# Patient Record
Sex: Female | Born: 1940
Health system: Southern US, Community
[De-identification: ages and names within clinical notes are randomized; demographics above are authoritative.]

## PROBLEM LIST (undated history)

## (undated) DIAGNOSIS — C801 Malignant (primary) neoplasm, unspecified: Secondary | ICD-10-CM

## (undated) DIAGNOSIS — E039 Hypothyroidism, unspecified: Secondary | ICD-10-CM

## (undated) DIAGNOSIS — G2581 Restless legs syndrome: Secondary | ICD-10-CM

## (undated) DIAGNOSIS — K219 Gastro-esophageal reflux disease without esophagitis: Secondary | ICD-10-CM

## (undated) DIAGNOSIS — E785 Hyperlipidemia, unspecified: Secondary | ICD-10-CM

## (undated) DIAGNOSIS — T7840XA Allergy, unspecified, initial encounter: Secondary | ICD-10-CM

## (undated) DIAGNOSIS — M722 Plantar fascial fibromatosis: Secondary | ICD-10-CM

## (undated) DIAGNOSIS — I10 Essential (primary) hypertension: Secondary | ICD-10-CM

## (undated) DIAGNOSIS — F411 Generalized anxiety disorder: Secondary | ICD-10-CM

## (undated) HISTORY — DX: Malignant (primary) neoplasm, unspecified: C80.1

## (undated) HISTORY — DX: Hyperlipidemia, unspecified: E78.5

## (undated) HISTORY — PX: OTHER SURGICAL HISTORY: SHX169

## (undated) HISTORY — DX: Generalized anxiety disorder: F41.1

## (undated) HISTORY — DX: Hypothyroidism, unspecified: E03.9

## (undated) HISTORY — DX: Allergy, unspecified, initial encounter: T78.40XA

## (undated) HISTORY — PX: WISDOM TOOTH EXTRACTION: SHX21

## (undated) HISTORY — DX: Gastro-esophageal reflux disease without esophagitis: K21.9

## (undated) HISTORY — DX: Plantar fascial fibromatosis: M72.2

## (undated) HISTORY — PX: ABDOMINAL HYSTERECTOMY: SHX81

## (undated) HISTORY — DX: Essential (primary) hypertension: I10

## (undated) HISTORY — DX: Restless legs syndrome: G25.81

## (undated) HISTORY — PX: REDUCTION MAMMAPLASTY: SUR839

## (undated) HISTORY — PX: BREAST SURGERY: SHX581

---

## 2003-12-12 ENCOUNTER — Ambulatory Visit (HOSPITAL_COMMUNITY): Admission: RE | Admit: 2003-12-12 | Discharge: 2003-12-12 | Payer: Self-pay | Admitting: Internal Medicine

## 2004-04-20 ENCOUNTER — Encounter: Admission: RE | Admit: 2004-04-20 | Discharge: 2004-04-20 | Payer: Self-pay | Admitting: Family Medicine

## 2004-05-07 ENCOUNTER — Ambulatory Visit: Payer: Self-pay | Admitting: Family Medicine

## 2004-11-03 ENCOUNTER — Ambulatory Visit: Payer: Self-pay | Admitting: Family Medicine

## 2005-03-09 ENCOUNTER — Ambulatory Visit: Payer: Self-pay | Admitting: Family Medicine

## 2005-03-09 ENCOUNTER — Ambulatory Visit: Payer: Self-pay | Admitting: Gastroenterology

## 2005-03-22 ENCOUNTER — Ambulatory Visit: Payer: Self-pay | Admitting: Gastroenterology

## 2005-03-30 ENCOUNTER — Ambulatory Visit: Payer: Self-pay | Admitting: Family Medicine

## 2005-08-26 ENCOUNTER — Ambulatory Visit: Payer: Self-pay | Admitting: Cardiology

## 2005-08-26 ENCOUNTER — Ambulatory Visit: Payer: Self-pay | Admitting: Family Medicine

## 2005-08-30 ENCOUNTER — Encounter: Admission: RE | Admit: 2005-08-30 | Discharge: 2005-08-30 | Payer: Self-pay | Admitting: Family Medicine

## 2005-09-01 ENCOUNTER — Ambulatory Visit: Payer: Self-pay | Admitting: Family Medicine

## 2005-11-12 ENCOUNTER — Encounter: Admission: RE | Admit: 2005-11-12 | Discharge: 2005-11-12 | Payer: Self-pay | Admitting: Family Medicine

## 2005-11-18 ENCOUNTER — Ambulatory Visit: Payer: Self-pay | Admitting: Family Medicine

## 2006-03-10 ENCOUNTER — Ambulatory Visit: Payer: Self-pay | Admitting: Family Medicine

## 2006-03-24 ENCOUNTER — Ambulatory Visit: Payer: Self-pay | Admitting: Family Medicine

## 2006-06-17 ENCOUNTER — Ambulatory Visit: Payer: Self-pay | Admitting: Family Medicine

## 2006-06-17 LAB — CONVERTED CEMR LAB
ALT: 35 units/L (ref 0–40)
AST: 31 units/L (ref 0–37)
Albumin: 4.2 g/dL (ref 3.5–5.2)
Alkaline Phosphatase: 49 units/L (ref 39–117)
BUN: 22 mg/dL (ref 6–23)
Basophils Absolute: 0.1 10*3/uL (ref 0.0–0.1)
Basophils Relative: 1.1 % — ABNORMAL HIGH (ref 0.0–1.0)
CO2: 26 meq/L (ref 19–32)
Calcium: 9.7 mg/dL (ref 8.4–10.5)
Chloride: 106 meq/L (ref 96–112)
Chol/HDL Ratio, serum: 3.3
Cholesterol: 161 mg/dL (ref 0–200)
Creatinine, Ser: 1.2 mg/dL (ref 0.4–1.2)
Eosinophil percent: 3.1 % (ref 0.0–5.0)
GFR calc non Af Amer: 48 mL/min
Glomerular Filtration Rate, Af Am: 58 mL/min/{1.73_m2}
Glucose, Bld: 109 mg/dL — ABNORMAL HIGH (ref 70–99)
HCT: 38.8 % (ref 36.0–46.0)
HDL: 48.5 mg/dL (ref >39.0)
Hemoglobin: 12.7 g/dL (ref 12.0–15.0)
LDL Cholesterol: 79 mg/dL (ref 0–99)
Lymphocytes Relative: 37.8 % (ref 12.0–46.0)
MCHC: 32.6 g/dL (ref 30.0–36.0)
MCV: 79.5 fL (ref 78.0–100.0)
Monocytes Absolute: 0.6 10*3/uL (ref 0.2–0.7)
Monocytes Relative: 11.1 % — ABNORMAL HIGH (ref 3.0–11.0)
Neutro Abs: 2.4 10*3/uL (ref 1.4–7.7)
Neutrophils Relative %: 46.9 % (ref 43.0–77.0)
Platelets: 261 10*3/uL (ref 150–400)
Potassium: 4.1 meq/L (ref 3.5–5.1)
RBC: 4.89 M/uL (ref 3.87–5.11)
RDW: 12.2 % (ref 11.5–14.6)
Sodium: 141 meq/L (ref 135–145)
TSH: 0.05 microintl units/mL — ABNORMAL LOW (ref 0.35–5.50)
Total Bilirubin: 0.8 mg/dL (ref 0.3–1.2)
Total Protein: 7.2 g/dL (ref 6.0–8.3)
Triglyceride fasting, serum: 169 mg/dL — ABNORMAL HIGH (ref 0–149)
VLDL: 34 mg/dL (ref 0–40)
WBC: 5.3 10*3/uL (ref 4.5–10.5)

## 2006-09-05 ENCOUNTER — Ambulatory Visit: Payer: Self-pay | Admitting: Family Medicine

## 2006-09-05 LAB — CONVERTED CEMR LAB
Free T4: 1 ng/dL (ref 0.6–1.6)
Hgb A1c MFr Bld: 5.6 % (ref 4.6–6.0)
T3, Free: 3.1 pg/mL (ref 2.3–4.2)
TSH: 0.12 microintl units/mL — ABNORMAL LOW (ref 0.35–5.50)

## 2006-11-14 ENCOUNTER — Encounter: Admission: RE | Admit: 2006-11-14 | Discharge: 2006-11-14 | Payer: Self-pay | Admitting: Family Medicine

## 2006-11-25 ENCOUNTER — Ambulatory Visit: Payer: Self-pay | Admitting: Family Medicine

## 2006-11-25 LAB — CONVERTED CEMR LAB: TSH: 0.4 microintl units/mL (ref 0.35–5.50)

## 2007-08-10 ENCOUNTER — Ambulatory Visit: Payer: Self-pay | Admitting: Family Medicine

## 2007-08-10 DIAGNOSIS — J45909 Unspecified asthma, uncomplicated: Secondary | ICD-10-CM | POA: Insufficient documentation

## 2007-08-10 DIAGNOSIS — F411 Generalized anxiety disorder: Secondary | ICD-10-CM | POA: Insufficient documentation

## 2007-08-10 DIAGNOSIS — E785 Hyperlipidemia, unspecified: Secondary | ICD-10-CM

## 2007-08-10 DIAGNOSIS — I10 Essential (primary) hypertension: Secondary | ICD-10-CM

## 2007-08-10 DIAGNOSIS — E039 Hypothyroidism, unspecified: Secondary | ICD-10-CM

## 2007-08-11 ENCOUNTER — Telehealth (INDEPENDENT_AMBULATORY_CARE_PROVIDER_SITE_OTHER): Payer: Self-pay | Admitting: *Deleted

## 2007-08-23 ENCOUNTER — Ambulatory Visit: Payer: Self-pay | Admitting: Family Medicine

## 2007-08-24 LAB — CONVERTED CEMR LAB
ALT: 26 units/L (ref 0–35)
AST: 24 units/L (ref 0–37)
Albumin: 4.1 g/dL (ref 3.5–5.2)
Alkaline Phosphatase: 48 units/L (ref 39–117)
BUN: 19 mg/dL (ref 6–23)
Basophils Absolute: 0 10*3/uL (ref 0.0–0.1)
Basophils Relative: 0.3 % (ref 0.0–1.0)
Bilirubin, Direct: 0.1 mg/dL (ref 0.0–0.3)
CO2: 30 meq/L (ref 19–32)
Calcium: 10.1 mg/dL (ref 8.4–10.5)
Chloride: 105 meq/L (ref 96–112)
Cholesterol: 152 mg/dL (ref 0–200)
Creatinine, Ser: 1.2 mg/dL (ref 0.4–1.2)
Eosinophils Absolute: 0.2 10*3/uL (ref 0.0–0.6)
Eosinophils Relative: 4.1 % (ref 0.0–5.0)
GFR calc Af Amer: 58 mL/min
GFR calc non Af Amer: 48 mL/min
Glucose, Bld: 91 mg/dL (ref 70–99)
HCT: 38.8 % (ref 36.0–46.0)
HDL: 44.1 mg/dL (ref >39.0)
Hemoglobin: 12.3 g/dL (ref 12.0–15.0)
LDL Cholesterol: 79 mg/dL (ref 0–99)
Lymphocytes Relative: 40.3 % (ref 12.0–46.0)
MCHC: 31.8 g/dL (ref 30.0–36.0)
MCV: 79.2 fL (ref 78.0–100.0)
Monocytes Absolute: 0.5 10*3/uL (ref 0.2–0.7)
Monocytes Relative: 10.3 % (ref 3.0–11.0)
Neutro Abs: 2.3 10*3/uL (ref 1.4–7.7)
Neutrophils Relative %: 45 % (ref 43.0–77.0)
Platelets: 232 10*3/uL (ref 150–400)
Potassium: 4.2 meq/L (ref 3.5–5.1)
RBC: 4.89 M/uL (ref 3.87–5.11)
RDW: 12 % (ref 11.5–14.6)
Sodium: 141 meq/L (ref 135–145)
TSH: 0.09 microintl units/mL — ABNORMAL LOW (ref 0.35–5.50)
Total Bilirubin: 0.8 mg/dL (ref 0.3–1.2)
Total CHOL/HDL Ratio: 3.4
Total Protein: 7.2 g/dL (ref 6.0–8.3)
Triglycerides: 147 mg/dL (ref 0–149)
VLDL: 29 mg/dL (ref 0–40)
WBC: 5.1 10*3/uL (ref 4.5–10.5)

## 2007-09-22 ENCOUNTER — Telehealth (INDEPENDENT_AMBULATORY_CARE_PROVIDER_SITE_OTHER): Payer: Self-pay | Admitting: *Deleted

## 2007-09-22 ENCOUNTER — Encounter (INDEPENDENT_AMBULATORY_CARE_PROVIDER_SITE_OTHER): Payer: Self-pay | Admitting: *Deleted

## 2007-09-28 ENCOUNTER — Ambulatory Visit: Payer: Self-pay | Admitting: Family Medicine

## 2007-09-28 DIAGNOSIS — G2581 Restless legs syndrome: Secondary | ICD-10-CM

## 2007-09-28 DIAGNOSIS — R002 Palpitations: Secondary | ICD-10-CM | POA: Insufficient documentation

## 2007-09-28 DIAGNOSIS — G4700 Insomnia, unspecified: Secondary | ICD-10-CM | POA: Insufficient documentation

## 2007-09-29 LAB — CONVERTED CEMR LAB: Vit D, 1,25-Dihydroxy: 40 (ref 30–89)

## 2007-10-01 LAB — CONVERTED CEMR LAB
BUN: 13 mg/dL (ref 6–23)
Basophils Absolute: 0.1 10*3/uL (ref 0.0–0.1)
Basophils Relative: 0.9 % (ref 0.0–1.0)
CO2: 27 meq/L (ref 19–32)
Calcium: 9.5 mg/dL (ref 8.4–10.5)
Chloride: 105 meq/L (ref 96–112)
Creatinine, Ser: 1.2 mg/dL (ref 0.4–1.2)
Eosinophils Absolute: 0.2 10*3/uL (ref 0.0–0.6)
Eosinophils Relative: 4 % (ref 0.0–5.0)
GFR calc Af Amer: 58 mL/min
GFR calc non Af Amer: 48 mL/min
Glucose, Bld: 92 mg/dL (ref 70–99)
HCT: 40.7 % (ref 36.0–46.0)
Hemoglobin: 13.3 g/dL (ref 12.0–15.0)
Lymphocytes Relative: 36.9 % (ref 12.0–46.0)
MCHC: 32.5 g/dL (ref 30.0–36.0)
MCV: 77.5 fL — ABNORMAL LOW (ref 78.0–100.0)
Monocytes Absolute: 0.6 10*3/uL (ref 0.2–0.7)
Monocytes Relative: 10.5 % (ref 3.0–11.0)
Neutro Abs: 2.6 10*3/uL (ref 1.4–7.7)
Neutrophils Relative %: 47.7 % (ref 43.0–77.0)
Platelets: 261 10*3/uL (ref 150–400)
Potassium: 3.8 meq/L (ref 3.5–5.1)
RBC: 5.25 M/uL — ABNORMAL HIGH (ref 3.87–5.11)
RDW: 12.1 % (ref 11.5–14.6)
Sodium: 139 meq/L (ref 135–145)
TSH: 0.65 microintl units/mL (ref 0.35–5.50)
WBC: 5.6 10*3/uL (ref 4.5–10.5)

## 2007-10-02 ENCOUNTER — Encounter (INDEPENDENT_AMBULATORY_CARE_PROVIDER_SITE_OTHER): Payer: Self-pay | Admitting: *Deleted

## 2007-11-08 ENCOUNTER — Telehealth (INDEPENDENT_AMBULATORY_CARE_PROVIDER_SITE_OTHER): Payer: Self-pay | Admitting: *Deleted

## 2007-11-21 ENCOUNTER — Encounter: Admission: RE | Admit: 2007-11-21 | Discharge: 2007-11-21 | Payer: Self-pay | Admitting: Family Medicine

## 2007-12-01 ENCOUNTER — Encounter (INDEPENDENT_AMBULATORY_CARE_PROVIDER_SITE_OTHER): Payer: Self-pay | Admitting: *Deleted

## 2008-03-18 ENCOUNTER — Telehealth: Payer: Self-pay | Admitting: Family Medicine

## 2008-04-23 ENCOUNTER — Telehealth (INDEPENDENT_AMBULATORY_CARE_PROVIDER_SITE_OTHER): Payer: Self-pay | Admitting: *Deleted

## 2008-05-10 ENCOUNTER — Ambulatory Visit: Payer: Self-pay | Admitting: Family Medicine

## 2008-05-24 ENCOUNTER — Telehealth: Payer: Self-pay | Admitting: Family Medicine

## 2008-08-15 ENCOUNTER — Ambulatory Visit: Payer: Self-pay | Admitting: Family Medicine

## 2008-08-29 ENCOUNTER — Ambulatory Visit: Payer: Self-pay | Admitting: Family Medicine

## 2008-08-29 DIAGNOSIS — R5381 Other malaise: Secondary | ICD-10-CM

## 2008-08-29 DIAGNOSIS — R5383 Other fatigue: Secondary | ICD-10-CM

## 2008-09-02 ENCOUNTER — Telehealth: Payer: Self-pay | Admitting: Family Medicine

## 2008-09-02 LAB — CONVERTED CEMR LAB
ALT: 34 units/L (ref 0–35)
AST: 30 units/L (ref 0–37)
Albumin: 4.1 g/dL (ref 3.5–5.2)
Alkaline Phosphatase: 54 units/L (ref 39–117)
BUN: 21 mg/dL (ref 6–23)
Basophils Absolute: 0.1 10*3/uL (ref 0.0–0.1)
Basophils Relative: 1.4 % (ref 0.0–3.0)
Bilirubin, Direct: 0.1 mg/dL (ref 0.0–0.3)
CO2: 28 meq/L (ref 19–32)
Calcium: 9.8 mg/dL (ref 8.4–10.5)
Chloride: 106 meq/L (ref 96–112)
Cholesterol: 227 mg/dL (ref 0–200)
Creatinine, Ser: 1.1 mg/dL (ref 0.4–1.2)
Direct LDL: 139.3 mg/dL
Eosinophils Absolute: 0.2 10*3/uL (ref 0.0–0.7)
Eosinophils Relative: 3.7 % (ref 0.0–5.0)
Folate: >20 ng/mL
GFR calc Af Amer: 64 mL/min
GFR calc non Af Amer: 53 mL/min
Glucose, Bld: 106 mg/dL — ABNORMAL HIGH (ref 70–99)
HCT: 38.3 % (ref 36.0–46.0)
HDL: 51.6 mg/dL (ref >39.0)
Hemoglobin: 12.9 g/dL (ref 12.0–15.0)
Lymphocytes Relative: 39.8 % (ref 12.0–46.0)
MCHC: 33.7 g/dL (ref 30.0–36.0)
MCV: 78.9 fL (ref 78.0–100.0)
Monocytes Absolute: 0.5 10*3/uL (ref 0.1–1.0)
Monocytes Relative: 8.9 % (ref 3.0–12.0)
Neutro Abs: 2.6 10*3/uL (ref 1.4–7.7)
Neutrophils Relative %: 46.2 % (ref 43.0–77.0)
Platelets: 229 10*3/uL (ref 150–400)
Potassium: 4 meq/L (ref 3.5–5.1)
RBC: 4.85 M/uL (ref 3.87–5.11)
RDW: 12.3 % (ref 11.5–14.6)
Sodium: 142 meq/L (ref 135–145)
TSH: 23.95 microintl units/mL — ABNORMAL HIGH (ref 0.35–5.50)
Total Bilirubin: 1 mg/dL (ref 0.3–1.2)
Total CHOL/HDL Ratio: 4.4
Total Protein: 7.4 g/dL (ref 6.0–8.3)
Triglycerides: 231 mg/dL (ref 0–149)
VLDL: 46 mg/dL — ABNORMAL HIGH (ref 0–40)
Vit D, 25-Hydroxy: 29 ng/mL — ABNORMAL LOW (ref 30–89)
Vitamin B-12: 542 pg/mL (ref 211–911)
WBC: 5.6 10*3/uL (ref 4.5–10.5)

## 2008-09-03 ENCOUNTER — Encounter (INDEPENDENT_AMBULATORY_CARE_PROVIDER_SITE_OTHER): Payer: Self-pay | Admitting: *Deleted

## 2008-09-04 ENCOUNTER — Telehealth (INDEPENDENT_AMBULATORY_CARE_PROVIDER_SITE_OTHER): Payer: Self-pay | Admitting: *Deleted

## 2008-09-19 ENCOUNTER — Ambulatory Visit: Payer: Self-pay | Admitting: Family Medicine

## 2008-11-21 ENCOUNTER — Encounter: Admission: RE | Admit: 2008-11-21 | Discharge: 2008-11-21 | Payer: Self-pay | Admitting: Family Medicine

## 2008-12-10 ENCOUNTER — Encounter: Payer: Self-pay | Admitting: Family Medicine

## 2008-12-10 ENCOUNTER — Other Ambulatory Visit: Admission: RE | Admit: 2008-12-10 | Discharge: 2008-12-10 | Payer: Self-pay | Admitting: Family Medicine

## 2008-12-10 ENCOUNTER — Ambulatory Visit: Payer: Self-pay | Admitting: Family Medicine

## 2008-12-10 ENCOUNTER — Telehealth: Payer: Self-pay | Admitting: Family Medicine

## 2008-12-10 DIAGNOSIS — R079 Chest pain, unspecified: Secondary | ICD-10-CM

## 2008-12-12 ENCOUNTER — Telehealth (INDEPENDENT_AMBULATORY_CARE_PROVIDER_SITE_OTHER): Payer: Self-pay | Admitting: Radiology

## 2008-12-16 ENCOUNTER — Ambulatory Visit: Payer: Self-pay

## 2008-12-16 ENCOUNTER — Encounter: Payer: Self-pay | Admitting: Family Medicine

## 2008-12-16 ENCOUNTER — Encounter (INDEPENDENT_AMBULATORY_CARE_PROVIDER_SITE_OTHER): Payer: Self-pay | Admitting: *Deleted

## 2008-12-17 ENCOUNTER — Encounter (INDEPENDENT_AMBULATORY_CARE_PROVIDER_SITE_OTHER): Payer: Self-pay | Admitting: *Deleted

## 2008-12-17 ENCOUNTER — Ambulatory Visit: Payer: Self-pay | Admitting: Family Medicine

## 2008-12-17 LAB — CONVERTED CEMR LAB
OCCULT 1: NEGATIVE
OCCULT 2: NEGATIVE
OCCULT 3: NEGATIVE

## 2008-12-19 ENCOUNTER — Encounter (INDEPENDENT_AMBULATORY_CARE_PROVIDER_SITE_OTHER): Payer: Self-pay | Admitting: *Deleted

## 2009-04-04 ENCOUNTER — Ambulatory Visit: Payer: Self-pay | Admitting: Family Medicine

## 2009-04-08 ENCOUNTER — Ambulatory Visit: Payer: Self-pay | Admitting: Family Medicine

## 2009-04-14 ENCOUNTER — Telehealth: Payer: Self-pay | Admitting: Family Medicine

## 2009-04-14 LAB — CONVERTED CEMR LAB
ALT: 42 units/L — ABNORMAL HIGH (ref 0–35)
AST: 34 units/L (ref 0–37)
Albumin: 4.1 g/dL (ref 3.5–5.2)
Alkaline Phosphatase: 54 units/L (ref 39–117)
BUN: 23 mg/dL (ref 6–23)
Bilirubin, Direct: 0.1 mg/dL (ref 0.0–0.3)
CO2: 30 meq/L (ref 19–32)
Calcium: 9.4 mg/dL (ref 8.4–10.5)
Chloride: 106 meq/L (ref 96–112)
Cholesterol: 200 mg/dL (ref 0–200)
Creatinine, Ser: 1.3 mg/dL — ABNORMAL HIGH (ref 0.4–1.2)
Direct LDL: 136.8 mg/dL
GFR calc non Af Amer: 43.42 mL/min (ref >60)
Glucose, Bld: 108 mg/dL — ABNORMAL HIGH (ref 70–99)
HDL: 43.9 mg/dL (ref >39.00)
Hgb A1c MFr Bld: 5.7 % (ref 4.6–6.5)
Potassium: 4 meq/L (ref 3.5–5.1)
Sodium: 141 meq/L (ref 135–145)
Total Bilirubin: 0.8 mg/dL (ref 0.3–1.2)
Total CHOL/HDL Ratio: 5
Total Protein: 7.2 g/dL (ref 6.0–8.3)
Triglycerides: 252 mg/dL — ABNORMAL HIGH (ref 0.0–149.0)
VLDL: 50.4 mg/dL — ABNORMAL HIGH (ref 0.0–40.0)

## 2009-05-05 ENCOUNTER — Encounter: Payer: Self-pay | Admitting: Family Medicine

## 2009-05-05 ENCOUNTER — Encounter: Admission: RE | Admit: 2009-05-05 | Discharge: 2009-07-02 | Payer: Self-pay | Admitting: Family Medicine

## 2009-07-09 ENCOUNTER — Telehealth (INDEPENDENT_AMBULATORY_CARE_PROVIDER_SITE_OTHER): Payer: Self-pay | Admitting: *Deleted

## 2009-07-25 ENCOUNTER — Ambulatory Visit: Payer: Self-pay | Admitting: Family Medicine

## 2009-07-29 ENCOUNTER — Telehealth (INDEPENDENT_AMBULATORY_CARE_PROVIDER_SITE_OTHER): Payer: Self-pay | Admitting: *Deleted

## 2009-08-08 ENCOUNTER — Telehealth: Payer: Self-pay | Admitting: Family Medicine

## 2009-09-04 ENCOUNTER — Ambulatory Visit: Payer: Self-pay | Admitting: Family Medicine

## 2009-09-04 DIAGNOSIS — K219 Gastro-esophageal reflux disease without esophagitis: Secondary | ICD-10-CM | POA: Insufficient documentation

## 2009-09-04 DIAGNOSIS — E559 Vitamin D deficiency, unspecified: Secondary | ICD-10-CM | POA: Insufficient documentation

## 2009-09-10 ENCOUNTER — Ambulatory Visit: Payer: Self-pay | Admitting: Family Medicine

## 2009-09-12 LAB — CONVERTED CEMR LAB
ALT: 36 units/L — ABNORMAL HIGH (ref 0–35)
AST: 35 units/L (ref 0–37)
Albumin: 4.1 g/dL (ref 3.5–5.2)
Alkaline Phosphatase: 52 units/L (ref 39–117)
BUN: 25 mg/dL — ABNORMAL HIGH (ref 6–23)
Bilirubin, Direct: 0.1 mg/dL (ref 0.0–0.3)
CO2: 27 meq/L (ref 19–32)
Calcium: 9.8 mg/dL (ref 8.4–10.5)
Chloride: 111 meq/L (ref 96–112)
Cholesterol: 174 mg/dL (ref 0–200)
Creatinine, Ser: 1.1 mg/dL (ref 0.4–1.2)
GFR calc non Af Amer: 52.58 mL/min (ref >60)
Glucose, Bld: 92 mg/dL (ref 70–99)
HDL: 55 mg/dL (ref >39.00)
LDL Cholesterol: 81 mg/dL (ref 0–99)
Potassium: 4 meq/L (ref 3.5–5.1)
Sodium: 143 meq/L (ref 135–145)
TSH: 2.84 microintl units/mL (ref 0.35–5.50)
Total Bilirubin: 0.9 mg/dL (ref 0.3–1.2)
Total CHOL/HDL Ratio: 3
Total Protein: 7.4 g/dL (ref 6.0–8.3)
Triglycerides: 189 mg/dL — ABNORMAL HIGH (ref 0.0–149.0)
VLDL: 37.8 mg/dL (ref 0.0–40.0)
Vit D, 25-Hydroxy: 40 ng/mL (ref 30–89)

## 2009-09-17 ENCOUNTER — Telehealth (INDEPENDENT_AMBULATORY_CARE_PROVIDER_SITE_OTHER): Payer: Self-pay | Admitting: *Deleted

## 2009-09-22 ENCOUNTER — Telehealth: Payer: Self-pay | Admitting: Family Medicine

## 2009-10-23 ENCOUNTER — Ambulatory Visit: Payer: Self-pay | Admitting: Family Medicine

## 2009-10-29 ENCOUNTER — Ambulatory Visit: Payer: Self-pay | Admitting: Family Medicine

## 2009-11-25 ENCOUNTER — Encounter: Admission: RE | Admit: 2009-11-25 | Discharge: 2009-11-25 | Payer: Self-pay | Admitting: Family Medicine

## 2009-12-29 ENCOUNTER — Ambulatory Visit: Payer: Self-pay | Admitting: Family Medicine

## 2009-12-29 ENCOUNTER — Telehealth: Payer: Self-pay | Admitting: Family Medicine

## 2010-01-02 ENCOUNTER — Ambulatory Visit: Payer: Self-pay | Admitting: Family Medicine

## 2010-01-02 ENCOUNTER — Telehealth (INDEPENDENT_AMBULATORY_CARE_PROVIDER_SITE_OTHER): Payer: Self-pay | Admitting: *Deleted

## 2010-01-02 DIAGNOSIS — R42 Dizziness and giddiness: Secondary | ICD-10-CM

## 2010-01-02 LAB — CONVERTED CEMR LAB
Basophils Absolute: 0.1 10*3/uL (ref 0.0–0.1)
Basophils Relative: 1 % (ref 0.0–3.0)
Eosinophils Absolute: 0.1 10*3/uL (ref 0.0–0.7)
Eosinophils Relative: 1.5 % (ref 0.0–5.0)
HCT: 42.7 % (ref 36.0–46.0)
Hemoglobin: 14.2 g/dL (ref 12.0–15.0)
Lymphocytes Relative: 35.5 % (ref 12.0–46.0)
Lymphs Abs: 2.6 10*3/uL (ref 0.7–4.0)
MCHC: 33.3 g/dL (ref 30.0–36.0)
MCV: 78.9 fL (ref 78.0–100.0)
Monocytes Absolute: 1 10*3/uL (ref 0.1–1.0)
Monocytes Relative: 14.5 % — ABNORMAL HIGH (ref 3.0–12.0)
Neutro Abs: 3.4 10*3/uL (ref 1.4–7.7)
Neutrophils Relative %: 47.5 % (ref 43.0–77.0)
Platelets: 227 10*3/uL (ref 150.0–400.0)
RBC: 5.41 M/uL — ABNORMAL HIGH (ref 3.87–5.11)
RDW: 13.6 % (ref 11.5–14.6)
TSH: 0.2 microintl units/mL — ABNORMAL LOW (ref 0.35–5.50)
WBC: 7.2 10*3/uL (ref 4.5–10.5)

## 2010-01-06 ENCOUNTER — Encounter: Payer: Self-pay | Admitting: Family Medicine

## 2010-01-09 ENCOUNTER — Ambulatory Visit: Payer: Self-pay | Admitting: Family Medicine

## 2010-01-09 DIAGNOSIS — IMO0001 Reserved for inherently not codable concepts without codable children: Secondary | ICD-10-CM | POA: Insufficient documentation

## 2010-01-13 ENCOUNTER — Telehealth (INDEPENDENT_AMBULATORY_CARE_PROVIDER_SITE_OTHER): Payer: Self-pay | Admitting: *Deleted

## 2010-01-13 LAB — CONVERTED CEMR LAB
ALT: 45 units/L — ABNORMAL HIGH (ref 0–35)
AST: 37 units/L (ref 0–37)
Albumin: 4.5 g/dL (ref 3.5–5.2)
Alkaline Phosphatase: 56 units/L (ref 39–117)
Anti Nuclear Antibody(ANA): NEGATIVE
BUN: 29 mg/dL — ABNORMAL HIGH (ref 6–23)
Bilirubin, Direct: 0.1 mg/dL (ref 0.0–0.3)
CO2: 21 meq/L (ref 19–32)
Calcium: 9.6 mg/dL (ref 8.4–10.5)
Chloride: 105 meq/L (ref 96–112)
Creatinine, Ser: 1.25 mg/dL — ABNORMAL HIGH (ref 0.40–1.20)
Glucose, Bld: 99 mg/dL (ref 70–99)
Indirect Bilirubin: 0.6 mg/dL (ref 0.0–0.9)
Potassium: 4.8 meq/L (ref 3.5–5.3)
Rheumatoid fact SerPl-aCnc: 25 intl units/mL — ABNORMAL HIGH (ref 0–20)
Sed Rate: 6 mm/hr (ref 0–22)
Sodium: 138 meq/L (ref 135–145)
Total Bilirubin: 0.7 mg/dL (ref 0.3–1.2)
Total CK: 115 units/L (ref 7–177)
Total Protein: 7.3 g/dL (ref 6.0–8.3)
Vit D, 25-Hydroxy: 46 ng/mL (ref 30–89)

## 2010-03-05 ENCOUNTER — Ambulatory Visit: Payer: Self-pay | Admitting: Family Medicine

## 2010-03-06 ENCOUNTER — Encounter (INDEPENDENT_AMBULATORY_CARE_PROVIDER_SITE_OTHER): Payer: Self-pay | Admitting: *Deleted

## 2010-03-11 ENCOUNTER — Encounter: Payer: Self-pay | Admitting: Family Medicine

## 2010-03-25 ENCOUNTER — Encounter: Payer: Self-pay | Admitting: Gastroenterology

## 2010-04-09 ENCOUNTER — Encounter (INDEPENDENT_AMBULATORY_CARE_PROVIDER_SITE_OTHER): Payer: Self-pay | Admitting: *Deleted

## 2010-04-10 ENCOUNTER — Ambulatory Visit: Payer: Self-pay | Admitting: Gastroenterology

## 2010-04-22 ENCOUNTER — Ambulatory Visit: Payer: Self-pay | Admitting: Gastroenterology

## 2010-04-22 HISTORY — PX: COLOSTOMY: SHX63

## 2010-04-22 LAB — HM COLONOSCOPY

## 2010-05-05 ENCOUNTER — Ambulatory Visit: Payer: Self-pay | Admitting: Family Medicine

## 2010-05-08 LAB — CONVERTED CEMR LAB
ALT: 28 units/L (ref 0–35)
AST: 30 units/L (ref 0–37)
Albumin: 4 g/dL (ref 3.5–5.2)
Alkaline Phosphatase: 68 units/L (ref 39–117)
Bilirubin, Direct: 0.2 mg/dL (ref 0.0–0.3)
Cholesterol: 200 mg/dL (ref 0–200)
HDL: 54.3 mg/dL (ref >39.00)
LDL Cholesterol: 115 mg/dL — ABNORMAL HIGH (ref 0–99)
TSH: 0.45 microintl units/mL (ref 0.35–5.50)
Total Bilirubin: 0.8 mg/dL (ref 0.3–1.2)
Total CHOL/HDL Ratio: 4
Total Protein: 7 g/dL (ref 6.0–8.3)
Triglycerides: 153 mg/dL — ABNORMAL HIGH (ref 0.0–149.0)
VLDL: 30.6 mg/dL (ref 0.0–40.0)

## 2010-07-22 ENCOUNTER — Telehealth (INDEPENDENT_AMBULATORY_CARE_PROVIDER_SITE_OTHER): Payer: Self-pay | Admitting: *Deleted

## 2010-07-26 ENCOUNTER — Encounter: Payer: Self-pay | Admitting: Internal Medicine

## 2010-08-02 LAB — CONVERTED CEMR LAB
ALT: 43 units/L — ABNORMAL HIGH (ref 0–35)
ALT: 78 units/L — ABNORMAL HIGH (ref 0–35)
AST: 37 units/L (ref 0–37)
AST: 61 units/L — ABNORMAL HIGH (ref 0–37)
Albumin: 4.4 g/dL (ref 3.5–5.2)
Albumin: 4.5 g/dL (ref 3.5–5.2)
Alkaline Phosphatase: 54 units/L (ref 39–117)
Alkaline Phosphatase: 59 units/L (ref 39–117)
BUN: 26 mg/dL — ABNORMAL HIGH (ref 6–23)
BUN: 35 mg/dL — ABNORMAL HIGH (ref 6–23)
Basophils Absolute: 0 10*3/uL (ref 0.0–0.1)
Basophils Relative: 0.5 % (ref 0.0–3.0)
Bilirubin, Direct: 0.1 mg/dL (ref 0.0–0.3)
Bilirubin, Direct: 0.2 mg/dL (ref 0.0–0.3)
CO2: 25 meq/L (ref 19–32)
CO2: 29 meq/L (ref 19–32)
CRP, High Sensitivity: <1 — ABNORMAL LOW (ref 0.00–5.00)
Calcium: 9.4 mg/dL (ref 8.4–10.5)
Calcium: 9.9 mg/dL (ref 8.4–10.5)
Chloride: 109 meq/L (ref 96–112)
Chloride: 109 meq/L (ref 96–112)
Cholesterol: 218 mg/dL — ABNORMAL HIGH (ref 0–200)
Creatinine, Ser: 1.2 mg/dL (ref 0.4–1.2)
Creatinine, Ser: 1.4 mg/dL — ABNORMAL HIGH (ref 0.4–1.2)
Direct LDL: 137.9 mg/dL
Eosinophils Absolute: 0.2 10*3/uL (ref 0.0–0.7)
Eosinophils Relative: 3.1 % (ref 0.0–5.0)
Free T4: 1 ng/dL (ref 0.6–1.6)
GFR calc non Af Amer: 40.78 mL/min (ref >60)
GFR calc non Af Amer: 47.67 mL/min (ref >60)
Glucose, Bld: 105 mg/dL — ABNORMAL HIGH (ref 70–99)
Glucose, Bld: 113 mg/dL — ABNORMAL HIGH (ref 70–99)
HCT: 38.8 % (ref 36.0–46.0)
HDL: 58.5 mg/dL (ref >39.00)
Hemoglobin: 13 g/dL (ref 12.0–15.0)
Lymphocytes Relative: 38.2 % (ref 12.0–46.0)
Lymphs Abs: 2.4 10*3/uL (ref 0.7–4.0)
MCHC: 33.5 g/dL (ref 30.0–36.0)
MCV: 80.1 fL (ref 78.0–100.0)
Monocytes Absolute: 0.7 10*3/uL (ref 0.1–1.0)
Monocytes Relative: 10.4 % (ref 3.0–12.0)
Neutro Abs: 3.1 10*3/uL (ref 1.4–7.7)
Neutrophils Relative %: 47.8 % (ref 43.0–77.0)
Platelets: 266 10*3/uL (ref 150.0–400.0)
Potassium: 4.1 meq/L (ref 3.5–5.1)
Potassium: 4.8 meq/L (ref 3.5–5.1)
RBC: 4.85 M/uL (ref 3.87–5.11)
RDW: 12.1 % (ref 11.5–14.6)
Sodium: 141 meq/L (ref 135–145)
Sodium: 142 meq/L (ref 135–145)
T3, Free: 2.7 pg/mL (ref 2.3–4.2)
TSH: 2.86 microintl units/mL (ref 0.35–5.50)
Total Bilirubin: 0.9 mg/dL (ref 0.3–1.2)
Total Bilirubin: 1.1 mg/dL (ref 0.3–1.2)
Total CHOL/HDL Ratio: 4
Total CK: 129 units/L (ref 7–177)
Total Protein: 7.7 g/dL (ref 6.0–8.3)
Total Protein: 8 g/dL (ref 6.0–8.3)
Triglycerides: 248 mg/dL — ABNORMAL HIGH (ref 0.0–149.0)
VLDL: 49.6 mg/dL — ABNORMAL HIGH (ref 0.0–40.0)
WBC: 6.4 10*3/uL (ref 4.5–10.5)

## 2010-08-04 NOTE — Miscellaneous (Signed)
Summary: [facility]/prep  Clinical Lists Changes  Medications: Added new medication of MOVIPREP 100 GM  SOLR (PEG-KCL-NACL-NASULF-NA ASC-C) As per prep instructions. - Signed Rx of MOVIPREP 100 GM  SOLR (PEG-KCL-NACL-NASULF-NA ASC-C) As per prep instructions.;  #1 x 0;  Signed;  Entered by: Wyona Almas RN;  Authorized by: Louis Meckel MD;  Method used: Electronically to [facility] [address] [place], [place]  [address], Ph: [phone] or [phone], Fax: [phone] Observations: Added new observation of ALLERGY REV: Done (04/10/2010 8:53)    Prescriptions: MOVIPREP 100 GM  SOLR (PEG-KCL-NACL-NASULF-NA ASC-C) As per prep instructions.  #1 x 0   Entered by:   Wyona Almas RN   Authorized by:   Louis Meckel MD   Signed by:   Wyona Almas RN on 04/10/2010   Method used:   Electronically to        [facility] (retail)       [address]       [place], [place]  [address]       Ph: [phone] or [phone]       Fax: [phone]   RxID:   [id number]

## 2010-08-04 NOTE — Progress Notes (Signed)
Summary: diovan refill   Phone Note Refill Request Message from:  Patient on July 09, 2009 4:13 PM  Refills Requested: Medication #1:  DIOVAN 320 MG TABS 1 by mouth once daily [facility]   Initial call taken by: Doristine Devoid,  July 09, 2009 4:13 PM    Prescriptions: DIOVAN 320 MG TABS (VALSARTAN) 1 by mouth once daily  #90 x 1   Entered by:   Doristine Devoid   Authorized by:   Loreen Freud DO   Signed by:   Doristine Devoid on 07/09/2009   Method used:   Faxed to ...       [facility] Environmental education officer)       [address] [address]       [place], [place]  [address]       Ph: [phone]       Fax: [phone]   RxID:   [id number]

## 2010-08-04 NOTE — Medication Information (Signed)
Summary: Fax Origin Clarification/[facility]  Fax Origin Clarification/[facility]   Imported By: Lanelle Bal 04/02/2010 11:17:30  _____________________________________________________________________  External Attachment:    Type:   Image     Comment:   External Document

## 2010-08-04 NOTE — Letter (Signed)
Summary: Pre Visit Letter Revised  [facility]  [address] [place], [place] [address]   Phone: [phone]  Fax: [phone]        03/06/2010 MRN: [id number] [facility] [address] [place], [place]  [address]             Procedure Date:  04-22-10   Welcome to the Gastroenterology Division at [facility].    You are scheduled to see a nurse for your pre-procedure visit on 04-08-10 at 1:30p.m. on the 3rd floor at [facility], [address]  We ask that you try to arrive at our office 15 minutes prior to your appointment time to allow for check-in.  Please take a minute to review the attached form.  If you answer "Yes" to one or more of the questions on the first page, we ask that you call the person listed at your earliest opportunity.  If you answer "No" to all of the questions, please complete the rest of the form and bring it to your appointment.    Your nurse visit will consist of discussing your medical and surgical history, your immediate family medical history, and your medications.   If you are unable to list all of your medications on the form, please bring the medication bottles to your appointment and we will list them.  We will need to be aware of both prescribed and over the counter drugs.  We will need to know exact dosage information as well.    Please be prepared to read and sign documents such as consent forms, a financial agreement, and acknowledgement forms.  If necessary, and with your consent, a friend or relative is welcome to sit-in on the nurse visit with you.  Please bring your insurance card so that we may make a copy of it.  If your insurance requires a referral to see a specialist, please bring your referral form from your primary care physician.  No co-pay is required for this nurse visit.     If you cannot keep your appointment, please call [phone] to cancel or reschedule prior to your appointment date.  This  allows [place] the opportunity to schedule an appointment for another patient in need of care.    Thank you for choosing [facility] for your medical needs.  We appreciate the opportunity to care for you.  Please visit [place] at our website  to learn more about our practice.  Sincerely, The Gastroenterology Division

## 2010-08-04 NOTE — Progress Notes (Signed)
Summary: Refill Request  Phone Note Refill Request Message from:  Pharmacy on [facility] Fax #: [phone]  Refills Requested: Medication #1:  SYNTHROID 125 MCG TABS 1 by mouth once daily [BMN]   Dosage confirmed as above?Dosage Confirmed   Brand Name Necessary? No   Supply Requested: 3 months   Notes: 3 refills Wants generic!  Next Appointment Scheduled: 4.21.11 Initial call taken by: Harold Barban,  September 22, 2009 1:24 PM  Follow-up for Phone Call        didn't we do this this am? Follow-up by: Loreen Freud DO,  September 22, 2009 1:29 PM  Additional Follow-up for Phone Call Additional follow up Details #1::        yes, see prior phone note. Army Fossa CMA  September 22, 2009 1:33 PM

## 2010-08-04 NOTE — Progress Notes (Signed)
Summary: labs   Phone Note Outgoing Call   Call placed by: Doristine Devoid,  January 02, 2010 4:56 PM Call placed to: Patient Summary of Call: pt's thyroid dose is too high.  needs to drop down to 112 micrograms daily and recheck in 4-6 weeks    New/Updated Medications: SYNTHROID 112 MCG TABS (LEVOTHYROXINE SODIUM) take one tablet daily Prescriptions: SYNTHROID 112 MCG TABS (LEVOTHYROXINE SODIUM) take one tablet daily  #30 x 1   Entered by:   Doristine Devoid   Authorized by:   Neena Rhymes MD   Signed by:   Doristine Devoid on 01/02/2010   Method used:   Electronically to        [facility] (retail)       [address]       [place], [place]  [address]       Ph: [phone] or [phone]       Fax: [phone]   RxID:   [id number]

## 2010-08-04 NOTE — Assessment & Plan Note (Signed)
Summary: for bp and med refill//ph   Vital Signs:  Patient profile:   70 year old female Weight:      206.13 pounds Pulse rate:   85 / minute Pulse rhythm:   regular BP sitting:   148 / 90  (left arm) Cuff size:   large  Vitals Entered By: Army Fossa CMA (September 04, 2009 10:37 AM) CC: Pt here for BP check and med refills, Heartburn   History of Present Illness:  Hypertension follow-up      This is a 70 year old woman who presents for Hypertension follow-up.  The patient denies lightheadedness, urinary frequency, headaches, edema, impotence, rash, and fatigue.  The patient denies the following associated symptoms: chest pain, chest pressure, exercise intolerance, dyspnea, palpitations, syncope, leg edema, and pedal edema.  Compliance with medications (by patient report) has been near 100%.  The patient reports that dietary compliance has been good.  Adjunctive measures currently used by the patient include salt restriction.    Heartburn      The patient also presents with Heartburn.  The symptoms began 4 weeks ago.  The patient complains of acid reflux and sour taste in mouth, but denies epigastric pain, chest pain, trouble swallowing, weight loss, and weight gain.  The patient denies the following alarm features: melena, dysphagia, hematemesis, vomiting, involuntary weight loss >5%, and history of anemia.  Symptoms are worse with spicy foods.  The patient has found the following treatments to be effective: an antacid.    Allergies: 1)  ! Penicillin 2)  ! Hydrochlorothiazide 3)  ! Lisinopril 4)  ! Vicodin  Physical Exam  General:  Well-developed,well-nourished,in no acute distress; alert,appropriate and cooperative throughout examination Lungs:  Normal respiratory effort, chest expands symmetrically. Lungs are clear to auscultation, no crackles or wheezes. Heart:  normal rate and no murmur.   Abdomen:  Bowel sounds positive,abdomen soft and non-tender without masses,  organomegaly or hernias noted.bowel sounds hypoactive.   Extremities:  No clubbing, cyanosis, edema, or deformity noted with normal full range of motion of all joints.   Psych:  Oriented X3 and normally interactive.     Impression & Recommendations:  Problem # 1:  HYPERTENSION (ICD-401.9)  Her updated medication list for this problem includes:    Diovan 320 Mg Tabs (Valsartan) .Marland Kitchen... 1 by mouth once daily    Bystolic 10 Mg Tabs (Nebivolol hcl) .Marland Kitchen... 1 by mouth once daily  Orders: Venipuncture ([address])  BP today: 148/90 Prior BP: 140/90 (07/25/2009)  Labs Reviewed: K+: 4.0 (04/08/2009) Creat: : 1.3 (04/08/2009)   Chol: 200 (04/08/2009)   HDL: 43.90 (04/08/2009)   LDL: DEL (08/29/2008)   TG: 252.0 (04/08/2009)  Problem # 2:  GERD (ICD-530.81) REFER TO GI IF NO BETTER Her updated medication list for this problem includes:    Omeprazole 20 Mg Cpdr (Omeprazole) .Marland Kitchen... 1 by mouth qam  Diagnostics Reviewed:  Discussed lifestyle modifications, diet, antacids/medications, and preventive measures. Handout provided.   Problem # 3:  HYPOTHYROIDISM (ICD-244.9)  Her updated medication list for this problem includes:    Synthroid 125 Mcg Tabs (Levothyroxine sodium) .Marland Kitchen... 1 by mouth once daily  Orders: Venipuncture ([address])  Labs Reviewed: TSH: 2.86 (12/10/2008)    HgBA1c: 5.7 (04/08/2009) Chol: 200 (04/08/2009)   HDL: 43.90 (04/08/2009)   LDL: DEL (08/29/2008)   TG: 252.0 (04/08/2009)  Problem # 4:  HYPERLIPIDEMIA (ICD-272.4)  Her updated medication list for this problem includes:    Zocor 40 Mg Tabs (Simvastatin) .Marland Kitchen... Take one tablet  at bedtime  Orders: Venipuncture ([address])  Labs Reviewed: SGOT: 34 (04/08/2009)   SGPT: 42 (04/08/2009)   HDL:43.90 (04/08/2009), 58.50 (12/10/2008)  LDL:DEL (08/29/2008), 79 (56/21/3086)  Chol:200 (04/08/2009), 218 (12/10/2008)  Trig:252.0 (04/08/2009), 248.0 (12/10/2008)  Complete Medication List: 1)  Synthroid 125 Mcg Tabs (Levothyroxine sodium)  .Marland Kitchen.. 1 by mouth once daily 2)  Zocor 40 Mg Tabs (Simvastatin) .... Take one tablet at bedtime 3)  Diovan 320 Mg Tabs (Valsartan) .Marland Kitchen.. 1 by mouth once daily 4)  Alprazolam 0.25 Mg Tabs (Alprazolam) .Marland Kitchen.. 1 by mouth once daily prn 5)  Vitamin D 57846 Unit Caps (Ergocalciferol) .... Take one tablet each week. 6)  Bystolic 10 Mg Tabs (Nebivolol hcl) .Marland Kitchen.. 1 by mouth once daily 7)  Omeprazole 20 Mg Cpdr (Omeprazole) .Marland Kitchen.. 1 by mouth qam  Prescriptions: OMEPRAZOLE 20 MG CPDR (OMEPRAZOLE) 1 by mouth QAM  #90 x 3   Entered and Authorized by:   Loreen Freud DO   Signed by:   Loreen Freud DO on 09/04/2009   Method used:   Faxed to ...       [facility] Environmental education officer)       [address] [address]       [place], [place]  [address]       Ph: [phone]       Fax: [phone]   RxID:   [id number] SYNTHROID 125 MCG TABS (LEVOTHYROXINE SODIUM) 1 by mouth once daily Brand medically necessary #90 x 3   Entered and Authorized by:   Loreen Freud DO   Signed by:   Loreen Freud DO on 09/04/2009   Method used:   Faxed to ...       [facility] Environmental education officer)       [address] [address]       [place], [place]  [address]       Ph: [phone]       Fax: [phone]   RxID:   [id number] BYSTOLIC 10 MG TABS (NEBIVOLOL HCL) 1 by mouth once daily  #90 x 3   Entered and Authorized by:   Loreen Freud DO   Signed by:   Loreen Freud DO on 09/04/2009   Method used:   Faxed to ...       [facility] Environmental education officer)       [address] [address]       [place], [place]  [address]       Ph: [phone]       Fax: [phone]   RxID:   [id number]

## 2010-08-04 NOTE — Letter (Signed)
Summary: [facility] Instructions  [facility]  [address] [place], [place] [address]   Phone: [phone]  Fax: [phone]       Hannah Morrow    11-17-1940    MRN: [id number]        Procedure Day Dorna Bloom:  [facility]  04/22/10     Arrival Time:  9:30AM     Procedure Time:  10:30AM     Location of Procedure:                    _X _  [facility] (4th Floor)                       PREPARATION FOR COLONOSCOPY WITH MOVIPREP   Starting 5 days prior to your procedure 04/17/10 do not eat nuts, seeds, popcorn, corn, beans, peas,  salads, or any raw vegetables.  Do not take any fiber supplements (e.g. Metamucil, Citrucel, and Benefiber).  THE DAY BEFORE YOUR PROCEDURE         DATE: 05/22/10  DAY: TUESDAY  1.  Drink clear liquids the entire day-NO SOLID FOOD  2.  Do not drink anything colored red or purple.  Avoid juices with pulp.  No orange juice.  3.  Drink at least 64 oz. (8 glasses) of fluid/clear liquids during the day to prevent dehydration and help the prep work efficiently.  CLEAR LIQUIDS INCLUDE: Water Jello Ice Popsicles Tea (sugar ok, no milk/cream) Powdered fruit flavored drinks Coffee (sugar ok, no milk/cream) Gatorade Juice: apple, white grape, white cranberry  Lemonade Clear bullion, consomm, broth Carbonated beverages (any kind) Strained chicken noodle soup Hard Candy                             4.  In the morning, mix first dose of MoviPrep solution:    Empty 1 Pouch A and 1 Pouch B into the disposable container    Add lukewarm drinking water to the top line of the container. Mix to dissolve    Refrigerate (mixed solution should be used within 24 hrs)  5.  Begin drinking the prep at 5:00 p.m. The MoviPrep container is divided by 4 marks.   Every 15 minutes drink the solution down to the next mark (approximately 8 oz) until the full liter is complete.   6.  Follow completed prep with 16 oz of clear liquid of your choice  (Nothing red or purple).  Continue to drink clear liquids until bedtime.  7.  Before going to bed, mix second dose of MoviPrep solution:    Empty 1 Pouch A and 1 Pouch B into the disposable container    Add lukewarm drinking water to the top line of the container. Mix to dissolve    Refrigerate  THE DAY OF YOUR PROCEDURE      DATE: 04/22/10 DAY: WEDNESDAY  Beginning at 5:30AM (5 hours before procedure):         1. Every 15 minutes, drink the solution down to the next mark (approx 8 oz) until the full liter is complete.  2. Follow completed prep with 16 oz. of clear liquid of your choice.    3. You may drink clear liquids until 8:30AM (2 HOURS BEFORE PROCEDURE).   MEDICATION INSTRUCTIONS  Unless otherwise instructed, you should take regular prescription medications with a small sip of water   as early as possible the morning of your  procedure.       OTHER INSTRUCTIONS  You will need a responsible adult at least 69 years of age to accompany you and drive you home.   This person must remain in the waiting room during your procedure.  Wear loose fitting clothing that is easily removed.  Leave jewelry and other valuables at home.  However, you may wish to bring a book to read or  an iPod/MP3 player to listen to music as you wait for your procedure to start.  Remove all body piercing jewelry and leave at home.  Total time from sign-in until discharge is approximately 2-3 hours.  You should go home directly after your procedure and rest.  You can resume normal activities the  day after your procedure.  The day of your procedure you should not:   Drive   Make legal decisions   Operate machinery   Drink alcohol   Return to work  You will receive specific instructions about eating, activities and medications before you leave.    The above instructions have been reviewed and explained to me by   Wyona Almas RN  April 10, 2010 9:26 AM     I fully understand  and can verbalize these instructions _____________________________ Date _________

## 2010-08-04 NOTE — Assessment & Plan Note (Signed)
Summary: FOR BP CHECK//PH   Vital Signs:  Patient profile:   70 year old female Weight:      206 pounds Temp:     98.2 degrees F oral Pulse rate:   82 / minute BP sitting:   140 / 90  (left arm)  Vitals Entered By: Jeremy Johann CMA (July 25, 2009 2:36 PM) CC: ELEVATED BP Comments REVIEWED MED LIST, PATIENT AGREED DOSE AND INSTRUCTION CORRECT    History of Present Illness:  Hypertension follow-up      This is a 70 year old woman who presents for Hypertension follow-up.  The patient denies lightheadedness, urinary frequency, headaches, edema, impotence, rash, and fatigue.  The patient denies the following associated symptoms: chest pain, chest pressure, exercise intolerance, dyspnea, palpitations, syncope, leg edema, and pedal edema.  Compliance with medications (by patient report) has been near 100%.  The patient reports that dietary compliance has been good.  The patient reports no exercise.  Adjunctive measures currently used by the patient include salt restriction.    Current Medications (verified): 1)  Synthroid 125 Mcg Tabs (Levothyroxine Sodium) .Marland Kitchen.. 1 By Mouth Once Daily 2)  Zocor 40 Mg  Tabs (Simvastatin) .... Take One Tablet At Bedtime 3)  Diovan 320 Mg Tabs (Valsartan) .Marland Kitchen.. 1 By Mouth Once Daily 4)  Alprazolam 0.25 Mg  Tabs (Alprazolam) .Marland Kitchen.. 1 By Mouth Once Daily Prn 5)  Vitamin D [address] Unit Caps (Ergocalciferol) .... Take One Tablet Each Week. 6)  Toprol Xl 50 Mg Xr24h-Tab (Metoprolol Succinate) .Marland Kitchen.. 1 By Mouth Once Daily  Allergies: 1)  ! Penicillin 2)  ! Hydrochlorothiazide 3)  ! Lisinopril 4)  ! Vicodin  Past History:  Past medical, surgical, family and social histories (including risk factors) reviewed for relevance to current acute and chronic problems.  Past Medical History: Reviewed history from 08/10/2007 and no changes required. Hyperlipidemia Hypertension Hypothyroidism  Past Surgical History: Reviewed history from 08/10/2007 and no changes  required. Partial Hysterectomy-1968 Bilateral Breast reduction-1993  Family History: Reviewed history and no changes required.  Social History: Reviewed history from 12/10/2008 and no changes required. Retired Married Never Smoked Alcohol use-yes Drug use-no Regular exercise-yes  Review of Systems      See HPI  Physical Exam  General:  Well-developed,well-nourished,in no acute distress; alert,appropriate and cooperative throughout examination Lungs:  Normal respiratory effort, chest expands symmetrically. Lungs are clear to auscultation, no crackles or wheezes. Heart:  Normal rate and regular rhythm. S1 and S2 normal without gallop, murmur, click, rub or other extra sounds. Extremities:  No clubbing, cyanosis, edema, or deformity noted with normal full range of motion of all joints.   Psych:  Oriented X3 and normally interactive.     Impression & Recommendations:  Problem # 1:  HYPERTENSION (ICD-401.9)  Her updated medication list for this problem includes:    Diovan 320 Mg Tabs (Valsartan) .Marland Kitchen... 1 by mouth once daily    Toprol Xl 50 Mg Xr24h-tab (Metoprolol succinate) .Marland Kitchen... 1 by mouth once daily  BP today: 140/90 Prior BP: 142/82 (04/04/2009)  Labs Reviewed: K+: 4.0 (04/08/2009) Creat: : 1.3 (04/08/2009)   Chol: 200 (04/08/2009)   HDL: 43.90 (04/08/2009)   LDL: DEL (08/29/2008)   TG: 252.0 (04/08/2009)  Complete Medication List: 1)  Synthroid 125 Mcg Tabs (Levothyroxine sodium) .Marland Kitchen.. 1 by mouth once daily 2)  Zocor 40 Mg Tabs (Simvastatin) .... Take one tablet at bedtime 3)  Diovan 320 Mg Tabs (Valsartan) .Marland Kitchen.. 1 by mouth once daily 4)  Alprazolam 0.25  Mg Tabs (Alprazolam) .Marland Kitchen.. 1 by mouth once daily prn 5)  Vitamin D [address] Unit Caps (Ergocalciferol) .... Take one tablet each week. 6)  Toprol Xl 50 Mg Xr24h-tab (Metoprolol succinate) .Marland Kitchen.. 1 by mouth once daily Prescriptions: ALPRAZOLAM 0.25 MG  TABS (ALPRAZOLAM) 1 by mouth once daily prn  #30 x 0   Entered and  Authorized by:   Loreen Freud DO   Signed by:   Loreen Freud DO on 07/25/2009   Method used:   Print then Give to Patient   RxID:   [id number] TOPROL XL 50 MG XR24H-TAB (METOPROLOL SUCCINATE) 1 by mouth once daily  #30 x 2   Entered and Authorized by:   Loreen Freud DO   Signed by:   Loreen Freud DO on 07/25/2009   Method used:   Electronically to        [facility]       [address]       [place], [place]  [address]       Ph: [phone] or [phone]       Fax: [phone]   RxID:   [id number]  aa

## 2010-08-04 NOTE — Assessment & Plan Note (Signed)
Summary: roa/cbs   Vital Signs:  Patient profile:   70 year old female Height:      63 inches Weight:      208.13 pounds BMI:     37.00 Pulse rate:   80 / minute Pulse rhythm:   regular BP sitting:   136 / 88  (left arm) Cuff size:   large  Vitals Entered By: Army Fossa CMA (October 29, 2009 10:25 AM) CC: Pt here for follow up on BP   History of Present Illness:  Hypertension follow-up      This is a 70 year old woman who presents for Hypertension follow-up.  Pt [place] bp [place] at beach---130/80 .  The patient denies lightheadedness, urinary frequency, headaches, edema, impotence, rash, and fatigue.  The patient denies the following associated symptoms: chest pain, chest pressure, exercise intolerance, dyspnea, palpitations, syncope, leg edema, and pedal edema.  Compliance with medications (by patient report) has been near 100%.  The patient reports that dietary compliance has been good.  The patient reports exercising daily.  Adjunctive measures currently used by the patient include salt restriction.    Current Medications (verified): 1)  Synthroid 125 Mcg Tabs (Levothyroxine Sodium) .Marland Kitchen.. 1 By Mouth Once Daily 2)  Zocor 40 Mg  Tabs (Simvastatin) .... Take One Tablet At Bedtime 3)  Diovan 320 Mg Tabs (Valsartan) .Marland Kitchen.. 1 By Mouth Once Daily 4)  Bystolic 10 Mg Tabs (Nebivolol Hcl) .Marland Kitchen.. 1 By Mouth Once Daily 5)  Omeprazole 20 Mg Cpdr (Omeprazole) .Marland Kitchen.. 1 By Mouth Qam  Allergies: 1)  ! Penicillin 2)  ! Hydrochlorothiazide 3)  ! Lisinopril 4)  ! Vicodin  Past History:  Past medical, surgical, family and social histories (including risk factors) reviewed for relevance to current acute and chronic problems.  Past Medical History: Reviewed history from 08/10/2007 and no changes required. Hyperlipidemia Hypertension Hypothyroidism  Past Surgical History: Reviewed history from 08/10/2007 and no changes required. Partial Hysterectomy-1968 Bilateral Breast  reduction-1993  Family History: Reviewed history and no changes required.  Social History: Reviewed history from 12/10/2008 and no changes required. Retired Married Never Smoked Alcohol use-yes Drug use-no Regular exercise-yes  Review of Systems      See HPI  Physical Exam  General:  Well-developed,well-nourished,in no acute distress; alert,appropriate and cooperative throughout examination Lungs:  Normal respiratory effort, chest expands symmetrically. Lungs are clear to auscultation, no crackles or wheezes. Heart:  normal rate and no murmur.   Extremities:  trace left pedal edema and trace right pedal edema.   Psych:  Cognition and judgment appear intact. Alert and cooperative with normal attention span and concentration. No apparent delusions, illusions, hallucinations   Impression & Recommendations:  Problem # 1:  HYPERTENSION (ICD-401.9)  Her updated medication list for this problem includes:    Diovan 320 Mg Tabs (Valsartan) .Marland Kitchen... 1 by mouth once daily    Bystolic 10 Mg Tabs (Nebivolol hcl) .Marland Kitchen... 1 by mouth once daily  BP today: 136/88 Prior BP: 148/90 (09/04/2009)  Labs Reviewed: K+: 4.0 (09/10/2009) Creat: : 1.1 (09/10/2009)   Chol: 174 (09/10/2009)   HDL: 55.00 (09/10/2009)   LDL: 81 (09/10/2009)   TG: 189.0 (09/10/2009)  Complete Medication List: 1)  Synthroid 125 Mcg Tabs (Levothyroxine sodium) .Marland Kitchen.. 1 by mouth once daily 2)  Zocor 40 Mg Tabs (Simvastatin) .... Take one tablet at bedtime 3)  Diovan 320 Mg Tabs (Valsartan) .Marland Kitchen.. 1 by mouth once daily 4)  Bystolic 10 Mg Tabs (Nebivolol hcl) .Marland Kitchen.. 1 by mouth  once daily 5)  Omeprazole 20 Mg Cpdr (Omeprazole) .Marland Kitchen.. 1 by mouth qam  Patient Instructions: 1)  Please schedule a follow-up appointment in 3 months .  2)  labs 2 weeks before ov--  NMR, hep, bmp 401.9 272.4

## 2010-08-04 NOTE — Letter (Signed)
Summary: Colonoscopy Letter   Gastroenterology  [address] [place], [place] [address]   Phone: [phone]  Fax: [phone]      March 25, 2010 MRN: [id number]   [facility] [address] [place], [place]  [address]   Dear Ms. Brousseau,   According to your medical record, it is time for you to schedule a Colonoscopy. The American Cancer Society recommends this procedure as a method to detect early colon cancer. Patients with a family history of colon cancer, or a personal history of colon polyps or inflammatory bowel disease are at increased risk.  This letter has been generated based on the recommendations made at the time of your procedure. If you feel that in your particular situation this may no longer apply, please contact our office.  Please call our office at [phone] to schedule this appointment or to update your records at your earliest convenience.  Thank you for cooperating with [place] to provide you with the very best care possible.   Sincerely,  Barbette Hair. Arlyce Dice, M.D.  [facility] Gastroenterology [facility] [phone]

## 2010-08-04 NOTE — Progress Notes (Signed)
Summary: lab result  Phone Note Outgoing Call   Call placed by: [facility] CMA,  January 13, 2010 8:27 AM Details for Reason: t.   bun/ cr elevated-----increase water intake to at least 40 oz a day----  recheck 2 weeks Summary of Call: tried to call pt no VM will try again later..................Marland KitchenFelecia Deloach CMA  January 13, 2010 8:27 AM  pt aware, pt currently out of town will call later when back in town to schedule appt..............Marland KitchenFelecia Deloach CMA  January 13, 2010 4:42 PM

## 2010-08-04 NOTE — Assessment & Plan Note (Signed)
Summary: NEED MED REFILL, NEED BP CHECK///SPH   Vital Signs:  Patient profile:   70 year old female Height:      63 inches Weight:      204.8 pounds BMI:     36.41 Temp:     98.8 degrees F oral Pulse rate:   68 / minute Pulse rhythm:   regular BP sitting:   124 / 76  (left arm)  Vitals Entered By: Almeta Monas CMA Duncan Dull) (March 05, 2010 11:02 AM) CC: f/u on meds, per pt not taking current doses of synthroid and she switched to simvastatin.    History of Present Illness: Pt here f/u synthroid , bp and cholesterol.  Pt never took lipitor--she stayed on zocor and has been taking 0.127 synthroid because she ran out of the lower dose.  No complaints.  Current Medications (verified): 1)  Synthroid 112 Mcg Tabs (Levothyroxine Sodium) .... Take One Tablet Daily 2)  Zocor 40 Mg Tabs (Simvastatin) .Marland Kitchen.. 1 By Mouth At Bedtime 3)  Diovan 320 Mg Tabs (Valsartan) .Marland Kitchen.. 1 By Mouth Once Daily 4)  Omeprazole 20 Mg Cpdr (Omeprazole) .Marland Kitchen.. 1 By Mouth Qam 5)  Aspir-Low 81 Mg Tbec (Aspirin) .Marland Kitchen.. 1 By Mouth Once Daily  Allergies (verified): 1)  ! Penicillin 2)  ! Hydrochlorothiazide 3)  ! Lisinopril 4)  ! Vicodin  Past History:  Past medical, surgical, family and social histories (including risk factors) reviewed for relevance to current acute and chronic problems.  Past Medical History: Reviewed history from 08/10/2007 and no changes required. Hyperlipidemia Hypertension Hypothyroidism  Past Surgical History: Reviewed history from 08/10/2007 and no changes required. Partial Hysterectomy-1968 Bilateral Breast reduction-1993  Family History: Reviewed history and no changes required.  Social History: Reviewed history from 12/10/2008 and no changes required. Retired Married Never Smoked Alcohol use-yes Drug use-no Regular exercise-yes  Review of Systems      See HPI  Physical Exam  General:  Well-developed,well-nourished,in no acute distress; alert,appropriate and  cooperative throughout examination Lungs:  Normal respiratory effort, chest expands symmetrically. Lungs are clear to auscultation, no crackles or wheezes. Heart:  normal rate and no murmur.   Psych:  Cognition and judgment appear intact. Alert and cooperative with normal attention span and concentration. No apparent delusions, illusions, hallucinations   Impression & Recommendations:  Problem # 1:  HYPOTHYROIDISM (ICD-244.9)  Her updated medication list for this problem includes:    Synthroid 112 Mcg Tabs (Levothyroxine sodium) .Marland Kitchen... Take one tablet daily  Labs Reviewed: TSH: 0.20 (01/02/2010)    HgBA1c: 5.7 (04/08/2009) Chol: 174 (09/10/2009)   HDL: 55.00 (09/10/2009)   LDL: 81 (09/10/2009)   TG: 189.0 (09/10/2009)  Problem # 2:  HYPERTENSION (ICD-401.9)  Her updated medication list for this problem includes:    Diovan 320 Mg Tabs (Valsartan) .Marland Kitchen... 1 by mouth once daily  BP today: 124/76 Prior BP: 118/82 (01/09/2010)  Labs Reviewed: K+: 4.8 (01/09/2010) Creat: : 1.25 (01/09/2010)   Chol: 174 (09/10/2009)   HDL: 55.00 (09/10/2009)   LDL: 81 (09/10/2009)   TG: 189.0 (09/10/2009)  Problem # 3:  HYPERLIPIDEMIA (ICD-272.4)  Her updated medication list for this problem includes:    Zocor 40 Mg Tabs (Simvastatin) .Marland Kitchen... 1 by mouth at bedtime  Labs Reviewed: SGOT: 37 (01/09/2010)   SGPT: 45 (01/09/2010)   HDL:55.00 (09/10/2009), 43.90 (04/08/2009)  LDL:81 (09/10/2009), DEL (62/95/2841)  Chol:174 (09/10/2009), 200 (04/08/2009)  Trig:189.0 (09/10/2009), 252.0 (04/08/2009)  Complete Medication List: 1)  Synthroid 112 Mcg Tabs (Levothyroxine sodium) .... Take one  tablet daily 2)  Zocor 40 Mg Tabs (Simvastatin) .Marland Kitchen.. 1 by mouth at bedtime 3)  Diovan 320 Mg Tabs (Valsartan) .Marland Kitchen.. 1 by mouth once daily 4)  Omeprazole 20 Mg Cpdr (Omeprazole) .Marland Kitchen.. 1 by mouth qam 5)  Aspir-low 81 Mg Tbec (Aspirin) .Marland Kitchen.. 1 by mouth once daily  Other Orders: Gastroenterology Referral (GI)  Patient  Instructions: 1)  fasting labs 2 months----244.9  272.4   lipid, hep, tsh Prescriptions: ZOCOR 40 MG TABS (SIMVASTATIN) 1 by mouth at bedtime  #90 x 3   Entered and Authorized by:   Loreen Freud DO   Signed by:   Loreen Freud DO on 03/05/2010   Method used:   Faxed to ...       [facility] Environmental education officer)       [address] [address]       [place], [place]  [address]       Ph: [phone]       Fax: [phone]   RxID:   [id number] SYNTHROID 112 MCG TABS (LEVOTHYROXINE SODIUM) take one tablet daily  #90 x 3   Entered and Authorized by:   Loreen Freud DO   Signed by:   Loreen Freud DO on 03/05/2010   Method used:   Faxed to ...       [facility] Environmental education officer)       [address] [address]       [place], [place]  [address]       Ph: [phone]       Fax: [phone]   RxID:   [id number]

## 2010-08-04 NOTE — Assessment & Plan Note (Signed)
Summary: rto 3 months/cbs   Vital Signs:  Patient profile:   70 year old female Height:      63 inches Weight:      204 pounds Temp:     98.4 degrees F oral Pulse rate:   62 / minute BP sitting:   118 / 82  (left arm)  Vitals Entered By: Jeremy Johann CMA (January 09, 2010 2:09 PM) CC: f/u med Comments REVIEWED MED LIST, PATIENT AGREED DOSE AND INSTRUCTION CORRECT    History of Present Illness: Pt here to f/ u b/p from last week.  Dizziness has almost completely resolved off bystolic. Pt does have some muscle aches occassionally.  No other complaints.  Current Medications (verified): 1)  Synthroid 112 Mcg Tabs (Levothyroxine Sodium) .... Take One Tablet Daily 2)  Zocor 40 Mg  Tabs (Simvastatin) .... Take One Tablet At Bedtime 3)  Diovan 320 Mg Tabs (Valsartan) .Marland Kitchen.. 1 By Mouth Once Daily 4)  Omeprazole 20 Mg Cpdr (Omeprazole) .Marland Kitchen.. 1 By Mouth Qam  Allergies: 1)  ! Penicillin 2)  ! Hydrochlorothiazide 3)  ! Lisinopril 4)  ! Vicodin  Past History:  Past medical, surgical, family and social histories (including risk factors) reviewed for relevance to current acute and chronic problems.  Past Medical History: Reviewed history from 08/10/2007 and no changes required. Hyperlipidemia Hypertension Hypothyroidism  Past Surgical History: Reviewed history from 08/10/2007 and no changes required. Partial Hysterectomy-1968 Bilateral Breast reduction-1993  Family History: Reviewed history and no changes required.  Social History: Reviewed history from 12/10/2008 and no changes required. Retired Married Never Smoked Alcohol use-yes Drug use-no Regular exercise-yes  Review of Systems      See HPI  Physical Exam  General:  Well-developed,well-nourished,in no acute distress; alert,appropriate and cooperative throughout examination Lungs:  Normal respiratory effort, chest expands symmetrically. Lungs are clear to auscultation, no crackles or wheezes. Heart:  normal rate  and no murmur.   Extremities:  No clubbing, cyanosis, edema, or deformity noted with normal full range of motion of all joints.   Skin:  Intact without suspicious lesions or rashes Psych:  Oriented X3 and normally interactive.     Impression & Recommendations:  Problem # 1:  MYALGIA (ICD-729.1) hold zocor---call in 2-3 weeks to let us know if symptoms resolve Orders: Venipuncture ([address]) TLB-BMP (Basic Metabolic Panel-BMET) (80048-METABOL) TLB-Hepatic/Liver Function Pnl (80076-HEPATIC) TLB-CK Total Only(Creatine Kinase/CPK) (82550-CK) T-Vitamin D (25-Hydroxy) ([address]-91478) T-Antinuclear Antib (ANA) (29562-13086) TLB-Rheumatoid Factor (RA) (57846-NG) TLB-Sedimentation Rate (ESR) (85652-ESR)  Problem # 2:  DIZZINESS (ICD-780.4) Assessment: Improved  resolved  off bystolic  Problem # 3:  HYPERTENSION (ICD-401.9)  The following medications were removed from the medication list:    Bystolic 10 Mg Tabs (Nebivolol hcl) .Marland Kitchen... 1 by mouth once daily Her updated medication list for this problem includes:    Diovan 320 Mg Tabs (Valsartan) .Marland Kitchen... 1 by mouth once daily  BP today: 118/82 Prior BP: 134/80 (01/02/2010)  Labs Reviewed: K+: 4.8 (12/29/2009) Creat: : 1.4 (12/29/2009)   Chol: 174 (09/10/2009)   HDL: 55.00 (09/10/2009)   LDL: 81 (09/10/2009)   TG: 189.0 (09/10/2009)  Complete Medication List: 1)  Synthroid 112 Mcg Tabs (Levothyroxine sodium) .... Take one tablet daily 2)  Zocor 40 Mg Tabs (Simvastatin) .... Take one tablet at bedtime 3)  Diovan 320 Mg Tabs (Valsartan) .Marland Kitchen.. 1 by mouth once daily 4)  Omeprazole 20 Mg Cpdr (Omeprazole) .Marland Kitchen.. 1 by mouth qam  Patient Instructions: 1)  If pains go away off zocor call us  so we can change the medication Prescriptions: DIOVAN 320 MG TABS (VALSARTAN) 1 by mouth once daily  #90 x 3   Entered and Authorized by:   Loreen Freud DO   Signed by:   Loreen Freud DO on 01/09/2010   Method used:   Faxed to ...       [facility]  Environmental education officer)       [address] [address]       [place], [place]  [address]       Ph: [phone]       Fax: [phone]   RxID:   [id number]

## 2010-08-04 NOTE — Progress Notes (Signed)
Summary: Labs- FYI  Phone Note Outgoing Call Call back at [facility] [phone]   Summary of Call: Pts ALT and AST are elevated. Per Dr.Tabori pt needs to hold all tylenol and alcohol this week and repeat labs in 1 week. Army Fossa CMA  December 29, 2009 4:24 PM   Follow-up for Phone Call        I spoke with pt and she is aware. Army Fossa CMA  December 29, 2009 4:26 PM

## 2010-08-04 NOTE — Progress Notes (Signed)
Summary: REFILL  Phone Note Refill Request Message from:  Fax from Pharmacy on September 17, 2009 8:50 AM  Refills Requested: Medication #1:  SYNTHROID 125 MCG TABS 1 by mouth once daily [BMN] [facility] FAX [phone]    Method Requested: Mail to Pharmacy Next Appointment Scheduled: 10/23/2009 Initial call taken by: Barb Merino,  September 17, 2009 8:53 AM    Prescriptions: SYNTHROID 125 MCG TABS (LEVOTHYROXINE SODIUM) 1 by mouth once daily Brand medically necessary #90 x 3   Entered by:   Army Fossa CMA   Authorized by:   Loreen Freud DO   Signed by:   Army Fossa CMA on 09/17/2009   Method used:   Printed then faxed to ...       [facility] Environmental education officer)       [address] [address]       [place], [place]  [address]       Ph: [phone]       Fax: [phone]   RxID:   [id number]

## 2010-08-04 NOTE — Progress Notes (Signed)
Summary: pulse drop, bp still elevated  Phone Note Call from Patient Call back at Home Phone [phone]   Caller: Patient Summary of Call: pt states that she was told to call if pulse went under 60 .  pt states that pulse is 47  and bp 144/86. pt denies any symptoms other than  fatigue. pt states that she was told to cut metoprolol  in half if  fatigue continue......................Marland KitchenFelecia Deloach CMA  July 29, 2009 11:33 AM     Follow-up for Phone Call        stop toprol---leave samples of bystolic 5 mg --- 1 a day--- she should have f/u 2-3 weeks from last visit Follow-up by: Loreen Freud DO,  July 29, 2009 1:57 PM  Additional Follow-up for Phone Call Additional follow up Details #1::        left message to call office................Marland KitchenFelecia Deloach CMA  July 29, 2009 2:57 PM   pt aware will pick-up sample tomorrow.................Marland KitchenFelecia Deloach CMA  July 29, 2009 3:37 PM     New/Updated Medications: BYSTOLIC 5 MG TABS (NEBIVOLOL HCL) 1 by mouth once daily

## 2010-08-04 NOTE — Assessment & Plan Note (Signed)
Summary: DIZZINESS/KN   Vital Signs:  Patient profile:   70 year old female Weight:      200 pounds Pulse rate:   60 / minute Pulse (ortho):   60 / minute BP sitting:   134 / 80  (left arm) BP standing:   134 / 80  Vitals Entered By: Doristine Devoid (January 02, 2010 10:57 AM)  Serial Vital Signs/Assessments:  Time      Position  BP       Pulse  Resp  Temp     By 11:03 AM  Lying RA  120/80   87                    Chemira Jones 11:03 AM  Sitting   134/80   58                    Chemira Jones 11:03 AM  Standing  134/80   60                    Chemira Jones  CC: BP yest 145/115 dizziness    History of Present Illness: 70 yo woman here today for dizziness.  'i feel terrible'.  'i feel jittery, tired'.  some chest tightness.  sxs started weeks ago.  'i just have no energy'.  + hoarseness per pt report but husband doesn't feel it has changed.  BP elevated yesterday.  some dizziness w/ position change.  sxs since starting the Bystolic.  Allergies: 1)  ! Penicillin 2)  ! Hydrochlorothiazide 3)  ! Lisinopril 4)  ! Vicodin  Past History:  Past Medical History: Last updated: 08/10/2007 Hyperlipidemia Hypertension Hypothyroidism  Review of Systems      See HPI  Physical Exam  General:  Well-developed,well-nourished,in no acute distress; alert,appropriate and cooperative throughout examination Head:  Normocephalic and atraumatic without obvious abnormalities. No apparent alopecia or balding. Ears:  External ear exam shows no significant lesions or deformities.  Otoscopic examination reveals clear canals, tympanic membranes are intact bilaterally without bulging, retraction, inflammation or discharge. Hearing is grossly normal bilaterally. Nose:  External nasal examination shows no deformity or inflammation. Nasal mucosa are pink and moist without lesions or exudates. Lungs:  Normal respiratory effort, chest expands symmetrically. Lungs are clear to auscultation, no crackles or  wheezes. Heart:  normal rate and no murmur.   Pulses:  +2 carotid, radial, DP Extremities:  trace left pedal edema and trace right pedal edema.     Impression & Recommendations:  Problem # 1:  FATIGUE (ICD-780.79) Assessment Unchanged pt's sxs started when placed on beta blocker.  given results of orthostatics she is likely not able to increase her heart rate appropriately w/ position change.  stop beta blocker, check labs to r/o thyroid abnormality or problem w/ synthroid dose and r/o anemia.  reviewed supportive care and red flags that should prompt return.  Pt expresses understanding and is in agreement w/ this plan. Orders: Venipuncture (47829) TLB-CBC Platelet - w/Differential (85025-CBCD) TLB-TSH (Thyroid Stimulating Hormone) (84443-TSH)  Problem # 2:  CHEST PAIN UNSPECIFIED (ICD-786.50) Assessment: Unchanged pt described chest tightness.  EKG reviewed by cards- bradycardic w/ extra beats.  nothing acute.  Problem # 3:  DIZZINESS (ICD-780.4) Assessment: New likely due to position change in the setting of beta blockade (see #1) Orders: EKG w/ Interpretation (93000)  Complete Medication List: 1)  Synthroid 112 Mcg Tabs (Levothyroxine sodium) .... Take one tablet daily 2)  Zocor 40 Mg  Tabs (Simvastatin) .... Take one tablet at bedtime 3)  Diovan 320 Mg Tabs (Valsartan) .Marland Kitchen.. 1 by mouth once daily 4)  Bystolic 10 Mg Tabs (Nebivolol hcl) .Marland Kitchen.. 1 by mouth once daily 5)  Omeprazole 20 Mg Cpdr (Omeprazole) .Marland Kitchen.. 1 by mouth qam  Patient Instructions: 1)  Follow up as scheduled with Dr Laury Axon next week 2)  STOP the Bystolic 3)  We'll notify you of your lab results 4)  Change positions slowly- allow your body time to adjust 5)  Drink plenty of fluids 6)  If your hoarseness persists, please discuss this w/ Dr Laury Axon 7)  Call with any questions or concerns 8)  Hang in there!

## 2010-08-04 NOTE — Progress Notes (Signed)
Summary: Bystolic Refill  Phone Note Call from Patient Call back at Home Phone [phone]   Caller: Patient Reason for Call: Refill Medication Summary of Call: Patient called and said she was given samples of Bystolic a few weeks ago and she is down to 4 pills now. She wanted to know if she was to make an appt to come see you or if you would send a presciption for [facility] to [facility] [address] It is  Bistolic 5mg . Thanks Initial call taken by: Harold Barban,  August 08, 2009 8:40 AM  Follow-up for Phone Call        she was supposed to come in for bp check but we can call in 1 month worth until she comes in Follow-up by: Loreen Freud DO,  August 08, 2009 8:51 AM  Additional Follow-up for Phone Call Additional follow up Details #1::        Pts husband is aware. Army Fossa CMA  August 08, 2009 9:28 AM     Prescriptions: BYSTOLIC 5 MG TABS (NEBIVOLOL HCL) 1 by mouth once daily  #30 x 0   Entered by:   Army Fossa CMA   Authorized by:   Loreen Freud DO   Signed by:   Army Fossa CMA on 08/08/2009   Method used:   Electronically to        [facility] [address] ([facility]       [address]       [place], [place]  [address]       Ph: [phone] or [phone]       Fax: [phone]   RxID:   [id number]

## 2010-08-04 NOTE — Progress Notes (Signed)
Summary: Generic  Phone Note Call from Patient   Summary of Call: Pt called and she would like to change to the generic synthroid for expenses. She would like it sent to [facility]. Please advise if this is okay. Army Fossa CMA  September 22, 2009 8:05 AM   Follow-up for Phone Call        just make sure she knows that the generic is not as good as the brand and can mean more lab checks----  switch to generic---labs in 2 months or sooner if she develops symptoms low thyroid.   244.9  TSH, free T3,  free T4 Follow-up by: Loreen Freud DO,  September 22, 2009 9:37 AM  Additional Follow-up for Phone Call Additional follow up Details #1::        Left message for pt. [facility] did not recieve rx on 09/17/09 resent in.Army Fossa CMA  September 22, 2009 9:56 AM     Additional Follow-up for Phone Call Additional follow up Details #2::    Pt does not want to switch to Generic. She will stay with Brand name. Army Fossa CMA  September 24, 2009 1:13 PM   Prescriptions: SYNTHROID 125 MCG TABS (LEVOTHYROXINE SODIUM) 1 by mouth once daily Brand medically necessary #90 x 3   Entered by:   Army Fossa CMA   Authorized by:   Loreen Freud DO   Signed by:   Army Fossa CMA on 09/24/2009   Method used:   Faxed to ...       [facility] Environmental education officer)       [address] [address]       [place], [place]  [address]       Ph: [phone]       Fax: [phone]   RxID:   [id number]

## 2010-08-04 NOTE — Procedures (Signed)
Summary: Colonoscopy  Patient: Hannah Morrow Note: All result statuses are Final unless otherwise noted.  Tests: (1) Colonoscopy (COL)   COL Colonoscopy           DONE     [facility]     [address]     [place], [place]  [address]           COLONOSCOPY PROCEDURE REPORT           PATIENT:  Hannah Morrow, Hannah Morrow  MR#:  [id number]     BIRTHDATE:  [birth year], 69 yrs. old  GENDER:  female           ENDOSCOPIST:  Barbette Hair. Arlyce Dice, MD     Referred by:           PROCEDURE DATE:  04/22/2010     PROCEDURE:  Diagnostic Colonoscopy     ASA CLASS:  Class II     INDICATIONS:  1) Elevated Risk Screening  2) family history of     colon cancer Father           MEDICATIONS:   Fentanyl 75 mcg IV, Versed 10 mg IV           DESCRIPTION OF PROCEDURE:   After the risks benefits and     alternatives of the procedure were thoroughly explained, informed     consent was obtained.  Digital rectal exam was performed and     revealed no abnormalities.   The LB [id number] endoscope     was introduced through the anus and advanced to the cecum, which     was identified by both the appendix and ileocecal valve, without     limitations.  The quality of the prep was excellent, using     MiraLax.  The instrument was then slowly withdrawn as the colon     was fully examined.     <<PROCEDUREIMAGES>>           FINDINGS:  A normal appearing cecum, ileocecal valve, and     appendiceal orifice were identified. The ascending, hepatic     flexure, transverse, splenic flexure, descending, sigmoid colon,     and rectum appeared unremarkable (see image2, image3, image5,     image8, image9, image10, image14, and image17).   Retroflexed     views in the rectum revealed no abnormalities.    The time to     cecum =  3.75  minutes. The scope was then withdrawn (time =  8.50     min) from the patient and the procedure completed.           COMPLICATIONS:  None           ENDOSCOPIC IMPRESSION:     1)  Normal colon     RECOMMENDATIONS:     1) Given your significant family history of colon cancer, you     should have a repeat colonoscopy in 5 years           REPEAT EXAM:  In 5 year(s) for Colonoscopy.           ______________________________     Barbette Hair. Arlyce Dice, MD           CC: Lelon Perla, DO           n.     eSIGNED:   Barbette Hair. Kaplan at 04/22/2010 11:07 AM           Fabiola Backer, [phone]  Note: An  exclamation mark (!) indicates a result that was not dispersed into the flowsheet. Document Creation Date: 04/22/2010 11:07 AM _______________________________________________________________________  (1) Order result status: Final Collection or observation date-time: 04/22/2010 11:03 Requested date-time:  Receipt date-time:  Reported date-time:  Referring Physician:   Ordering Physician: Melvia Heaps [phone]) Specimen Source:  Source: Launa Grill Order Number: [id number] Lab site:   Appended Document: Colonoscopy    Clinical Lists Changes  Observations: Added new observation of COLONNXTDUE: 04/2015 (04/22/2010 12:49)

## 2010-08-06 NOTE — Progress Notes (Signed)
Summary: Omeprazole refill  Phone Note Refill Request Message from:  Fax from Pharmacy on July 22, 2010 9:49 AM  Refills Requested: Medication #1:  OMEPRAZOLE 20 MG CPDR 1 by mouth QAM [facility]     phone   [phone]    fax - [phone]  Next Appointment Scheduled: none Initial call taken by: Jerolyn Shin,  July 22, 2010 9:51 AM    Prescriptions: OMEPRAZOLE 20 MG CPDR (OMEPRAZOLE) 1 by mouth QAM  #90 x 3   Entered by:   Almeta Monas CMA (AAMA)   Authorized by:   Loreen Freud DO   Signed by:   Almeta Monas CMA (AAMA) on 07/22/2010   Method used:   Faxed to ...       [facility] Environmental education officer)       [address] [address]       [place], [place]  [address]       Ph: [phone]       Fax: [phone]   RxID:   [id number]

## 2010-08-26 ENCOUNTER — Ambulatory Visit (INDEPENDENT_AMBULATORY_CARE_PROVIDER_SITE_OTHER): Payer: Medicare Other | Admitting: Family Medicine

## 2010-08-26 ENCOUNTER — Encounter: Payer: Self-pay | Admitting: Family Medicine

## 2010-08-26 DIAGNOSIS — M549 Dorsalgia, unspecified: Secondary | ICD-10-CM | POA: Insufficient documentation

## 2010-09-01 NOTE — Assessment & Plan Note (Signed)
Summary: back pain/kn   Vital Signs:  Patient profile:   70 year old female Weight:      205 pounds BMI:     36.45 Pulse rate:   76 / minute BP sitting:   110 / 80  (left arm)  Vitals Entered By: Doristine Devoid CMA (August 26, 2010 2:34 PM) CC: Back Pain x2 wks improve some then started getting worse recently started exercising   History of Present Illness: 70 yo woman here today for back pain.  started exercising 3 weeks ago at [employer].  went to gym on Friday and when she got home 'everything seized up'.  only comfortable standing or lying flat.  used heating pad all weekend, took advil.  went to [employer] Monday and was unable to work out.  pain is bandlike across lumbar spine.  no pain at rest, pain w/ changing position.  pain w/ walking.  no weakness or numbness of legs.  has had sciatica- this is different.  Current Medications (verified): 1)  Synthroid 112 Mcg Tabs (Levothyroxine Sodium) .... Take One Tablet Daily 2)  Zocor 40 Mg Tabs (Simvastatin) .Marland Kitchen.. 1 By Mouth At Bedtime 3)  Diovan 320 Mg Tabs (Valsartan) .Marland Kitchen.. 1 By Mouth Once Daily 4)  Omeprazole 20 Mg Cpdr (Omeprazole) .Marland Kitchen.. 1 By Mouth Qam 5)  Aspir-Low 81 Mg Tbec (Aspirin) .Marland Kitchen.. 1 By Mouth Once Daily  Allergies (verified): 1)  ! Penicillin 2)  ! Hydrochlorothiazide 3)  ! Lisinopril 4)  ! Vicodin  Review of Systems      See HPI  Physical Exam  General:  Well-developed,well-nourished,in no acute distress; alert,appropriate and cooperative throughout examination Msk:  + TTP over lumbar paraspinal muscles.  no pain w/ extension, + pain w/ flexion and lateral bending.  (-) SLR bilaterally Pulses:  +2 DP/PT Extremities:  no C/C/E Neurologic:  alert & oriented X3, cranial nerves II-XII intact, strength normal in all extremities, sensation intact to light touch, gait normal, and DTRs symmetrical and normal.     Impression & Recommendations:  Problem # 1:  BACK PAIN (ICD-724.5) Assessment New  pt's pain consistent w/  muscular pain.  start NSAIDs, muscle relaxers.  reviewed supportive care and red flags that should prompt return.  Pt expresses understanding and is in agreement w/ this plan. Her updated medication list for this problem includes:    Aspir-low 81 Mg Tbec (Aspirin) .Marland Kitchen... 1 by mouth once daily    Naprosyn 500 Mg Tabs (Naproxen) .Marland Kitchen... 1 two times a day x5-7 days and then as needed.  take w/ food.    Cyclobenzaprine Hcl 10 Mg Tabs (Cyclobenzaprine hcl) .Marland Kitchen... 1 by mouth 2 times daily as needed for back pain.  will cause drowsiness  Orders: Prescription Created Electronically [id number])  Complete Medication List: 1)  Synthroid 112 Mcg Tabs (Levothyroxine sodium) .... Take one tablet daily 2)  Zocor 40 Mg Tabs (Simvastatin) .Marland Kitchen.. 1 by mouth at bedtime 3)  Diovan 320 Mg Tabs (Valsartan) .Marland Kitchen.. 1 by mouth once daily 4)  Omeprazole 20 Mg Cpdr (Omeprazole) .Marland Kitchen.. 1 by mouth qam 5)  Aspir-low 81 Mg Tbec (Aspirin) .Marland Kitchen.. 1 by mouth once daily 6)  Naprosyn 500 Mg Tabs (Naproxen) .Marland Kitchen.. 1 two times a day x5-7 days and then as needed.  take w/ food. 7)  Cyclobenzaprine Hcl 10 Mg Tabs (Cyclobenzaprine hcl) .Marland Kitchen.. 1 by mouth 2 times daily as needed for back pain.  will cause drowsiness  Patient Instructions: 1)  This appears to be muscular and should improve  w/ time 2)  Take the Naprosyn as directed 3)  Use the muscle relaxer at night for pain relief 4)  Continue the heating pad for pain relief 5)  Call with any questions or concerns 6)  Hang in there!! Prescriptions: CYCLOBENZAPRINE HCL 10 MG  TABS (CYCLOBENZAPRINE HCL) 1 by mouth 2 times daily as needed for back pain.  will cause drowsiness  #30 x 0   Entered and Authorized by:   Neena Rhymes MD   Signed by:   Neena Rhymes MD on 08/26/2010   Method used:   Electronically to        [id number] (retail)       [address]       [place], [place]  [address]       Ph: [phone] or [phone]       Fax: [phone]   RxID:   [id number] NAPROSYN  500 MG TABS (NAPROXEN) 1 two times a day x5-7 days and then as needed.  take w/ food.  #60 x 0   Entered and Authorized by:   Neena Rhymes MD   Signed by:   Neena Rhymes MD on 08/26/2010   Method used:   Electronically to        [id number] (retail)       [address]       [place], [place]  [address]       Ph: [phone] or [phone]       Fax: [phone]   RxID:   [id number]    Orders Added: 1)  Est. Patient Level III [57322] 2)  Prescription Created Electronically [phone]

## 2010-10-20 ENCOUNTER — Encounter: Payer: Self-pay | Admitting: Family Medicine

## 2010-10-20 ENCOUNTER — Ambulatory Visit (INDEPENDENT_AMBULATORY_CARE_PROVIDER_SITE_OTHER): Payer: Medicare Other | Admitting: Family Medicine

## 2010-10-20 DIAGNOSIS — M722 Plantar fascial fibromatosis: Secondary | ICD-10-CM | POA: Insufficient documentation

## 2010-10-20 NOTE — Progress Notes (Signed)
  Subjective:    Patient ID: Hannah Morrow, female    DOB: 15-Oct-1940, 70 y.o.   MRN: [id number]  HPI Pt here c/o L foot pain.  She had plantar fascitis  and was taking naprosyn with relief but when she stopped pain came back and so she started med again but second time it did not work.  She went to podiatrist --Dr Stacie Acres.  He gave her an injection which did not help.  xrays were done but nothing seen.     Review of Systems As above    Objective:   Physical Exam Gen--AAOx3 , NAD   L foot--no swelling                 No dec rom                 No pain with palpation   Assessment & Plan:

## 2010-10-20 NOTE — Patient Instructions (Signed)

## 2010-10-20 NOTE — Assessment & Plan Note (Addendum)
con't gel inserts Consider stress fracture-- pt does not want a boot or ortho referral Ice, rest Ortho if no better

## 2010-11-03 ENCOUNTER — Other Ambulatory Visit: Payer: Self-pay | Admitting: Family Medicine

## 2010-11-03 DIAGNOSIS — Z1231 Encounter for screening mammogram for malignant neoplasm of breast: Secondary | ICD-10-CM

## 2010-11-28 ENCOUNTER — Other Ambulatory Visit: Payer: Self-pay | Admitting: Family Medicine

## 2010-12-01 MED ORDER — LEVOTHYROXINE SODIUM 112 MCG PO TABS: 112.0000 ug | ORAL_TABLET | Freq: Every day | ORAL | Status: DC

## 2010-12-01 MED ORDER — VALSARTAN 320 MG PO TABS: 320.0000 mg | ORAL_TABLET | Freq: Every day | ORAL | Status: DC

## 2010-12-01 NOTE — Telephone Encounter (Signed)
Addended by: Arnette Norris on: 12/01/2010 10:36 AM   Modules accepted: Orders

## 2010-12-02 ENCOUNTER — Ambulatory Visit
Admission: RE | Admit: 2010-12-02 | Discharge: 2010-12-02 | Disposition: A | Discharge status: Home / Self Care | Payer: Medicare Other | Source: Ambulatory Visit | Attending: Family Medicine | Admitting: Family Medicine

## 2010-12-02 DIAGNOSIS — Z1231 Encounter for screening mammogram for malignant neoplasm of breast: Secondary | ICD-10-CM

## 2010-12-09 ENCOUNTER — Other Ambulatory Visit: Payer: Self-pay | Admitting: Family Medicine

## 2010-12-09 DIAGNOSIS — R928 Other abnormal and inconclusive findings on diagnostic imaging of breast: Secondary | ICD-10-CM

## 2010-12-10 ENCOUNTER — Ambulatory Visit
Admission: RE | Admit: 2010-12-10 | Discharge: 2010-12-10 | Disposition: A | Discharge status: Home / Self Care | Payer: Medicare Other | Source: Ambulatory Visit | Attending: Family Medicine | Admitting: Family Medicine

## 2010-12-10 DIAGNOSIS — R928 Other abnormal and inconclusive findings on diagnostic imaging of breast: Secondary | ICD-10-CM

## 2010-12-11 ENCOUNTER — Encounter: Payer: Self-pay | Admitting: *Deleted

## 2010-12-15 ENCOUNTER — Other Ambulatory Visit: Payer: Medicare Other

## 2011-01-18 ENCOUNTER — Other Ambulatory Visit: Payer: Self-pay | Admitting: Family Medicine

## 2011-01-18 ENCOUNTER — Encounter: Payer: Self-pay | Admitting: *Deleted

## 2011-01-18 NOTE — Telephone Encounter (Signed)
272.4   [facility], Letter Mail

## 2011-03-11 ENCOUNTER — Ambulatory Visit (INDEPENDENT_AMBULATORY_CARE_PROVIDER_SITE_OTHER): Payer: Medicare Other | Admitting: Family Medicine

## 2011-03-11 ENCOUNTER — Encounter: Payer: Self-pay | Admitting: Family Medicine

## 2011-03-11 DIAGNOSIS — L821 Other seborrheic keratosis: Secondary | ICD-10-CM

## 2011-03-11 DIAGNOSIS — E039 Hypothyroidism, unspecified: Secondary | ICD-10-CM

## 2011-03-11 DIAGNOSIS — E785 Hyperlipidemia, unspecified: Secondary | ICD-10-CM

## 2011-03-11 NOTE — Progress Notes (Signed)
  Subjective:    Patient ID: Hannah Morrow, female    DOB: 04-16-1941, 70 y.o.   MRN: [id number]  HPI     Pt is here c/o lesions on her chest that have been there a long time but they are itchy.    Review of Systems As above    Objective:   Physical Exam  Constitutional: She is oriented to person, place, and time. She appears well-developed and well-nourished.  Neck: Normal range of motion. Neck supple. No thyromegaly present.  Neurological: She is alert and oriented to person, place, and time.  Skin:       + SK on chest==multiple  Psychiatric: She has a normal mood and affect. Her behavior is normal. Judgment and thought content normal.          Assessment & Plan:     1. SK-- rto for cryo     2hypothyroid---check labs

## 2011-03-11 NOTE — Patient Instructions (Signed)
Return for freezing of SK

## 2011-03-19 ENCOUNTER — Encounter: Payer: Self-pay | Admitting: Family Medicine

## 2011-03-19 ENCOUNTER — Ambulatory Visit (INDEPENDENT_AMBULATORY_CARE_PROVIDER_SITE_OTHER): Payer: Medicare Other | Admitting: Family Medicine

## 2011-03-19 DIAGNOSIS — L570 Actinic keratosis: Secondary | ICD-10-CM

## 2011-03-19 DIAGNOSIS — I10 Essential (primary) hypertension: Secondary | ICD-10-CM

## 2011-03-19 DIAGNOSIS — L851 Acquired keratosis [keratoderma] palmaris et plantaris: Secondary | ICD-10-CM

## 2011-03-19 DIAGNOSIS — E039 Hypothyroidism, unspecified: Secondary | ICD-10-CM

## 2011-03-19 DIAGNOSIS — E785 Hyperlipidemia, unspecified: Secondary | ICD-10-CM

## 2011-03-19 LAB — BASIC METABOLIC PANEL
BUN: 27 mg/dL — ABNORMAL HIGH (ref 6–23)
GFR: 48.27 mL/min — ABNORMAL LOW (ref >60.00)
Potassium: 4.1 mEq/L (ref 3.5–5.1)
Sodium: 140 mEq/L (ref 135–145)

## 2011-03-19 LAB — HEPATIC FUNCTION PANEL
AST: 20 U/L (ref 0–37)
Alkaline Phosphatase: 67 U/L (ref 39–117)
Total Bilirubin: 0.9 mg/dL (ref 0.3–1.2)

## 2011-03-19 LAB — LIPID PANEL
Cholesterol: 184 mg/dL (ref 0–200)
HDL: 60.5 mg/dL (ref >39.00)
LDL Cholesterol: 96 mg/dL (ref 0–99)
VLDL: 27.4 mg/dL (ref 0.0–40.0)

## 2011-03-19 NOTE — Patient Instructions (Signed)
Actinic Keratosis Actinic keratosis is a growth on the skin that is considered to be precancerous. That means it could develop into skin cancer if it is not treated. About 1% of such growths will turn into skin cancer, therefore, it is important that the skin growth is removed. An actinic keratosis might be as small as a pinhead or as big as a quarter. They appear most often on areas of skin that get a lot of sun exposure throughout your life. These include the bald scalp, face, ears, lips, upper back and the backs of hands and forearms. An actinic keratosis usually looks like a scaly, rough spot of skin. Sometimes there might be a little tag of pink or gray skin growing off them.  CAUSES Sun damage causes abnormal growth of skin cells. Actinic keratoses (more than one growth) are the result of sun damage. You are more likely to develop them if you:  Have light-colored skin.   Are older (actinic keratoses increases with age).   Sunburn easily.   Have spent a lot of time in the sun.   Have had a job that involves a lot of outdoor work, such as a Hydrographic surveyor.  SYMPTOMS  Blotchy, red and white patches of skin.   A skin patch that feels rough (like sandpaper), scaly or crusty.   Patches of dry, white skin on the lips.   A patch of skin that is thinner than normal.   Skin that is tender to the touch.   Although a rare symptom; an area of skin that bleeds.  DIAGNOSIS To decide if you have actinic keratosis, your caregiver will probably:  Ask about symptoms you have noticed.   Ask about your history of exposure to the sun.   Ask about your overall health history.   Examine the skin that concerns you. The caregiver may want to look at skin on other parts of your body that have had a lot of sun exposure.   Order a biopsy. A biopsy is the removal of a small sample of tissue from the patch of skin. It is then examined for signs of cancer.  TREATMENT Actinic keratosis can be  treated several ways. Most of the time, treatments can be done in a clinic or in your caregiver's office. Be sure to discuss the different options with your caregiver. They include:  Surgical removal (curettage). A special surgical instrument (a curette) is used to scrape the growth.   Cryosurgery. Liquid nitrogen is used to freeze the patch of skin. Often it is sprayed on the area. The growth will eventually fall off.   5-FU (5-fluorouracil) cream. The cream is applied several times a day for up to 4 weeks. The skin often becomes red and irritated, but the growths will go away. This method is often used if actinic keratoses or the skin is badly damaged.   Chemical peel. Chemicals are applied to the skin on a small spot. The outer layers of skin at that spot are then peeled off.   Photodynamic therapy. A drug (photosensitizing agent) is put on the skin before exposing the skin to a strong light. Together, the drug and strong light destroys the actinic keratoses.   Imiquimod cream. A medicine usually applied to growths on the face or scalp.  PROGNOSIS Early treatment of actinic keratoses usually gets rid of the growths without further worries. POSSIBLE COMPLICATIONS  Skin irritation or redness from the treatment.   Scarring where the patch of skin was  removed.   Development of skin cancer. This rarely happens if the growths are removed early on.  HOME CARE INSTRUCTIONS  An adhesive bandage, and possibly gauze, will cover the treated area.   Change and remove the bandage as directed by your caregiver.   Keep the treated area dry as directed by your caregiver.   Apply any creams that your caregiver prescribed. Follow the directions carefully.   To prevent future skin damage:   Wear sunscreen year-round, not just in the summer. The winter sun can damage skin, too.   Wear long-sleeved clothing and wide-brimmed hats.   When possible, avoid midday exposure to the sun.   If you want  to look tan, try sunless tanning products (lotions and sprays). Avoid tanning beds.   Commit to regularly checking your skin for new changes.   Visit a skin doctor (dermatologist ) every year for a skin exam.  SEEK MEDICAL CARE IF:  The treated area of skin does not heal and becomes more irritated, red or bloody.   You notice other patches of skin that are similar to the actinic keratoses that were treated.  Document Released: 09/17/2008 Document Re-Released: 12/09/2009 [facility] Patient Information 2011 [place], [place].

## 2011-03-19 NOTE — Progress Notes (Signed)
  Subjective:     Hannah Morrow is a 70 y.o. female who is here  for evaluation and treatment of seborrheic keratosis.  The lesion has not changed in color recently, and has not changed in size. The lesion is irritating. Patient denies bleeding or ulceration.  Patient denies other skin lesions. Patient wishes the lesion treated via destruction therapy.  The following portions of the patient's history were reviewed and updated as appropriate: allergies, current medications, past family history, past medical history, past social history, past surgical history and problem list.  Review of Systems Pertinent items are noted in HPI.    Objective:    Skin: several small--0.5-1 cm brown stuck-on waxy hyperkeratotic papule/plaque without secondary changes. Examination of face/arms/back/chest/abd. Shows no suspicious lesions.   Lesions are on her chest.   Assessment:    Seborrheic Keratosis    Plan:    1. Treatment options also discussed including: cryotherapy, curettage, and no treatment. Area is irritated, therefore would likely benefit from liquid nitrogen treatment. Discussed risks of hyper/hypo pigmentation and infection, and patient verbally consents to treatment.  2. Procedure Note: The areas are treated with liquid nitrogen therapy, frozen until ice ball extended 1-2 mm beyond lesion, allowed to thaw, and treated again. Patient tolerated procedure well.  Patient was instructed on post-op care, warned that there may be blister formation, redness and pain. Recommend OTC analgesia as needed for pain.  3. Verbal patient instruction given.  4. Follow up as needed for acute illness.

## 2011-03-21 ENCOUNTER — Other Ambulatory Visit: Payer: Self-pay | Admitting: Family Medicine

## 2011-06-09 ENCOUNTER — Other Ambulatory Visit: Payer: Self-pay | Admitting: Family Medicine

## 2011-07-14 DIAGNOSIS — Z23 Encounter for immunization: Secondary | ICD-10-CM | POA: Diagnosis not present

## 2011-08-13 ENCOUNTER — Ambulatory Visit (INDEPENDENT_AMBULATORY_CARE_PROVIDER_SITE_OTHER): Payer: Medicare Other | Admitting: Family Medicine

## 2011-08-13 ENCOUNTER — Encounter: Payer: Self-pay | Admitting: Family Medicine

## 2011-08-13 VITALS — BP 137/85 | HR 88 | Temp 98.3°F | Ht 63.0 in | Wt 200.8 lb

## 2011-08-13 DIAGNOSIS — R197 Diarrhea, unspecified: Secondary | ICD-10-CM | POA: Diagnosis not present

## 2011-08-13 DIAGNOSIS — R079 Chest pain, unspecified: Secondary | ICD-10-CM | POA: Diagnosis not present

## 2011-08-13 MED ORDER — PROMETHAZINE HCL 25 MG PO TABS: 25.0000 mg | ORAL_TABLET | Freq: Four times a day (QID) | ORAL | Status: AC | PRN

## 2011-08-13 NOTE — Assessment & Plan Note (Signed)
Most likely related to gas or indigestion as this resolved w/ diarrhea and burping.  EKG today is unchanged compared to previous.  Currently asymptomatic.  Reviewed red flags and sxs that should prompt immediate medical attention.  Pt expressed understanding and is in agreement w/ plan.

## 2011-08-13 NOTE — Patient Instructions (Signed)
This is most likely a viral illness and will improve w/ time Take immodium as needed for diarrhea Use Phenergan as needed for nausea (will make you drowsy) Drink plenty of fluids REST! Hang in there!

## 2011-08-13 NOTE — Progress Notes (Signed)
  Subjective:    Patient ID: Hannah Morrow, female    DOB: 10/31/1940, 71 y.o.   MRN: [id number]  HPI Indigestion- worked out, walked dog, went shopping, took shower.  During shower had 'funny pain' in center of chest.  Pain got severe, 'felt exactly like gas'.  Burped and pain lessened.  Then had abdominal cramping, diarrhea and sxs resolved.  Currently feels 'weird'- 'like i would have the chills if i thought about it'.  Still having mild nausea.  No known sick contacts.  No upper respiratory symptoms.  No SOB, diaphoresis.  Cardiac risk factors include HTN, hyperlipidemia.   Review of Systems For ROS see HPI     Objective:   Physical Exam  Vitals reviewed. Constitutional: She is oriented to person, place, and time. She appears well-developed and well-nourished. No distress.  HENT:  Head: Normocephalic and atraumatic.  Eyes: Conjunctivae and EOM are normal. Pupils are equal, round, and reactive to light.  Neck: Normal range of motion. Neck supple. No thyromegaly present.  Cardiovascular: Normal rate, regular rhythm, normal heart sounds and intact distal pulses.   No murmur heard. Pulmonary/Chest: Effort normal and breath sounds normal. No respiratory distress.  Abdominal: Soft. She exhibits no distension. There is no tenderness. There is no rebound and no guarding.  Musculoskeletal: She exhibits no edema.  Lymphadenopathy:    She has no cervical adenopathy.  Neurological: She is alert and oriented to person, place, and time.  Skin: Skin is warm and dry.  Psychiatric: She has a normal mood and affect. Her behavior is normal.          Assessment & Plan:

## 2011-08-13 NOTE — Assessment & Plan Note (Signed)
Most likely due to something she ate or beginning of viral illness.  No red flags on abdominal exam.  Reviewed supportive care and red flags that should prompt return.  Pt expressed understanding and is in agreement w/ plan.

## 2011-10-28 DIAGNOSIS — H2510 Age-related nuclear cataract, unspecified eye: Secondary | ICD-10-CM | POA: Diagnosis not present

## 2011-10-28 DIAGNOSIS — H35379 Puckering of macula, unspecified eye: Secondary | ICD-10-CM | POA: Diagnosis not present

## 2011-11-22 ENCOUNTER — Encounter: Payer: Self-pay | Admitting: Family Medicine

## 2011-11-22 ENCOUNTER — Ambulatory Visit (INDEPENDENT_AMBULATORY_CARE_PROVIDER_SITE_OTHER): Payer: Medicare Other | Admitting: Family Medicine

## 2011-11-22 VITALS — BP 114/80 | HR 103 | Temp 101.0°F | Wt 200.0 lb

## 2011-11-22 DIAGNOSIS — R509 Fever, unspecified: Secondary | ICD-10-CM | POA: Diagnosis not present

## 2011-11-22 DIAGNOSIS — B9789 Other viral agents as the cause of diseases classified elsewhere: Secondary | ICD-10-CM

## 2011-11-22 DIAGNOSIS — N390 Urinary tract infection, site not specified: Secondary | ICD-10-CM

## 2011-11-22 DIAGNOSIS — B349 Viral infection, unspecified: Secondary | ICD-10-CM

## 2011-11-22 LAB — HEPATIC FUNCTION PANEL
AST: 35 U/L (ref 0–37)
Alkaline Phosphatase: 53 U/L (ref 39–117)
Bilirubin, Direct: 0.1 mg/dL (ref 0.0–0.3)
Total Bilirubin: 0.8 mg/dL (ref 0.3–1.2)

## 2011-11-22 LAB — POCT URINALYSIS DIPSTICK
Bilirubin, UA: NEGATIVE
Blood, UA: NEGATIVE
Glucose, UA: NEGATIVE
Ketones, UA: NEGATIVE
Spec Grav, UA: 1.01
pH, UA: 6

## 2011-11-22 LAB — CBC WITH DIFFERENTIAL/PLATELET
Basophils Absolute: 0 10*3/uL (ref 0.0–0.1)
Eosinophils Absolute: 0 10*3/uL (ref 0.0–0.7)
Hemoglobin: 13.9 g/dL (ref 12.0–15.0)
Lymphocytes Relative: 22 % (ref 12.0–46.0)
Monocytes Relative: 13.2 % — ABNORMAL HIGH (ref 3.0–12.0)
Neutro Abs: 2.9 10*3/uL (ref 1.4–7.7)
Neutrophils Relative %: 63.8 % (ref 43.0–77.0)
Platelets: 161 10*3/uL (ref 150.0–400.0)
RDW: 13.8 % (ref 11.5–14.6)

## 2011-11-22 LAB — BASIC METABOLIC PANEL
Calcium: 9.3 mg/dL (ref 8.4–10.5)
Creatinine, Ser: 1.3 mg/dL — ABNORMAL HIGH (ref 0.4–1.2)
GFR: 41.96 mL/min — ABNORMAL LOW (ref >60.00)
Glucose, Bld: 113 mg/dL — ABNORMAL HIGH (ref 70–99)
Sodium: 135 mEq/L (ref 135–145)

## 2011-11-22 MED ORDER — OSELTAMIVIR PHOSPHATE 75 MG PO CAPS: 75.0000 mg | ORAL_CAPSULE | Freq: Two times a day (BID) | ORAL | Status: AC

## 2011-11-22 NOTE — Progress Notes (Signed)
Addended by: Silvio Pate D on: 11/22/2011 01:36 PM   Modules accepted: Orders

## 2011-11-22 NOTE — Patient Instructions (Signed)
Fever  Fever is a higher-than-normal body temperature. A normal temperature varies with:  Age.   How it is measured (mouth, underarm, rectal, or ear).   Time of day.  In an adult, an oral temperature around 98.6 Fahrenheit (F) or 37 Celsius (C) is considered normal. A rise in temperature of about 1.8 F or 1 C is generally considered a fever (100.4 F or 38 C). In an infant age 70 days or less, a rectal temperature of 100.4 F (38 C) generally is regarded as fever. Fever is not a disease but can be a symptom of illness. CAUSES   Fever is most commonly caused by infection.   Some non-infectious problems can cause fever. For example:   Some arthritis problems.   Problems with the thyroid or adrenal glands.   Immune system problems.   Some kinds of cancer.   A reaction to certain medicines.   Occasionally, the source of a fever cannot be determined. This is sometimes called a "Fever of Unknown Origin" (FUO).   Some situations may lead to a temporary rise in body temperature that may go away on its own. Examples are:   Childbirth.   Surgery.   Some situations may cause a rise in body temperature but these are not considered "true fever". Examples are:   Intense exercise.   Dehydration.   Exposure to high outside or room temperatures.  SYMPTOMS   Feeling warm or hot.   Fatigue or feeling exhausted.   Aching all over.   Chills.   Shivering.   Sweats.  DIAGNOSIS  A fever can be suspected by your caregiver feeling that your skin is unusually warm. The fever is confirmed by taking a temperature with a thermometer. Temperatures can be taken different ways. Some methods are accurate and some are not: With adults, adolescents, and children:   An oral temperature is used most commonly.   An ear thermometer will only be accurate if it is positioned as recommended by the manufacturer.   Under the arm temperatures are not accurate and not recommended.   Most  electronic thermometers are fast and accurate.  Infants and Toddlers:  Rectal temperatures are recommended and most accurate.   Ear temperatures are not accurate in this age group and are not recommended.   Skin thermometers are not accurate.  RISKS AND COMPLICATIONS   During a fever, the body uses more oxygen, so a person with a fever may develop rapid breathing or shortness of breath. This can be dangerous especially in people with heart or lung disease.   The sweats that occur following a fever can cause dehydration.   High fever can cause seizures in infants and children.   Older persons can develop confusion during a fever.  TREATMENT   Medications may be used to control temperature.   Do not give aspirin to children with fevers. There is an association with Reye's syndrome. Reye's syndrome is a rare but potentially deadly disease.   If an infection is present and medications have been prescribed, take them as directed. Finish the full course of medications until they are gone.   Sponging or bathing with room-temperature water may help reduce body temperature. Do not use ice water or alcohol sponge baths.   Do not over-bundle children in blankets or heavy clothes.   Drinking adequate fluids during an illness with fever is important to prevent dehydration.  HOME CARE INSTRUCTIONS   For adults, rest and adequate fluid intake are important. Dress according   to how you feel, but do not over-bundle.   Drink enough water and/or fluids to keep your urine clear or pale yellow.   For infants over 3 months and children, giving medication as directed by your caregiver to control fever can help with comfort. The amount to be given is based on the child's weight. Do NOT give more than is recommended.  SEEK MEDICAL CARE IF:   You or your child are unable to keep fluids down.   Vomiting or diarrhea develops.   You develop a skin rash.   An oral temperature above 102 F (38.9 C)  develops, or a fever which persists for over 3 days.   You develop excessive weakness, dizziness, fainting or extreme thirst.   Fevers keep coming back after 3 days.  SEEK IMMEDIATE MEDICAL CARE IF:   Shortness of breath or trouble breathing develops   You pass out.   You feel you are making little or no urine.   New pain develops that was not there before (such as in the head, neck, chest, back, or abdomen).   You cannot hold down fluids.   Vomiting and diarrhea persist for more than a day or two.   You develop a stiff neck and/or your eyes become sensitive to light.   An unexplained temperature above 102 F (38.9 C) develops.  Document Released: 06/21/2005 Document Revised: 06/10/2011 Document Reviewed: 06/06/2008 [employer] Patient Information 2012 [employer]. 

## 2011-11-22 NOTE — Progress Notes (Signed)
  Subjective:     Hannah Morrow is a 71 y.o. female who presents for evaluation of fever. She has had the fever for 4 days. Symptoms have been gradually worsening. Symptoms are described as fevers up to 102 degrees, and are worse in the morning. Associated symptoms are body aches and chills. Patient denies abdominal pain, diarrhea, headache, nausea, otitis symptoms, poor appetite, URI symptoms, urinary tract symptoms and vomiting.  She has tried to alleviate the symptoms with aspirin and rest with minimal relief. The patient has no known comorbidities (structural heart/valvular disease, prosthetic joints, immunocompromised state, recent dental work, known abscesses).  The following portions of the patient's history were reviewed and updated as appropriate: allergies, current medications, past family history, past medical history, past social history, past surgical history and problem list.  Review of Systems Pertinent items are noted in HPI.   Objective:    BP 114/80  Pulse 103  Temp(Src) 101 F (38.3 C) (Oral)  Wt 200 lb (90.719 kg)  SpO2 97% General appearance: alert, cooperative, appears stated age and no distress Ears: normal TM's and external ear canals both ears Nose: Nares normal. Septum midline. Mucosa normal. No drainage or sinus tenderness. Throat: lips, mucosa, and tongue normal; teeth and gums normal Neck: no adenopathy, no carotid bruit, no JVD, supple, symmetrical, trachea midline and thyroid not enlarged, symmetric, no tenderness/mass/nodules Back: symmetric, no curvature. ROM normal. No CVA tenderness. Lungs: clear to auscultation bilaterally Heart: regular rate and rhythm, S1, S2 normal, no murmur, click, rub or gallop Abdomen: soft, non-tender; bowel sounds normal; no masses,  no organomegaly Skin: Skin color, texture, turgor normal. No rashes or lesions   Assessment:    Fever is likely secondary to uncertain etiology.   Plan:    Supportive care with  appropriate antipyretics and fluids. Obtain labs per orders. Follow up in a few days or as needed.

## 2011-11-24 ENCOUNTER — Telehealth: Payer: Self-pay | Admitting: Family Medicine

## 2011-11-24 ENCOUNTER — Other Ambulatory Visit: Payer: Self-pay | Admitting: Family Medicine

## 2011-11-24 DIAGNOSIS — Z1231 Encounter for screening mammogram for malignant neoplasm of breast: Secondary | ICD-10-CM

## 2011-11-24 LAB — URINE CULTURE

## 2011-11-24 NOTE — Telephone Encounter (Signed)
Patient aware     KP 

## 2011-11-24 NOTE — Telephone Encounter (Signed)
just5 days for treatment

## 2011-11-24 NOTE — Telephone Encounter (Signed)
Caller: Hannah Morrow/Patient; PCP: Lelon Perla.; CB#: [phone]; Caller has question related to how long she should take  Tamiflu Rx. by Dr. Laury Axon on 11/22/11. Per EMR advised she should take bed BID x 5 days. Medication Questions - Adult protocol usd.   Additional information for PCP:  Dermatologist is Dr. Bufford Buttner.

## 2011-12-14 ENCOUNTER — Ambulatory Visit
Admission: RE | Admit: 2011-12-14 | Discharge: 2011-12-14 | Disposition: A | Discharge status: Home / Self Care | Payer: Medicare Other | Source: Ambulatory Visit | Attending: Family Medicine | Admitting: Family Medicine

## 2011-12-14 DIAGNOSIS — Z1231 Encounter for screening mammogram for malignant neoplasm of breast: Secondary | ICD-10-CM | POA: Diagnosis not present

## 2011-12-27 ENCOUNTER — Other Ambulatory Visit: Payer: Self-pay | Admitting: Family Medicine

## 2011-12-28 ENCOUNTER — Other Ambulatory Visit: Payer: Self-pay | Admitting: Family Medicine

## 2012-02-22 ENCOUNTER — Other Ambulatory Visit: Payer: Self-pay

## 2012-02-22 DIAGNOSIS — L259 Unspecified contact dermatitis, unspecified cause: Secondary | ICD-10-CM | POA: Diagnosis not present

## 2012-02-22 DIAGNOSIS — C44711 Basal cell carcinoma of skin of unspecified lower limb, including hip: Secondary | ICD-10-CM | POA: Diagnosis not present

## 2012-02-22 DIAGNOSIS — L820 Inflamed seborrheic keratosis: Secondary | ICD-10-CM | POA: Diagnosis not present

## 2012-02-22 DIAGNOSIS — D485 Neoplasm of uncertain behavior of skin: Secondary | ICD-10-CM | POA: Diagnosis not present

## 2012-03-08 DIAGNOSIS — C44711 Basal cell carcinoma of skin of unspecified lower limb, including hip: Secondary | ICD-10-CM | POA: Diagnosis not present

## 2012-04-16 ENCOUNTER — Other Ambulatory Visit: Payer: Self-pay | Admitting: Family Medicine

## 2012-04-17 NOTE — Telephone Encounter (Signed)
Refill done.  

## 2012-05-21 ENCOUNTER — Other Ambulatory Visit: Payer: Self-pay | Admitting: Family Medicine

## 2012-06-17 ENCOUNTER — Other Ambulatory Visit: Payer: Self-pay | Admitting: Family Medicine

## 2012-07-21 ENCOUNTER — Other Ambulatory Visit: Payer: Self-pay | Admitting: Family Medicine

## 2012-08-19 ENCOUNTER — Other Ambulatory Visit: Payer: Self-pay

## 2012-09-12 ENCOUNTER — Ambulatory Visit (INDEPENDENT_AMBULATORY_CARE_PROVIDER_SITE_OTHER): Payer: Medicare Other | Admitting: Family Medicine

## 2012-09-12 ENCOUNTER — Encounter: Payer: Self-pay | Admitting: Family Medicine

## 2012-09-12 VITALS — BP 118/70 | HR 78 | Temp 98.0°F | Ht 62.5 in | Wt 206.6 lb

## 2012-09-12 DIAGNOSIS — E2839 Other primary ovarian failure: Secondary | ICD-10-CM

## 2012-09-12 DIAGNOSIS — N390 Urinary tract infection, site not specified: Secondary | ICD-10-CM

## 2012-09-12 DIAGNOSIS — E039 Hypothyroidism, unspecified: Secondary | ICD-10-CM | POA: Diagnosis not present

## 2012-09-12 DIAGNOSIS — Z1239 Encounter for other screening for malignant neoplasm of breast: Secondary | ICD-10-CM | POA: Diagnosis not present

## 2012-09-12 DIAGNOSIS — E785 Hyperlipidemia, unspecified: Secondary | ICD-10-CM | POA: Diagnosis not present

## 2012-09-12 DIAGNOSIS — R079 Chest pain, unspecified: Secondary | ICD-10-CM

## 2012-09-12 DIAGNOSIS — Z0000 Encounter for general adult medical examination without abnormal findings: Secondary | ICD-10-CM

## 2012-09-12 DIAGNOSIS — I10 Essential (primary) hypertension: Secondary | ICD-10-CM

## 2012-09-12 LAB — CBC WITH DIFFERENTIAL/PLATELET
Basophils Absolute: 0.1 10*3/uL (ref 0.0–0.1)
Eosinophils Absolute: 0.2 10*3/uL (ref 0.0–0.7)
Hemoglobin: 13.5 g/dL (ref 12.0–15.0)
Lymphocytes Relative: 34.9 % (ref 12.0–46.0)
MCHC: 32.5 g/dL (ref 30.0–36.0)
Monocytes Relative: 11.4 % (ref 3.0–12.0)
Neutro Abs: 2.9 10*3/uL (ref 1.4–7.7)
Neutrophils Relative %: 49.2 % (ref 43.0–77.0)
RBC: 5.42 Mil/uL — ABNORMAL HIGH (ref 3.87–5.11)
RDW: 13.9 % (ref 11.5–14.6)

## 2012-09-12 LAB — HEPATIC FUNCTION PANEL
AST: 37 U/L (ref 0–37)
Albumin: 4.1 g/dL (ref 3.5–5.2)
Alkaline Phosphatase: 59 U/L (ref 39–117)
Bilirubin, Direct: 0.1 mg/dL (ref 0.0–0.3)

## 2012-09-12 LAB — LIPID PANEL
HDL: 45.2 mg/dL (ref >39.00)
Total CHOL/HDL Ratio: 4
VLDL: 43.8 mg/dL — ABNORMAL HIGH (ref 0.0–40.0)

## 2012-09-12 LAB — POCT URINALYSIS DIPSTICK
Glucose, UA: NEGATIVE
Nitrite, UA: NEGATIVE
Protein, UA: NEGATIVE
Urobilinogen, UA: 0.2

## 2012-09-12 LAB — BASIC METABOLIC PANEL
Calcium: 9.5 mg/dL (ref 8.4–10.5)
Creatinine, Ser: 1.2 mg/dL (ref 0.4–1.2)
GFR: 49.51 mL/min — ABNORMAL LOW (ref >60.00)
Glucose, Bld: 102 mg/dL — ABNORMAL HIGH (ref 70–99)
Sodium: 140 mEq/L (ref 135–145)

## 2012-09-12 LAB — LDL CHOLESTEROL, DIRECT: Direct LDL: 119.3 mg/dL

## 2012-09-12 LAB — TSH: TSH: 0.61 u[IU]/mL (ref 0.35–5.50)

## 2012-09-12 MED ORDER — ZOSTER VACCINE LIVE 19400 UNT/0.65ML ~~LOC~~ SOLR: 0.6500 mL | Freq: Once | SUBCUTANEOUS | Status: DC

## 2012-09-12 NOTE — Assessment & Plan Note (Signed)
Stable Cont meds 

## 2012-09-12 NOTE — Progress Notes (Signed)
Subjective:    Hannah Morrow is a 72 y.o. female who presents for Medicare Annual/Subsequent preventive examination.  Pt c/o chest pains x 2 occassions. Pt states it scared her so badly she had a panic attack.  Pt states the second occasion was about 1 week ago and she took [place] and it went away.    Preventive Screening-Counseling & Management  Tobacco History  Smoking status  . Never Smoker   Smokeless tobacco  . Never Used     Problems Prior to Visit 1. Chest pains  Current Problems (verified) Patient Active Problem List  Diagnosis  . HYPOTHYROIDISM  . UNSPECIFIED VITAMIN D DEFICIENCY  . HYPERLIPIDEMIA  . ANXIETY  . RESTLESS LEGS SYNDROME  . HYPERTENSION  . ASTHMA  . GERD  . MYALGIA  . DIZZINESS  . INSOMNIA  . FATIGUE  . PALPITATIONS  . CHEST PAIN UNSPECIFIED  . BACK PAIN  . Plantar fasciitis  . Diarrhea    Medications Prior to Visit Current Outpatient Prescriptions on File Prior to Visit  Medication Sig Dispense Refill  . DIOVAN 320 MG tablet TAKE 1 TABLET BY MOUTH DAILY  90 tablet  1  . ibuprofen (ADVIL,MOTRIN) 600 MG tablet Take 600 mg by mouth at bedtime as needed.        Marland Kitchen levothyroxine (SYNTHROID, LEVOTHROID) 112 MCG tablet TAKE 1 TABLET BY MOUTH DAILY  90 tablet  0  . omeprazole (PRILOSEC) 20 MG capsule TAKE 1 CAPSULE EVERY MORNING  90 capsule  1  . simvastatin (ZOCOR) 40 MG tablet TAKE 1 TABLET BY MOUTH AT BEDTIME  90 tablet  0   No current facility-administered medications on file prior to visit.    Current Medications (verified) Current Outpatient Prescriptions  Medication Sig Dispense Refill  . Ascorbic Acid (VITAMIN C) 1000 MG tablet Take 1,000 mg by mouth daily.      . Cholecalciferol (VITAMIN D) 2000 UNITS CAPS Take by mouth.      . DIOVAN 320 MG tablet TAKE 1 TABLET BY MOUTH DAILY  90 tablet  1  . ibuprofen (ADVIL,MOTRIN) 600 MG tablet Take 600 mg by mouth at bedtime as needed.        Marland Kitchen levothyroxine (SYNTHROID, LEVOTHROID) 112 MCG  tablet TAKE 1 TABLET BY MOUTH DAILY  90 tablet  0  . omeprazole (PRILOSEC) 20 MG capsule TAKE 1 CAPSULE EVERY MORNING  90 capsule  1  . simvastatin (ZOCOR) 40 MG tablet TAKE 1 TABLET BY MOUTH AT BEDTIME  90 tablet  0   No current facility-administered medications for this visit.     Allergies (verified) Hydrochlorothiazide; Hydrocodone-acetaminophen; Lisinopril; and Penicillins   PAST HISTORY  Family History Family History  Problem Relation Age of Onset  . Cancer Sister     melanoma- with mets    Social History History  Substance Use Topics  . Smoking status: Never Smoker   . Smokeless tobacco: Never Used  . Alcohol Use: Yes     Are there smokers in your home (other than you)? No  Risk Factors Current exercise habits: Gym/ health club routine includes low impact aerobics.  Dietary issues discussed: na   Cardiac risk factors: advanced age (older than 40 for men, 69 for women), dyslipidemia, hypertension and obesity (BMI >= 30 kg/m2).  Depression Screen (Note: if answer to either of the following is "Yes", a more complete depression screening is indicated)   Over the past two weeks, have you felt down, depressed or hopeless? No  Over the  past two weeks, have you felt little interest or pleasure in doing things? No  Have you lost interest or pleasure in daily life? No  Do you often feel hopeless? No  Do you cry easily over simple problems? No  Activities of Daily Living In your present state of health, do you have any difficulty performing the following activities?:  Driving? No Managing money?  No Feeding yourself? No Getting from bed to chair? No Climbing a flight of stairs? No Preparing food and eating?: No Bathing or showering? No Getting dressed: No Getting to the toilet? No Using the toilet:No Moving around from place to place: No In the past year have you fallen or had a near fall?:No   Are you sexually active?  Yes  Do you have more than one partner?   No  Hearing Difficulties: No Do you often ask people to speak up or repeat themselves? No Do you experience ringing or noises in your ears? No Do you have difficulty understanding soft or whispered voices? No   Do you feel that you have a problem with memory? No  Do you often misplace items? No  Do you feel safe at home?  Yes  Cognitive Testing  Alert? Yes  Normal Appearance?Yes  Oriented to person? Yes  Place? Yes   Time? Yes  Recall of three objects?  Yes  Can perform simple calculations? Yes  Displays appropriate judgment?Yes  Can read the correct time from a watch face?Yes   Advanced Directives have been discussed with the patient? Yes  List the Names of Other Physician/Practitioners you currently use: 1.  Derm---  Dorcas Mcmurray 2 oph-- [facility] dentist-- Dr Regino Schultze  Indicate any recent Medical Services you may have received from other than [facility] providers in the past year (date may be approximate).  Immunization History  Administered Date(s) Administered  . Td 12/10/2008    Screening Tests Health Maintenance  Topic Date Due  . Influenza Vaccine  [birth year]  . Zostavax  03/23/2002  . Pneumococcal Polysaccharide Vaccine Age 61 And Over  03/24/2007  . Mammogram  12/13/2012  . Colonoscopy  04/23/2015  . Tetanus/tdap  12/11/2018    All answers were reviewed with the patient and necessary referrals were made:  Loreen Freud, DO   09/12/2012   History reviewed:  She  has a past medical history of Hyperlipidemia; Hypertension; and Thyroid disease. She  does not have any pertinent problems on file. She  has past surgical history that includes Abdominal hysterectomy and Breast surgery. Her family history includes Cancer in her sister. She  reports that she has never smoked. She has never used smokeless tobacco. She reports that  drinks alcohol. She reports that she does not use illicit drugs. She has a current medication list which includes the following prescription(s):  vitamin c, vitamin d, diovan, ibuprofen, levothyroxine, omeprazole, and simvastatin. Current Outpatient Prescriptions on File Prior to Visit  Medication Sig Dispense Refill  . DIOVAN 320 MG tablet TAKE 1 TABLET BY MOUTH DAILY  90 tablet  1  . ibuprofen (ADVIL,MOTRIN) 600 MG tablet Take 600 mg by mouth at bedtime as needed.        Marland Kitchen levothyroxine (SYNTHROID, LEVOTHROID) 112 MCG tablet TAKE 1 TABLET BY MOUTH DAILY  90 tablet  0  . omeprazole (PRILOSEC) 20 MG capsule TAKE 1 CAPSULE EVERY MORNING  90 capsule  1  . simvastatin (ZOCOR) 40 MG tablet TAKE 1 TABLET BY MOUTH AT BEDTIME  90 tablet  0  No current facility-administered medications on file prior to visit.   She is allergic to hydrochlorothiazide; hydrocodone-acetaminophen; lisinopril; and penicillins.  Review of Systems  Review of Systems  Constitutional: Negative for activity change, appetite change and fatigue.  HENT: Negative for hearing loss, congestion, tinnitus and ear discharge.   Eyes: Negative for visual disturbance (see optho q1y -- vision corrected to 20/20 with glasses).  Respiratory: Negative for cough, chest tightness and shortness of breath.   Cardiovascular: Negative for chest pain, palpitations and leg swelling.  Gastrointestinal: Negative for abdominal pain, diarrhea, constipation and abdominal distention.  Genitourinary: Negative for urgency, frequency, decreased urine volume and difficulty urinating.  Musculoskeletal: Negative for back pain, arthralgias and gait problem.  Skin: Negative for color change, pallor and rash.  Neurological: Negative for dizziness, light-headedness, numbness and headaches.  Hematological: Negative for adenopathy. Does not bruise/bleed easily.  Psychiatric/Behavioral: Negative for suicidal ideas, confusion, sleep disturbance, self-injury, dysphoric mood, decreased concentration and agitation.  Pt is able to read and write and can do all ADLs No risk for falling No abuse/ violence in  home      Objective:     Vision by Snellen chart: opth  Body mass index is 37.16 kg/(m^2). BP 118/70  Pulse 78  Temp(Src) 98 F (36.7 C) (Oral)  Ht 5' 2.5" (1.588 m)  Wt 206 lb 9.6 oz (93.713 kg)  BMI 37.16 kg/m2  SpO2 97%  BP 118/70  Pulse 78  Temp(Src) 98 F (36.7 C) (Oral)  Ht 5' 2.5" (1.588 m)  Wt 206 lb 9.6 oz (93.713 kg)  BMI 37.16 kg/m2  SpO2 97% General appearance: alert, cooperative, appears stated age and no distress Head: Normocephalic, without obvious abnormality, atraumatic Eyes: conjunctivae/corneas clear. PERRL, EOM's intact. Fundi benign. Ears: normal TM's and external ear canals both ears Nose: Nares normal. Septum midline. Mucosa normal. No drainage or sinus tenderness. Throat: lips, mucosa, and tongue normal; teeth and gums normal Neck: no adenopathy, no carotid bruit, no JVD, supple, symmetrical, trachea midline and thyroid not enlarged, symmetric, no tenderness/mass/nodules Back: symmetric, no curvature. ROM normal. No CVA tenderness. Lungs: clear to auscultation bilaterally Breasts: normal appearance, no masses or tenderness Heart: regular rate and rhythm, S1, S2 normal, no murmur, click, rub or gallop Abdomen: soft, non-tender; bowel sounds normal; no masses,  no organomegaly Pelvic: not indicated; post-menopausal, no abnormal Pap smears in past Extremities: extremities normal, atraumatic, no cyanosis or edema Pulses: 2+ and symmetric Skin: Skin color, texture, turgor normal. No rashes or lesions Lymph nodes: Cervical, supraclavicular, and axillary nodes normal. Neurologic: Alert and oriented X 3, normal strength and tone. Normal symmetric reflexes. Normal coordination and gait Psych--  No anxiety, no depression      Assessment:     cpe      Plan:     During the course of the visit the patient was educated and counseled about appropriate screening and preventive services including:    Pneumococcal vaccine   Influenza  vaccine  Screening electrocardiogram  Screening mammography  Bone densitometry screening  Colorectal cancer screening  Advanced directives: has an advanced directive - a copy HAS NOT been provided.  Diet review for nutrition referral? Yes ____  Not Indicated __x__   Patient Instructions (the written plan) was given to the patient.  Medicare Attestation I have personally reviewed: The patient's medical and social history Their use of alcohol, tobacco or illicit drugs Their current medications and supplements The patient's functional ability including ADLs,fall risks, home safety risks, cognitive, and hearing  and visual impairment Diet and physical activities Evidence for depression or mood disorders  The patient's weight, height, BMI, and visual acuity have been recorded in the chart.  I have made referrals, counseling, and provided education to the patient based on review of the above and I have provided the patient with a written personalized care plan for preventive services.     Loreen Freud, DO   09/12/2012

## 2012-09-12 NOTE — Assessment & Plan Note (Signed)
Check labs con't meds 

## 2012-09-12 NOTE — Patient Instructions (Addendum)
Preventive Care for Adults, Female A healthy lifestyle and preventive care can promote health and wellness. Preventive health guidelines for women include the following key practices.  A routine yearly physical is a good way to check with your caregiver about your health and preventive screening. It is a chance to share any concerns and updates on your health, and to receive a thorough exam.  Visit your dentist for a routine exam and preventive care every 6 months. Brush your teeth twice a day and floss once a day. Good oral hygiene prevents tooth decay and gum disease.  The frequency of eye exams is based on your age, health, family medical history, use of contact lenses, and other factors. Follow your caregiver's recommendations for frequency of eye exams.  Eat a healthy diet. Foods like vegetables, fruits, whole grains, low-fat dairy products, and lean protein foods contain the nutrients you need without too many calories. Decrease your intake of foods high in solid fats, added sugars, and salt. Eat the right amount of calories for you.Get information about a proper diet from your caregiver, if necessary.  Regular physical exercise is one of the most important things you can do for your health. Most adults should get at least 150 minutes of moderate-intensity exercise (any activity that increases your heart rate and causes you to sweat) each week. In addition, most adults need muscle-strengthening exercises on 2 or more days a week.  Maintain a healthy weight. The body mass index (BMI) is a screening tool to identify possible weight problems. It provides an estimate of body fat based on height and weight. Your caregiver can help determine your BMI, and can help you achieve or maintain a healthy weight.For adults 20 years and older:  A BMI below 18.5 is considered underweight.  A BMI of 18.5 to 24.9 is normal.  A BMI of 25 to 29.9 is considered overweight.  A BMI of 30 and above is  considered obese.  Maintain normal blood lipids and cholesterol levels by exercising and minimizing your intake of saturated fat. Eat a balanced diet with plenty of fruit and vegetables. Blood tests for lipids and cholesterol should begin at age 20 and be repeated every 5 years. If your lipid or cholesterol levels are high, you are over 50, or you are at high risk for heart disease, you may need your cholesterol levels checked more frequently.Ongoing high lipid and cholesterol levels should be treated with medicines if diet and exercise are not effective.  If you smoke, find out from your caregiver how to quit. If you do not use tobacco, do not start.  If you are pregnant, do not drink alcohol. If you are breastfeeding, be very cautious about drinking alcohol. If you are not pregnant and choose to drink alcohol, do not exceed 1 drink per day. One drink is considered to be 12 ounces (355 mL) of beer, 5 ounces (148 mL) of wine, or 1.5 ounces (44 mL) of liquor.  Avoid use of street drugs. Do not share needles with anyone. Ask for help if you need support or instructions about stopping the use of drugs.  High blood pressure causes heart disease and increases the risk of stroke. Your blood pressure should be checked at least every 1 to 2 years. Ongoing high blood pressure should be treated with medicines if weight loss and exercise are not effective.  If you are 55 to 71 years old, ask your caregiver if you should take aspirin to prevent strokes.  Diabetes   screening involves taking a blood sample to check your fasting blood sugar level. This should be done once every 3 years, after age 45, if you are within normal weight and without risk factors for diabetes. Testing should be considered at a younger age or be carried out more frequently if you are overweight and have at least 1 risk factor for diabetes.  Breast cancer screening is essential preventive care for women. You should practice "breast  self-awareness." This means understanding the normal appearance and feel of your breasts and may include breast self-examination. Any changes detected, no matter how small, should be reported to a caregiver. Women in their 20s and 30s should have a clinical breast exam (CBE) by a caregiver as part of a regular health exam every 1 to 3 years. After age 40, women should have a CBE every year. Starting at age 40, women should consider having a mammography (breast X-ray test) every year. Women who have a family history of breast cancer should talk to their caregiver about genetic screening. Women at a high risk of breast cancer should talk to their caregivers about having magnetic resonance imaging (MRI) and a mammography every year.  The Pap test is a screening test for cervical cancer. A Pap test can show cell changes on the cervix that might become cervical cancer if left untreated. A Pap test is a procedure in which cells are obtained and examined from the lower end of the uterus (cervix).  Women should have a Pap test starting at age 21.  Between ages 21 and 29, Pap tests should be repeated every 2 years.  Beginning at age 30, you should have a Pap test every 3 years as long as the past 3 Pap tests have been normal.  Some women have medical problems that increase the chance of getting cervical cancer. Talk to your caregiver about these problems. It is especially important to talk to your caregiver if a new problem develops soon after your last Pap test. In these cases, your caregiver may recommend more frequent screening and Pap tests.  The above recommendations are the same for women who have or have not gotten the vaccine for human papillomavirus (HPV).  If you had a hysterectomy for a problem that was not cancer or a condition that could lead to cancer, then you no longer need Pap tests. Even if you no longer need a Pap test, a regular exam is a good idea to make sure no other problems are  starting.  If you are between ages 65 and 70, and you have had normal Pap tests going back 10 years, you no longer need Pap tests. Even if you no longer need a Pap test, a regular exam is a good idea to make sure no other problems are starting.  If you have had past treatment for cervical cancer or a condition that could lead to cancer, you need Pap tests and screening for cancer for at least 20 years after your treatment.  If Pap tests have been discontinued, risk factors (such as a new sexual partner) need to be reassessed to determine if screening should be resumed.  The HPV test is an additional test that may be used for cervical cancer screening. The HPV test looks for the virus that can cause the cell changes on the cervix. The cells collected during the Pap test can be tested for HPV. The HPV test could be used to screen women aged 30 years and older, and should   be used in women of any age who have unclear Pap test results. After the age of 30, women should have HPV testing at the same frequency as a Pap test.  Colorectal cancer can be detected and often prevented. Most routine colorectal cancer screening begins at the age of 50 and continues through age 75. However, your caregiver may recommend screening at an earlier age if you have risk factors for colon cancer. On a yearly basis, your caregiver may provide home test kits to check for hidden blood in the stool. Use of a small camera at the end of a tube, to directly examine the colon (sigmoidoscopy or colonoscopy), can detect the earliest forms of colorectal cancer. Talk to your caregiver about this at age 50, when routine screening begins. Direct examination of the colon should be repeated every 5 to 10 years through age 75, unless early forms of pre-cancerous polyps or small growths are found.  Hepatitis C blood testing is recommended for all people born from 1945 through 1965 and any individual with known risks for hepatitis C.  Practice  safe sex. Use condoms and avoid high-risk sexual practices to reduce the spread of sexually transmitted infections (STIs). STIs include gonorrhea, chlamydia, syphilis, trichomonas, herpes, HPV, and human immunodeficiency virus (HIV). Herpes, HIV, and HPV are viral illnesses that have no cure. They can result in disability, cancer, and death. Sexually active women aged 25 and younger should be checked for chlamydia. Older women with new or multiple partners should also be tested for chlamydia. Testing for other STIs is recommended if you are sexually active and at increased risk.  Osteoporosis is a disease in which the bones lose minerals and strength with aging. This can result in serious bone fractures. The risk of osteoporosis can be identified using a bone density scan. Women ages 65 and over and women at risk for fractures or osteoporosis should discuss screening with their caregivers. Ask your caregiver whether you should take a calcium supplement or vitamin D to reduce the rate of osteoporosis.  Menopause can be associated with physical symptoms and risks. Hormone replacement therapy is available to decrease symptoms and risks. You should talk to your caregiver about whether hormone replacement therapy is right for you.  Use sunscreen with sun protection factor (SPF) of 30 or more. Apply sunscreen liberally and repeatedly throughout the day. You should seek shade when your shadow is shorter than you. Protect yourself by wearing long sleeves, pants, a wide-brimmed hat, and sunglasses year round, whenever you are outdoors.  Once a month, do a whole body skin exam, using a mirror to look at the skin on your back. Notify your caregiver of new moles, moles that have irregular borders, moles that are larger than a pencil eraser, or moles that have changed in shape or color.  Stay current with required immunizations.  Influenza. You need a dose every fall (or winter). The composition of the flu vaccine  changes each year, so being vaccinated once is not enough.  Pneumococcal polysaccharide. You need 1 to 2 doses if you smoke cigarettes or if you have certain chronic medical conditions. You need 1 dose at age 65 (or older) if you have never been vaccinated.  Tetanus, diphtheria, pertussis (Tdap, Td). Get 1 dose of Tdap vaccine if you are younger than age 65, are over 65 and have contact with an infant, are a healthcare worker, are pregnant, or simply want to be protected from whooping cough. After that, you need a Td   booster dose every 10 years. Consult your caregiver if you have not had at least 3 tetanus and diphtheria-containing shots sometime in your life or have a deep or dirty wound.  HPV. You need this vaccine if you are a woman age 26 or younger. The vaccine is given in 3 doses over 6 months.  Measles, mumps, rubella (MMR). You need at least 1 dose of MMR if you were born in 1957 or later. You may also need a second dose.  Meningococcal. If you are age 19 to 21 and a first-year college student living in a residence hall, or have one of several medical conditions, you need to get vaccinated against meningococcal disease. You may also need additional booster doses.  Zoster (shingles). If you are age 60 or older, you should get this vaccine.  Varicella (chickenpox). If you have never had chickenpox or you were vaccinated but received only 1 dose, talk to your caregiver to find out if you need this vaccine.  Hepatitis A. You need this vaccine if you have a specific risk factor for hepatitis A virus infection or you simply wish to be protected from this disease. The vaccine is usually given as 2 doses, 6 to 18 months apart.  Hepatitis B. You need this vaccine if you have a specific risk factor for hepatitis B virus infection or you simply wish to be protected from this disease. The vaccine is given in 3 doses, usually over 6 months. Preventive Services / Frequency Ages 19 to 39  Blood  pressure check.** / Every 1 to 2 years.  Lipid and cholesterol check.** / Every 5 years beginning at age 20.  Clinical breast exam.** / Every 3 years for women in their 20s and 30s.  Pap test.** / Every 2 years from ages 21 through 29. Every 3 years starting at age 30 through age 65 or 70 with a history of 3 consecutive normal Pap tests.  HPV screening.** / Every 3 years from ages 30 through ages 65 to 70 with a history of 3 consecutive normal Pap tests.  Hepatitis C blood test.** / For any individual with known risks for hepatitis C.  Skin self-exam. / Monthly.  Influenza immunization.** / Every year.  Pneumococcal polysaccharide immunization.** / 1 to 2 doses if you smoke cigarettes or if you have certain chronic medical conditions.  Tetanus, diphtheria, pertussis (Tdap, Td) immunization. / A one-time dose of Tdap vaccine. After that, you need a Td booster dose every 10 years.  HPV immunization. / 3 doses over 6 months, if you are 26 and younger.  Measles, mumps, rubella (MMR) immunization. / You need at least 1 dose of MMR if you were born in 1957 or later. You may also need a second dose.  Meningococcal immunization. / 1 dose if you are age 19 to 21 and a first-year college student living in a residence hall, or have one of several medical conditions, you need to get vaccinated against meningococcal disease. You may also need additional booster doses.  Varicella immunization.** / Consult your caregiver.  Hepatitis A immunization.** / Consult your caregiver. 2 doses, 6 to 18 months apart.  Hepatitis B immunization.** / Consult your caregiver. 3 doses usually over 6 months. Ages 40 to 64  Blood pressure check.** / Every 1 to 2 years.  Lipid and cholesterol check.** / Every 5 years beginning at age 20.  Clinical breast exam.** / Every year after age 40.  Mammogram.** / Every year beginning at age 40   and continuing for as long as you are in good health. Consult with your  caregiver.  Pap test.** / Every 3 years starting at age 30 through age 65 or 70 with a history of 3 consecutive normal Pap tests.  HPV screening.** / Every 3 years from ages 30 through ages 65 to 70 with a history of 3 consecutive normal Pap tests.  Fecal occult blood test (FOBT) of stool. / Every year beginning at age 50 and continuing until age 75. You may not need to do this test if you get a colonoscopy every 10 years.  Flexible sigmoidoscopy or colonoscopy.** / Every 5 years for a flexible sigmoidoscopy or every 10 years for a colonoscopy beginning at age 50 and continuing until age 75.  Hepatitis C blood test.** / For all people born from 1945 through 1965 and any individual with known risks for hepatitis C.  Skin self-exam. / Monthly.  Influenza immunization.** / Every year.  Pneumococcal polysaccharide immunization.** / 1 to 2 doses if you smoke cigarettes or if you have certain chronic medical conditions.  Tetanus, diphtheria, pertussis (Tdap, Td) immunization.** / A one-time dose of Tdap vaccine. After that, you need a Td booster dose every 10 years.  Measles, mumps, rubella (MMR) immunization. / You need at least 1 dose of MMR if you were born in 1957 or later. You may also need a second dose.  Varicella immunization.** / Consult your caregiver.  Meningococcal immunization.** / Consult your caregiver.  Hepatitis A immunization.** / Consult your caregiver. 2 doses, 6 to 18 months apart.  Hepatitis B immunization.** / Consult your caregiver. 3 doses, usually over 6 months. Ages 65 and over  Blood pressure check.** / Every 1 to 2 years.  Lipid and cholesterol check.** / Every 5 years beginning at age 20.  Clinical breast exam.** / Every year after age 40.  Mammogram.** / Every year beginning at age 40 and continuing for as long as you are in good health. Consult with your caregiver.  Pap test.** / Every 3 years starting at age 30 through age 65 or 70 with a 3  consecutive normal Pap tests. Testing can be stopped between 65 and 70 with 3 consecutive normal Pap tests and no abnormal Pap or HPV tests in the past 10 years.  HPV screening.** / Every 3 years from ages 30 through ages 65 or 70 with a history of 3 consecutive normal Pap tests. Testing can be stopped between 65 and 70 with 3 consecutive normal Pap tests and no abnormal Pap or HPV tests in the past 10 years.  Fecal occult blood test (FOBT) of stool. / Every year beginning at age 50 and continuing until age 75. You may not need to do this test if you get a colonoscopy every 10 years.  Flexible sigmoidoscopy or colonoscopy.** / Every 5 years for a flexible sigmoidoscopy or every 10 years for a colonoscopy beginning at age 50 and continuing until age 75.  Hepatitis C blood test.** / For all people born from 1945 through 1965 and any individual with known risks for hepatitis C.  Osteoporosis screening.** / A one-time screening for women ages 65 and over and women at risk for fractures or osteoporosis.  Skin self-exam. / Monthly.  Influenza immunization.** / Every year.  Pneumococcal polysaccharide immunization.** / 1 dose at age 65 (or older) if you have never been vaccinated.  Tetanus, diphtheria, pertussis (Tdap, Td) immunization. / A one-time dose of Tdap vaccine if you are over   65 and have contact with an infant, are a healthcare worker, or simply want to be protected from whooping cough. After that, you need a Td booster dose every 10 years.  Varicella immunization.** / Consult your caregiver.  Meningococcal immunization.** / Consult your caregiver.  Hepatitis A immunization.** / Consult your caregiver. 2 doses, 6 to 18 months apart.  Hepatitis B immunization.** / Check with your caregiver. 3 doses, usually over 6 months. ** Family history and personal history of risk and conditions may change your caregiver's recommendations. Document Released: 08/17/2001 Document Revised: 09/13/2011  Document Reviewed: 11/16/2010 [employer] Patient Information 2013 [employer].  

## 2012-09-12 NOTE — Assessment & Plan Note (Signed)
Cont meds Check labs 

## 2012-09-12 NOTE — Assessment & Plan Note (Signed)
?   Inc gerd symptoms but will have pt see cardiology for eval

## 2012-09-13 LAB — VARICELLA ZOSTER ANTIBODY, IGG: Varicella IgG: 1683 Index — ABNORMAL HIGH (ref <135.00)

## 2012-09-27 ENCOUNTER — Encounter: Payer: Self-pay | Admitting: *Deleted

## 2012-09-28 ENCOUNTER — Encounter: Payer: Self-pay | Admitting: Cardiology

## 2012-09-28 ENCOUNTER — Ambulatory Visit (INDEPENDENT_AMBULATORY_CARE_PROVIDER_SITE_OTHER): Payer: Medicare Other | Admitting: Cardiology

## 2012-09-28 VITALS — BP 158/96 | HR 94 | Ht 62.5 in | Wt 205.8 lb

## 2012-09-28 DIAGNOSIS — I10 Essential (primary) hypertension: Secondary | ICD-10-CM | POA: Diagnosis not present

## 2012-09-28 DIAGNOSIS — R079 Chest pain, unspecified: Secondary | ICD-10-CM

## 2012-09-28 DIAGNOSIS — E785 Hyperlipidemia, unspecified: Secondary | ICD-10-CM | POA: Diagnosis not present

## 2012-09-28 NOTE — Patient Instructions (Addendum)
Your physician recommends that you schedule a follow-up appointment in: AS NEEDED  Your physician has requested that you have an exercise tolerance test. For further information please visit [email or web address] Please also follow instruction sheet, as given.    Exercise Stress Electrocardiography An exercise stress test is a heart test (EKG) which is done while you are moving. You will walk on a treadmill. This test will tell your doctor how your heart does when it is forced to work harder and how much activity you can safely handle. BEFORE THE TEST  Wear shorts or athletic pants.  Wear comfortable tennis shoes.  Women need to wear a bra that allows patches to be put on under it. TEST  An EKG cable will be attached to your waist. This cable is hooked up to patches, which look like round stickers stuck to your chest.  You will be asked to walk on the treadmill.  You will walk until you are too tired or until you are told to stop.  Tell the doctor right away if you have:  Chest pain.  Leg cramps.  Shortness of breath.  Dizziness.  The test may last 30 minutes to 1 hour. The timing depends on your physical condition and the condition of your heart. AFTER THE TEST  You will rest for about 6 minutes. During this time, your heart rhythm and blood pressure will be checked.  The testing equipment will be removed from your body and you can get dressed.  You may go home or back to your hospital room. You may keep doing all your usual activities as told by your doctor. Finding out the results of your test Ask when your test results will be ready. Make sure you get your test results. Document Released: 12/08/2007 Document Revised: 09/13/2011 Document Reviewed: 12/08/2007 [facility] Patient Information 2013 [place], [place].

## 2012-09-28 NOTE — Assessment & Plan Note (Signed)
Symptoms atypical. Will arrange exercise treadmill for risk stratification. 

## 2012-09-28 NOTE — Assessment & Plan Note (Signed)
Blood pressure elevated. We'll follow this and medications can be adjusted by primary care as needed.

## 2012-09-28 NOTE — Assessment & Plan Note (Signed)
Continue statin. Management per primary care. 

## 2012-09-28 NOTE — Progress Notes (Signed)
HPI: 72 year old female for evaluation of chest pain. No prior cardiac history. Nuclear study in 2010 showed an ejection fraction of 80% and normal perfusion. Laboratories in March of 2014 showed a BUN and creatinine of 20 and 1.2. Hemoglobin normal. Mildly elevated SGPT of 41. Patient states that recently she has had 3 separate episodes of chest pain. Each occurred while she was leaning over working on her computer. It was described as a pressure. It began in the bilateral rib area and radiated to her substernal area. No associated symptoms. Not pleuritic, positional or related to food. Some improvement with belching. Duration approximately 5 minutes. Otherwise no dyspnea on exertion, orthopnea, PND, pedal edema, syncope or exertional chest pain.  Current Outpatient Prescriptions  Medication Sig Dispense Refill  . Ascorbic Acid (VITAMIN C) 1000 MG tablet Take 1,000 mg by mouth daily.      . Cholecalciferol (VITAMIN D) 2000 UNITS CAPS Take by mouth.      . DIOVAN 320 MG tablet TAKE 1 TABLET BY MOUTH DAILY  90 tablet  1  . ibuprofen (ADVIL,MOTRIN) 600 MG tablet Take 600 mg by mouth at bedtime as needed.        Marland Kitchen levothyroxine (SYNTHROID, LEVOTHROID) 112 MCG tablet TAKE 1 TABLET BY MOUTH DAILY  90 tablet  0  . omeprazole (PRILOSEC) 20 MG capsule TAKE 1 CAPSULE EVERY MORNING  90 capsule  1  . simvastatin (ZOCOR) 40 MG tablet TAKE 1 TABLET BY MOUTH AT BEDTIME  90 tablet  0   No current facility-administered medications for this visit.    Allergies  Allergen Reactions  . Hydrochlorothiazide   . Hydrocodone-Acetaminophen   . Lisinopril   . Penicillins     Past Medical History  Diagnosis Date  . Hyperlipidemia   . Hypertension   . ANXIETY   . RESTLESS LEGS SYNDROME   . GERD   . Plantar fasciitis   . Hypothyroid     Past Surgical History  Procedure Laterality Date  . Abdominal hysterectomy    . Breast surgery      Bilateral breast reduction    History   Social History  . Marital  Status: Married    Spouse Name: N/A    Number of Children: 2  . Years of Education: N/A   Occupational History  . Not on file.   Social History Main Topics  . Smoking status: Never Smoker   . Smokeless tobacco: Never Used  . Alcohol Use: Yes     Comment: Occasional  . Drug Use: No  . Sexually Active: Yes -- Female partner(s)   Other Topics Concern  . Not on file   Social History Narrative   Exercise --- walking , gym 3 classes a week    Family History  Problem Relation Age of Onset  . Cancer Sister     melanoma- with mets    ROS: no fevers or chills, productive cough, hemoptysis, dysphasia, odynophagia, melena, hematochezia, dysuria, hematuria, rash, seizure activity, orthopnea, PND, pedal edema, claudication. Remaining systems are negative.  Physical Exam:   Blood pressure 158/96, pulse 94, height 5' 2.5" (1.588 m), weight 205 lb 12.8 oz (93.35 kg), SpO2 97.00%.  General:  Well developed/well nourished in NAD Skin warm/dry Patient not depressed No peripheral clubbing Back-normal HEENT-normal/normal eyelids Neck supple/normal carotid upstroke bilaterally; no bruits; no JVD; no thyromegaly chest - CTA/ normal expansion CV - RRR/normal S1 and S2; no murmurs, rubs or gallops;  PMI nondisplaced Abdomen -NT/ND, no HSM, no mass, +  bowel sounds, no bruit 2+ femoral pulses, no bruits Ext-no edema, chords, 2+ DP Neuro-grossly nonfocal  ECG 09/12/2012-sinus rhythm with no ST changes.

## 2012-10-06 ENCOUNTER — Ambulatory Visit (INDEPENDENT_AMBULATORY_CARE_PROVIDER_SITE_OTHER): Payer: Medicare Other | Admitting: *Deleted

## 2012-10-06 DIAGNOSIS — Z23 Encounter for immunization: Secondary | ICD-10-CM

## 2012-10-06 DIAGNOSIS — Z2911 Encounter for prophylactic immunotherapy for respiratory syncytial virus (RSV): Secondary | ICD-10-CM

## 2012-10-27 ENCOUNTER — Ambulatory Visit (INDEPENDENT_AMBULATORY_CARE_PROVIDER_SITE_OTHER): Payer: Medicare Other | Admitting: Nurse Practitioner

## 2012-10-27 ENCOUNTER — Encounter: Payer: Self-pay | Admitting: Nurse Practitioner

## 2012-10-27 DIAGNOSIS — R079 Chest pain, unspecified: Secondary | ICD-10-CM

## 2012-10-27 NOTE — Procedures (Signed)
Exercise Treadmill Test   Pre-Exercise Testing Evaluation Rhythm: normal sinus  Rate: 85     Test  Exercise Tolerance Test Ordering MD: Olga Millers, MD  Interpreting MD: Norma Fredrickson NP  Unique Test No: 1  Treadmill:  1  Indication for ETT: chest pain - rule out ischemia  Contraindication to ETT: No   Stress Modality: exercise - treadmill  Cardiac Imaging Performed: non   Protocol: standard Bruce - maximal  Max BP:  180/90  Max MPHR (bpm):  150 85% MPR (bpm):  128  MPHR obtained (bpm):  136 % MPHR obtained:  90%  Reached 85% MPHR (min:sec):  3:13 Total Exercise Time (min-sec):  4:15  Workload in METS:  5.8 Borg Scale: 15  Reason ETT Terminated:  patient's desire to stop    ST Segment Analysis At Rest: normal ST segments - no evidence of significant ST depression With Exercise: no evidence of significant ST depression  Other Information Arrhythmia:  No Angina during ETT:  absent (0) Quality of ETT:  diagnostic  ETT Interpretation:  normal - no evidence of ischemia by ST analysis  Comments: Patient presents today for routine GXT for aytpical chest pain. Seen for Dr. Jens Som. Doing well since her last visit with Dr. Jens Som. Has HTN and says her BP is good at home. Little anxious today. She does not engage in regular aerobic activity.   She exercised today on the standard Bruce protocol for a total of 4:15. Reduced and poor exercise tolerance. Target heart rate of 128 was achieved at 3:13. Test was stopped due to dyspnea and fatigue. Adequate blood pressure response. No chest pain. EKG negative for ischemia.   Recommendations: Regular aerobic exercise/weight loss Monitor BP at home. See back prn.   Patient is agreeable to this plan and will call if any problems develop in the interim.   Rosalio Macadamia, RN, ANP-C [facility] [address] [place], [place]  [address]

## 2012-11-07 ENCOUNTER — Other Ambulatory Visit: Payer: Self-pay | Admitting: Family Medicine

## 2012-11-21 DIAGNOSIS — H35379 Puckering of macula, unspecified eye: Secondary | ICD-10-CM | POA: Diagnosis not present

## 2012-12-04 ENCOUNTER — Ambulatory Visit (INDEPENDENT_AMBULATORY_CARE_PROVIDER_SITE_OTHER): Payer: Medicare Other | Admitting: Family Medicine

## 2012-12-04 ENCOUNTER — Encounter: Payer: Self-pay | Admitting: Family Medicine

## 2012-12-04 VITALS — BP 118/76 | HR 105 | Temp 98.9°F | Wt 205.0 lb

## 2012-12-04 DIAGNOSIS — L259 Unspecified contact dermatitis, unspecified cause: Secondary | ICD-10-CM | POA: Diagnosis not present

## 2012-12-04 MED ORDER — METHYLPREDNISOLONE ACETATE 80 MG/ML IJ SUSP
80.0000 mg | Freq: Once | INTRAMUSCULAR | Status: AC
  Administered 2012-12-04: 80 mg via INTRAMUSCULAR

## 2012-12-04 MED ORDER — PREDNISONE 10 MG PO TABS: ORAL_TABLET | ORAL | Status: DC

## 2012-12-04 NOTE — Patient Instructions (Signed)

## 2012-12-05 DIAGNOSIS — H01119 Allergic dermatitis of unspecified eye, unspecified eyelid: Secondary | ICD-10-CM | POA: Diagnosis not present

## 2012-12-06 ENCOUNTER — Encounter: Payer: Self-pay | Admitting: Family Medicine

## 2012-12-07 NOTE — Progress Notes (Signed)
  Subjective:     Hannah Morrow is a 72 y.o. female who presents for evaluation of a rash involving the neck and face. Rash started 4 days ago. Lesions are pink, and raised in texture. Rash has changed over time. Rash is pruritic. Associated symptoms: none. Patient denies: abdominal pain, arthralgia, congestion, cough, crankiness, decrease in appetite, decrease in energy level, fever, headache, irritability, myalgia, nausea, sore throat and vomiting. Patient has not had contacts with similar rash. Patient has had new exposures (soaps, lotions, laundry detergents, foods, medications, plants, insects or animals).  The following portions of the patient's history were reviewed and updated as appropriate: allergies, current medications, past family history, past medical history, past social history, past surgical history and problem list.  Review of Systems Pertinent items are noted in HPI.    Objective:    BP 118/76  Pulse 105  Temp(Src) 98.9 F (37.2 C) (Oral)  Wt 205 lb (92.987 kg)  BMI 36.87 kg/m2  SpO2 98% General:  alert, cooperative, appears stated age and no distress  Skin:  vesicles noted on face and neck     Assessment:    contact dermatitis: unknown    Plan:    Medications: steroids: steroid taper. Written patient instruction given. Follow up in prn -.

## 2012-12-14 ENCOUNTER — Ambulatory Visit
Admission: RE | Admit: 2012-12-14 | Discharge: 2012-12-14 | Disposition: A | Discharge status: Home / Self Care | Payer: Medicare Other | Source: Ambulatory Visit | Attending: Family Medicine | Admitting: Family Medicine

## 2012-12-14 DIAGNOSIS — Z1231 Encounter for screening mammogram for malignant neoplasm of breast: Secondary | ICD-10-CM | POA: Diagnosis not present

## 2012-12-14 DIAGNOSIS — E2839 Other primary ovarian failure: Secondary | ICD-10-CM

## 2012-12-14 DIAGNOSIS — Z780 Asymptomatic menopausal state: Secondary | ICD-10-CM | POA: Diagnosis not present

## 2012-12-14 DIAGNOSIS — Z1239 Encounter for other screening for malignant neoplasm of breast: Secondary | ICD-10-CM

## 2013-01-19 ENCOUNTER — Other Ambulatory Visit: Payer: Self-pay | Admitting: Family Medicine

## 2013-02-07 ENCOUNTER — Other Ambulatory Visit: Payer: Self-pay

## 2013-02-20 DIAGNOSIS — L821 Other seborrheic keratosis: Secondary | ICD-10-CM | POA: Diagnosis not present

## 2013-02-20 DIAGNOSIS — D2330 Other benign neoplasm of skin of unspecified part of face: Secondary | ICD-10-CM | POA: Diagnosis not present

## 2013-02-20 DIAGNOSIS — Z85828 Personal history of other malignant neoplasm of skin: Secondary | ICD-10-CM | POA: Diagnosis not present

## 2013-02-20 DIAGNOSIS — L738 Other specified follicular disorders: Secondary | ICD-10-CM | POA: Diagnosis not present

## 2013-04-23 ENCOUNTER — Encounter: Payer: Self-pay | Admitting: Family Medicine

## 2013-04-23 ENCOUNTER — Ambulatory Visit (INDEPENDENT_AMBULATORY_CARE_PROVIDER_SITE_OTHER): Payer: Medicare Other | Admitting: Family Medicine

## 2013-04-23 VITALS — BP 142/84 | HR 73 | Temp 98.7°F | Wt 205.0 lb

## 2013-04-23 DIAGNOSIS — E785 Hyperlipidemia, unspecified: Secondary | ICD-10-CM

## 2013-04-23 DIAGNOSIS — I10 Essential (primary) hypertension: Secondary | ICD-10-CM | POA: Diagnosis not present

## 2013-04-23 DIAGNOSIS — M549 Dorsalgia, unspecified: Secondary | ICD-10-CM | POA: Diagnosis not present

## 2013-04-23 DIAGNOSIS — Z0000 Encounter for general adult medical examination without abnormal findings: Secondary | ICD-10-CM | POA: Diagnosis not present

## 2013-04-23 DIAGNOSIS — Z23 Encounter for immunization: Secondary | ICD-10-CM

## 2013-04-23 DIAGNOSIS — E039 Hypothyroidism, unspecified: Secondary | ICD-10-CM | POA: Diagnosis not present

## 2013-04-23 MED ORDER — LEVOTHYROXINE SODIUM 112 MCG PO TABS: ORAL_TABLET | ORAL | Status: DC

## 2013-04-23 MED ORDER — SIMVASTATIN 40 MG PO TABS: ORAL_TABLET | ORAL | Status: DC

## 2013-04-23 MED ORDER — VALSARTAN 320 MG PO TABS: ORAL_TABLET | ORAL | Status: DC

## 2013-04-23 NOTE — Assessment & Plan Note (Signed)
stable °

## 2013-04-23 NOTE — Assessment & Plan Note (Signed)
meds refills Lab Results  Component Value Date   TSH 0.61 09/12/2012

## 2013-04-23 NOTE — Assessment & Plan Note (Signed)
meds refilled Lab Results  Component Value Date   CHOL 194 09/12/2012   HDL 45.20 09/12/2012   LDLCALC 96 03/19/2011   LDLDIRECT 119.3 09/12/2012   TRIG 219.0* 09/12/2012   CHOLHDL 4 09/12/2012

## 2013-04-23 NOTE — Patient Instructions (Addendum)
Back Pain, Adult  Low back pain is very common. About 1 in 5 people have back pain. The cause of low back pain is rarely dangerous. The pain often gets better over time. About half of people with a sudden onset of back pain feel better in just 2 weeks. About 8 in 10 people feel better by 6 weeks.   CAUSES  Some common causes of back pain include:  · Strain of the muscles or ligaments supporting the spine.  · Wear and tear (degeneration) of the spinal discs.  · Arthritis.  · Direct injury to the back.  DIAGNOSIS  Most of the time, the direct cause of low back pain is not known. However, back pain can be treated effectively even when the exact cause of the pain is unknown. Answering your caregiver's questions about your overall health and symptoms is one of the most accurate ways to make sure the cause of your pain is not dangerous. If your caregiver needs more information, he or she may order lab work or imaging tests (X-rays or MRIs). However, even if imaging tests show changes in your back, this usually does not require surgery.  HOME CARE INSTRUCTIONS  For many people, back pain returns. Since low back pain is rarely dangerous, it is often a condition that people can learn to manage on their own.   · Remain active. It is stressful on the back to sit or stand in one place. Do not sit, drive, or stand in one place for more than 30 minutes at a time. Take short walks on level surfaces as soon as pain allows. Try to increase the length of time you walk each day.  · Do not stay in bed. Resting more than 1 or 2 days can delay your recovery.  · Do not avoid exercise or work. Your body is made to move. It is not dangerous to be active, even though your back may hurt. Your back will likely heal faster if you return to being active before your pain is gone.  · Pay attention to your body when you  bend and lift. Many people have less discomfort when lifting if they bend their knees, keep the load close to their bodies, and  avoid twisting. Often, the most comfortable positions are those that put less stress on your recovering back.  · Find a comfortable position to sleep. Use a firm mattress and lie on your side with your knees slightly bent. If you lie on your back, put a pillow under your knees.  · Only take over-the-counter or prescription medicines as directed by your caregiver. Over-the-counter medicines to reduce pain and inflammation are often the most helpful. Your caregiver may prescribe muscle relaxant drugs. These medicines help dull your pain so you can more quickly return to your normal activities and healthy exercise.  · Put ice on the injured area.  · Put ice in a plastic bag.  · Place a towel between your skin and the bag.  · Leave the ice on for 15-20 minutes, 3-4 times a day for the first 2 to 3 days. After that, ice and heat may be alternated to reduce pain and spasms.  · Ask your caregiver about trying back exercises and gentle massage. This may be of some benefit.  · Avoid feeling anxious or stressed. Stress increases muscle tension and can worsen back pain. It is important to recognize when you are anxious or stressed and learn ways to manage it. Exercise is a great option.  SEEK MEDICAL CARE IF:  · You have pain that is not relieved with rest or   medicine.  · You have pain that does not improve in 1 week.  · You have new symptoms.  · You are generally not feeling well.  SEEK IMMEDIATE MEDICAL CARE IF:   · You have pain that radiates from your back into your legs.  · You develop new bowel or bladder control problems.  · You have unusual weakness or numbness in your arms or legs.  · You develop nausea or vomiting.  · You develop abdominal pain.  · You feel faint.  Document Released: 06/21/2005 Document Revised: 12/21/2011 Document Reviewed: 11/09/2010  [employer]® Patient Information ©2014 [employer].

## 2013-04-23 NOTE — Progress Notes (Signed)
  Subjective:    Hannah Morrow is a 72 y.o. female who presents for evaluation of low back pain. The patient has had no prior back problems. Symptoms have been present for 5 days and are gradually worsening.  Onset was related to / precipitated by lifting a heavy object. The pain is located in the across the lower back and does not radiate. The pain is described as sharp and occurs all day. She rates her pain as severe. Symptoms are exacerbated by sitting, standing and walking. Symptoms are improved by NSAIDs. She has also tried nothing which provided no symptom relief. She has no other symptoms associated with the back pain. The patient has no "red flag" history indicative of complicated back pain.  The following portions of the patient's history were reviewed and updated as appropriate: allergies, current medications, past family history, past medical history, past social history, past surgical history and problem list.  Review of Systems Pertinent items are noted in HPI.    Objective:   Normal reflexes, gait, strength and negative straight-leg raise. Muscle tone and ROM exam: full range of motion with pain.    Assessment:    Nonspecific acute low back pain    Plan:    Short (2-4 day) period of relative rest recommended until acute symptoms improve. Ice to affected area as needed for local pain relief. Heat to affected area as needed for local pain relief. NSAIDs per medication orders. Muscle relaxants per medication orders. --- pt has naproxyn and flexeril at home

## 2013-05-10 ENCOUNTER — Other Ambulatory Visit: Payer: Self-pay

## 2013-05-17 DIAGNOSIS — L820 Inflamed seborrheic keratosis: Secondary | ICD-10-CM | POA: Diagnosis not present

## 2013-05-17 DIAGNOSIS — L738 Other specified follicular disorders: Secondary | ICD-10-CM | POA: Diagnosis not present

## 2013-05-17 DIAGNOSIS — Z85828 Personal history of other malignant neoplasm of skin: Secondary | ICD-10-CM | POA: Diagnosis not present

## 2013-05-17 DIAGNOSIS — L570 Actinic keratosis: Secondary | ICD-10-CM | POA: Diagnosis not present

## 2013-06-07 DIAGNOSIS — H35379 Puckering of macula, unspecified eye: Secondary | ICD-10-CM | POA: Diagnosis not present

## 2013-08-20 ENCOUNTER — Encounter: Payer: Self-pay | Admitting: Family Medicine

## 2013-08-27 DIAGNOSIS — M6740 Ganglion, unspecified site: Secondary | ICD-10-CM | POA: Diagnosis not present

## 2013-09-13 ENCOUNTER — Other Ambulatory Visit: Payer: Self-pay | Admitting: Family Medicine

## 2013-11-08 DIAGNOSIS — M659 Synovitis and tenosynovitis, unspecified: Secondary | ICD-10-CM | POA: Diagnosis not present

## 2013-11-19 ENCOUNTER — Other Ambulatory Visit: Payer: Self-pay

## 2013-11-19 DIAGNOSIS — Z1231 Encounter for screening mammogram for malignant neoplasm of breast: Secondary | ICD-10-CM

## 2013-11-24 ENCOUNTER — Other Ambulatory Visit: Payer: Self-pay | Admitting: Family Medicine

## 2013-12-18 ENCOUNTER — Ambulatory Visit
Admission: RE | Admit: 2013-12-18 | Discharge: 2013-12-18 | Disposition: A | Discharge status: Home / Self Care | Payer: Medicare Other | Source: Ambulatory Visit

## 2013-12-18 DIAGNOSIS — Z1231 Encounter for screening mammogram for malignant neoplasm of breast: Secondary | ICD-10-CM | POA: Diagnosis not present

## 2014-01-18 ENCOUNTER — Telehealth: Payer: Self-pay | Admitting: Family Medicine

## 2014-01-18 DIAGNOSIS — W57XXXA Bitten or stung by nonvenomous insect and other nonvenomous arthropods, initial encounter: Secondary | ICD-10-CM | POA: Diagnosis not present

## 2014-01-18 DIAGNOSIS — S30860A Insect bite (nonvenomous) of lower back and pelvis, initial encounter: Secondary | ICD-10-CM | POA: Diagnosis not present

## 2014-01-18 NOTE — Telephone Encounter (Signed)
Patient called to schedule an appt for possible tick bite. Patient was informed that we did not have any appointments but to follow up with [facility] or [facility].

## 2014-02-19 DIAGNOSIS — L821 Other seborrheic keratosis: Secondary | ICD-10-CM | POA: Diagnosis not present

## 2014-02-19 DIAGNOSIS — L819 Disorder of pigmentation, unspecified: Secondary | ICD-10-CM | POA: Diagnosis not present

## 2014-02-19 DIAGNOSIS — Z85828 Personal history of other malignant neoplasm of skin: Secondary | ICD-10-CM | POA: Diagnosis not present

## 2014-02-19 DIAGNOSIS — D239 Other benign neoplasm of skin, unspecified: Secondary | ICD-10-CM | POA: Diagnosis not present

## 2014-02-19 DIAGNOSIS — D1801 Hemangioma of skin and subcutaneous tissue: Secondary | ICD-10-CM | POA: Diagnosis not present

## 2014-02-19 DIAGNOSIS — L820 Inflamed seborrheic keratosis: Secondary | ICD-10-CM | POA: Diagnosis not present

## 2014-04-22 ENCOUNTER — Other Ambulatory Visit: Payer: Self-pay | Admitting: Family Medicine

## 2014-05-13 ENCOUNTER — Encounter: Payer: Self-pay | Admitting: Family Medicine

## 2014-05-13 ENCOUNTER — Ambulatory Visit (INDEPENDENT_AMBULATORY_CARE_PROVIDER_SITE_OTHER): Payer: Medicare Other | Admitting: Family Medicine

## 2014-05-13 VITALS — BP 140/80 | HR 77 | Temp 97.6°F | Wt 203.0 lb

## 2014-05-13 DIAGNOSIS — E785 Hyperlipidemia, unspecified: Secondary | ICD-10-CM

## 2014-05-13 DIAGNOSIS — E038 Other specified hypothyroidism: Secondary | ICD-10-CM

## 2014-05-13 DIAGNOSIS — I10 Essential (primary) hypertension: Secondary | ICD-10-CM

## 2014-05-13 DIAGNOSIS — Z23 Encounter for immunization: Secondary | ICD-10-CM

## 2014-05-13 LAB — BASIC METABOLIC PANEL
BUN: 20 mg/dL (ref 6–23)
CALCIUM: 9.7 mg/dL (ref 8.4–10.5)
CO2: 20 meq/L (ref 19–32)
Chloride: 108 mEq/L (ref 96–112)
Creatinine, Ser: 1.1 mg/dL (ref 0.4–1.2)
GFR: 51.87 mL/min — ABNORMAL LOW (ref >60.00)
GLUCOSE: 111 mg/dL — AB (ref 70–99)
POTASSIUM: 4.2 meq/L (ref 3.5–5.1)
SODIUM: 141 meq/L (ref 135–145)

## 2014-05-13 LAB — CBC WITH DIFFERENTIAL/PLATELET
BASOS ABS: 0 10*3/uL (ref 0.0–0.1)
Basophils Relative: 0.6 % (ref 0.0–3.0)
EOS ABS: 0.2 10*3/uL (ref 0.0–0.7)
Eosinophils Relative: 3.2 % (ref 0.0–5.0)
HEMATOCRIT: 42.9 % (ref 36.0–46.0)
Hemoglobin: 13.9 g/dL (ref 12.0–15.0)
LYMPHS ABS: 2.2 10*3/uL (ref 0.7–4.0)
Lymphocytes Relative: 35.6 % (ref 12.0–46.0)
MCHC: 32.3 g/dL (ref 30.0–36.0)
MCV: 76.5 fl — ABNORMAL LOW (ref 78.0–100.0)
Monocytes Absolute: 0.5 10*3/uL (ref 0.1–1.0)
Monocytes Relative: 8.6 % (ref 3.0–12.0)
Neutro Abs: 3.1 10*3/uL (ref 1.4–7.7)
Neutrophils Relative %: 52 % (ref 43.0–77.0)
Platelets: 245 10*3/uL (ref 150.0–400.0)
RBC: 5.6 Mil/uL — ABNORMAL HIGH (ref 3.87–5.11)
RDW: 13.8 % (ref 11.5–15.5)
WBC: 6 10*3/uL (ref 4.0–10.5)

## 2014-05-13 LAB — LIPID PANEL
CHOLESTEROL: 199 mg/dL (ref 0–200)
HDL: 42.9 mg/dL (ref >39.00)
LDL Cholesterol: 116 mg/dL — ABNORMAL HIGH (ref 0–99)
NonHDL: 156.1
Total CHOL/HDL Ratio: 5
Triglycerides: 200 mg/dL — ABNORMAL HIGH (ref 0.0–149.0)
VLDL: 40 mg/dL (ref 0.0–40.0)

## 2014-05-13 LAB — POCT URINALYSIS DIPSTICK
BILIRUBIN UA: NEGATIVE
Glucose, UA: NEGATIVE
KETONES UA: NEGATIVE
LEUKOCYTES UA: NEGATIVE
Nitrite, UA: NEGATIVE
PH UA: 6
Protein, UA: NEGATIVE
RBC UA: NEGATIVE
Spec Grav, UA: >=1.03
Urobilinogen, UA: 0.2

## 2014-05-13 LAB — HEPATIC FUNCTION PANEL
ALBUMIN: 3.7 g/dL (ref 3.5–5.2)
ALT: 33 U/L (ref 0–35)
AST: 30 U/L (ref 0–37)
Alkaline Phosphatase: 56 U/L (ref 39–117)
Bilirubin, Direct: 0 mg/dL (ref 0.0–0.3)
Total Bilirubin: 0.7 mg/dL (ref 0.2–1.2)
Total Protein: 7.5 g/dL (ref 6.0–8.3)

## 2014-05-13 LAB — TSH: TSH: 0.81 u[IU]/mL (ref 0.35–4.50)

## 2014-05-13 MED ORDER — VALSARTAN 320 MG PO TABS: ORAL_TABLET | ORAL | Status: DC

## 2014-05-13 MED ORDER — SIMVASTATIN 40 MG PO TABS: ORAL_TABLET | ORAL | Status: DC

## 2014-05-13 NOTE — Progress Notes (Signed)
Pre visit review using our clinic review tool, if applicable. No additional management support is needed unless otherwise documented below in the visit note. 

## 2014-05-13 NOTE — Patient Instructions (Addendum)

## 2014-05-13 NOTE — Progress Notes (Signed)
  Subjective:    Patient here for follow-up of elevated blood pressure.  She is exercising and is adherent to a low-salt diet.  Blood pressure is well controlled at home. Cardiac symptoms: none. Patient denies: chest pain, chest pressure/discomfort, claudication, dyspnea, exertional chest pressure/discomfort, fatigue, irregular heart beat, lower extremity edema, near-syncope, orthopnea, palpitations, paroxysmal nocturnal dyspnea, syncope and tachypnea. Cardiovascular risk factors: advanced age (older than 21 for men, 14 for women), dyslipidemia and hypertension. Use of agents associated with hypertension: none. History of target organ damage: none. Pt is also here to f/u cholesterol and thyroid.  Labs need to be drawn. The following portions of the patient's history were reviewed and updated as appropriate:  She  has a past medical history of Hyperlipidemia; Hypertension; ANXIETY; RESTLESS LEGS SYNDROME; GERD; Plantar fasciitis; and Hypothyroid. She  does not have any pertinent problems on file. She  has past surgical history that includes Abdominal hysterectomy and Breast surgery. Her family history includes Cancer in her sister. She  reports that she has never smoked. She has never used smokeless tobacco. She reports that she drinks alcohol. She reports that she does not use illicit drugs. She has a current medication list which includes the following prescription(s): vitamin c, vitamin d, ibuprofen, levothyroxine, omeprazole, simvastatin, and valsartan. Current Outpatient Prescriptions on File Prior to Visit  Medication Sig Dispense Refill  . Ascorbic Acid (VITAMIN C) 1000 MG tablet Take 1,000 mg by mouth daily.    . Cholecalciferol (VITAMIN D) 2000 UNITS CAPS Take by mouth.    Marland Kitchen ibuprofen (ADVIL,MOTRIN) 600 MG tablet Take 600 mg by mouth at bedtime as needed.      Marland Kitchen levothyroxine (SYNTHROID, LEVOTHROID) 112 MCG tablet TAKE 1 TABLET DAILY 90 tablet 0  . omeprazole (PRILOSEC) 20 MG capsule TAKE 1  CAPSULE EVERY MORNING 90 capsule 0   No current facility-administered medications on file prior to visit.   She is allergic to hydrochlorothiazide; hydrocodone-acetaminophen; lisinopril; and penicillins..  Review of Systems Pertinent items are noted in HPI.     Objective:    BP 140/80 mmHg  Pulse 77  Temp(Src) 97.6 F (36.4 C) (Oral)  Wt 203 lb (92.08 kg)  SpO2 97% General appearance: alert, cooperative, appears stated age and no distress Throat: lips, mucosa, and tongue normal; teeth and gums normal Neck: no adenopathy, no carotid bruit, no JVD, supple, symmetrical, trachea midline and thyroid not enlarged, symmetric, no tenderness/mass/nodules Lungs: clear to auscultation bilaterally Heart: S1, S2 normal Extremities: extremities normal, atraumatic, no cyanosis or edema    Assessment:    Hypertension, normal blood pressure . Evidence of target organ damage: none.    Plan:    Medication: no change. Dietary sodium restriction. Regular aerobic exercise. Follow up: 6 months and as needed.    1. Need for prophylactic vaccination and inoculation against influenza  - Flu Vaccine QUAD 36+ mos PF IM (Fluarix Quad PF)  2. Other specified hypothyroidism Check labs - TSH  3. Essential hypertension Stable, con't meds - Basic metabolic panel - CBC with Differential - POCT urinalysis dipstick - valsartan (DIOVAN) 320 MG tablet; 1 tab by mouth daily-  Dispense: 90 tablet; Refill: 0  4. Hyperlipidemia Check labs and con't meds - Hepatic function panel - Lipid panel - POCT urinalysis dipstick - simvastatin (ZOCOR) 40 MG tablet; TAKE 1 TABLET AT BEDTIME  Dispense: 90 tablet; Refill: 0  5. Need for pneumococcal vaccination  - Pneumococcal conjugate vaccine 13-valent

## 2014-07-02 ENCOUNTER — Other Ambulatory Visit: Payer: Self-pay | Admitting: Family Medicine

## 2014-07-04 ENCOUNTER — Other Ambulatory Visit: Payer: Self-pay | Admitting: Family Medicine

## 2014-07-30 ENCOUNTER — Other Ambulatory Visit: Payer: Self-pay | Admitting: Obstetrics and Gynecology

## 2014-07-30 DIAGNOSIS — Z01419 Encounter for gynecological examination (general) (routine) without abnormal findings: Secondary | ICD-10-CM | POA: Diagnosis not present

## 2014-07-30 DIAGNOSIS — R32 Unspecified urinary incontinence: Secondary | ICD-10-CM | POA: Diagnosis not present

## 2014-08-01 LAB — CYTOLOGY - PAP

## 2014-10-15 ENCOUNTER — Telehealth: Payer: Self-pay | Admitting: Family Medicine

## 2014-10-15 NOTE — Telephone Encounter (Signed)
Pt lives down the street from our office and would like to know if ok if you will accept her as a pt? Dr Etter Sjogren is over in [place] and too far for pt to drive. Pt had acquired several months ago and has been waiting for you to arrive. Will you accept?  Is it ok w/ you dr Etter Sjogren?

## 2014-10-15 NOTE — Telephone Encounter (Signed)
Ok with me 

## 2014-10-15 NOTE — Telephone Encounter (Signed)
That is fine with me.

## 2014-10-18 ENCOUNTER — Ambulatory Visit (INDEPENDENT_AMBULATORY_CARE_PROVIDER_SITE_OTHER): Payer: Medicare Other | Admitting: Adult Health

## 2014-10-18 ENCOUNTER — Encounter: Payer: Self-pay | Admitting: Adult Health

## 2014-10-18 VITALS — BP 128/88 | HR 91 | Temp 97.9°F | Ht 62.5 in | Wt 206.3 lb

## 2014-10-18 DIAGNOSIS — Z7189 Other specified counseling: Secondary | ICD-10-CM | POA: Diagnosis not present

## 2014-10-18 DIAGNOSIS — M25511 Pain in right shoulder: Secondary | ICD-10-CM | POA: Diagnosis not present

## 2014-10-18 DIAGNOSIS — G720 Drug-induced myopathy: Secondary | ICD-10-CM | POA: Diagnosis not present

## 2014-10-18 DIAGNOSIS — T466X5A Adverse effect of antihyperlipidemic and antiarteriosclerotic drugs, initial encounter: Secondary | ICD-10-CM

## 2014-10-18 DIAGNOSIS — Z7689 Persons encountering health services in other specified circumstances: Secondary | ICD-10-CM

## 2014-10-18 MED ORDER — NAPROXEN 500 MG PO TABS: 500.0000 mg | ORAL_TABLET | Freq: Two times a day (BID) | ORAL | Status: DC

## 2014-10-18 MED ORDER — CYCLOBENZAPRINE HCL 5 MG PO TABS: 5.0000 mg | ORAL_TABLET | Freq: Three times a day (TID) | ORAL | Status: DC | PRN

## 2014-10-18 NOTE — Addendum Note (Signed)
Addended by: Colleen Can on: 10/18/2014 04:23 PM   Modules accepted: Orders

## 2014-10-18 NOTE — Progress Notes (Signed)
HPI:  Hannah Morrow is here to establish care. She is a very pleasant lady who was a patient on Dr. Laury Axon. She appears very up to date on her health and sees all of her doctors regularly.   Last PCP and physical: November of last year with Dr.  Maurice Small Dental Visit: Goes multiple times a year for gum issues Last Eye exam: Last year, due in July.  Last GYN exam: 2015  Has the following chronic problems that require follow up and concerns today:  Shoulder pain.  Woke up three months ago with right shoulder pain. Pain has been getting better and she is able to raise her arm now, something that she was not able to do at the beginning of her injury.   Has had a recent fall ( three weeks ago) onto ground ( tripped over feet) hurt right wrist, arm and shoulder. Pain in shoulder/upper arm still but pain in hand and wrist has resolved.   Myalgias She also complains of possible myalgias from statin therapy. Per patient she has a pinching pain in bilateral forearms, this is not a constant problem and only happens on occasion.    ROS negative for unless reported above: fevers, unintentional weight loss, hearing or vision loss, chest pain, palpitations, struggling to breath, hemoptysis, melena, hematochezia, hematuria, falls, loc, si, thoughts of self harm  Past Medical History  Diagnosis Date  . Hyperlipidemia   . Hypertension   . ANXIETY   . RESTLESS LEGS SYNDROME   . GERD   . Plantar fasciitis   . Hypothyroid     Past Surgical History  Procedure Laterality Date  . Abdominal hysterectomy    . Breast surgery      Bilateral breast reduction    Family History  Problem Relation Age of Onset  . Cancer Sister     melanoma- with mets    History   Social History  . Marital Status: Married    Spouse Name: N/A  . Number of Children: 2  . Years of Education: N/A   Social History Main Topics  . Smoking status: Never Smoker   . Smokeless tobacco: Never Used  . Alcohol Use: Yes      Comment: Occasional  . Drug Use: No  . Sexual Activity:    Partners: Male   Other Topics Concern  . None   Social History Narrative   Married for 35 years   Two daughters, five grand children and 5 great grand children. Daughter lives in [place]. Other daughter lives in [place].    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    Exercise Does not exercise. Was going to exercise class but has since quite.    Diet: Veggies, chicken, salmon. Very little red meat. Eats lots of eggs.    Exercise --- walking , gym 3 classes a week     Current outpatient prescriptions:  .  Ascorbic Acid (VITAMIN C) 1000 MG tablet, Take 1,000 mg by mouth daily., Disp: , Rfl:  .  calcium carbonate (TUMS - DOSED IN MG ELEMENTAL CALCIUM) 500 MG chewable tablet, Chew 1 tablet by mouth daily., Disp: , Rfl:  .  Cholecalciferol (VITAMIN D) 2000 UNITS CAPS, Take by mouth., Disp: , Rfl:  .  ibuprofen (ADVIL,MOTRIN) 600 MG tablet, Take 600 mg by mouth at bedtime as needed.  , Disp: , Rfl:  .  levothyroxine (SYNTHROID, LEVOTHROID) 112 MCG  tablet, TAKE 1 TABLET DAILY, Disp: 90 tablet, Rfl: 1 .  ranitidine (ZANTAC) 150 MG capsule, Take 150 mg by mouth as needed for heartburn., Disp: , Rfl:  .  simvastatin (ZOCOR) 40 MG tablet, Take 1 tablet (40 mg total) by mouth at bedtime., Disp: 90 tablet, Rfl: 1 .  valsartan (DIOVAN) 320 MG tablet, Take 1 tablet (320 mg total) by mouth daily., Disp: 90 tablet, Rfl: 1 .  cyclobenzaprine (FLEXERIL) 5 MG tablet, Take 1 tablet (5 mg total) by mouth 3 (three) times daily as needed for muscle spasms., Disp: 30 tablet, Rfl: 1 .  naproxen (NAPROSYN) 500 MG tablet, Take 1 tablet (500 mg total) by mouth 2 (two) times daily with a meal., Disp: 30 tablet, Rfl: 0  EXAM:  Filed Vitals:   10/18/14 0952  BP: 128/88  Pulse: 91  Temp: 97.9 F (36.6 C)    Body mass index is 37.11 kg/(m^2).  GENERAL: vitals reviewed and listed above, alert,  oriented, appears well hydrated and in no acute distress. She is overweight.   HEENT: atraumatic, conjunttiva clear, no obvious abnormalities on inspection of external nose and ears  NECK: no obvious masses on inspection  LUNGS: clear to auscultation bilaterally, no wheezes, rales or rhonchi, good air movement  CV: HRRR, no peripheral edema  MS: moves all extremities without noticeable abnormality. Has normal ROM in right arm. Does have pain in upper arm with palpation. No masses, no warmth, no deformity noted.   PSYCH: pleasant and cooperative, no obvious depression or anxiety  ASSESSMENT AND PLAN:  Discussed the following assessment and plan:  Shoulder Pain/Upper Arm pain - Prescribed Naproxen and Flexeril  - Use ice and elevate arm - Rest right arm and right shoulder - Has full ROM - no indication for imagine - Follow up if no improvement - Consider sports medicine or ortho consult.   Statin Myopathy - I am not convinced this is from statin therapy - Advised to half dose of Simvastatin - Follow up in two weeks if no improvement  Health Maintenance - Follow up in November for Medicare Wellness Exam - Continue to current diet - Start exercise program       -We reviewed the PMH, PSH, FH, SH, Meds and Allergies. -We provided refills for any medications we will prescribe as needed. -We addressed current concerns per orders and patient instructions. -We have advised patient to follow up per instructions below.   -Patient advised to return or notify a provider immediately if symptoms worsen or persist or new concerns arise.  Patient Instructions  It was great meeting you today! Follow up with me in November for a complete physical. Take the Naproxen and Flexeril for your arm pain. If it does not work then please let me know. Continue to eat well and I am proud of you for restarting your exercise program! If you need anything before November, please let me know. Welcome to  the team!   Health Maintenance Adopting a healthy lifestyle and getting preventive care can go a long way to promote health and wellness. Talk with your health care provider about what schedule of regular examinations is right for you. This is a good chance for you to check in with your provider about disease prevention and staying healthy. In between checkups, there are plenty of things you can do on your own. Experts have done a lot of research about which lifestyle changes and preventive measures are most likely to keep you healthy. Ask your  health care provider for more information. WEIGHT AND DIET  Eat a healthy diet  Be sure to include plenty of vegetables, fruits, low-fat dairy products, and lean protein.  Do not eat a lot of foods high in solid fats, added sugars, or salt.  Get regular exercise. This is one of the most important things you can do for your health.  Most adults should exercise for at least 150 minutes each week. The exercise should increase your heart rate and make you sweat (moderate-intensity exercise).  Most adults should also do strengthening exercises at least twice a week. This is in addition to the moderate-intensity exercise.  Maintain a healthy weight  Body mass index (BMI) is a measurement that can be used to identify possible weight problems. It estimates body fat based on height and weight. Your health care provider can help determine your BMI and help you achieve or maintain a healthy weight.  For females 36 years of age and older:   A BMI below 18.5 is considered underweight.  A BMI of 18.5 to 24.9 is normal.  A BMI of 25 to 29.9 is considered overweight.  A BMI of 30 and above is considered obese.  Watch levels of cholesterol and blood lipids  You should start having your blood tested for lipids and cholesterol at 73 years of age, then have this test every 5 years.  You may need to have your cholesterol levels checked more often if:  Your  lipid or cholesterol levels are high.  You are older than 73 years of age.  You are at high risk for heart disease.  CANCER SCREENING   Lung Cancer  Lung cancer screening is recommended for adults 11-9 years old who are at high risk for lung cancer because of a history of smoking.  A yearly low-dose CT scan of the lungs is recommended for people who:  Currently smoke.  Have quit within the past 15 years.  Have at least a 30-pack-year history of smoking. A pack year is smoking an average of one pack of cigarettes a day for 1 year.  Yearly screening should continue until it has been 15 years since you quit.  Yearly screening should stop if you develop a health problem that would prevent you from having lung cancer treatment.  Breast Cancer  Practice breast self-awareness. This means understanding how your breasts normally appear and feel.  It also means doing regular breast self-exams. Let your health care provider know about any changes, no matter how small.  If you are in your 20s or 30s, you should have a clinical breast exam (CBE) by a health care provider every 1-3 years as part of a regular health exam.  If you are 25 or older, have a CBE every year. Also consider having a breast X-ray (mammogram) every year.  If you have a family history of breast cancer, talk to your health care provider about genetic screening.  If you are at high risk for breast cancer, talk to your health care provider about having an MRI and a mammogram every year.  Breast cancer gene (BRCA) assessment is recommended for women who have family members with BRCA-related cancers. BRCA-related cancers include:  Breast.  Ovarian.  Tubal.  Peritoneal cancers.  Results of the assessment will determine the need for genetic counseling and BRCA1 and BRCA2 testing. Cervical Cancer Routine pelvic examinations to screen for cervical cancer are no longer recommended for nonpregnant women who are  considered low risk for cancer  of the pelvic organs (ovaries, uterus, and vagina) and who do not have symptoms. A pelvic examination may be necessary if you have symptoms including those associated with pelvic infections. Ask your health care provider if a screening pelvic exam is right for you.   The Pap test is the screening test for cervical cancer for women who are considered at risk.  If you had a hysterectomy for a problem that was not cancer or a condition that could lead to cancer, then you no longer need Pap tests.  If you are older than 65 years, and you have had normal Pap tests for the past 10 years, you no longer need to have Pap tests.  If you have had past treatment for cervical cancer or a condition that could lead to cancer, you need Pap tests and screening for cancer for at least 20 years after your treatment.  If you no longer get a Pap test, assess your risk factors if they change (such as having a new sexual partner). This can affect whether you should start being screened again.  Some women have medical problems that increase their chance of getting cervical cancer. If this is the case for you, your health care provider may recommend more frequent screening and Pap tests.  The human papillomavirus (HPV) test is another test that may be used for cervical cancer screening. The HPV test looks for the virus that can cause cell changes in the cervix. The cells collected during the Pap test can be tested for HPV.  The HPV test can be used to screen women 41 years of age and older. Getting tested for HPV can extend the interval between normal Pap tests from three to five years.  An HPV test also should be used to screen women of any age who have unclear Pap test results.  After 74 years of age, women should have HPV testing as often as Pap tests.  Colorectal Cancer  This type of cancer can be detected and often prevented.  Routine colorectal cancer screening usually begins at  73 years of age and continues through 73 years of age.  Your health care provider may recommend screening at an earlier age if you have risk factors for colon cancer.  Your health care provider may also recommend using home test kits to check for hidden blood in the stool.  A small camera at the end of a tube can be used to examine your colon directly (sigmoidoscopy or colonoscopy). This is done to check for the earliest forms of colorectal cancer.  Routine screening usually begins at age 45.  Direct examination of the colon should be repeated every 5-10 years through 73 years of age. However, you may need to be screened more often if early forms of precancerous polyps or small growths are found. Skin Cancer  Check your skin from head to toe regularly.  Tell your health care provider about any new moles or changes in moles, especially if there is a change in a mole's shape or color.  Also tell your health care provider if you have a mole that is larger than the size of a pencil eraser.  Always use sunscreen. Apply sunscreen liberally and repeatedly throughout the day.  Protect yourself by wearing long sleeves, pants, a wide-brimmed hat, and sunglasses whenever you are outside. HEART DISEASE, DIABETES, AND HIGH BLOOD PRESSURE   Have your blood pressure checked at least every 1-2 years. High blood pressure causes heart disease and increases the  risk of stroke.  If you are between 40 years and 18 years old, ask your health care provider if you should take aspirin to prevent strokes.  Have regular diabetes screenings. This involves taking a blood sample to check your fasting blood sugar level.  If you are at a normal weight and have a low risk for diabetes, have this test once every three years after 73 years of age.  If you are overweight and have a high risk for diabetes, consider being tested at a younger age or more often. PREVENTING INFECTION  Hepatitis B  If you have a higher  risk for hepatitis B, you should be screened for this virus. You are considered at high risk for hepatitis B if:  You were born in a country where hepatitis B is common. Ask your health care provider which countries are considered high risk.  Your parents were born in a high-risk country, and you have not been immunized against hepatitis B (hepatitis B vaccine).  You have HIV or AIDS.  You use needles to inject street drugs.  You live with someone who has hepatitis B.  You have had sex with someone who has hepatitis B.  You get hemodialysis treatment.  You take certain medicines for conditions, including cancer, organ transplantation, and autoimmune conditions. Hepatitis C  Blood testing is recommended for:  Everyone born from [birth year] through 1964.  Anyone with known risk factors for hepatitis C. Sexually transmitted infections (STIs)  You should be screened for sexually transmitted infections (STIs) including gonorrhea and chlamydia if:  You are sexually active and are younger than 73 years of age.  You are older than 73 years of age and your health care provider tells you that you are at risk for this type of infection.  Your sexual activity has changed since you were last screened and you are at an increased risk for chlamydia or gonorrhea. Ask your health care provider if you are at risk.  If you do not have HIV, but are at risk, it may be recommended that you take a prescription medicine daily to prevent HIV infection. This is called pre-exposure prophylaxis (PrEP). You are considered at risk if:  You are sexually active and do not regularly use condoms or know the HIV status of your partner(s).  You take drugs by injection.  You are sexually active with a partner who has HIV. Talk with your health care provider about whether you are at high risk of being infected with HIV. If you choose to begin PrEP, you should first be tested for HIV. You should then be tested every 3  months for as long as you are taking PrEP.  PREGNANCY   If you are premenopausal and you may become pregnant, ask your health care provider about preconception counseling.  If you may become pregnant, take 400 to 800 micrograms (mcg) of folic acid every day.  If you want to prevent pregnancy, talk to your health care provider about birth control (contraception). OSTEOPOROSIS AND MENOPAUSE   Osteoporosis is a disease in which the bones lose minerals and strength with aging. This can result in serious bone fractures. Your risk for osteoporosis can be identified using a bone density scan.  If you are 75 years of age or older, or if you are at risk for osteoporosis and fractures, ask your health care provider if you should be screened.  Ask your health care provider whether you should take a calcium or vitamin D supplement to  lower your risk for osteoporosis.  Menopause may have certain physical symptoms and risks.  Hormone replacement therapy may reduce some of these symptoms and risks. Talk to your health care provider about whether hormone replacement therapy is right for you.  HOME CARE INSTRUCTIONS   Schedule regular health, dental, and eye exams.  Stay current with your immunizations.   Do not use any tobacco products including cigarettes, chewing tobacco, or electronic cigarettes.  If you are pregnant, do not drink alcohol.  If you are breastfeeding, limit how much and how often you drink alcohol.  Limit alcohol intake to no more than 1 drink per day for nonpregnant women. One drink equals 12 ounces of beer, 5 ounces of wine, or 1 ounces of hard liquor.  Do not use street drugs.  Do not share needles.  Ask your health care provider for help if you need support or information about quitting drugs.  Tell your health care provider if you often feel depressed.  Tell your health care provider if you have ever been abused or do not feel safe at home. Document Released:  01/04/2011 Document Revised: 11/05/2013 Document Reviewed: 05/23/2013 [facility] Patient Information 2015 [place], [place]. This information is not intended to replace advice given to you by your health care provider. Make sure you discuss any questions you have with your health care provider.      [employer]

## 2014-10-18 NOTE — Progress Notes (Signed)
Pre visit review using our clinic review tool, if applicable. No additional management support is needed unless otherwise documented below in the visit note. 

## 2014-10-18 NOTE — Patient Instructions (Signed)
It was great meeting you today! Follow up with me in November for a complete physical. Take the Naproxen and Flexeril for your arm pain. If it does not work then please let me know. Continue to eat well and I am proud of you for restarting your exercise program! If you need anything before November, please let me know. Welcome to the team!   Health Maintenance Adopting a healthy lifestyle and getting preventive care can go a long way to promote health and wellness. Talk with your health care provider about what schedule of regular examinations is right for you. This is a good chance for you to check in with your provider about disease prevention and staying healthy. In between checkups, there are plenty of things you can do on your own. Experts have done a lot of research about which lifestyle changes and preventive measures are most likely to keep you healthy. Ask your health care provider for more information. WEIGHT AND DIET  Eat a healthy diet  Be sure to include plenty of vegetables, fruits, low-fat dairy products, and lean protein.  Do not eat a lot of foods high in solid fats, added sugars, or salt.  Get regular exercise. This is one of the most important things you can do for your health.  Most adults should exercise for at least 150 minutes each week. The exercise should increase your heart rate and make you sweat (moderate-intensity exercise).  Most adults should also do strengthening exercises at least twice a week. This is in addition to the moderate-intensity exercise.  Maintain a healthy weight  Body mass index (BMI) is a measurement that can be used to identify possible weight problems. It estimates body fat based on height and weight. Your health care provider can help determine your BMI and help you achieve or maintain a healthy weight.  For females 14 years of age and older:   A BMI below 18.5 is considered underweight.  A BMI of 18.5 to 24.9 is normal.  A BMI of 25 to  29.9 is considered overweight.  A BMI of 30 and above is considered obese.  Watch levels of cholesterol and blood lipids  You should start having your blood tested for lipids and cholesterol at 73 years of age, then have this test every 5 years.  You may need to have your cholesterol levels checked more often if:  Your lipid or cholesterol levels are high.  You are older than 73 years of age.  You are at high risk for heart disease.  CANCER SCREENING   Lung Cancer  Lung cancer screening is recommended for adults 76-16 years old who are at high risk for lung cancer because of a history of smoking.  A yearly low-dose CT scan of the lungs is recommended for people who:  Currently smoke.  Have quit within the past 15 years.  Have at least a 30-pack-year history of smoking. A pack year is smoking an average of one pack of cigarettes a day for 1 year.  Yearly screening should continue until it has been 15 years since you quit.  Yearly screening should stop if you develop a health problem that would prevent you from having lung cancer treatment.  Breast Cancer  Practice breast self-awareness. This means understanding how your breasts normally appear and feel.  It also means doing regular breast self-exams. Let your health care provider know about any changes, no matter how small.  If you are in your 20s or 30s, you  should have a clinical breast exam (CBE) by a health care provider every 1-3 years as part of a regular health exam.  If you are 75 or older, have a CBE every year. Also consider having a breast X-ray (mammogram) every year.  If you have a family history of breast cancer, talk to your health care provider about genetic screening.  If you are at high risk for breast cancer, talk to your health care provider about having an MRI and a mammogram every year.  Breast cancer gene (BRCA) assessment is recommended for women who have family members with BRCA-related  cancers. BRCA-related cancers include:  Breast.  Ovarian.  Tubal.  Peritoneal cancers.  Results of the assessment will determine the need for genetic counseling and BRCA1 and BRCA2 testing. Cervical Cancer Routine pelvic examinations to screen for cervical cancer are no longer recommended for nonpregnant women who are considered low risk for cancer of the pelvic organs (ovaries, uterus, and vagina) and who do not have symptoms. A pelvic examination may be necessary if you have symptoms including those associated with pelvic infections. Ask your health care provider if a screening pelvic exam is right for you.   The Pap test is the screening test for cervical cancer for women who are considered at risk.  If you had a hysterectomy for a problem that was not cancer or a condition that could lead to cancer, then you no longer need Pap tests.  If you are older than 65 years, and you have had normal Pap tests for the past 10 years, you no longer need to have Pap tests.  If you have had past treatment for cervical cancer or a condition that could lead to cancer, you need Pap tests and screening for cancer for at least 20 years after your treatment.  If you no longer get a Pap test, assess your risk factors if they change (such as having a new sexual partner). This can affect whether you should start being screened again.  Some women have medical problems that increase their chance of getting cervical cancer. If this is the case for you, your health care provider may recommend more frequent screening and Pap tests.  The human papillomavirus (HPV) test is another test that may be used for cervical cancer screening. The HPV test looks for the virus that can cause cell changes in the cervix. The cells collected during the Pap test can be tested for HPV.  The HPV test can be used to screen women 30 years of age and older. Getting tested for HPV can extend the interval between normal Pap tests from  three to five years.  An HPV test also should be used to screen women of any age who have unclear Pap test results.  After 73 years of age, women should have HPV testing as often as Pap tests.  Colorectal Cancer  This type of cancer can be detected and often prevented.  Routine colorectal cancer screening usually begins at 73 years of age and continues through 73 years of age.  Your health care provider may recommend screening at an earlier age if you have risk factors for colon cancer.  Your health care provider may also recommend using home test kits to check for hidden blood in the stool.  A small camera at the end of a tube can be used to examine your colon directly (sigmoidoscopy or colonoscopy). This is done to check for the earliest forms of colorectal cancer.  Routine screening  usually begins at age 53.  Direct examination of the colon should be repeated every 5-10 years through 72 years of age. However, you may need to be screened more often if early forms of precancerous polyps or small growths are found. Skin Cancer  Check your skin from head to toe regularly.  Tell your health care provider about any new moles or changes in moles, especially if there is a change in a mole's shape or color.  Also tell your health care provider if you have a mole that is larger than the size of a pencil eraser.  Always use sunscreen. Apply sunscreen liberally and repeatedly throughout the day.  Protect yourself by wearing long sleeves, pants, a wide-brimmed hat, and sunglasses whenever you are outside. HEART DISEASE, DIABETES, AND HIGH BLOOD PRESSURE   Have your blood pressure checked at least every 1-2 years. High blood pressure causes heart disease and increases the risk of stroke.  If you are between 25 years and 96 years old, ask your health care provider if you should take aspirin to prevent strokes.  Have regular diabetes screenings. This involves taking a blood sample to check  your fasting blood sugar level.  If you are at a normal weight and have a low risk for diabetes, have this test once every three years after 73 years of age.  If you are overweight and have a high risk for diabetes, consider being tested at a younger age or more often. PREVENTING INFECTION  Hepatitis B  If you have a higher risk for hepatitis B, you should be screened for this virus. You are considered at high risk for hepatitis B if:  You were born in a country where hepatitis B is common. Ask your health care provider which countries are considered high risk.  Your parents were born in a high-risk country, and you have not been immunized against hepatitis B (hepatitis B vaccine).  You have HIV or AIDS.  You use needles to inject street drugs.  You live with someone who has hepatitis B.  You have had sex with someone who has hepatitis B.  You get hemodialysis treatment.  You take certain medicines for conditions, including cancer, organ transplantation, and autoimmune conditions. Hepatitis C  Blood testing is recommended for:  Everyone born from [birth year] through 1964.  Anyone with known risk factors for hepatitis C. Sexually transmitted infections (STIs)  You should be screened for sexually transmitted infections (STIs) including gonorrhea and chlamydia if:  You are sexually active and are younger than 73 years of age.  You are older than 73 years of age and your health care provider tells you that you are at risk for this type of infection.  Your sexual activity has changed since you were last screened and you are at an increased risk for chlamydia or gonorrhea. Ask your health care provider if you are at risk.  If you do not have HIV, but are at risk, it may be recommended that you take a prescription medicine daily to prevent HIV infection. This is called pre-exposure prophylaxis (PrEP). You are considered at risk if:  You are sexually active and do not regularly use  condoms or know the HIV status of your partner(s).  You take drugs by injection.  You are sexually active with a partner who has HIV. Talk with your health care provider about whether you are at high risk of being infected with HIV. If you choose to begin PrEP, you should first be tested  for HIV. You should then be tested every 3 months for as long as you are taking PrEP.  PREGNANCY   If you are premenopausal and you may become pregnant, ask your health care provider about preconception counseling.  If you may become pregnant, take 400 to 800 micrograms (mcg) of folic acid every day.  If you want to prevent pregnancy, talk to your health care provider about birth control (contraception). OSTEOPOROSIS AND MENOPAUSE   Osteoporosis is a disease in which the bones lose minerals and strength with aging. This can result in serious bone fractures. Your risk for osteoporosis can be identified using a bone density scan.  If you are 61 years of age or older, or if you are at risk for osteoporosis and fractures, ask your health care provider if you should be screened.  Ask your health care provider whether you should take a calcium or vitamin D supplement to lower your risk for osteoporosis.  Menopause may have certain physical symptoms and risks.  Hormone replacement therapy may reduce some of these symptoms and risks. Talk to your health care provider about whether hormone replacement therapy is right for you.  HOME CARE INSTRUCTIONS   Schedule regular health, dental, and eye exams.  Stay current with your immunizations.   Do not use any tobacco products including cigarettes, chewing tobacco, or electronic cigarettes.  If you are pregnant, do not drink alcohol.  If you are breastfeeding, limit how much and how often you drink alcohol.  Limit alcohol intake to no more than 1 drink per day for nonpregnant women. One drink equals 12 ounces of beer, 5 ounces of wine, or 1 ounces of hard  liquor.  Do not use street drugs.  Do not share needles.  Ask your health care provider for help if you need support or information about quitting drugs.  Tell your health care provider if you often feel depressed.  Tell your health care provider if you have ever been abused or do not feel safe at home. Document Released: 01/04/2011 Document Revised: 11/05/2013 Document Reviewed: 05/23/2013 [facility] Patient Information 2015 [place], [place]. This information is not intended to replace advice given to you by your health care provider. Make sure you discuss any questions you have with your health care provider.

## 2014-11-15 ENCOUNTER — Encounter: Payer: Self-pay | Admitting: Gastroenterology

## 2014-11-25 ENCOUNTER — Encounter: Payer: Self-pay | Admitting: Adult Health

## 2014-11-25 ENCOUNTER — Ambulatory Visit (INDEPENDENT_AMBULATORY_CARE_PROVIDER_SITE_OTHER): Payer: Medicare Other | Admitting: Adult Health

## 2014-11-25 VITALS — BP 138/88 | Temp 98.1°F | Ht 62.5 in | Wt 204.6 lb

## 2014-11-25 DIAGNOSIS — M5431 Sciatica, right side: Secondary | ICD-10-CM

## 2014-11-25 MED ORDER — METHYLPREDNISOLONE ACETATE 80 MG/ML IJ SUSP
80.0000 mg | Freq: Once | INTRAMUSCULAR | Status: AC
  Administered 2014-11-25: 80 mg via INTRAMUSCULAR

## 2014-11-25 NOTE — Progress Notes (Signed)
Pre visit review using our clinic review tool, if applicable. No additional management support is needed unless otherwise documented below in the visit note. 

## 2014-11-25 NOTE — Patient Instructions (Addendum)
It sounds like you have a pinched nerve, possibly the sciatic nerve. Continue to take tylenol. The steroid shot will help with inflammation and should provide some relief within the next 24 hours. Alternate heat and ice to the area of pain. If you change your mind and want to speak to a neurologist, please let me know. If you are not feeling any better, please let me know. Sciatica Sciatica is pain, weakness, numbness, or tingling along the path of the sciatic nerve. The nerve starts in the lower back and runs down the back of each leg. The nerve controls the muscles in the lower leg and in the back of the knee, while also providing sensation to the back of the thigh, lower leg, and the sole of your foot. Sciatica is a symptom of another medical condition. For instance, nerve damage or certain conditions, such as a herniated disk or bone spur on the spine, pinch or put pressure on the sciatic nerve. This causes the pain, weakness, or other sensations normally associated with sciatica. Generally, sciatica only affects one side of the body. CAUSES   Herniated or slipped disc.  Degenerative disk disease.  A pain disorder involving the narrow muscle in the buttocks (piriformis syndrome).  Pelvic injury or fracture.  Pregnancy.  Tumor (rare). SYMPTOMS  Symptoms can vary from mild to very severe. The symptoms usually travel from the low back to the buttocks and down the back of the leg. Symptoms can include:  Mild tingling or dull aches in the lower back, leg, or hip.  Numbness in the back of the calf or sole of the foot.  Burning sensations in the lower back, leg, or hip.  Sharp pains in the lower back, leg, or hip.  Leg weakness.  Severe back pain inhibiting movement. These symptoms may get worse with coughing, sneezing, laughing, or prolonged sitting or standing. Also, being overweight may worsen symptoms. DIAGNOSIS  Your caregiver will perform a physical exam to look for common symptoms  of sciatica. He or she may ask you to do certain movements or activities that would trigger sciatic nerve pain. Other tests may be performed to find the cause of the sciatica. These may include:  Blood tests.  X-rays.  Imaging tests, such as an MRI or CT scan. TREATMENT  Treatment is directed at the cause of the sciatic pain. Sometimes, treatment is not necessary and the pain and discomfort goes away on its own. If treatment is needed, your caregiver may suggest:  Over-the-counter medicines to relieve pain.  Prescription medicines, such as anti-inflammatory medicine, muscle relaxants, or narcotics.  Applying heat or ice to the painful area.  Steroid injections to lessen pain, irritation, and inflammation around the nerve.  Reducing activity during periods of pain.  Exercising and stretching to strengthen your abdomen and improve flexibility of your spine. Your caregiver may suggest losing weight if the extra weight makes the back pain worse.  Physical therapy.  Surgery to eliminate what is pressing or pinching the nerve, such as a bone spur or part of a herniated disk. HOME CARE INSTRUCTIONS   Only take over-the-counter or prescription medicines for pain or discomfort as directed by your caregiver.  Apply ice to the affected area for 20 minutes, 3-4 times a day for the first 48-72 hours. Then try heat in the same way.  Exercise, stretch, or perform your usual activities if these do not aggravate your pain.  Attend physical therapy sessions as directed by your caregiver.  Keep  all follow-up appointments as directed by your caregiver.  Do not wear high heels or shoes that do not provide proper support.  Check your mattress to see if it is too soft. A firm mattress may lessen your pain and discomfort. SEEK IMMEDIATE MEDICAL CARE IF:   You lose control of your bowel or bladder (incontinence).  You have increasing weakness in the lower back, pelvis, buttocks, or legs.  You  have redness or swelling of your back.  You have a burning sensation when you urinate.  You have pain that gets worse when you lie down or awakens you at night.  Your pain is worse than you have experienced in the past.  Your pain is lasting longer than 4 weeks.  You are suddenly losing weight without reason. MAKE SURE YOU:  Understand these instructions.  Will watch your condition.  Will get help right away if you are not doing well or get worse. Document Released: 06/15/2001 Document Revised: 12/21/2011 Document Reviewed: 10/31/2011 [facility] Patient Information 2015 [place], [place]. This information is not intended to replace advice given to you by your health care provider. Make sure you discuss any questions you have with your health care provider.

## 2014-11-25 NOTE — Progress Notes (Signed)
Subjective:    Patient ID: Hannah Morrow, female    DOB: August 08, 1940, 74 y.o.   MRN: [id number]  HPI  Patient presents to the office today for pain in the right leg. The pain has been getting worse over the last month. She endorses " achy pain" on the outside of the right leg. On the inside of her leg it feels " burning." The lateral pain radiates from her back down to her knee. The medial pain radiates down the inside of her thigh.  The pain only comes on when she is laying in bed. When she is sitting up or walking around the pain is not there. Has tried Narproxen, Ibuprofen, Flexeril - with minimal relief. She endorses that she has not been sleeping well due to the pain.   During her last visit in the office on 10/18/2014 we had her cut down on her statin drug. She went off it completely for about two weeks and did not notice any difference.   She does endorse that this pain has happened to her in the past on the opposite leg, that eventually went away.     Review of Systems  Constitutional: Negative for fever, activity change and fatigue.  Musculoskeletal: Positive for joint swelling. Negative for myalgias, arthralgias, gait problem, neck pain and neck stiffness.  Skin: Negative for color change, pallor, rash and wound.  All other systems reviewed and are negative.  Past Medical History  Diagnosis Date  . Hyperlipidemia   . Hypertension   . ANXIETY   . RESTLESS LEGS SYNDROME   . GERD   . Plantar fasciitis   . Hypothyroid     History   Social History  . Marital Status: Married    Spouse Name: N/A  . Number of Children: 2  . Years of Education: N/A   Occupational History  . Not on file.   Social History Main Topics  . Smoking status: Never Smoker   . Smokeless tobacco: Never Used  . Alcohol Use: Yes     Comment: Occasional  . Drug Use: No  . Sexual Activity:    Partners: Male   Other Topics Concern  . Not on file   Social History Narrative   Married for 92  years   Two daughters, five grand children and 5 great grand children. Daughter lives in [place]. Other daughter lives in [place].    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    Exercise Does not exercise. Was going to exercise class but has since quite.    Diet: Veggies, chicken, salmon. Very little red meat. Eats lots of eggs.    Exercise --- walking , gym 3 classes a week    Past Surgical History  Procedure Laterality Date  . Abdominal hysterectomy    . Breast surgery      Bilateral breast reduction    Family History  Problem Relation Age of Onset  . Cancer Sister     melanoma- with mets    Allergies  Allergen Reactions  . Hydrochlorothiazide   . Hydrocodone-Acetaminophen   . Lisinopril   . Penicillins     Current Outpatient Prescriptions on File Prior to Visit  Medication Sig Dispense Refill  . Ascorbic Acid (VITAMIN C) 1000 MG tablet Take 1,000 mg by mouth daily.    . calcium carbonate (TUMS - DOSED IN MG ELEMENTAL CALCIUM) 500 MG chewable tablet Chew 1  tablet by mouth daily.    . Cholecalciferol (VITAMIN D) 2000 UNITS CAPS Take by mouth.    Marland Kitchen ibuprofen (ADVIL,MOTRIN) 600 MG tablet Take 600 mg by mouth at bedtime as needed.      Marland Kitchen levothyroxine (SYNTHROID, LEVOTHROID) 112 MCG tablet TAKE 1 TABLET DAILY 90 tablet 1  . naproxen (NAPROSYN) 500 MG tablet Take 1 tablet (500 mg total) by mouth 2 (two) times daily with a meal. 30 tablet 0  . ranitidine (ZANTAC) 150 MG capsule Take 150 mg by mouth as needed for heartburn.    . simvastatin (ZOCOR) 40 MG tablet Take 1 tablet (40 mg total) by mouth at bedtime. 90 tablet 1  . valsartan (DIOVAN) 320 MG tablet Take 1 tablet (320 mg total) by mouth daily. 90 tablet 1   No current facility-administered medications on file prior to visit.    BP 138/88 mmHg  Temp(Src) 98.1 F (36.7 C) (Oral)  Ht 5' 2.5" (1.588 m)  Wt 204 lb 9.6 oz (92.806 kg)  BMI 36.80 kg/m2       Objective:     Physical Exam  Constitutional: She is oriented to person, place, and time. She appears well-developed and well-nourished. No distress.  Obese around abdomen.   Cardiovascular: Normal rate, regular rhythm and normal heart sounds.  Exam reveals no gallop and no friction rub.   No murmur heard. Pulmonary/Chest: Effort normal and breath sounds normal. No respiratory distress. She has no wheezes. She has no rales. She exhibits no tenderness.  Musculoskeletal: Normal range of motion. She exhibits no edema or tenderness.  No pain with deep palpation along lateral or medial thigh  Neurological: She is alert and oriented to person, place, and time. She has normal reflexes. She displays normal reflexes. No cranial nerve deficit. She exhibits normal muscle tone. Coordination normal.  Normal sensation to pin prick and cotton ball over anterolateral thigh.     Skin: Skin is warm and dry. No rash noted. She is not diaphoretic. No erythema. No pallor.  Psychiatric: She has a normal mood and affect. Her behavior is normal. Judgment and thought content normal.       Assessment & Plan:  1. Sciatica, right - Need to r/o Spinal Stenosis vs. Sciatica vs Meralgia Paresthetica vs other pinched nerve.  - methylPREDNISolone acetate (DEPO-MEDROL) injection 80 mg; Inject 1 mL (80 mg total) into the muscle once. - Trial of tylenol 650 mg Q8 hours - Alternate hot and cold to area of pain - She states she is going to trial her husbands Gabapentin. I educated her on that I could not recommend her trying her husbands medication and I would be happy to write a prescription for Gabapentin 300mg  TID, she refusd at this time.  - Refused referral to Neurology at this time, will see if medication works - Follow up in 3-4 days if no improvement - Follow up sooner if needed.

## 2014-11-26 ENCOUNTER — Other Ambulatory Visit: Payer: Self-pay

## 2014-11-26 DIAGNOSIS — Z1231 Encounter for screening mammogram for malignant neoplasm of breast: Secondary | ICD-10-CM

## 2014-12-19 ENCOUNTER — Other Ambulatory Visit: Payer: Self-pay | Admitting: Family Medicine

## 2014-12-25 ENCOUNTER — Ambulatory Visit
Admission: RE | Admit: 2014-12-25 | Discharge: 2014-12-25 | Disposition: A | Discharge status: Home / Self Care | Payer: Medicare Other | Source: Ambulatory Visit

## 2014-12-25 DIAGNOSIS — Z1231 Encounter for screening mammogram for malignant neoplasm of breast: Secondary | ICD-10-CM

## 2015-01-16 ENCOUNTER — Telehealth: Payer: Self-pay | Admitting: Adult Health

## 2015-01-16 MED ORDER — SIMVASTATIN 40 MG PO TABS: 40.0000 mg | ORAL_TABLET | Freq: Every day | ORAL | Status: DC

## 2015-01-16 MED ORDER — LEVOTHYROXINE SODIUM 112 MCG PO TABS: 112.0000 ug | ORAL_TABLET | Freq: Every day | ORAL | Status: DC

## 2015-01-16 MED ORDER — VALSARTAN 320 MG PO TABS: 320.0000 mg | ORAL_TABLET | Freq: Every day | ORAL | Status: DC

## 2015-01-16 NOTE — Telephone Encounter (Signed)
Rx called in to [facility] for 15 day supply. Called and spoke with pt and pt states she needs simvastatin, levothyroxine, and valsartan #90 sent to [facility].  All prescriptions requested sent to pharmacy.

## 2015-01-16 NOTE — Telephone Encounter (Signed)
[facility]'s [facility]ill request to the wrong office. But pt states they are sending fax to [place] today for her refills However, pt is out of simvastatin (ZOCOR) 40 MG tablet And needs [facility] day rx [facility] to  [facility] [address]

## 2015-01-24 ENCOUNTER — Telehealth: Payer: Self-pay | Admitting: Adult Health

## 2015-01-24 MED ORDER — SIMVASTATIN 40 MG PO TABS: 40.0000 mg | ORAL_TABLET | Freq: Every day | ORAL | Status: DC

## 2015-01-24 MED ORDER — VALSARTAN 320 MG PO TABS: 320.0000 mg | ORAL_TABLET | Freq: Every day | ORAL | Status: DC

## 2015-01-24 NOTE — Telephone Encounter (Signed)
Both prescriptions sent to the pharmacy for 7 days supply. Called and spoke with pt and pt is aware. Pt states [facility] will send her medicaitons on 8.5.2016 and that a 7 day supply will do until then.

## 2015-01-24 NOTE — Telephone Encounter (Signed)
[facility]. Pt needs 7 day supply of simvastatin  40 mg and valsartan 320 mg send to [address]

## 2015-02-20 DIAGNOSIS — Z85828 Personal history of other malignant neoplasm of skin: Secondary | ICD-10-CM | POA: Diagnosis not present

## 2015-02-20 DIAGNOSIS — L820 Inflamed seborrheic keratosis: Secondary | ICD-10-CM | POA: Diagnosis not present

## 2015-02-20 DIAGNOSIS — L578 Other skin changes due to chronic exposure to nonionizing radiation: Secondary | ICD-10-CM | POA: Diagnosis not present

## 2015-02-20 DIAGNOSIS — L821 Other seborrheic keratosis: Secondary | ICD-10-CM | POA: Diagnosis not present

## 2015-03-18 ENCOUNTER — Encounter: Payer: Self-pay | Admitting: Gastroenterology

## 2015-03-20 ENCOUNTER — Encounter: Payer: Self-pay | Admitting: Gastroenterology

## 2015-04-22 ENCOUNTER — Encounter: Payer: Self-pay | Admitting: Gastroenterology

## 2015-05-19 ENCOUNTER — Ambulatory Visit (INDEPENDENT_AMBULATORY_CARE_PROVIDER_SITE_OTHER): Payer: Medicare Other | Admitting: Adult Health

## 2015-05-19 ENCOUNTER — Encounter: Payer: Self-pay | Admitting: Adult Health

## 2015-05-19 VITALS — BP 130/82 | Temp 98.2°F | Ht 62.5 in | Wt 203.6 lb

## 2015-05-19 DIAGNOSIS — Z23 Encounter for immunization: Secondary | ICD-10-CM | POA: Diagnosis not present

## 2015-05-19 DIAGNOSIS — M5412 Radiculopathy, cervical region: Secondary | ICD-10-CM

## 2015-05-19 MED ORDER — TIZANIDINE HCL 4 MG PO TABS: 4.0000 mg | ORAL_TABLET | Freq: Four times a day (QID) | ORAL | Status: DC | PRN

## 2015-05-19 MED ORDER — METHYLPREDNISOLONE 4 MG PO TBPK: ORAL_TABLET | ORAL | Status: DC

## 2015-05-19 NOTE — Progress Notes (Signed)
Subjective:    Patient ID: Hannah Morrow, female    DOB: 09-28-1940, 74 y.o.   MRN: [id number]  HPI   74 year old female who presents to the office today for right sided shoulder pain x 2 months. Per patient she has more pain in the morning after sleeping on her right side and with certain movements. Pain is mostly " on the inside of my shoulder" but does have a "tingling" sensation on occasion that travels down her right arm.   Denies any loss of feeling in her fingers. No loss of range of motion. No issues with grip strength.    Review of Systems  Constitutional: Negative.   Respiratory: Negative.   Cardiovascular: Negative.   Musculoskeletal: Negative for myalgias, back pain, joint swelling, arthralgias, gait problem, neck pain and neck stiffness.       Pain in right shoulder  Neurological: Negative.        Tingling in right arm  All other systems reviewed and are negative.  Past Medical History  Diagnosis Date  . Hyperlipidemia   . Hypertension   . ANXIETY   . RESTLESS LEGS SYNDROME   . GERD   . Plantar fasciitis   . Hypothyroid     Social History   Social History  . Marital Status: Married    Spouse Name: N/A  . Number of Children: 2  . Years of Education: N/A   Occupational History  . Not on file.   Social History Main Topics  . Smoking status: Never Smoker   . Smokeless tobacco: Never Used  . Alcohol Use: Yes     Comment: Occasional  . Drug Use: No  . Sexual Activity:    Partners: Male   Other Topics Concern  . Not on file   Social History Narrative   Married for 71 years   Two daughters, five grand children and 5 great grand children. Daughter lives in [place]. Other daughter lives in [place].    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    Exercise Does not exercise. Was going to exercise class but has since quite.    Diet: Veggies, chicken, salmon. Very little red meat. Eats lots of eggs.    Exercise --- walking , gym 3 classes a week    Past Surgical History  Procedure Laterality Date  . Abdominal hysterectomy    . Breast surgery      Bilateral breast reduction    Family History  Problem Relation Age of Onset  . Cancer Sister     melanoma- with mets    Allergies  Allergen Reactions  . Hydrochlorothiazide   . Hydrocodone-Acetaminophen   . Lisinopril   . Penicillins     Current Outpatient Prescriptions on File Prior to Visit  Medication Sig Dispense Refill  . Ascorbic Acid (VITAMIN C) 1000 MG tablet Take 1,000 mg by mouth daily.    . calcium carbonate (TUMS - DOSED IN MG ELEMENTAL CALCIUM) 500 MG chewable tablet Chew 1 tablet by mouth daily.    . Cholecalciferol (VITAMIN D) 2000 UNITS CAPS Take by mouth.    Marland Kitchen ibuprofen (ADVIL,MOTRIN) 600 MG tablet Take 600 mg by mouth at bedtime as needed.      Marland Kitchen levothyroxine (SYNTHROID, LEVOTHROID) 112 MCG tablet Take 1 tablet (112 mcg total) by mouth daily. 90 tablet 1  . ranitidine (ZANTAC) 150 MG capsule Take 150 mg by mouth  as needed for heartburn.    . simvastatin (ZOCOR) 40 MG tablet Take 1 tablet (40 mg total) by mouth at bedtime. 7 tablet 0  . valsartan (DIOVAN) 320 MG tablet Take 1 tablet (320 mg total) by mouth daily. 7 tablet 0   No current facility-administered medications on file prior to visit.    BP 130/82 mmHg  Temp(Src) 98.2 F (36.8 C) (Oral)  Ht 5' 2.5" (1.588 m)  Wt 203 lb 9.6 oz (92.352 kg)  BMI 36.62 kg/m2       Objective:   Physical Exam  Constitutional: She is oriented to person, place, and time. She appears well-developed and well-nourished. No distress.  Cardiovascular: Normal rate, regular rhythm, normal heart sounds and intact distal pulses.  Exam reveals no gallop and no friction rub.   No murmur heard. Pulmonary/Chest: Effort normal and breath sounds normal. No respiratory distress. She has no wheezes. She has no rales. She exhibits no tenderness.  Musculoskeletal: Normal range of  motion. She exhibits tenderness (around acrominion process. ). She exhibits no edema.  Full ROM and normal grip strength.   Neurological: She is alert and oriented to person, place, and time.  Skin: Skin is warm and dry. No rash noted. She is not diaphoretic. No erythema. No pallor.  Psychiatric: She has a normal mood and affect. Her behavior is normal. Judgment and thought content normal.  Nursing note and vitals reviewed.     Assessment & Plan:  1. Cervical radiculitis - methylPREDNISolone (MEDROL DOSEPAK) 4 MG TBPK tablet; Take as directed  Dispense: 21 tablet; Refill: 0 - tiZANidine (ZANAFLEX) 4 MG tablet; Take 1 tablet (4 mg total) by mouth every 6 (six) hours as needed for muscle spasms.  Dispense: 30 tablet; Refill: 1  2. Encounter for immunization - Flu shot given

## 2015-05-19 NOTE — Progress Notes (Signed)
Pre visit review using our clinic review tool, if applicable. No additional management support is needed unless otherwise documented below in the visit note. 

## 2015-05-22 ENCOUNTER — Telehealth: Payer: Self-pay | Admitting: Adult Health

## 2015-05-22 NOTE — Telephone Encounter (Signed)
Spoke to patient on the phone and she explained to me that she had this pressure like pain in her chest last night after eating dinner. When she belched the pain become better. This has happened a couple of times in the past. She does eat healthy and takes omeprazole. She believes that it is more bloating and gas related then cardiac. I advised her that although it does sound more like a gas pain that I could not be certain without her being evaluated. She does not want to be evaluated. Was advised to go to the ER if it happens again over the weekend.   Although she does eat healthy she does eat a lot of vegetables that can produce gas. She was advised to try an OTC anti gas medication before eating these foods.

## 2015-05-22 NOTE — Telephone Encounter (Signed)
Cory, please see message and advise. 

## 2015-05-22 NOTE — Telephone Encounter (Signed)
Patient Name: Hannah Morrow DOB: 02/18/1941 Initial Comment Caller States she was having chest pains last night, seems better this morning but wants to speak with someone, this has happened before Nurse Assessment Nurse: Marcelline Deist, RN, Kermit Balo Date/Time (Eastern Time): 05/22/2015 8:48:47 AM Confirm and document reason for call. If symptomatic, describe symptoms. ---Caller states she was having chest pains last night, both sides and squeezed going around to her back. She belched and it got better. Seems better this morning, but wants to speak with someone. This has happened before . Usually lasts about 5 minutes, related to indigestion she thinks. States it lasted longer last night than in the past - maybe 30-40 mins. Laid on right side and it eventually went away. Is currently taking Prednisone Has the patient traveled out of the country within the last 30 days? ---Not Applicable Does the patient have any new or worsening symptoms? ---Yes Will a triage be completed? ---Yes Related visit to physician within the last 2 weeks? ---No Does the PT have any chronic conditions? (i.e. diabetes, asthma, etc.) ---Yes List chronic conditions. ---on BP & cholesterol rxs. , thyroid issues Is this a behavioral health call? ---No Guidelines Guideline Title Affirmed Question Affirmed Notes Chest Pain [1] Patient claims chest pain is same as previously diagnosed "heartburn" AND [2] describes burning in chest AND [3] accompanying sour taste in mouth Final Disposition User Call PCP within [place], RN, [employer] mentioned she is on 3rd day of taking Prednisone for her shoulder which she was seen for the other day. Please provide additional feedback regarding the chest pain she had last night. This is her best contact #. The patient states she feels great this morning, no symptoms. Referrals REFERRED TO PCP OFFICE Disagree/Comply: Comply

## 2015-05-27 ENCOUNTER — Encounter: Payer: Medicare Other | Admitting: Gastroenterology

## 2015-06-02 ENCOUNTER — Ambulatory Visit (AMBULATORY_SURGERY_CENTER): Payer: Self-pay | Admitting: *Deleted

## 2015-06-02 VITALS — Ht 61.75 in | Wt 200.4 lb

## 2015-06-02 DIAGNOSIS — Z800 Family history of malignant neoplasm of digestive organs: Secondary | ICD-10-CM

## 2015-06-02 MED ORDER — SUPREP BOWEL PREP KIT 17.5-3.13-1.6 GM/177ML PO SOLN: 1.0000 | Freq: Once | ORAL | Status: DC

## 2015-06-02 NOTE — Progress Notes (Signed)
Patient denies any allergies to egg or soy products. Patient denies complications with anesthesia/sedation.  Patient denies oxygen use at home and denies diet medications. Emmi instructions for colonoscopy but patient denied.   

## 2015-06-11 ENCOUNTER — Telehealth: Payer: Self-pay | Admitting: Gastroenterology

## 2015-06-11 ENCOUNTER — Telehealth: Payer: Self-pay | Admitting: Adult Health

## 2015-06-11 ENCOUNTER — Encounter: Payer: Medicare Other | Admitting: Gastroenterology

## 2015-06-11 ENCOUNTER — Encounter: Payer: Self-pay | Admitting: *Deleted

## 2015-06-11 NOTE — Telephone Encounter (Signed)
Left detailed message on machine for patient 

## 2015-06-11 NOTE — Telephone Encounter (Signed)
Called pt to inform her I will mail new instructions. She needs to stop her ruoughage foods starting Friday 06-13-2015. Pt states she still has her prep. Never drank one before her fall Instructed to call office if doesn't get new instructions by Monday 12-12  Marie PV

## 2015-06-11 NOTE — Telephone Encounter (Signed)
patient called answering service last night to reschedule today's procedure. I called patient this morning and she states that she tripped and fell last night and pulled her hamstring. she states that she did call 911 for this. she says that she is feeling better this morning but hates that she had to reschedule. Patient rescheduled procedure to 06/11/15. Do you want to charge "late cancellation" fee? Thanks.

## 2015-06-11 NOTE — Telephone Encounter (Signed)
She can take the muscle relaxer

## 2015-06-11 NOTE — Telephone Encounter (Signed)
Ms. Rang called saying she fell yesterday and called EMS. She's better today but the back of her Rt leg is still in pain. She still has muscle relaxer's Tommi Rumps gave her some time ago for shoulder pain and took one last night which helped. She's wondering if it's ok for her to continue taking those for the pain or if she needs to come in. She'd like a phone call regarding this.  Pt's ph# [phone] Thank you.

## 2015-06-12 NOTE — Telephone Encounter (Signed)
No charge. 

## 2015-06-18 ENCOUNTER — Ambulatory Visit (AMBULATORY_SURGERY_CENTER): Payer: Medicare Other | Admitting: Gastroenterology

## 2015-06-18 ENCOUNTER — Encounter: Payer: Self-pay | Admitting: Gastroenterology

## 2015-06-18 VITALS — BP 132/69 | HR 63 | Temp 96.9°F | Resp 17 | Ht 61.75 in | Wt 200.0 lb

## 2015-06-18 DIAGNOSIS — Z800 Family history of malignant neoplasm of digestive organs: Secondary | ICD-10-CM

## 2015-06-18 DIAGNOSIS — I10 Essential (primary) hypertension: Secondary | ICD-10-CM | POA: Diagnosis not present

## 2015-06-18 DIAGNOSIS — K635 Polyp of colon: Secondary | ICD-10-CM | POA: Diagnosis not present

## 2015-06-18 DIAGNOSIS — Z1211 Encounter for screening for malignant neoplasm of colon: Secondary | ICD-10-CM

## 2015-06-18 DIAGNOSIS — D122 Benign neoplasm of ascending colon: Secondary | ICD-10-CM

## 2015-06-18 MED ORDER — SODIUM CHLORIDE 0.9 % IV SOLN: 500.0000 mL | INTRAVENOUS | Status: DC

## 2015-06-18 NOTE — Patient Instructions (Signed)
Colon polyp removed today. Diverticulosis seen. Handouts given.    YOU HAD AN ENDOSCOPIC PROCEDURE TODAY AT [facility]:   Refer to the procedure report that was given to you for any specific questions about what was found during the examination.  If the procedure report does not answer your questions, please call your gastroenterologist to clarify.  If you requested that your care partner not be given the details of your procedure findings, then the procedure report has been included in a sealed envelope for you to review at your convenience later.  YOU SHOULD EXPECT: Some feelings of bloating in the abdomen. Passage of more gas than usual.  Walking can help get rid of the air that was put into your GI tract during the procedure and reduce the bloating. If you had a lower endoscopy (such as a colonoscopy or flexible sigmoidoscopy) you may notice spotting of blood in your stool or on the toilet paper. If you underwent a bowel prep for your procedure, you may not have a normal bowel movement for a few days.  Please Note:  You might notice some irritation and congestion in your nose or some drainage.  This is from the oxygen used during your procedure.  There is no need for concern and it should clear up in a day or so.  SYMPTOMS TO REPORT IMMEDIATELY:   Following lower endoscopy (colonoscopy or flexible sigmoidoscopy):  Excessive amounts of blood in the stool  Significant tenderness or worsening of abdominal pains  Swelling of the abdomen that is new, acute  Fever of 100F or higher   For urgent or emergent issues, a gastroenterologist can be reached at any hour by calling [phone].   DIET: Your first meal following the procedure should be a small meal and then it is ok to progress to your normal diet. Heavy or fried foods are harder to digest and may make you feel nauseous or bloated.  Likewise, meals heavy in dairy and vegetables can increase bloating.  Drink plenty of  fluids but you should avoid alcoholic beverages for 24 hours.  ACTIVITY:  You should plan to take it easy for the rest of today and you should NOT DRIVE or use heavy machinery until tomorrow (because of the sedation medicines used during the test).    FOLLOW UP: Our staff will call the number listed on your records the next business day following your procedure to check on you and address any questions or concerns that you may have regarding the information given to you following your procedure. If we do not reach you, we will leave a message.  However, if you are feeling well and you are not experiencing any problems, there is no need to return our call.  We will assume that you have returned to your regular daily activities without incident.  If any biopsies were taken you will be contacted by phone or by letter within the next 1-3 weeks.  Please call us at [phone] if you have not heard about the biopsies in 3 weeks.    SIGNATURES/CONFIDENTIALITY: You and/or your care partner have signed paperwork which will be entered into your electronic medical record.  These signatures attest to the fact that that the information above on your After Visit Summary has been reviewed and is understood.  Full responsibility of the confidentiality of this discharge information lies with you and/or your care-partner.

## 2015-06-18 NOTE — Progress Notes (Signed)
Called to room to assist during endoscopic procedure.  Patient ID and intended procedure confirmed with present staff. Received instructions for my participation in the procedure from the performing physician.  

## 2015-06-18 NOTE — Progress Notes (Signed)
A/ox3, pleased with MAC, report to RN 

## 2015-06-19 ENCOUNTER — Telehealth: Payer: Self-pay

## 2015-06-19 NOTE — Op Note (Signed)
[facility]  [address] [place], [id number]   COLONOSCOPY PROCEDURE REPORT  PATIENT: Hannah Morrow, Hannah Morrow  MR#: [id number] BIRTHDATE: [birth year] , 74  yrs. old GENDER: female ENDOSCOPIST: Harl Bowie, MD REFERRED [id number] Laurance Flatten, M.D. PROCEDURE DATE:  06/18/2015 PROCEDURE:   Colonoscopy, screening and Colonoscopy with cold biopsy polypectomy First Screening Colonoscopy - Avg.  risk and is 50 yrs.  old or older - No.  Prior Negative Screening - Now for repeat screening. N/A  History of Adenoma - Now for follow-up colonoscopy & has been > or = to 3 yrs.  N/A History of Adenoma - Now for follow-up colonoscopy & has been > or = to 3 yrs.  Polyps removed today? Yes ASA CLASS:   Class II INDICATIONS:Screening for colonic neoplasia and FH Colon or Rectal Adenocarcinoma. MEDICATIONS: Propofol 300 mg IV  DESCRIPTION OF PROCEDURE:   After the risks benefits and alternatives of the procedure were thoroughly explained, informed consent was obtained.  The digital rectal exam revealed no abnormalities of the rectum.   The LB [id number]  endoscope was introduced through the anus and advanced to the cecum, which was identified by both the appendix and ileocecal valve. No adverse events experienced.   The quality of the prep was good.  The instrument was then slowly withdrawn as the colon was fully examined. Estimated blood loss is zero unless otherwise noted in this procedure report.   COLON FINDINGS: There was moderate diverticulosis in sigmoid colon was noted.   9mm polyp in ascending colon removed by cold forceps. Retroflexed views revealed no abnormalities. The time to cecum = 2.8 Withdrawal time = 10.9   The scope was withdrawn and the procedure completed. COMPLICATIONS: There were no immediate complications.  ENDOSCOPIC IMPRESSION: Moderate diverticulosis in sigmoid was noted  RECOMMENDATIONS: Given your age, you will not need another colonoscopy for  colon cancer screening or polyp surveillance.  These types of tests usually stop around the age 105.  eSigned:  Harl Bowie, MD 06/18/2015 9:14 AM

## 2015-06-19 NOTE — Telephone Encounter (Signed)
  Follow up Call-  Call back number 06/18/2015  Post procedure Call Back phone  # [phone] cell  Permission to leave phone message Yes     Patient questions:  Do you have a fever, pain , or abdominal swelling? No. Pain Score  0 *  Have you tolerated food without any problems? Yes.    Have you been able to return to your normal activities? Yes.    Do you have any questions about your discharge instructions: Diet   No. Medications  No. Follow up visit  No.  Do you have questions or concerns about your Care? No.  Actions: * If pain score is 4 or above: No action needed, pain <4.

## 2015-06-24 ENCOUNTER — Encounter: Payer: Self-pay | Admitting: Gastroenterology

## 2015-07-03 ENCOUNTER — Other Ambulatory Visit: Payer: Self-pay | Admitting: Adult Health

## 2015-07-03 ENCOUNTER — Other Ambulatory Visit: Payer: Self-pay

## 2015-07-03 MED ORDER — TIZANIDINE HCL 4 MG PO TABS: 4.0000 mg | ORAL_TABLET | Freq: Four times a day (QID) | ORAL | Status: DC | PRN

## 2015-07-03 NOTE — Telephone Encounter (Signed)
Pt needs new rx tizanidine 4 mg #30 w/refills send to new pharm [facility] on [address] Pt can no longer use [facility] due to insurance

## 2015-07-03 NOTE — Telephone Encounter (Signed)
Patient requested Zanaflex be sent to different pharmacy due to insurance. Rx sent to preferred pharmacy. Thanks!

## 2015-07-03 NOTE — Telephone Encounter (Signed)
Rx sent to preferred pharmacy. Thanks.

## 2015-07-09 ENCOUNTER — Encounter: Payer: Medicare Other | Admitting: Adult Health

## 2015-07-13 ENCOUNTER — Other Ambulatory Visit: Payer: Self-pay | Admitting: Adult Health

## 2015-07-15 ENCOUNTER — Other Ambulatory Visit: Payer: Self-pay | Admitting: Family Medicine

## 2015-07-23 ENCOUNTER — Other Ambulatory Visit: Payer: Self-pay | Admitting: Adult Health

## 2015-07-24 ENCOUNTER — Encounter: Payer: Self-pay | Admitting: Adult Health

## 2015-07-24 ENCOUNTER — Ambulatory Visit (INDEPENDENT_AMBULATORY_CARE_PROVIDER_SITE_OTHER)
Admission: RE | Admit: 2015-07-24 | Discharge: 2015-07-24 | Disposition: A | Discharge status: Home / Self Care | Payer: Medicare Other | Source: Ambulatory Visit | Attending: Adult Health | Admitting: Adult Health

## 2015-07-24 ENCOUNTER — Ambulatory Visit (INDEPENDENT_AMBULATORY_CARE_PROVIDER_SITE_OTHER): Payer: Medicare Other | Admitting: Adult Health

## 2015-07-24 VITALS — BP 124/100 | Temp 97.8°F | Ht 62.0 in | Wt 198.8 lb

## 2015-07-24 DIAGNOSIS — E785 Hyperlipidemia, unspecified: Secondary | ICD-10-CM | POA: Diagnosis not present

## 2015-07-24 DIAGNOSIS — M25511 Pain in right shoulder: Secondary | ICD-10-CM

## 2015-07-24 DIAGNOSIS — I10 Essential (primary) hypertension: Secondary | ICD-10-CM

## 2015-07-24 DIAGNOSIS — M25551 Pain in right hip: Secondary | ICD-10-CM

## 2015-07-24 DIAGNOSIS — E039 Hypothyroidism, unspecified: Secondary | ICD-10-CM

## 2015-07-24 DIAGNOSIS — Z0000 Encounter for general adult medical examination without abnormal findings: Secondary | ICD-10-CM

## 2015-07-24 LAB — CBC WITH DIFFERENTIAL/PLATELET
BASOS PCT: 0.9 % (ref 0.0–3.0)
Basophils Absolute: 0.1 10*3/uL (ref 0.0–0.1)
EOS PCT: 4.5 % (ref 0.0–5.0)
Eosinophils Absolute: 0.3 10*3/uL (ref 0.0–0.7)
HCT: 45.1 % (ref 36.0–46.0)
HEMOGLOBIN: 14.3 g/dL (ref 12.0–15.0)
LYMPHS ABS: 2.6 10*3/uL (ref 0.7–4.0)
Lymphocytes Relative: 38.2 % (ref 12.0–46.0)
MCHC: 31.6 g/dL (ref 30.0–36.0)
MCV: 78.2 fl (ref 78.0–100.0)
MONO ABS: 0.7 10*3/uL (ref 0.1–1.0)
MONOS PCT: 10.3 % (ref 3.0–12.0)
NEUTROS PCT: 46.1 % (ref 43.0–77.0)
Neutro Abs: 3.1 10*3/uL (ref 1.4–7.7)
Platelets: 268 10*3/uL (ref 150.0–400.0)
RBC: 5.77 Mil/uL — AB (ref 3.87–5.11)
RDW: 14.4 % (ref 11.5–15.5)
WBC: 6.8 10*3/uL (ref 4.0–10.5)

## 2015-07-24 LAB — BASIC METABOLIC PANEL
BUN: 28 mg/dL — AB (ref 6–23)
CHLORIDE: 106 meq/L (ref 96–112)
CO2: 26 mEq/L (ref 19–32)
Calcium: 9.5 mg/dL (ref 8.4–10.5)
Creatinine, Ser: 1.16 mg/dL (ref 0.40–1.20)
GFR: 48.63 mL/min — AB (ref >60.00)
GLUCOSE: 117 mg/dL — AB (ref 70–99)
POTASSIUM: 4.4 meq/L (ref 3.5–5.1)
SODIUM: 141 meq/L (ref 135–145)

## 2015-07-24 LAB — POCT URINALYSIS DIPSTICK
BILIRUBIN UA: NEGATIVE
Blood, UA: NEGATIVE
GLUCOSE UA: NEGATIVE
Ketones, UA: NEGATIVE
NITRITE UA: NEGATIVE
Protein, UA: NEGATIVE
Spec Grav, UA: 1.015
UROBILINOGEN UA: 0.2
pH, UA: 5.5

## 2015-07-24 LAB — LDL CHOLESTEROL, DIRECT: Direct LDL: 114 mg/dL

## 2015-07-24 LAB — HEPATIC FUNCTION PANEL
ALBUMIN: 4.4 g/dL (ref 3.5–5.2)
ALT: 45 U/L — AB (ref 0–35)
AST: 33 U/L (ref 0–37)
Alkaline Phosphatase: 58 U/L (ref 39–117)
Bilirubin, Direct: 0.2 mg/dL (ref 0.0–0.3)
Total Bilirubin: 0.8 mg/dL (ref 0.2–1.2)
Total Protein: 7.2 g/dL (ref 6.0–8.3)

## 2015-07-24 LAB — LIPID PANEL
CHOLESTEROL: 191 mg/dL (ref 0–200)
HDL: 45.9 mg/dL (ref >39.00)
NonHDL: 145.48
TRIGLYCERIDES: 250 mg/dL — AB (ref 0.0–149.0)
Total CHOL/HDL Ratio: 4
VLDL: 50 mg/dL — ABNORMAL HIGH (ref 0.0–40.0)

## 2015-07-24 LAB — TSH: TSH: 1.03 u[IU]/mL (ref 0.35–4.50)

## 2015-07-24 MED ORDER — TRAMADOL HCL 50 MG PO TABS: 50.0000 mg | ORAL_TABLET | Freq: Three times a day (TID) | ORAL | Status: DC | PRN

## 2015-07-24 NOTE — Progress Notes (Signed)
Subjective:  Patient presents today for their annual wellness visit.  She is a pleasant caucasian female who  has a past medical history of Hyperlipidemia; Hypertension; ANXIETY; RESTLESS LEGS SYNDROME; GERD; Plantar fasciitis; Hypothyroid; Allergy; and Cancer ([place]).   Preventive Screening-Counseling & Management  Smoking Status: Never Smoker Second Hand Smoking status: No smokers in home  Risk Factors Regular exercise: She tries to walk daily   Diet: She tries to eat healthy Fall Risk: She has fallen once this year, tripped backwards during [place], she endorses  " I tore my hamstring", did not have a formal diagnosis.   Cardiac risk factors:  advanced age (older than 24 for men, 27 for women) Hyperlipidemia  No diabetes. Family History: None  Depression Screen None. PHQ2 0   Activities of Daily Living Independent ADLs and IADLs   Hearing Difficulties: patient declines  Cognitive Testing No reported trouble.   Normal 3 word recall  List the Names of Other Physician/Practitioners you currently use: 1.Dr. Graciella Freer - GYN 2. Dr. Clydene Laming - [facility]  Immunization History  Administered Date(s) Administered  . Influenza, High Dose Seasonal PF 05/19/2015  . Influenza,inj,Quad PF,36+ Mos 05/13/2014  . Pneumococcal Conjugate-13 05/13/2014  . Pneumococcal Polysaccharide-23 04/23/2013  . Td 12/10/2008  . Zoster 10/06/2012   Required Immunizations needed today: UTD  Screening tests- up to date There are no preventive care reminders to display for this patient.  ROS-   Her acute complaints today include that of:  1) She is complaining of right shoulder pain- this has been an ongoing issue and I last saw her in November for this issue and at that time she had the pain for 2 months prior. She continues to complain of an aching pain, that is more apparent in the morning and with certain movements. She feels like the pain is coming from " inside my shoulder."    2) Over Christmas, she fell backwards over a chair. She endorses that she she injured her right hip and " tore my right hamstring." She developed a large bruise on her right leg from the fall. She did not follow up with anyone s/p fall. She has been taking muscle relaxer, which help, but she does not like the way the medication makes her feel. She has also been using Ibuprofen 400-800 mg twice daily for the pain, and feels like this helps as well, but she does not like taking that much Ibuprofen. Since the fall she has had days of intense right sided hip and buttock pain. At times " the pain feels like " bone grinding on bone." She was unable to drive until recently due to pain when sitting in the car. She has been trying to gradually get back into walking. She feels as though the strength in her right leg has been increasing, but she continues to feel like it is still weak. Today in the office she does not experience any pain.    The following were reviewed and entered/updated in epic: Past Medical History  Diagnosis Date  . Hyperlipidemia   . Hypertension   . ANXIETY   . RESTLESS LEGS SYNDROME   . GERD   . Plantar fasciitis     right  . Hypothyroid   . Allergy     no meds  . Cancer [facility])     cervical cancer - hysterectomy   Patient Active Problem List   Diagnosis Date Noted  . Diarrhea 08/13/2011  . Plantar fasciitis 10/20/2010  . BACK  PAIN 08/26/2010  . MYALGIA 01/09/2010  . DIZZINESS 01/02/2010  . UNSPECIFIED VITAMIN D DEFICIENCY 09/04/2009  . GERD 09/04/2009  . CHEST PAIN UNSPECIFIED 12/10/2008  . FATIGUE 08/29/2008  . RESTLESS LEGS SYNDROME 09/28/2007  . INSOMNIA 09/28/2007  . PALPITATIONS 09/28/2007  . HYPOTHYROIDISM 08/10/2007  . HYPERLIPIDEMIA 08/10/2007  . ANXIETY 08/10/2007  . HYPERTENSION 08/10/2007  . ASTHMA 08/10/2007   Past Surgical History  Procedure Laterality Date  . Abdominal hysterectomy    . Breast surgery      Bilateral breast reduction  . Wisdom  tooth extraction    . Colostomy  04-22-2010    [name]  . [facility] bx      cervix    Family History  Problem Relation Age of Onset  . Cancer Sister     melanoma- with mets  . Colon cancer Father 37  . Stomach cancer Maternal Grandfather   . Rectal cancer Neg Hx   . Esophageal cancer Neg Hx     Medications- reviewed and updated Current Outpatient Prescriptions  Medication Sig Dispense Refill  . Ascorbic Acid (VITAMIN C) 1000 MG tablet Take 1,000 mg by mouth daily.    . calcium carbonate (TUMS - DOSED IN MG ELEMENTAL CALCIUM) 500 MG chewable tablet Chew 1 tablet by mouth daily.    . Cholecalciferol (VITAMIN D) 2000 UNITS CAPS Take by mouth.    Marland Kitchen ibuprofen (ADVIL,MOTRIN) 600 MG tablet Take 600 mg by mouth at bedtime as needed.      Marland Kitchen levothyroxine (SYNTHROID, LEVOTHROID) 112 MCG tablet TAKE 1 TABLET DAILY 90 tablet 0  . omeprazole (PRILOSEC) 20 MG capsule TAKE 1 CAPSULE DAILY 90 capsule 2  . ranitidine (ZANTAC) 150 MG capsule Take 150 mg by mouth as needed for heartburn.    . simvastatin (ZOCOR) 40 MG tablet TAKE 1 TABLET AT BEDTIME 90 tablet 1  . tiZANidine (ZANAFLEX) 4 MG tablet Take 1 tablet (4 mg total) by mouth every 6 (six) hours as needed for muscle spasms. 30 tablet 1  . valsartan (DIOVAN) 320 MG tablet TAKE 1 TABLET DAILY 90 tablet 0   No current facility-administered medications for this visit.    Allergies-reviewed and updated Allergies  Allergen Reactions  . Hydrocodone-Acetaminophen Swelling    rash  . Penicillins Other (See Comments)    SOB, red face  . Vicodin [Hydrocodone-Acetaminophen]     Red face, pt doesn't remember other symptoms  . Hydrochlorothiazide Rash  . Lisinopril Rash    Social History   Social History  . Marital Status: Married    Spouse Name: N/A  . Number of Children: 2  . Years of Education: N/A   Social History Main Topics  . Smoking status: Never Smoker   . Smokeless tobacco: Never Used  . Alcohol Use: 3.0 oz/week    5 Glasses of  wine per week     Comment: Occasional  . Drug Use: No  . Sexual Activity:    Partners: Male    Birth Control/ Protection: Surgical, Other-see comments     Comment: hysterectomy   Other Topics Concern  . None   Social History Narrative   Married for 13 years   Two daughters, five grand children and 5 great grand children. Daughter lives in [place]. Other daughter lives in [place].    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    Exercise Does not exercise. Was going to exercise class but has  since quite.    Diet: Veggies, chicken, salmon. Very little red meat. Eats lots of eggs.    Exercise --- walking , gym 3 classes a week    Objective: BP 124/100 mmHg  Temp(Src) 97.8 F (36.6 C) (Oral)  Ht 5\' 2"  (1.575 m)  Wt 198 lb 12.8 oz (90.175 kg)  BMI 36.35 kg/m2 Gen: NAD, resting comfortably HEENT: Mucous membranes are moist. Oropharynx normal. TM's visualized. No cerumen impaction or acute infection.  Neck: no thyromegaly CV: RRR no murmurs rubs or gallops. No carotid bruit and no peripheral edema.  Lungs: CTAB no crackles, wheeze, rhonchi Abdomen: soft/nontender/nondistended/normal bowel sounds. No rebound or guarding.  MSK: Has normal range of motion in all extremities. Able to raise arms above head without pain. Slight pain in right shoulder with bringing arms straight out from side. Slight pain with cup emptying test. Pain with palpation to right scapula area. Is able to bring bilateral legs off table and bend knee to chest without pain. No pain with palpation to right hip.  GU: Refused breast exam Skin: warm, dry, scattered moles throughout body. No concerning features.  Neuro: grossly normal, moves all extremities, PERRLA  Assessment/Plan:  1. Essential hypertension - Basic metabolic panel - CBC with Differential/Platelet - EKG 12-Lead- Sinus Rhythm, rate 75 - Hepatic function panel - Lipid panel  2. Hyperlipidemia - Basic  metabolic panel - CBC with Differential/Platelet - EKG 12-Lead - Hepatic function panel - Lipid panel - POCT urinalysis dipstick  3. Medicare annual wellness visit, subsequent - Follow up in one year or sooner if needed - Work on diet and exercise  4. Hypothyroidism, unspecified hypothyroidism type - TSH  5. Right shoulder pain - Likely arthritis pain, but cannot r/o rotator cuff, bursitis. May also be tendonitis or cervical radiculopathy.  - DG Shoulder Right; Future - Ambulatory referral to Physical Therapy - Consider cortisone injection - Consider sports medicine referral  6. Right hip pain - Appears more MSK then bone.  - DG HIP UNILAT WITH PELVIS 2-3 VIEWS RIGHT; Future - Ambulatory referral to Physical Therapy - Consider referral to sports medication.  - Use heat or ice.    Return precautions advised.   [name], AGNP

## 2015-07-24 NOTE — Progress Notes (Signed)
Pre visit review using our clinic review tool, if applicable. No additional management support is needed unless otherwise documented below in the visit note. 

## 2015-07-24 NOTE — Patient Instructions (Addendum)
It was great seeing you again!  I will follow up with you regarding your labs.   Please get your xrays done at the [facility] office and once I have those results, I will call you .   Use the Tramadol as needed for pain

## 2015-07-31 ENCOUNTER — Ambulatory Visit: Payer: Medicare Other | Admitting: Physical Therapy

## 2015-08-04 ENCOUNTER — Encounter: Payer: Self-pay | Admitting: Physical Therapy

## 2015-08-04 ENCOUNTER — Ambulatory Visit: Payer: Medicare Other | Attending: Adult Health | Admitting: Physical Therapy

## 2015-08-04 DIAGNOSIS — M25551 Pain in right hip: Secondary | ICD-10-CM

## 2015-08-04 DIAGNOSIS — M25511 Pain in right shoulder: Secondary | ICD-10-CM

## 2015-08-04 DIAGNOSIS — R29898 Other symptoms and signs involving the musculoskeletal system: Secondary | ICD-10-CM

## 2015-08-04 NOTE — Therapy (Signed)
[facility] [address] [place], [place], [address] Phone: [phone]   Fax:  [phone]  Physical Therapy Evaluation  Patient Details  Name: Hannah Morrow MRN: [id number] Date of Birth: 24-May-1941 Referring Provider: Dr. [name]  Encounter Date: 08/04/2015      PT End of Session - 08/04/15 1141    Visit Number 1   Number of Visits 10   Date for PT Re-Evaluation 09/29/15   PT Start Time 1100   PT Stop Time 1142   PT Time Calculation (min) 42 min   Activity Tolerance Patient limited by pain   Behavior During Therapy [facility] for tasks assessed/performed      Past Medical History  Diagnosis Date  . Hyperlipidemia   . Hypertension   . ANXIETY   . RESTLESS LEGS SYNDROME   . GERD   . Plantar fasciitis     right  . Hypothyroid   . Allergy     no meds  . Cancer [facility])     cervical cancer - hysterectomy    Past Surgical History  Procedure Laterality Date  . Abdominal hysterectomy    . Breast surgery      Bilateral breast reduction  . Wisdom tooth extraction    . Colostomy  04-22-2010    [name]  . [facility] bx      cervix    There were no vitals filed for this visit.  Visit Diagnosis:  Hip pain, acute, right - Plan: PT plan of care cert/re-cert  Weakness of right hip - Plan: PT plan of care cert/re-cert  Pain in joint of right shoulder - Plan: PT plan of care cert/re-cert  Shoulder weakness - Plan: PT plan of care cert/re-cert      Subjective Assessment - 08/04/15 1109    Subjective Patient reports her right shoulder began to hurt on 05/2015 with sudden onset.  Patient is right handed.  Patient had predisone that helped until she fell on 1/07/06/2015.  Patient moved a stool and tripped over it forgetting she moved it.  Patient hurt her right hip and re-injured her right shoulder.  Patient tore her  right hamstring.     Pertinent History h/o cancer   Limitations Sitting   How long can you sit  comfortably? sit with leaning on right leg causing ache in right leg   How long can you walk comfortably? increases right hip pain   Patient Stated Goals painfree   Currently in Pain? Yes   Pain Score 8    Pain Location Hip   Pain Orientation Right   Pain Descriptors / Indicators Sharp  grating pain   Pain Type Acute pain   Pain Onset More than a month ago   Pain Frequency Intermittent   Aggravating Factors  walking, driving   Pain Relieving Factors stop walking   Effect of Pain on Daily Activities walking her dog   Multiple Pain Sites Yes   Pain Score 8   Pain Location Shoulder   Pain Orientation Right   Pain Type Acute pain   Pain Onset More than a month ago   Pain Frequency Intermittent   Aggravating Factors  sleeping and lay on right side waking her up, putting right arm into coat, movement   Pain Relieving Factors not moving shoulder   Effect of Pain on Daily Activities using right arm            [facility] PT Assessment - 08/04/15 0001  Assessment   Medical Diagnosis M25.511 right shoulder pain; M25.551 right hip pain   Referring Provider Dr. [name]   Onset Date/Surgical Date 05/06/15   Hand Dominance Right   Prior Therapy None   Precautions   Precautions Other (comment)   Precaution Comments cancer precautions   Restrictions   Weight Bearing Restrictions No   Balance Screen   Has the patient fallen in the past 6 months Yes   How many times? 1  tripped over a stool   Has the patient had a decrease in activity level because of a fear of falling?  No   Is the patient reluctant to leave their home because of a fear of falling?  No   Prior Function   Level of Independence Independent   Vocation Retired   Leisure walking dog,    Cognition   Overall Cognitive Status Within Functional Limits for tasks assessed   Observation/Other Assessments   Focus on Therapeutic Outcomes (FOTO)  38% limitation CJ  31% limitation DJ   AROM   Right Shoulder Flexion 145  Degrees   Right Shoulder ABduction 160 Degrees   Right Shoulder Internal Rotation --  reach to T6   Right Hip Extension -5  pain   Strength   Right Shoulder Flexion 3+/5  pain   Right Shoulder ABduction 3+/5  pain   Right Shoulder Internal Rotation 4+/5   Right Shoulder External Rotation 3+/5  pain   Right Hip Flexion 4/5   Right Hip ABduction 3+/5  pain   Right Hip   Right Hip Extension 0   Palpation   Spinal mobility L2-L5 rotated left   SI assessment  right ilium is anteriorly rotated, sacrum rotated right   Palpation comment palpable tenderness located in right gluteus medius, tender in medical hamstring; decreased mobility of anterior right hip   Ambulation/Gait   Ambulation/Gait Yes   Gait Pattern Decreased stance time - right   Gait Comments feels like hip will give way                   [facility] Adult PT Treatment/Exercise - 08/04/15 0001    Manual Therapy   Manual Therapy Muscle Energy Technique;Joint mobilization   Joint Mobilization anterior glide of right hip grade 3; rotational mobilizatioin of L3-L5 grade 3; sacral mobilization to correct left rotation   Muscle Energy Technique correct right anteriorly rotated ilium                PT Education - 08/04/15 1143    Education provided No          PT Short Term Goals - 08/04/15 1152    PT SHORT TERM GOAL #1   Title independent with initial HEP   Time 4   Period Weeks   Status New   PT SHORT TERM GOAL #2   Title lay on right shoulder with pain decreased >/= 25%   Time 4   Period Weeks   Status New   PT SHORT TERM GOAL #3   Title walk with >/= 25% greater pain in right hip   Time 4   Period Weeks   Status New           PT Long Term Goals - 08/04/15 1153    PT LONG TERM GOAL #1   Title independent with HEP and understand how to progress herself   Time 8   Period Weeks   Status New   PT LONG TERM GOAL #2  Title walk and drive with no pain due to pelvis in correct alignment  and improve hip capsule mobility   Time 8   Period Weeks   Status New   PT LONG TERM GOAL #3   Title lay on right shoulder with minimal pain and does not wake up    Time 8   Period Weeks   Status New   PT LONG TERM GOAL #4   Title right shoulder strength 4+/5 so she is able to do her household tasks   Time 8   Period Weeks   Status New   PT LONG TERM GOAL #5   Title drive without right hip pain due to right hip strength >/= 4+/5   Time 8   Period Weeks   Status New               Plan - 08-13-2015 1145    Clinical Impression Statement Patient is a 75 year old female with diagnosis of Right shoulder pain and right hip pain.  Patient reports sudden onset of right shoulder pain 05/06/2015 but was re-injured on 07/06/2015 when she fell on right side. Patient also hurt her right hip .  Patient fell due to tripping on a stool she moved and forgot she put it there.  FOTO score is 38% limitation.  right shoulder pain is worse with movement and laying on it at level  8/10 and right hip pain is 8/10 with walking and driving.  Right shoulder ROM is full .  Right shoulder strength is 3+/5 with exception of internal rotaiton 4+/5.  Patient has pain with muscle testing on right shoulder.  Right hip extension AROM is -5 and PROM is 0 degrees.  Right hip strnegth is 4/5 for flexion, 3-/5 for extension and abductio nis 3+/5 with pain.  Right ilium is anteriorly rotated and sacrum is rotated left.  L3-L5 is rotated left.  Tenderness located in right gluteuis medius and right anterior shoulder muscles.  Patient is of moderate complexity due to 2 areas to treat, pain is evolfing, and aftect use of right arm and leg and history of cancer preventing [place] from ultrasounding.  Patient will benefit form pskilled therapy to reduce pain and increase strength.    Pt will benefit from skilled therapeutic intervention in order to improve on the following deficits Pain;Decreased strength;Decreased mobility;Decreased  activity tolerance;Decreased endurance;Decreased range of motion;Difficulty walking;Increased fascial restricitons;Increased muscle spasms;Abnormal gait   Rehab Potential Excellent   Clinical Impairments Affecting Rehab Potential history of cervical cancer   PT Frequency 2x / week   PT Duration 8 weeks   PT Treatment/Interventions Iontophoresis 4mg /ml Dexamethasone;Moist Heat;Therapeutic exercise;Therapeutic activities;Electrical Stimulation;Gait training;Manual techniques;Patient/family education;Neuromuscular re-education;Passive range of motion;Dry needling   PT Next Visit Plan soft tissue work, right shoulder strength, right hip mobilization, correct pelvis, right shoulder and hip strength, No ultrasound   PT Home Exercise Plan stretches   Recommended Other Services none   Consulted and Agree with Plan of Care Patient          G-Codes - August 13, 2015 1140    Functional Assessment Tool Used FOTO score is 38% limitaiton CJ  goal is 31% limitaion   Functional Limitation Other PT primary   Other PT Primary Current Status [id number]) At least 20 percent but less than 40 percent impaired, limited or restricted   Other PT Primary Goal Status [id number]) At least 20 percent but less than 40 percent impaired, limited or restricted       Problem List  Patient Active Problem List   Diagnosis Date Noted  . Diarrhea 08/13/2011  . Plantar fasciitis 10/20/2010  . BACK PAIN 08/26/2010  . MYALGIA 01/09/2010  . DIZZINESS 01/02/2010  . UNSPECIFIED VITAMIN D DEFICIENCY 09/04/2009  . GERD 09/04/2009  . CHEST PAIN UNSPECIFIED 12/10/2008  . FATIGUE 08/29/2008  . RESTLESS LEGS SYNDROME 09/28/2007  . INSOMNIA 09/28/2007  . PALPITATIONS 09/28/2007  . Hypothyroidism 08/10/2007  . Hyperlipidemia 08/10/2007  . ANXIETY 08/10/2007  . Essential hypertension 08/10/2007  . ASTHMA 08/10/2007    Earlie Counts, PT 08/04/2015 11:58 AM    [facility] [address] [place], [place], [address] Phone: [phone]   Fax:  [phone]  Name: Hannah Morrow MRN: [id number] Date of Birth: 05-03-1941

## 2015-08-05 ENCOUNTER — Encounter: Payer: Self-pay | Admitting: Adult Health

## 2015-08-05 ENCOUNTER — Ambulatory Visit (INDEPENDENT_AMBULATORY_CARE_PROVIDER_SITE_OTHER): Payer: Medicare Other | Admitting: Adult Health

## 2015-08-05 VITALS — BP 138/80 | HR 111 | Temp 98.6°F | Ht 62.0 in | Wt 188.0 lb

## 2015-08-05 DIAGNOSIS — J069 Acute upper respiratory infection, unspecified: Secondary | ICD-10-CM | POA: Diagnosis not present

## 2015-08-05 MED ORDER — METHYLPREDNISOLONE 4 MG PO TBPK: ORAL_TABLET | ORAL | Status: DC

## 2015-08-05 MED ORDER — DOXYCYCLINE HYCLATE 100 MG PO CAPS: 100.0000 mg | ORAL_CAPSULE | Freq: Two times a day (BID) | ORAL | Status: DC

## 2015-08-05 NOTE — Progress Notes (Signed)
Pre visit review using our clinic review tool, if applicable. No additional management support is needed unless otherwise documented below in the visit note. 

## 2015-08-05 NOTE — Progress Notes (Signed)
Subjective:    Patient ID: Hannah Morrow, female    DOB: Sep 07, 1940, 75 y.o.   MRN: [id number]  HPI  75 year old female who presents to the office today for an acute complaint of of non productive cough, chest congestion, sore throat, chills, body aches and post nasal drip. Her symptoms started 7 days ago. Yesterday her symptoms were the worst she has felt.   She denies any fevers, n/v/d or sick contacts.   Review of Systems  Constitutional: Positive for chills, activity change, appetite change and fatigue. Negative for fever.  HENT: Positive for congestion, ear pain, postnasal drip, rhinorrhea and sore throat. Negative for ear discharge and sinus pressure.   Respiratory: Positive for cough and chest tightness. Negative for shortness of breath and wheezing.   Cardiovascular: Negative.   Neurological: Positive for headaches.   Past Medical History  Diagnosis Date  . Hyperlipidemia   . Hypertension   . ANXIETY   . RESTLESS LEGS SYNDROME   . GERD   . Plantar fasciitis     right  . Hypothyroid   . Allergy     no meds  . Cancer [facility])     cervical cancer - hysterectomy    Social History   Social History  . Marital Status: Married    Spouse Name: N/A  . Number of Children: 2  . Years of Education: N/A   Occupational History  . Not on file.   Social History Main Topics  . Smoking status: Never Smoker   . Smokeless tobacco: Never Used  . Alcohol Use: 3.0 oz/week    5 Glasses of wine per week     Comment: Occasional  . Drug Use: No  . Sexual Activity:    Partners: Male    Birth Control/ Protection: Surgical, Other-see comments     Comment: hysterectomy   Other Topics Concern  . Not on file   Social History Narrative   Married for 57 years   Two daughters, five grand children and 5 great grand children. Daughter lives in [place]. Other daughter lives in [place].    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    Exercise Does not exercise. Was going to exercise class but has since quite.    Diet: Veggies, chicken, salmon. Very little red meat. Eats lots of eggs.    Exercise --- walking , gym 3 classes a week    Past Surgical History  Procedure Laterality Date  . Abdominal hysterectomy    . Breast surgery      Bilateral breast reduction  . Wisdom tooth extraction    . Colostomy  04-22-2010    [name]  . [facility] bx      cervix    Family History  Problem Relation Age of Onset  . Cancer Sister     melanoma- with mets  . Colon cancer Father 70  . Stomach cancer Maternal Grandfather   . Rectal cancer Neg Hx   . Esophageal cancer Neg Hx     Allergies  Allergen Reactions  . Hydrocodone-Acetaminophen Swelling    rash  . Penicillins Other (See Comments)    SOB, red face  . Vicodin [Hydrocodone-Acetaminophen]     Red face, pt doesn't remember other symptoms  . Hydrochlorothiazide Rash  . Lisinopril Rash    Current Outpatient Prescriptions on File Prior to Visit  Medication Sig Dispense Refill  . Ascorbic Acid (  VITAMIN C) 1000 MG tablet Take 1,000 mg by mouth daily.    . calcium carbonate (TUMS - DOSED IN MG ELEMENTAL CALCIUM) 500 MG chewable tablet Chew 1 tablet by mouth daily.    . Cholecalciferol (VITAMIN D) 2000 UNITS CAPS Take by mouth.    Marland Kitchen ibuprofen (ADVIL,MOTRIN) 600 MG tablet Take 600 mg by mouth at bedtime as needed.      Marland Kitchen levothyroxine (SYNTHROID, LEVOTHROID) 112 MCG tablet TAKE 1 TABLET DAILY 90 tablet 0  . omeprazole (PRILOSEC) 20 MG capsule TAKE 1 CAPSULE DAILY 90 capsule 2  . ranitidine (ZANTAC) 150 MG capsule Take 150 mg by mouth as needed for heartburn.    . simvastatin (ZOCOR) 40 MG tablet TAKE 1 TABLET AT BEDTIME 90 tablet 1  . tiZANidine (ZANAFLEX) 4 MG tablet Take 1 tablet (4 mg total) by mouth every 6 (six) hours as needed for muscle spasms. 30 tablet 1  . traMADol (ULTRAM) 50 MG tablet Take 1 tablet (50 mg total) by mouth every 8 (eight) hours as needed. 30 tablet 0    . valsartan (DIOVAN) 320 MG tablet TAKE 1 TABLET DAILY 90 tablet 0   No current facility-administered medications on file prior to visit.    BP 138/80 mmHg  Pulse 111  Temp(Src) 98.6 F (37 C) (Oral)  Ht 5\' 2"  (1.575 m)  Wt 188 lb (85.276 kg)  BMI 34.38 kg/m2  SpO2 97%       Objective:   Physical Exam  Constitutional: She is oriented to person, place, and time. She appears well-developed and well-nourished. No distress.  She appears tired and worn out.   HENT:  Head: Normocephalic and atraumatic.  Right Ear: External ear normal.  Left Ear: External ear normal.  Nose: Nose normal.  Mouth/Throat: Oropharynx is clear and moist. No oropharyngeal exudate.  TM's visualized. No signs of effusion or infection  Neck: Normal range of motion. Neck supple.  Cardiovascular: Normal rate, regular rhythm, normal heart sounds and intact distal pulses.  Exam reveals no gallop and no friction rub.   No murmur heard. Pulmonary/Chest: Effort normal and breath sounds normal. No respiratory distress. She has no wheezes. She has no rales. She exhibits no tenderness.  Lymphadenopathy:    She has no cervical adenopathy.  Neurological: She is alert and oriented to person, place, and time.  Skin: Skin is warm and dry. No rash noted. She is not diaphoretic. No erythema. No pallor.  Psychiatric: She has a normal mood and affect. Her behavior is normal. Judgment and thought content normal.  Nursing note and vitals reviewed.     Assessment & Plan:  1. Acute upper respiratory infection - Will treat as a bacterial infection and possible bronchitis. No concern for pneumonia at this time.  - doxycycline (VIBRAMYCIN) 100 MG capsule; Take 1 capsule (100 mg total) by mouth 2 (two) times daily.  Dispense: 20 capsule; Refill: 0 - methylPREDNISolone (MEDROL DOSEPAK) 4 MG TBPK tablet; Take as directed  Dispense: 21 tablet; Refill: 0

## 2015-08-05 NOTE — Patient Instructions (Addendum)
I am sorry you are feeling so badly.   I have sent in a prescription for prednisone and doxycyline. Take these as directed.   You can also use Mucinex to help control the cough.   Follow up with me if you do not notice any improvement in the next 2-3 days.     Upper Respiratory Infection, Adult Most upper respiratory infections (URIs) are a viral infection of the air passages leading to the lungs. A URI affects the nose, throat, and upper air passages. The most common type of URI is nasopharyngitis and is typically referred to as "the common cold." URIs run their course and usually go away on their own. Most of the time, a URI does not require medical attention, but sometimes a bacterial infection in the upper airways can follow a viral infection. This is called a secondary infection. Sinus and middle ear infections are common types of secondary upper respiratory infections. Bacterial pneumonia can also complicate a URI. A URI can worsen asthma and chronic obstructive pulmonary disease (COPD). Sometimes, these complications can require emergency medical care and may be life threatening.  CAUSES Almost all URIs are caused by viruses. A virus is a type of germ and can spread from one person to another.  RISKS FACTORS You may be at risk for a URI if:   You smoke.   You have chronic heart or lung disease.  You have a weakened defense (immune) system.   You are very young or very old.   You have nasal allergies or asthma.  You work in crowded or poorly ventilated areas.  You work in health care facilities or schools. SIGNS AND SYMPTOMS  Symptoms typically develop 2-3 days after you come in contact with a cold virus. Most viral URIs last 7-10 days. However, viral URIs from the influenza virus (flu virus) can last 14-18 days and are typically more severe. Symptoms may include:   Runny or stuffy (congested) nose.   Sneezing.   Cough.   Sore throat.   Headache.   Fatigue.    Fever.   Loss of appetite.   Pain in your forehead, behind your eyes, and over your cheekbones (sinus pain).  Muscle aches.  DIAGNOSIS  Your health care provider may diagnose a URI by:  Physical exam.  Tests to check that your symptoms are not due to another condition such as:  Strep throat.  Sinusitis.  Pneumonia.  Asthma. TREATMENT  A URI goes away on its own with time. It cannot be cured with medicines, but medicines may be prescribed or recommended to relieve symptoms. Medicines may help:  Reduce your fever.  Reduce your cough.  Relieve nasal congestion. HOME CARE INSTRUCTIONS   Take medicines only as directed by your health care provider.   Gargle warm saltwater or take cough drops to comfort your throat as directed by your health care provider.  Use a warm mist humidifier or inhale steam from a shower to increase air moisture. This may make it easier to breathe.  Drink enough fluid to keep your urine clear or pale yellow.   Eat soups and other clear broths and maintain good nutrition.   Rest as needed.   Return to work when your temperature has returned to normal or as your health care provider advises. You may need to stay home longer to avoid infecting others. You can also use a face mask and careful hand washing to prevent spread of the virus.  Increase the usage of your  inhaler if you have asthma.   Do not use any tobacco products, including cigarettes, chewing tobacco, or electronic cigarettes. If you need help quitting, ask your health care provider. PREVENTION  The best way to protect yourself from getting a cold is to practice good hygiene.   Avoid oral or hand contact with people with cold symptoms.   Wash your hands often if contact occurs.  There is no clear evidence that vitamin C, vitamin E, echinacea, or exercise reduces the chance of developing a cold. However, it is always recommended to get plenty of rest, exercise, and  practice good nutrition.  SEEK MEDICAL CARE IF:   You are getting worse rather than better.   Your symptoms are not controlled by medicine.   You have chills.  You have worsening shortness of breath.  You have brown or red mucus.  You have yellow or brown nasal discharge.  You have pain in your face, especially when you bend forward.  You have a fever.  You have swollen neck glands.  You have pain while swallowing.  You have white areas in the back of your throat. SEEK IMMEDIATE MEDICAL CARE IF:   You have severe or persistent:  Headache.  Ear pain.  Sinus pain.  Chest pain.  You have chronic lung disease and any of the following:  Wheezing.  Prolonged cough.  Coughing up blood.  A change in your usual mucus.  You have a stiff neck.  You have changes in your:  Vision.  Hearing.  Thinking.  Mood. MAKE SURE YOU:   Understand these instructions.  Will watch your condition.  Will get help right away if you are not doing well or get worse.   This information is not intended to replace advice given to you by your health care provider. Make sure you discuss any questions you have with your health care provider.   Document Released: 12/15/2000 Document Revised: 11/05/2014 Document Reviewed: 09/26/2013 Elsevier Interactive Patient Education [employer].

## 2015-08-06 ENCOUNTER — Encounter: Payer: Self-pay | Admitting: Physical Therapy

## 2015-08-06 ENCOUNTER — Ambulatory Visit: Payer: Medicare Other | Attending: Adult Health | Admitting: Physical Therapy

## 2015-08-06 DIAGNOSIS — M25551 Pain in right hip: Secondary | ICD-10-CM | POA: Diagnosis not present

## 2015-08-06 DIAGNOSIS — R29898 Other symptoms and signs involving the musculoskeletal system: Secondary | ICD-10-CM | POA: Diagnosis not present

## 2015-08-06 DIAGNOSIS — M25511 Pain in right shoulder: Secondary | ICD-10-CM | POA: Diagnosis not present

## 2015-08-06 NOTE — Therapy (Signed)
PT LONG TERM GOAL #3   Title lay on right shoulder with minimal pain and does not wake up    Time 8   Period Weeks   Status New   PT LONG TERM GOAL #4   Title right shoulder strength 4+/5 so she is able to do her household tasks   Time 8   Period Weeks   Status New   PT LONG TERM GOAL #5   Title drive without right hip pain due to right hip strength >/= 4+/5   Time 8   Period Weeks   Status New               Plan - 08/06/15 1304    Clinical Impression Statement Ilium is in alignment today and no complaints of pain.  Feels the prenidsone she just took helped a  her pain. inititaed stretching routine for home today.   Pt will benefit from skilled therapeutic intervention in order to improve on the following deficits Pain;Decreased strength;Decreased mobility;Decreased activity tolerance;Decreased endurance;Decreased range of motion;Difficulty walking;Increased fascial restricitons;Increased muscle spasms;Abnormal gait   Rehab Potential Excellent   Clinical Impairments Affecting Rehab Potential history of cervical cancer   PT Frequency 2x / week   PT Duration 8 weeks   PT Treatment/Interventions  Iontophoresis 4mg /ml Dexamethasone;Moist Heat;Therapeutic exercise;Therapeutic activities;Electrical Stimulation;Gait training;Manual techniques;Patient/family education;Neuromuscular re-education;Passive range of motion;Dry needling   PT Next Visit Plan Review stretches, if she is still doing ok consider starting stabilization. If not address pain.    Consulted and Agree with Plan of Care Patient        Problem List Patient Active Problem List   Diagnosis Date Noted  . Diarrhea 08/13/2011  . Plantar fasciitis 10/20/2010  . BACK PAIN 08/26/2010  . MYALGIA 01/09/2010  . DIZZINESS 01/02/2010  . UNSPECIFIED VITAMIN D DEFICIENCY 09/04/2009  . GERD 09/04/2009  . CHEST PAIN UNSPECIFIED 12/10/2008  . FATIGUE 08/29/2008  . RESTLESS LEGS SYNDROME 09/28/2007  . INSOMNIA 09/28/2007  . PALPITATIONS 09/28/2007  . Hypothyroidism 08/10/2007  . Hyperlipidemia 08/10/2007  . ANXIETY 08/10/2007  . Essential hypertension 08/10/2007  . ASTHMA 08/10/2007    Hannah Morrow, PTA 08/06/2015, 1:15 PM  [facility] [address], [place], [place], [address] Phone: [phone]   Fax:  [phone]  Name: Hannah Morrow MRN: [id number] Date of Birth: 1940/11/21  [facility] [address], [place], [place], [address] Phone: [phone]   Fax:  [phone]  Physical Therapy Treatment  Patient Details  Name: Hannah Morrow MRN: [id number] Date of Birth: [birth year] Referring Provider: Dr. [name]  Encounter Date: 08/06/2015      PT End of Session - 08/06/15 1246    Visit Number 2   Number of Visits 10   Date for PT Re-Evaluation 09/29/15   PT Start Time 1230   PT Stop Time 1315   PT Time Calculation (min) 45 min   Activity Tolerance Patient tolerated treatment well   Behavior During Therapy [facility] for tasks assessed/performed      Past Medical History  Diagnosis Date  . Hyperlipidemia   . Hypertension   . ANXIETY   . RESTLESS LEGS SYNDROME   . GERD   . Plantar fasciitis     right  . Hypothyroid   . Allergy     no meds  . Cancer [facility])     cervical cancer - hysterectomy    Past Surgical History  Procedure Laterality Date  . Abdominal hysterectomy    . Breast surgery      Bilateral breast reduction  . Wisdom tooth extraction    . Colostomy  04-22-2010    [name]  . [facility] bx      cervix    There were no vitals filed for this visit.  Visit Diagnosis:  Hip pain, acute, right  Weakness of right hip  Pain in joint of right shoulder  Shoulder weakness      Subjective Assessment - 08/06/15 1233    Currently in Pain? No/denies   Multiple Pain Sites No                         [facility] Adult PT Treatment/Exercise - 08/06/15 0001    Shoulder Exercises: Pulleys   Flexion 3 minutes                PT Education - 08/06/15 1239    Education provided Yes   Education Details Initial HEP: Single knee to chest, lower trunk rotation, piriformis stretch, outter hip stretch, post pelvic tilt, seated hamstring stretch.   Person(s) Educated Patient   Methods Explanation;Demonstration;Tactile cues;Verbal cues;Handout   Comprehension  Verbalized understanding;Returned demonstration          PT Short Term Goals - 08/04/15 1152    PT SHORT TERM GOAL #1   Title independent with initial HEP   Time 4   Period Weeks   Status New   PT SHORT TERM GOAL #2   Title lay on right shoulder with pain decreased >/= 25%   Time 4   Period Weeks   Status New   PT SHORT TERM GOAL #3   Title walk with >/= 25% greater pain in right hip   Time 4   Period Weeks   Status New           PT Long Term Goals - 08/04/15 1153    PT LONG TERM GOAL #1   Title independent with HEP and understand how to progress herself   Time 8   Period Weeks   Status New   PT LONG TERM GOAL #2   Title walk and drive with no pain due to pelvis in correct alignment and improve hip capsule mobility   Time 8   Period Weeks   Status New

## 2015-08-06 NOTE — Patient Instructions (Signed)
Bridge   Lie back, legs bent. Inhale, pressing hips up. Keeping ribs in, lengthen lower back. Exhale, rolling down along spine from top. Repeat ____ times. Do ____ sessions per day.  Copyright  VHI. All rights reserved.   Pelvic Tilt   Flatten back by tightening stomach muscles and buttocks. Repeat __10__ times per set. Do __1__ sets per session. Do __2__ sessions per day. MAke this movement smooth, not jerky. Arms can be by your side.   http://orth.exer.us/134   Copyright  VHI. All rights reserved. Knee to Chest (Flexion)   Pull knee toward chest. Feel stretch in lower back or buttock area. Breathing deeply, Hold __20__ seconds. Repeat with other knee. Repeat __3__ times. Do __1-2__ sessions per day.  http://gt2.exer.us/225   Copyright  VHI. All rights reserved.   Lower Trunk Rotation Stretch   Arms out to the side. Keeping back flat and feet together, rotate kneesside to side for a bit. Then hold one  side. Hold _20___ seconds. Repeat _3___ times per set.  Do __1__ sessions per day.  http://orth.exer.us/122       http://gt2.exer.us/247   Copyright  VHI. All rights reserved.  Straight Leg Calf Stretch (Gastroc)   Put palms against wall, one leg forward and bent. With other leg back straight and heel flat on floor, lean into wall. Hold ____ seconds. Change legs and repeat. Repeat ____ times. Do ____ sessions per day.    http://gt2.exer.us/419   Copyright  VHI. All rights reserved.    Copyright  VHI. All rights reserved.     Hamstring Stretch, Seated (Strap, Two Chairs)    Sit with one leg extended onto facing chair. Loop strap over outstretched foot at ball of big toe. Lengthen spine. Hold for ____ breaths. Repeat ____ times each leg.  Copyright  VHI. All rights reserved.

## 2015-08-12 ENCOUNTER — Encounter: Payer: Self-pay | Admitting: Physical Therapy

## 2015-08-12 ENCOUNTER — Ambulatory Visit: Payer: Medicare Other | Admitting: Physical Therapy

## 2015-08-12 DIAGNOSIS — R29898 Other symptoms and signs involving the musculoskeletal system: Secondary | ICD-10-CM | POA: Diagnosis not present

## 2015-08-12 DIAGNOSIS — M25511 Pain in right shoulder: Secondary | ICD-10-CM | POA: Diagnosis not present

## 2015-08-12 DIAGNOSIS — M25551 Pain in right hip: Secondary | ICD-10-CM

## 2015-08-12 NOTE — Therapy (Signed)
[facility] [address], [address] [place], [place], [address] Phone: [phone]   Fax:  [phone]  Physical Therapy Treatment  Patient Details  Name: Hannah Morrow MRN: [id number] Date of Birth: 06/24/1941 Referring Provider: Dr. Shirline Frees  Encounter Date: 08/12/2015      PT End of Session - 08/12/15 1223    Visit Number 3   Number of Visits 10   Date for PT Re-Evaluation 09/29/15   PT Start Time 1145   PT Stop Time 1230   PT Time Calculation (min) 45 min   Activity Tolerance Patient tolerated treatment well   Behavior During Therapy [facility] for tasks assessed/performed      Past Medical History  Diagnosis Date  . Hyperlipidemia   . Hypertension   . ANXIETY   . RESTLESS LEGS SYNDROME   . GERD   . Plantar fasciitis     right  . Hypothyroid   . Allergy     no meds  . Cancer [facility])     cervical cancer - hysterectomy    Past Surgical History  Procedure Laterality Date  . Abdominal hysterectomy    . Breast surgery      Bilateral breast reduction  . Wisdom tooth extraction    . Colostomy  04-22-2010    Arlyce Dice  . [facility] bx      cervix    There were no vitals filed for this visit.  Visit Diagnosis:  Hip pain, acute, right  Weakness of right hip  Pain in joint of right shoulder  Shoulder weakness      Subjective Assessment - 08/12/15 1207    Subjective No complain of pain in Rt shoulder, but right hip hurts with sitting esspecially in the car.    Pertinent History h/o cancer   Limitations Sitting   How long can you sit comfortably? sit with leaning on right leg causing ache in right leg   How long can you walk comfortably? increases right hip pain   Patient Stated Goals painfree   Currently in Pain? Yes   Pain Score 4   in the car, on the couch no pain   Pain Location Hip   Pain Orientation Right   Pain Descriptors / Indicators Sharp   Pain Type Acute pain   Pain Onset More than a month  ago   Pain Frequency Intermittent   Aggravating Factors  walking, driving   Pain Relieving Factors stop walking, not sitting in car   Effect of Pain on Daily Activities walking her dog   Multiple Pain Sites No   Pain Score 0                         [facility] Adult PT Treatment/Exercise - 08/12/15 0001    Lumbar Exercises: Stretches   Active Hamstring Stretch 3 reps;20 seconds  neural stretch with Assist by PTA 10x DF x 3 with Rt LE   Single Knee to Chest Stretch 3 reps;20 seconds   Pelvic Tilt --  x 10 with 3 sec hold   Piriformis Stretch 3 reps;20 seconds  each leg   Shoulder Exercises: Pulleys   Flexion 3 minutes   ABduction 3 minutes   Manual Therapy   Manual Therapy Muscle Energy Technique;Soft tissue mobilization;Passive ROM  contract/relaxe for quadriceps in prone    Soft tissue mobilization Rt gluteas/piriformis area   Passive ROM ER/IR in prone, diagonal flexion Rt lLE  PT Short Term Goals - 08/12/15 1229    PT SHORT TERM GOAL #1   Title independent with initial HEP   Time 4   Period Weeks   Status On-going   PT SHORT TERM GOAL #2   Title lay on right shoulder with pain decreased >/= 25%   Time 4   Period Weeks   Status On-going   PT SHORT TERM GOAL #3   Title walk with >/= 25% greater pain in right hip   Time 4   Period Weeks   Status On-going           PT Long Term Goals - 08/04/15 1153    PT LONG TERM GOAL #1   Title independent with HEP and understand how to progress herself   Time 8   Period Weeks   Status New   PT LONG TERM GOAL #2   Title walk and drive with no pain due to pelvis in correct alignment and improve hip capsule mobility   Time 8   Period Weeks   Status New   PT LONG TERM GOAL #3   Title lay on right shoulder with minimal pain and does not wake up    Time 8   Period Weeks   Status New   PT LONG TERM GOAL #4   Title right shoulder strength 4+/5 so she is able to do her household tasks    Time 8   Period Weeks   Status New   PT LONG TERM GOAL #5   Title drive without right hip pain due to right hip strength >/= 4+/5   Time 8   Period Weeks   Status New               Plan - 08/12/15 1224    Clinical Impression Statement Pt able to perform stretching, worked on neural stretch on right LE today. Patient with TP and palpaple tenderness on Rt lat sacral border and gluteus area. Pt will continue to benefit from skilled PT to decrease pain and increase sitting tolerance    Pt will benefit from skilled therapeutic intervention in order to improve on the following deficits Pain;Decreased strength;Decreased mobility;Decreased activity tolerance;Decreased endurance;Decreased range of motion;Difficulty walking;Increased fascial restricitons;Increased muscle spasms;Abnormal gait   Rehab Potential Excellent   Clinical Impairments Affecting Rehab Potential history of cervical cancer   PT Frequency 2x / week   PT Duration 8 weeks   PT Treatment/Interventions Iontophoresis 4mg /ml Dexamethasone;Moist Heat;Therapeutic exercise;Therapeutic activities;Electrical Stimulation;Gait training;Manual techniques;Patient/family education;Neuromuscular re-education;Passive range of motion;Dry needling   PT Next Visit Plan Review stretches, and neural stretches   PT Home Exercise Plan stretches   Consulted and Agree with Plan of Care Patient        Problem List Patient Active Problem List   Diagnosis Date Noted  . Diarrhea 08/13/2011  . Plantar fasciitis 10/20/2010  . BACK PAIN 08/26/2010  . MYALGIA 01/09/2010  . DIZZINESS 01/02/2010  . UNSPECIFIED VITAMIN D DEFICIENCY 09/04/2009  . GERD 09/04/2009  . CHEST PAIN UNSPECIFIED 12/10/2008  . FATIGUE 08/29/2008  . RESTLESS LEGS SYNDROME 09/28/2007  . INSOMNIA 09/28/2007  . PALPITATIONS 09/28/2007  . Hypothyroidism 08/10/2007  . Hyperlipidemia 08/10/2007  . ANXIETY 08/10/2007  . Essential hypertension 08/10/2007  . ASTHMA  08/10/2007    [name],Tersea Aulds PTA 08/12/2015, 2:19 PM  [facility] [address] [place], [place], [address] Phone: [phone]   Fax:  [phone]  Name: Hannah Morrow MRN: [id number] Date  of Birth: [birth year]

## 2015-08-14 ENCOUNTER — Ambulatory Visit: Payer: Medicare Other | Admitting: Physical Therapy

## 2015-08-14 ENCOUNTER — Encounter: Payer: Self-pay | Admitting: Physical Therapy

## 2015-08-14 DIAGNOSIS — R29898 Other symptoms and signs involving the musculoskeletal system: Secondary | ICD-10-CM | POA: Diagnosis not present

## 2015-08-14 DIAGNOSIS — M25551 Pain in right hip: Secondary | ICD-10-CM | POA: Diagnosis not present

## 2015-08-14 DIAGNOSIS — M25511 Pain in right shoulder: Secondary | ICD-10-CM

## 2015-08-14 NOTE — Therapy (Signed)
PT SHORT TERM GOAL #2   Title lay on right shoulder with pain decreased >/= 25%   Time 4   Period Weeks   Status On-going   PT SHORT TERM GOAL #3   Title walk with >/= 25% greater pain in right hip   Time 4   Period Weeks   Status On-going           PT Long Term Goals - 08/04/15 1153    PT LONG TERM GOAL #1   Title independent with HEP and understand how to progress herself   Time 8   Period Weeks   Status New   PT LONG TERM GOAL #2   Title walk and drive with no pain due to pelvis in correct alignment and improve hip capsule mobility   Time 8   Period Weeks   Status New   PT LONG TERM GOAL #3   Title lay on right shoulder with minimal pain and does not wake up    Time 8   Period Weeks   Status New   PT LONG TERM GOAL #4   Title right shoulder strength 4+/5 so she is able to do her household tasks   Time 8   Period Weeks   Status New   PT LONG TERM GOAL #5   Title drive without right hip pain due to right hip strength >/= 4+/5   Time 8   Period Weeks   Status New                Plan - 08/14/15 1154    Clinical Impression Statement Pt is imporving toward goals, she said before it was hurting all the time but no only with certain movements. Pt continues with limitation of rt hip ROm and weakness, TP and tenderness palpated on rt lat sacral border and gluteus. Pt will continue to benefit from skilled PT   Pt will benefit from skilled therapeutic intervention in order to improve on the following deficits Pain;Decreased strength;Decreased mobility;Decreased activity tolerance;Decreased endurance;Decreased range of motion;Difficulty walking;Increased fascial restricitons;Increased muscle spasms;Abnormal gait   Rehab Potential Excellent   Clinical Impairments Affecting Rehab Potential history of cervical cancer   PT Frequency 2x / week   PT Duration 8 weeks   PT Next Visit Plan Review stretches, and neural stretches   PT Home Exercise Plan stretches   Consulted and Agree with Plan of Care Patient        Problem List Patient Active Problem List   Diagnosis Date Noted  . Diarrhea 08/13/2011  . Plantar fasciitis 10/20/2010  . BACK PAIN 08/26/2010  . MYALGIA 01/09/2010  . DIZZINESS 01/02/2010  . UNSPECIFIED VITAMIN D DEFICIENCY 09/04/2009  . GERD 09/04/2009  . CHEST PAIN UNSPECIFIED 12/10/2008  . FATIGUE 08/29/2008  . RESTLESS LEGS SYNDROME 09/28/2007  . INSOMNIA 09/28/2007  . PALPITATIONS 09/28/2007  . Hypothyroidism 08/10/2007  . Hyperlipidemia 08/10/2007  . ANXIETY 08/10/2007  . Essential hypertension 08/10/2007  . ASTHMA 08/10/2007    [name],Hannah Hevia PTA 08/14/2015, 12:21 PM  [facility] [address] [place], [place], [address] Phone: [phone]   Fax:  [phone]  Name: Hannah Morrow MRN: [id number] Date of Birth: May 09, 1941  [facility] [address], [place], [place], [address] Phone: [phone]   Fax:  [phone]  Physical Therapy Treatment  Patient Details  Name: Hannah Morrow MRN: [id number] Date of Birth: [birth year] Referring Provider: Dr. [name]  Encounter Date: 08/14/2015      PT End of Session - 08/14/15 1154    Visit Number 4   Number of Visits 10   Date for PT Re-Evaluation 09/29/15   PT Start Time [id number]   PT Stop Time 1230   PT Time Calculation (min) 46 min   Activity Tolerance Patient tolerated treatment well   Behavior During Therapy [facility] for tasks assessed/performed      Past Medical History  Diagnosis Date  . Hyperlipidemia   . Hypertension   . ANXIETY   . RESTLESS LEGS SYNDROME   . GERD   . Plantar fasciitis     right  . Hypothyroid   . Allergy     no meds  . Cancer [name])     cervical cancer - hysterectomy    Past Surgical History  Procedure Laterality Date  . Abdominal hysterectomy    . Breast surgery      Bilateral breast reduction  . Wisdom tooth extraction    . Colostomy  04-22-2010    [name]  . [facility] bx      cervix    There were no vitals filed for this visit.  Visit Diagnosis:  Hip pain, acute, right  Weakness of right hip  Pain in joint of right shoulder  Shoulder weakness      Subjective Assessment - 08/14/15 1149    Subjective No complain of pain in Rt shoulder only with certain movements, Right hip is better, but leg feels weak and sometimes seems not to support me   Pertinent History h/o cancer   Limitations Sitting   How long can you sit comfortably? sit with leaning on right leg causing ache in right leg   How long can you walk comfortably? increases right hip pain   Patient Stated Goals painfree   Currently in Pain? No/denies                         [facility] Adult PT Treatment/Exercise - 08/14/15 0001    Lumbar Exercises: Stretches   Active  Hamstring Stretch 3 reps;20 seconds  with A by PTA for neural stretch 3reps x10DF with Rt LE   Active Hamstring Stretch Limitations --  HS stretch in sitting 3x 20 sec each leg   Single Knee to Chest Stretch 3 reps;20 seconds   Pelvic Tilt --  x 10 with 2 sec hold   Piriformis Stretch 3 reps;20 seconds  each leg in supine   Lumbar Exercises: Aerobic   Stationary Bike Seat#6, arms #7 Nu-step L1 x 55min   Shoulder Exercises: Pulleys   Flexion 3 minutes   ABduction 3 minutes   Manual Therapy   Manual Therapy Muscle Energy Technique;Soft tissue mobilization;Passive ROM  for quadriceps in prone Rt LE only   Soft tissue mobilization Rt gluteas/piriformis area   Passive ROM ER/IR in prone, diagonal flexion Rt lLE                  PT Short Term Goals - 08/12/15 1229    PT SHORT TERM GOAL #1   Title independent with initial HEP   Time 4   Period Weeks   Status On-going

## 2015-08-19 ENCOUNTER — Encounter: Payer: Self-pay | Admitting: Physical Therapy

## 2015-08-19 ENCOUNTER — Ambulatory Visit: Payer: Medicare Other | Admitting: Physical Therapy

## 2015-08-19 DIAGNOSIS — R29898 Other symptoms and signs involving the musculoskeletal system: Secondary | ICD-10-CM | POA: Diagnosis not present

## 2015-08-19 DIAGNOSIS — M25511 Pain in right shoulder: Secondary | ICD-10-CM | POA: Diagnosis not present

## 2015-08-19 DIAGNOSIS — M25551 Pain in right hip: Secondary | ICD-10-CM | POA: Diagnosis not present

## 2015-08-19 NOTE — Therapy (Signed)
[facility] [address] [place], [place], [address] Phone: [phone]   Fax:  [phone]  Physical Therapy Treatment  Patient Details  Name: Hannah Morrow MRN: [id number] Date of Birth: January 22, 1941 Referring Provider: Dr. Shirline Frees  Encounter Date: 08/19/2015      PT End of Session - 08/19/15 1200    Visit Number 5   Number of Visits 10   Date for PT Re-Evaluation 09/29/15   PT Start Time 1145   PT Stop Time 1230   PT Time Calculation (min) 45 min   Activity Tolerance Patient tolerated treatment well   Behavior During Therapy [facility] for tasks assessed/performed      Past Medical History  Diagnosis Date  . Hyperlipidemia   . Hypertension   . ANXIETY   . RESTLESS LEGS SYNDROME   . GERD   . Plantar fasciitis     right  . Hypothyroid   . Allergy     no meds  . Cancer [facility])     cervical cancer - hysterectomy    Past Surgical History  Procedure Laterality Date  . Abdominal hysterectomy    . Breast surgery      Bilateral breast reduction  . Wisdom tooth extraction    . Colostomy  04-22-2010    Arlyce Dice  . [facility] bx      cervix    There were no vitals filed for this visit.  Visit Diagnosis:  Hip pain, acute, right  Weakness of right hip  Pain in joint of right shoulder  Shoulder weakness      Subjective Assessment - 08/19/15 1155    Subjective Rt shoulder woke me up last night after I cahnged position I felt better. Right hip no complain of pain, but Rt LE feels shaky and I realy have to think about the placement with stepping   Pertinent History h/o cancer   Limitations Sitting   How long can you sit comfortably? sit with leaning on right leg causing ache in right leg   How long can you walk comfortably? increases right hip pain   Patient Stated Goals painfree   Currently in Pain? No/denies   Multiple Pain Sites No                         [facility] Adult PT  Treatment/Exercise - 08/19/15 0001    Exercises   Exercises Knee/Hip   Lumbar Exercises: Stretches   Active Hamstring Stretch 3 reps;20 seconds  each leg on stairs   Active Hamstring Stretch Limitations --  neural stretch Rt LE 10 DF Rt leg assisted by PTA    Single Knee to Chest Stretch 3 reps;20 seconds   Pelvic Tilt --  x 10 with 2 sec hold   Piriformis Stretch 3 reps;20 seconds   Lumbar Exercises: Aerobic   Stationary Bike Bike L2 x   Knee/Hip Exercises: Standing   Rebounder 3 directions x each   Shoulder Exercises: Pulleys   Flexion 3 minutes   ABduction 3 minutes   Manual Therapy   Manual Therapy Muscle Energy Technique;Soft tissue mobilization;Passive ROM  for quadriceps in prone   Soft tissue mobilization Rt gluteas/piriformis area   Passive ROM ER/IR in prone, diagonal flexion Rt lLE                  PT Short Term Goals - 08/19/15 1210    PT SHORT TERM GOAL #1  Title independent with initial HEP   Time 4   Period Weeks   Status On-going   PT SHORT TERM GOAL #2   Title lay on right shoulder with pain decreased >/= 25%   Time 4   Period Weeks   Status On-going   PT SHORT TERM GOAL #3   Title walk with >/= 25% greater pain in right hip   Time 4   Period Weeks   Status On-going           PT Long Term Goals - 08/19/15 1210    PT LONG TERM GOAL #1   Title independent with HEP and understand how to progress herself   Time 8   Period Weeks   Status On-going   PT LONG TERM GOAL #2   Title walk and drive with no pain due to pelvis in correct alignment and improve hip capsule mobility   Time 8   Period Weeks   Status On-going   PT LONG TERM GOAL #3   Title lay on right shoulder with minimal pain and does not wake up    Time 8   Period Weeks   Status Achieved   PT LONG TERM GOAL #4   Title right shoulder strength 4+/5 so she is able to do her household tasks   Time 8   Period Weeks   Status On-going   PT LONG TERM GOAL #5    Title drive without right hip pain due to right hip strength >/= 4+/5   Time 8   Period Weeks   Status On-going               Plan - 08/19/15 1207    Clinical Impression Statement Pt is improving toward goals, pain is not more an issue patient reports  75 to 80 % improvement. Pt continues with Rt hip decreased ROM and weakness. TP and tenderness palpated on Rt lat sacral border and gluteus area. pt will continue to benefit from skilled PT   Pt will benefit from skilled therapeutic intervention in order to improve on the following deficits Pain;Decreased strength;Decreased mobility;Decreased activity tolerance;Decreased endurance;Decreased range of motion;Difficulty walking;Increased fascial restricitons;Increased muscle spasms;Abnormal gait   Rehab Potential Excellent   Clinical Impairments Affecting Rehab Potential history of cervical cancer   PT Frequency 2x / week   PT Duration 8 weeks   PT Next Visit Plan Review stretches, work on LE strength   PT Home Exercise Plan stretches   Consulted and Agree with Plan of Care Patient        Problem List Patient Active Problem List   Diagnosis Date Noted  . Diarrhea 08/13/2011  . Plantar fasciitis 10/20/2010  . BACK PAIN 08/26/2010  . MYALGIA 01/09/2010  . DIZZINESS 01/02/2010  . UNSPECIFIED VITAMIN D DEFICIENCY 09/04/2009  . GERD 09/04/2009  . CHEST PAIN UNSPECIFIED 12/10/2008  . FATIGUE 08/29/2008  . RESTLESS LEGS SYNDROME 09/28/2007  . INSOMNIA 09/28/2007  . PALPITATIONS 09/28/2007  . Hypothyroidism 08/10/2007  . Hyperlipidemia 08/10/2007  . ANXIETY 08/10/2007  . Essential hypertension 08/10/2007  . ASTHMA 08/10/2007    [name],Hannah Morrow PTA 08/19/2015, 12:31 PM  [facility] [address], [address] [place], [place], [address] Phone: [phone]   Fax:  [phone]  Name: Hannah Morrow MRN: [id number] Date of Birth: 15-Oct-1940

## 2015-08-21 ENCOUNTER — Encounter: Payer: Self-pay | Admitting: Physical Therapy

## 2015-08-21 ENCOUNTER — Ambulatory Visit: Payer: Medicare Other | Admitting: Physical Therapy

## 2015-08-21 DIAGNOSIS — R29898 Other symptoms and signs involving the musculoskeletal system: Secondary | ICD-10-CM

## 2015-08-21 DIAGNOSIS — M25511 Pain in right shoulder: Secondary | ICD-10-CM | POA: Diagnosis not present

## 2015-08-21 DIAGNOSIS — M25551 Pain in right hip: Secondary | ICD-10-CM

## 2015-08-21 NOTE — Therapy (Signed)
[facility] [address] [place], [place], [address] Phone: [phone]   Fax:  [phone]  Physical Therapy Treatment  Patient Details  Name: Hannah Morrow MRN: [id number] Date of Birth: 06/19/1941 Referring Provider: Dr. Shirline Frees  Encounter Date: 08/21/2015      PT End of Session - 08/21/15 1153    Visit Number 6   Number of Visits 10   Date for PT Re-Evaluation 09/29/15   PT Start Time 1144   PT Stop Time 1229   PT Time Calculation (min) 45 min   Activity Tolerance Patient tolerated treatment well   Behavior During Therapy [facility] for tasks assessed/performed      Past Medical History  Diagnosis Date  . Hyperlipidemia   . Hypertension   . ANXIETY   . RESTLESS LEGS SYNDROME   . GERD   . Plantar fasciitis     right  . Hypothyroid   . Allergy     no meds  . Cancer [facility])     cervical cancer - hysterectomy    Past Surgical History  Procedure Laterality Date  . Abdominal hysterectomy    . Breast surgery      Bilateral breast reduction  . Wisdom tooth extraction    . Colostomy  04-22-2010    Arlyce Dice  . [facility] bx      cervix    There were no vitals filed for this visit.  Visit Diagnosis:  Hip pain, acute, right  Weakness of right hip  Pain in joint of right shoulder  Shoulder weakness      Subjective Assessment - 08/21/15 1151    Subjective Pt reports all over feeling better today, no weakness or shaking in Rt LE.    Pertinent History h/o cancer   Limitations Sitting   How long can you sit comfortably? sit with leaning on right leg causing ache in right leg   How long can you walk comfortably? increases right hip pain   Patient Stated Goals painfree   Currently in Pain? No/denies   Multiple Pain Sites No                         [facility] Adult PT Treatment/Exercise - 08/21/15 0001    Exercises   Exercises Knee/Hip   Lumbar Exercises: Stretches   Active Hamstring  Stretch 3 reps;20 seconds  on stairs, each leg   Single Knee to Chest Stretch 3 reps;20 seconds  diagonal   Piriformis Stretch 3 reps;20 seconds  in sitting, each leg   Knee/Hip Exercises: Aerobic   Nustep Seat & arms # 7, L1 x    Knee/Hip Exercises: Machines for Strengthening   Cybex Leg Press S#5 80# B LE, 40# each leg; 3x10     Knee/Hip Exercises: Standing   Rebounder 3 directions x each   Shoulder Exercises: Pulleys   Flexion 2 minutes   ABduction 2 minutes   Manual Therapy   Manual Therapy Muscle Energy Technique;Soft tissue mobilization;Passive ROM   Soft tissue mobilization Rt gluteas/piriformis area   Passive ROM ER/IR in prone, diagonal flexion Rt lLE                  PT Short Term Goals - 08/19/15 1210    PT SHORT TERM GOAL #1   Title independent with initial HEP   Time 4   Period Weeks   Status On-going   PT SHORT TERM GOAL #2  Title lay on right shoulder with pain decreased >/= 25%   Time 4   Period Weeks   Status On-going   PT SHORT TERM GOAL #3   Title walk with >/= 25% greater pain in right hip   Time 4   Period Weeks   Status On-going           PT Long Term Goals - 08/19/15 1210    PT LONG TERM GOAL #1   Title independent with HEP and understand how to progress herself   Time 8   Period Weeks   Status On-going   PT LONG TERM GOAL #2   Title walk and drive with no pain due to pelvis in correct alignment and improve hip capsule mobility   Time 8   Period Weeks   Status On-going   PT LONG TERM GOAL #3   Title lay on right shoulder with minimal pain and does not wake up    Time 8   Period Weeks   Status Achieved   PT LONG TERM GOAL #4   Title right shoulder strength 4+/5 so she is able to do her household tasks   Time 8   Period Weeks   Status On-going   PT LONG TERM GOAL #5   Title drive without right hip pain due to right hip strength >/= 4+/5   Time 8   Period Weeks   Status On-going               Plan  - 08/21/15 1153    Clinical Impression Statement Patient has no complain of pain in Rt hip. Pt continues to rates her improvement as 80%. Pt willcontinue to benefit from skilled PT to improve Rt hip strength and ROM. Still with TP and palpaple tenderness on Rt lat sacral border and gluteus area.     Pt will benefit from skilled therapeutic intervention in order to improve on the following deficits Pain;Decreased strength;Decreased mobility;Decreased activity tolerance;Decreased endurance;Decreased range of motion;Difficulty walking;Increased fascial restricitons;Increased muscle spasms;Abnormal gait   Rehab Potential Excellent   Clinical Impairments Affecting Rehab Potential history of cervical cancer   PT Frequency 2x / week   PT Duration 8 weeks   PT Treatment/Interventions Iontophoresis 4mg /ml Dexamethasone;Moist Heat;Therapeutic exercise;Therapeutic activities;Electrical Stimulation;Gait training;Manual techniques;Patient/family education;Neuromuscular re-education;Passive range of motion;Dry needling   PT Next Visit Plan Review stretches, work on LE strength   PT Home Exercise Plan stretches   Consulted and Agree with Plan of Care Patient        Problem List Patient Active Problem List   Diagnosis Date Noted  . Diarrhea 08/13/2011  . Plantar fasciitis 10/20/2010  . BACK PAIN 08/26/2010  . MYALGIA 01/09/2010  . DIZZINESS 01/02/2010  . UNSPECIFIED VITAMIN D DEFICIENCY 09/04/2009  . GERD 09/04/2009  . CHEST PAIN UNSPECIFIED 12/10/2008  . FATIGUE 08/29/2008  . RESTLESS LEGS SYNDROME 09/28/2007  . INSOMNIA 09/28/2007  . PALPITATIONS 09/28/2007  . Hypothyroidism 08/10/2007  . Hyperlipidemia 08/10/2007  . ANXIETY 08/10/2007  . Essential hypertension 08/10/2007  . ASTHMA 08/10/2007    [name],Anhar Mcdermott PTA 08/21/2015, 12:27 PM  [facility] [address] [place], [place], [address] Phone: [phone]   Fax:   [phone]  Name: KALLIYAH KURR MRN: [id number] Date of Birth: 01-Oct-1940

## 2015-08-26 ENCOUNTER — Ambulatory Visit: Payer: Medicare Other | Admitting: Physical Therapy

## 2015-08-26 ENCOUNTER — Encounter: Payer: Self-pay | Admitting: Physical Therapy

## 2015-08-26 DIAGNOSIS — M25511 Pain in right shoulder: Secondary | ICD-10-CM

## 2015-08-26 DIAGNOSIS — M25551 Pain in right hip: Secondary | ICD-10-CM

## 2015-08-26 DIAGNOSIS — R29898 Other symptoms and signs involving the musculoskeletal system: Secondary | ICD-10-CM

## 2015-08-26 NOTE — Therapy (Signed)
[facility] [address] [place], [place], [address] Phone: [phone]   Fax:  [phone]  Patient Details  Name: Hannah Morrow MRN: [id number] Date of Birth: 24-Jun-1941 Referring Provider:  [name], NP  Encounter Date: 08/26/2015   [name],Hannah Morrow PTA 08/26/2015, 12:21 PM  [facility] [address], [place], [place], [address] Phone: [phone]   Fax:  [phone]

## 2015-08-26 NOTE — Therapy (Signed)
[facility] [address] [place], [place], [address] Phone: [phone]   Fax:  [phone]  Physical Therapy Treatment  Patient Details  Name: Hannah Morrow MRN: [id number] Date of Birth: [birth year] Referring Provider: Dr. Shirline Frees  Encounter Date: 08/26/2015      PT End of Session - 08/26/15 1200    Visit Number 7   Number of Visits 10   Date for PT Re-Evaluation 09/29/15   PT Start Time 1150   PT Stop Time 1236   PT Time Calculation (min) 46 min   Activity Tolerance Patient tolerated treatment well   Behavior During Therapy [facility] for tasks assessed/performed      Past Medical History  Diagnosis Date  . Hyperlipidemia   . Hypertension   . ANXIETY   . RESTLESS LEGS SYNDROME   . GERD   . Plantar fasciitis     right  . Hypothyroid   . Allergy     no meds  . Cancer [facility])     cervical cancer - hysterectomy    Past Surgical History  Procedure Laterality Date  . Abdominal hysterectomy    . Breast surgery      Bilateral breast reduction  . Wisdom tooth extraction    . Colostomy  04-22-2010    Arlyce Dice  . [facility] bx      cervix    There were no vitals filed for this visit.  Visit Diagnosis:  Hip pain, acute, right  Weakness of right hip  Pain in joint of right shoulder  Shoulder weakness      Subjective Assessment - 08/26/15 1159    Subjective Pt reports no complains of pain, but still weakness and shaking in Rt LE but is improving   Pertinent History h/o cancer   Limitations Sitting   How long can you sit comfortably? sit with leaning on right leg causing ache in right leg   How long can you walk comfortably? increases right hip pain   Patient Stated Goals painfree   Currently in Pain? No/denies                         [facility] Adult PT Treatment/Exercise - 08/26/15 0001    Exercises   Exercises Knee/Hip   Lumbar Exercises: Stretches   Active Hamstring Stretch 3  reps;20 seconds  on stairs each leg   Single Knee to Chest Stretch 3 reps;20 seconds  diagonal   Piriformis Stretch 3 reps;20 seconds  in supine, each leg   Knee/Hip Exercises: Aerobic   Nustep S & arms #7, L1 x  0.72miles    Knee/Hip Exercises: Machines for Strengthening   Cybex Leg Press S#5 80# B LE, 40# each leg; 3x10    increase weigth next visit   Shoulder Exercises: Pulleys   Flexion 2 minutes   ABduction 2 minutes   Modalities   Modalities Iontophoresis   Iontophoresis   Type of Iontophoresis Dexamethasone   Location Rt ant shoulder   Dose #1 1ml   Time 6 hour wear time   Manual Therapy   Manual Therapy Muscle Energy Technique;Soft tissue mobilization;Passive ROM   Soft tissue mobilization Rt shoulder and Rt gluteal/piriformis area    Passive ROM ER/IR in prone, diagonal flexion Rt lLE                PT Education - 08/26/15 1316    Education provided Yes   Education Details Iontophoresis  Person(s) Educated Patient   Methods Explanation;Handout   Comprehension Verbalized understanding          PT Short Term Goals - 08/26/15 1204    PT SHORT TERM GOAL #1   Title independent with initial HEP   Time 4   Period Weeks   Status Achieved   PT SHORT TERM GOAL #2   Title lay on right shoulder with pain decreased >/= 25%   Time 4   Period Weeks   Status On-going   PT SHORT TERM GOAL #3   Title walk with >/= 25% greater pain in right hip   Time 4   Period Weeks   Status On-going           PT Long Term Goals - 08/19/15 1210    PT LONG TERM GOAL #1   Title independent with HEP and understand how to progress herself   Time 8   Period Weeks   Status On-going   PT LONG TERM GOAL #2   Title walk and drive with no pain due to pelvis in correct alignment and improve hip capsule mobility   Time 8   Period Weeks   Status On-going   PT LONG TERM GOAL #3   Title lay on right shoulder with minimal pain and does not wake up    Time 8   Period  Weeks   Status Achieved   PT LONG TERM GOAL #4   Title right shoulder strength 4+/5 so she is able to do her household tasks   Time 8   Period Weeks   Status On-going   PT LONG TERM GOAL #5   Title drive without right hip pain due to right hip strength >/= 4+/5   Time 8   Period Weeks   Status On-going               Plan - 08/26/15 1201    Clinical Impression Statement Pt continues to improve with strenth and endurance. Pt still with palpaple TP and tenderness on Rt lat sacral border and gluteus area   Pt will benefit from skilled therapeutic intervention in order to improve on the following deficits Pain;Decreased strength;Decreased mobility;Decreased activity tolerance;Decreased endurance;Decreased range of motion;Difficulty walking;Increased fascial restricitons;Increased muscle spasms;Abnormal gait   Rehab Potential Excellent   Clinical Impairments Affecting Rehab Potential history of cervical cancer   PT Frequency 2x / week   PT Duration 8 weeks   PT Treatment/Interventions Iontophoresis 4mg /ml Dexamethasone;Moist Heat;Therapeutic exercise;Therapeutic activities;Electrical Stimulation;Gait training;Manual techniques;Patient/family education;Neuromuscular re-education;Passive range of motion;Dry needling   PT Next Visit Plan Ionto patch #2, Review stretches, work on LE strength   PT Home Exercise Plan stretches   Consulted and Agree with Plan of Care Patient        Problem List Patient Active Problem List   Diagnosis Date Noted  . Diarrhea 08/13/2011  . Plantar fasciitis 10/20/2010  . BACK PAIN 08/26/2010  . MYALGIA 01/09/2010  . DIZZINESS 01/02/2010  . UNSPECIFIED VITAMIN D DEFICIENCY 09/04/2009  . GERD 09/04/2009  . CHEST PAIN UNSPECIFIED 12/10/2008  . FATIGUE 08/29/2008  . RESTLESS LEGS SYNDROME 09/28/2007  . INSOMNIA 09/28/2007  . PALPITATIONS 09/28/2007  . Hypothyroidism 08/10/2007  . Hyperlipidemia 08/10/2007  . ANXIETY 08/10/2007  . Essential  hypertension 08/10/2007  . ASTHMA 08/10/2007    [name],Hannah Morrow PTA 08/26/2015, 1:21 PM  [facility] [address], [address] [place], [place], [address] Phone: [phone]   Fax:  [phone]  Name: Hannah  GLENDY Morrow MRN: [id number] Date of Birth: [birth year]

## 2015-08-26 NOTE — Patient Instructions (Signed)

## 2015-08-28 ENCOUNTER — Ambulatory Visit: Payer: Medicare Other | Admitting: Physical Therapy

## 2015-08-28 ENCOUNTER — Encounter: Payer: Self-pay | Admitting: Physical Therapy

## 2015-08-28 DIAGNOSIS — R29898 Other symptoms and signs involving the musculoskeletal system: Secondary | ICD-10-CM

## 2015-08-28 DIAGNOSIS — M25511 Pain in right shoulder: Secondary | ICD-10-CM | POA: Diagnosis not present

## 2015-08-28 DIAGNOSIS — M25551 Pain in right hip: Secondary | ICD-10-CM | POA: Diagnosis not present

## 2015-08-28 NOTE — Therapy (Signed)
[facility] [address] [place], [place], [address] Phone: [phone]   Fax:  [phone]  Physical Therapy Treatment  Patient Details  Name: Hannah Morrow MRN: [id number] Date of Birth: 12-25-1940 Referring Provider: Dr. [name]  Encounter Date: 08/28/2015      PT End of Session - 08/28/15 1612    Visit Number 8   Number of Visits 10  medicare   Date for PT Re-Evaluation 09/29/15   Authorization Type Medicare/Tricare; Irven [place]; Kx on 15th visit   PT Start Time 1530   PT Stop Time 1610   PT Time Calculation (min) 40 min   Activity Tolerance Patient tolerated treatment well   Behavior During Therapy [facility] for tasks assessed/performed      Past Medical History  Diagnosis Date  . Hyperlipidemia   . Hypertension   . ANXIETY   . RESTLESS LEGS SYNDROME   . GERD   . Plantar fasciitis     right  . Hypothyroid   . Allergy     no meds  . Cancer [facility])     cervical cancer - hysterectomy    Past Surgical History  Procedure Laterality Date  . Abdominal hysterectomy    . Breast surgery      Bilateral breast reduction  . Wisdom tooth extraction    . Colostomy  04-22-2010    [name]  . [facility] bx      cervix    There were no vitals filed for this visit.  Visit Diagnosis:  Pain in joint of right shoulder  Shoulder weakness      Subjective Assessment - 08/28/15 1537    Subjective I was able to do more painiting with arm. My right leg is good.  I want to focus on my shoulder.    Pertinent History h/o cancer   Limitations Sitting   How long can you sit comfortably? sit with leaning on right leg causing ache in right leg   How long can you walk comfortably? increases right hip pain   Patient Stated Goals painfree   Currently in Pain? Yes   Pain Score 3    Pain Location Shoulder   Pain Orientation Right   Pain Descriptors / Indicators Sharp   Pain Type Acute pain   Pain Onset More than a month  ago   Pain Frequency Intermittent   Aggravating Factors  walking,driving   Pain Relieving Factors stop walking, not sitting in car   Effect of Pain on Daily Activities walking her dog   Multiple Pain Sites No                         [facility] Adult PT Treatment/Exercise - 08/28/15 0001    Shoulder Exercises: Supine   Other Supine Exercises diagonals without resistance but increased pain in  right shoulder   Shoulder Exercises: Pulleys   Flexion 2 minutes   ABduction 2 minutes   Shoulder Exercises: ROM/Strengthening   Other ROM/Strengthening Exercises apist guiding scapula   Modalities   Modalities Iontophoresis   Iontophoresis   Type of Iontophoresis Dexamethasone   Location Rt ant shoulder   Dose #2 53ml   Time 6 hour wear time   Manual Therapy   Manual Therapy Scapular mobilization;Soft tissue mobilization;Joint mobilization;Passive ROM   Joint Mobilization rotational and P-A mobilization to T1-T6 to decrease cervical thoracic kyphosis; joint mobization to right shoulder for anterior, inferior posterior glide   Soft tissue mobilization  soft tissue work to righ tRTC muscles, upper trap, biceps and pectoralis   Passive ROM all 4 directions                PT Education - 08/28/15 1611    Education provided Yes   Education Details doorway stretch, supine horizontal abduction with yellow band   Person(s) Educated Patient   Methods Explanation;Demonstration;Verbal cues;Handout   Comprehension Returned demonstration;Verbalized understanding          PT Short Term Goals - 08/26/15 1204    PT SHORT TERM GOAL #1   Title independent with initial HEP   Time 4   Period Weeks   Status Achieved   PT SHORT TERM GOAL #2   Title lay on right shoulder with pain decreased >/= 25%   Time 4   Period Weeks   Status On-going   PT SHORT TERM GOAL #3   Title walk with >/= 25% greater pain in right hip   Time 4   Period Weeks   Status On-going           PT  Long Term Goals - 08/19/15 1210    PT LONG TERM GOAL #1   Title independent with HEP and understand how to progress herself   Time 8   Period Weeks   Status On-going   PT LONG TERM GOAL #2   Title walk and drive with no pain due to pelvis in correct alignment and improve hip capsule mobility   Time 8   Period Weeks   Status On-going   PT LONG TERM GOAL #3   Title lay on right shoulder with minimal pain and does not wake up    Time 8   Period Weeks   Status Achieved   PT LONG TERM GOAL #4   Title right shoulder strength 4+/5 so she is able to do her household tasks   Time 8   Period Weeks   Status On-going   PT LONG TERM GOAL #5   Title drive without right hip pain due to right hip strength >/= 4+/5   Time 8   Period Weeks   Status On-going               Plan - 08/28/15 1612    Clinical Impression Statement Patient has reduced pain in right shoulder after treatment.  Patient had increased mobility of upper thoracic spine and right shoulder after joint mobilzation. Patient needs to work on her posture and back strength.    Pt will benefit from skilled therapeutic intervention in order to improve on the following deficits Pain;Decreased strength;Decreased mobility;Decreased activity tolerance;Decreased endurance;Decreased range of motion;Difficulty walking;Increased fascial restricitons;Increased muscle spasms;Abnormal gait   Rehab Potential Excellent   Clinical Impairments Affecting Rehab Potential history of cervical cancer   PT Frequency 2x / week   PT Duration 8 weeks   PT Treatment/Interventions Iontophoresis 4mg /ml Dexamethasone;Moist Heat;Therapeutic exercise;Therapeutic activities;Electrical Stimulation;Gait training;Manual techniques;Patient/family education;Neuromuscular re-education;Passive range of motion;Dry needling   PT Next Visit Plan Ionto patch #2, Review stretches, work on LE strength; joint mobilization to shoulder and upper thoracic   PT Home Exercise  Plan progress as needed   Consulted and Agree with Plan of Care Patient        Problem List Patient Active Problem List   Diagnosis Date Noted  . Diarrhea 08/13/2011  . Plantar fasciitis 10/20/2010  . BACK PAIN 08/26/2010  . MYALGIA 01/09/2010  . DIZZINESS 01/02/2010  . UNSPECIFIED VITAMIN D DEFICIENCY 09/04/2009  .  GERD 09/04/2009  . CHEST PAIN UNSPECIFIED 12/10/2008  . FATIGUE 08/29/2008  . RESTLESS LEGS SYNDROME 09/28/2007  . INSOMNIA 09/28/2007  . PALPITATIONS 09/28/2007  . Hypothyroidism 08/10/2007  . Hyperlipidemia 08/10/2007  . ANXIETY 08/10/2007  . Essential hypertension 08/10/2007  . ASTHMA 08/10/2007    Earlie Counts, PT 08/28/2015 4:15 PM   [facility] [address] [place], [place], [address] Phone: [phone]   Fax:  [phone]  Name: GAYATHRI VIALPANDO MRN: [id number] Date of Birth: 10/20/1940

## 2015-08-28 NOTE — Patient Instructions (Signed)
Resisted Horizontal Abduction: Bilateral   Sit or stand, tubing in both hands, arms out in front. Keeping arms straight, pinch shoulder blades together and stretch arms out. Repeat _10___ times per set. Do 2____ sets per session. Do _1-2___ sessions per day. Yellow band. Lay down   CHEST: Doorway, Bilateral - Standing    Standing in doorway, place hands on wall with elbows bent at shoulder height. Lean forward. Hold _15__ seconds. __2_ reps per set, _1__ sets per day, _2__ days per week  Copyright  VHI. All rights reserved.   [facility] [address] [place], [place] [address] Phone # [phone] Fax [phone]

## 2015-09-02 ENCOUNTER — Ambulatory Visit: Payer: Medicare Other

## 2015-09-02 DIAGNOSIS — M25511 Pain in right shoulder: Secondary | ICD-10-CM

## 2015-09-02 DIAGNOSIS — R29898 Other symptoms and signs involving the musculoskeletal system: Secondary | ICD-10-CM | POA: Diagnosis not present

## 2015-09-02 DIAGNOSIS — M25551 Pain in right hip: Secondary | ICD-10-CM | POA: Diagnosis not present

## 2015-09-02 NOTE — Patient Instructions (Signed)

## 2015-09-02 NOTE — Therapy (Signed)
[facility] [address] [place], [place], [address] Phone: [phone]   Fax:  [phone]  Physical Therapy Treatment  Patient Details  Name: Hannah Morrow MRN: [id number] Date of Birth: 06-Sep-1940 Referring Provider: Dr. Shirline Frees  Encounter Date: 09/02/2015      PT End of Session - 09/02/15 1208    Visit Number 9   Number of Visits 10   Date for PT Re-Evaluation 09/29/15   Authorization Type Medicare/Tricare; Lauretta Chester; Kx on 15th visit   PT Start Time 1139   PT Stop Time 1219   PT Time Calculation (min) 40 min   Activity Tolerance Patient tolerated treatment well   Behavior During Therapy [facility] for tasks assessed/performed      Past Medical History  Diagnosis Date  . Hyperlipidemia   . Hypertension   . ANXIETY   . RESTLESS LEGS SYNDROME   . GERD   . Plantar fasciitis     right  . Hypothyroid   . Allergy     no meds  . Cancer [facility])     cervical cancer - hysterectomy    Past Surgical History  Procedure Laterality Date  . Abdominal hysterectomy    . Breast surgery      Bilateral breast reduction  . Wisdom tooth extraction    . Colostomy  04-22-2010    Arlyce Dice  . [facility] bx      cervix    There were no vitals filed for this visit.  Visit Diagnosis:  Pain in joint of right shoulder  Shoulder weakness  Weakness of right hip      Subjective Assessment - 09/02/15 1142    Subjective Pt reports so much pain after last session that she couldn't move her arm for 3 days.  Pt rates pain as 6-7/10 after last session.  Feeling better now. Hip is not bothering pt.     Currently in Pain? Yes   Pain Score 3    Pain Location Shoulder   Pain Orientation Right   Pain Descriptors / Indicators Aching   Pain Type Acute pain   Pain Radiating Towards Rt elbow   Pain Onset More than a month ago   Pain Frequency Intermittent   Aggravating Factors  use of the Rt arm, driving   Pain Relieving Factors not  moving the arm                         [facility] Adult PT Treatment/Exercise - 09/02/15 0001    Lumbar Exercises: Stretches   Active Hamstring Stretch 3 reps;20 seconds  on stairs each leg   Single Knee to Chest Stretch 3 reps;20 seconds  diagonal   Piriformis Stretch 3 reps;20 seconds  in supine, each leg   Knee/Hip Exercises: Aerobic   Nustep Level 2 x 8 minutes  seat 7, arms 7   Shoulder Exercises: Supine   Horizontal ABduction Strengthening;Both;20 reps;Theraband   Theraband Level (Shoulder Horizontal ABduction) Level 1 (Yellow)   Shoulder Exercises: Standing   Flexion AAROM;Strengthening;Right;20 reps  using finger ladder   Flexion Limitations using finger ladder   ABduction Strengthening;AAROM;Right;10 reps  using finger ladder   ABduction Limitations using finger ladder   Shoulder Exercises: Pulleys   Flexion 3 minutes   ABduction 2 minutes   Shoulder Exercises: Stretch   Corner Stretch 3 reps;20 seconds   Modalities   Modalities Iontophoresis   Iontophoresis   Type of Iontophoresis Dexamethasone  Location Rt ant shoulder   Dose 1 ml. #3   Time 6 hour wear time                PT Education - 09/02/15 1153    Education provided Yes   Education Details ionto instruction   Person(s) Educated Patient   Methods Explanation;Demonstration;Handout   Comprehension Verbalized understanding;Returned demonstration          PT Short Term Goals - 09/02/15 1144    PT SHORT TERM GOAL #1   Title independent with initial HEP   Status Achieved   PT SHORT TERM GOAL #2   Title lay on right shoulder with pain decreased >/= 25%   Time 4   Period Weeks   Status Achieved   PT SHORT TERM GOAL #3   Title walk with >/= 25% greater pain in right hip   Status Achieved           PT Long Term Goals - 09/02/15 1146    PT LONG TERM GOAL #1   Title independent with HEP and understand how to progress herself   Time 8   Period Weeks   Status On-going    PT LONG TERM GOAL #3   Title lay on right shoulder with minimal pain and does not wake up    Status Achieved               Plan - 09/02/15 1146    Clinical Impression Statement Pt denies any Rt hip pain for a couple of weeks.  No limitations related to Rt hip at this time.  Pt with flare-up of Rt shoulder pain after last session.  Pt rated the pain as 6/10 and had to rest her arm for 4 days.  Pain has resolved now and is down to 3/10.  Pt will continue to benefit from skilled PT for Rt shoulder AROM, strength, endurance and pain management as needed.     Pt will benefit from skilled therapeutic intervention in order to improve on the following deficits Pain;Decreased strength;Decreased mobility;Decreased activity tolerance;Decreased endurance;Decreased range of motion;Difficulty walking;Increased fascial restricitons;Increased muscle spasms;Abnormal gait   Rehab Potential Excellent   PT Frequency 2x / week   PT Duration 8 weeks   PT Treatment/Interventions Iontophoresis 4mg /ml Dexamethasone;Moist Heat;Therapeutic exercise;Therapeutic activities;Electrical Stimulation;Gait training;Manual techniques;Patient/family education;Neuromuscular re-education;Passive range of motion;Dry needling   PT Next Visit Plan Ionto patch, Rt shoulder AROM and strength as tolertated, G-codes next session   Consulted and Agree with Plan of Care Patient        Problem List Patient Active Problem List   Diagnosis Date Noted  . Diarrhea 08/13/2011  . Plantar fasciitis 10/20/2010  . BACK PAIN 08/26/2010  . MYALGIA 01/09/2010  . DIZZINESS 01/02/2010  . UNSPECIFIED VITAMIN D DEFICIENCY 09/04/2009  . GERD 09/04/2009  . CHEST PAIN UNSPECIFIED 12/10/2008  . FATIGUE 08/29/2008  . RESTLESS LEGS SYNDROME 09/28/2007  . INSOMNIA 09/28/2007  . PALPITATIONS 09/28/2007  . Hypothyroidism 08/10/2007  . Hyperlipidemia 08/10/2007  . ANXIETY 08/10/2007  . Essential hypertension 08/10/2007  . ASTHMA 08/10/2007     Dali Kraner, PT 09/02/2015, 12:09 PM  [facility] [address] [place], [place], [address] Phone: [phone]   Fax:  [phone]  Name: KHADEJA DEADMOND MRN: [id number] Date of Birth: 23-Jun-1941

## 2015-09-04 ENCOUNTER — Ambulatory Visit: Payer: Medicare Other | Attending: Adult Health | Admitting: Physical Therapy

## 2015-09-04 ENCOUNTER — Encounter: Payer: Self-pay | Admitting: Physical Therapy

## 2015-09-04 DIAGNOSIS — M25511 Pain in right shoulder: Secondary | ICD-10-CM | POA: Diagnosis not present

## 2015-09-04 DIAGNOSIS — M25551 Pain in right hip: Secondary | ICD-10-CM | POA: Insufficient documentation

## 2015-09-04 DIAGNOSIS — R29898 Other symptoms and signs involving the musculoskeletal system: Secondary | ICD-10-CM | POA: Insufficient documentation

## 2015-09-04 NOTE — Therapy (Signed)
[facility] [address], [place], [place], [address] Phone: [phone]   Fax:  [phone]  Physical Therapy Treatment  Patient Details  Name: Hannah Morrow MRN: [id number] Date of Birth: 10/23/1940 Referring Provider: Dr. [name]  Encounter Date: 09/04/2015      PT End of Session - 09/04/15 1149    Visit Number 10   Number of Visits 20  Medicare   Date for PT Re-Evaluation 09/29/15   Authorization Type Medicare/Tricare; Irven [place]; Kx on 15th visit   PT Start Time 1145   PT Stop Time 1225   PT Time Calculation (min) 40 min   Activity Tolerance Patient tolerated treatment well   Behavior During Therapy [facility] for tasks assessed/performed      Past Medical History  Diagnosis Date  . Hyperlipidemia   . Hypertension   . ANXIETY   . RESTLESS LEGS SYNDROME   . GERD   . Plantar fasciitis     right  . Hypothyroid   . Allergy     no meds  . Cancer [facility])     cervical cancer - hysterectomy    Past Surgical History  Procedure Laterality Date  . Abdominal hysterectomy    . Breast surgery      Bilateral breast reduction  . Wisdom tooth extraction    . Colostomy  04-22-2010    [name]  . [facility] bx      cervix    There were no vitals filed for this visit.  Visit Diagnosis:  Pain in joint of right shoulder  Shoulder weakness      Subjective Assessment - 09/04/15 1150    Subjective I was able to sleep on right shoulder last night.    Pertinent History h/o cancer   Limitations Sitting   How long can you sit comfortably? sit with leaning on right leg causing ache in right leg   How long can you walk comfortably? increases right hip pain   Patient Stated Goals painfree   Currently in Pain? Yes   Pain Score 1    Pain Location Shoulder   Pain Orientation Right   Pain Descriptors / Indicators Aching   Pain Type Acute pain   Pain Radiating Towards right elbow   Pain Onset More than a month ago   Pain Frequency Intermittent   Aggravating Factors  use of the right arm,dirving   Pain Relieving Factors not moving the arm   Effect of Pain on Daily Activities walking her dog   Multiple Pain Sites No            [facility] PT Assessment - 09/04/15 0001    Observation/Other Assessments   Focus on Therapeutic Outcomes (FOTO)  33% limitation   AROM   Right Shoulder Flexion 160 Degrees   Right Shoulder ABduction 165 Degrees   Strength   Right Shoulder Flexion 4/5   Right Shoulder ABduction 4/5   Right Shoulder Internal Rotation 5/5   Right Shoulder External Rotation 4-/5   Right Hip Flexion 5/5   Right Hip ABduction 4+/5   Palpation   SI assessment  pelvis in correct alignment                     [facility] Adult PT Treatment/Exercise - 09/04/15 0001    Shoulder Exercises: Standing   Flexion AAROM;Strengthening;Right;20 reps  using finger ladder   Flexion Limitations using finger ladder   ABduction Strengthening;AAROM;Right;10 reps  using finger ladder  ABduction Limitations using finger ladder; therapist guides scapula   Other Standing Exercises stand face the wall bring arms in Y movement and lift off wall 10x; facing wall shoulder flexion and off wall 10x; then ER movement off wall 10x   Other Standing Exercises Lat bar 20# 10 x 3   Shoulder Exercises: Pulleys   Flexion 3 minutes   ABduction 3 minutes   Modalities   Modalities Iontophoresis   Iontophoresis   Type of Iontophoresis Dexamethasone   Location Rt ant shoulder   Dose 1 ml. #4   Time 6 hour wear time                PT Education - 10-Sep-2015 1217    Education provided No          PT Short Term Goals - 09/02/15 1144    PT SHORT TERM GOAL #1   Title independent with initial HEP   Status Achieved   PT SHORT TERM GOAL #2   Title lay on right shoulder with pain decreased >/= 25%   Time 4   Period Weeks   Status Achieved   PT SHORT TERM GOAL #3   Title walk with >/= 25% greater pain in  right hip   Status Achieved           PT Long Term Goals - 09/02/15 1146    PT LONG TERM GOAL #1   Title independent with HEP and understand how to progress herself   Time 8   Period Weeks   Status On-going   PT LONG TERM GOAL #3   Title lay on right shoulder with minimal pain and does not wake up    Status Achieved               Plan - 09/10/15 1225    Clinical Impression Statement Patient is not having hip pain.  Patient has increased right shoulder ROM and strength.  Patient has difficulty with relaxing her right upper trap with shoulder movements and needs tactle with verbal cues.  Patient is able to so more overhead movement with less pain. Patient will benefit  from further therapy to reduce right shoulder pain and improve shoulder ROM and strneght.    Pt will benefit from skilled therapeutic intervention in order to improve on the following deficits Pain;Decreased strength;Decreased mobility;Decreased activity tolerance;Decreased endurance;Decreased range of motion;Difficulty walking;Increased fascial restricitons;Increased muscle spasms;Abnormal gait   Rehab Potential Excellent   Clinical Impairments Affecting Rehab Potential history of cervical cancer   PT Frequency 2x / week   PT Duration 8 weeks   PT Treatment/Interventions Iontophoresis 4mg /ml Dexamethasone;Moist Heat;Therapeutic exercise;Therapeutic activities;Electrical Stimulation;Gait training;Manual techniques;Patient/family education;Neuromuscular re-education;Passive range of motion;Dry needling   PT Next Visit Plan Ionto patch, Rt shoulder AROM and strength as tolertated,   PT Home Exercise Plan progress as needed   Consulted and Agree with Plan of Care Patient          G-Codes - 2015-09-10 1200    Functional Assessment Tool Used FOTO score is 33% limitation   Functional Limitation Other PT primary   Other PT Primary Current Status [id number]) At least 20 percent but less than 40 percent impaired, limited  or restricted   Other PT Primary Goal Status [id number]) At least 20 percent but less than 40 percent impaired, limited or restricted      Problem List Patient Active Problem List   Diagnosis Date Noted  . Diarrhea 08/13/2011  . Plantar fasciitis 10/20/2010  . BACK  PAIN 08/26/2010  . MYALGIA 01/09/2010  . DIZZINESS 01/02/2010  . UNSPECIFIED VITAMIN D DEFICIENCY 09/04/2009  . GERD 09/04/2009  . CHEST PAIN UNSPECIFIED 12/10/2008  . FATIGUE 08/29/2008  . RESTLESS LEGS SYNDROME 09/28/2007  . INSOMNIA 09/28/2007  . PALPITATIONS 09/28/2007  . Hypothyroidism 08/10/2007  . Hyperlipidemia 08/10/2007  . ANXIETY 08/10/2007  . Essential hypertension 08/10/2007  . ASTHMA 08/10/2007    Earlie Counts, PT 09/04/2015 12:28 PM    [facility] [address] [place], [place], [address] Phone: [phone]   Fax:  [phone]  Name: Hannah Morrow MRN: [id number] Date of Birth: 09/30/1940

## 2015-09-09 ENCOUNTER — Ambulatory Visit: Payer: Medicare Other

## 2015-09-09 DIAGNOSIS — M25511 Pain in right shoulder: Secondary | ICD-10-CM

## 2015-09-09 DIAGNOSIS — M25551 Pain in right hip: Secondary | ICD-10-CM | POA: Diagnosis not present

## 2015-09-09 DIAGNOSIS — R29898 Other symptoms and signs involving the musculoskeletal system: Secondary | ICD-10-CM

## 2015-09-09 NOTE — Therapy (Addendum)
[facility] [address] [place], [place], [address] Phone: [phone]   Fax:  [phone]  Physical Therapy Treatment  Patient Details  Name: Hannah Morrow MRN: [id number] Date of Birth: 1940/12/27 Referring Provider: Dr. [name]  Encounter Date: 09/09/2015      PT End of Session - 09/09/15 1212    Visit Number 11   Number of Visits 20   Date for PT Re-Evaluation 09/29/15   Authorization Type Medicare/Tricare; Irven [place]; Kx on 15th visit   PT Start Time 1144   PT Stop Time 1225   PT Time Calculation (min) 41 min   Activity Tolerance Patient tolerated treatment well   Behavior During Therapy [facility] for tasks assessed/performed      Past Medical History  Diagnosis Date  . Hyperlipidemia   . Hypertension   . ANXIETY   . RESTLESS LEGS SYNDROME   . GERD   . Plantar fasciitis     right  . Hypothyroid   . Allergy     no meds  . Cancer [facility])     cervical cancer - hysterectomy    Past Surgical History  Procedure Laterality Date  . Abdominal hysterectomy    . Breast surgery      Bilateral breast reduction  . Wisdom tooth extraction    . Colostomy  04-22-2010    Hannah Morrow  . [facility] bx      cervix    There were no vitals filed for this visit.  Visit Diagnosis:  Pain in joint of right shoulder  Shoulder weakness      Subjective Assessment - 09/09/15 1152    Subjective Pt reports 70% overall improvement in Rt shoulder pain since the start of care.     Currently in Pain? Yes   Pain Score 3    Pain Location Shoulder   Pain Orientation Right   Pain Descriptors / Indicators Aching   Pain Type Acute pain   Pain Onset More than a month ago   Pain Frequency Intermittent   Aggravating Factors  use of Rt arm, sleep at night   Pain Relieving Factors not moving arm                         [facility] Adult PT Treatment/Exercise - 09/09/15 0001    Knee/Hip Exercises: Aerobic   Nustep Level  2 x 8 minutes  seat 7, arms 7   Shoulder Exercises: Standing   Flexion AAROM;Strengthening;Right;20 reps  using finger ladder   Shoulder Flexion Weight (lbs) 1   ABduction Strengthening;AAROM;Right;10 reps  using finger ladder   Shoulder ABduction Weight (lbs) 1  weight removed for 2nd set of 10   ABduction Limitations using finger ladder; therapist guides scapula   Other Standing Exercises Lat bar 20# 10 x 3   Shoulder Exercises: Pulleys   Flexion 3 minutes   ABduction 3 minutes   Modalities   Modalities Iontophoresis   Iontophoresis   Type of Iontophoresis Dexamethasone   Location Rt ant shoulder   Dose 1 ml. #5   Time 6 hour wear time                PT Education - 09/09/15 1224    Education provided Yes   Education Details ionto   Person(s) Educated Patient   Methods Explanation;Handout   Comprehension Verbalized understanding          PT Short Term Goals - 09/02/15 1144  PT SHORT TERM GOAL #1   Title independent with initial HEP   Status Achieved   PT SHORT TERM GOAL #2   Title lay on right shoulder with pain decreased >/= 25%   Time 4   Period Weeks   Status Achieved   PT SHORT TERM GOAL #3   Title walk with >/= 25% greater pain in right hip   Status Achieved           PT Long Term Goals - 09/09/15 1153    PT LONG TERM GOAL #1   Title independent with HEP and understand how to progress herself   Time 8   Period Weeks   Status On-going   PT LONG TERM GOAL #2   Title walk and drive with no pain due to pelvis in correct alignment and improve hip capsule mobility   Time 8   Period Weeks   Status On-going   PT LONG TERM GOAL #3   Title lay on right shoulder with minimal pain and does not wake up    Time 8   Period Weeks   Status Achieved   PT LONG TERM GOAL #5   Title drive without right hip pain due to right hip strength >/= 4+/5   Time 8   Period Weeks   Status On-going               Plan - 09/09/15 1154    Clinical  Impression Statement Pt reports 70% overall Rt shoulder pain since the start of care.  Rt hip pain is intermittent and 2/10 when it occurs.  Pt with improved scapular position with pulleys today.  Pt is able to tolerate increased weight training with Rt UE without limitation.  Pt will continue to benefit from skilled PT for 1 more ionto patch, strength progression for hip and shoulder and pain management as needed.     Pt will benefit from skilled therapeutic intervention in order to improve on the following deficits Pain;Decreased strength;Decreased mobility;Decreased activity tolerance;Decreased endurance;Decreased range of motion;Difficulty walking;Increased fascial restricitons;Increased muscle spasms;Abnormal gait   Rehab Potential Excellent   PT Frequency 2x / week   PT Duration 8 weeks   PT Treatment/Interventions Iontophoresis 4mg /ml Dexamethasone;Moist Heat;Therapeutic exercise;Therapeutic activities;Electrical Stimulation;Gait training;Manual techniques;Patient/family education;Neuromuscular re-education;Passive range of motion;Dry needling   PT Next Visit Plan Ionto patch, Rt shoulder AROM and strength as tolertated,   Consulted and Agree with Plan of Care Patient        Problem List Patient Active Problem List   Diagnosis Date Noted  . Diarrhea 08/13/2011  . Plantar fasciitis 10/20/2010  . BACK PAIN 08/26/2010  . MYALGIA 01/09/2010  . DIZZINESS 01/02/2010  . UNSPECIFIED VITAMIN D DEFICIENCY 09/04/2009  . GERD 09/04/2009  . CHEST PAIN UNSPECIFIED 12/10/2008  . FATIGUE 08/29/2008  . RESTLESS LEGS SYNDROME 09/28/2007  . INSOMNIA 09/28/2007  . PALPITATIONS 09/28/2007  . Hypothyroidism 08/10/2007  . Hyperlipidemia 08/10/2007  . ANXIETY 08/10/2007  . Essential hypertension 08/10/2007  . ASTHMA 08/10/2007    TAKACS,KELLY, PT 09/09/2015, 12:24 PM  [facility] [address] [place], [place], [address] Phone:  [phone]   Fax:  [phone]  Name: TOBI Morrow MRN: [id number] Date of Birth: Apr 27, 1941

## 2015-09-09 NOTE — Patient Instructions (Signed)

## 2015-09-11 ENCOUNTER — Ambulatory Visit: Payer: Medicare Other

## 2015-09-11 DIAGNOSIS — M25511 Pain in right shoulder: Secondary | ICD-10-CM | POA: Diagnosis not present

## 2015-09-11 DIAGNOSIS — M25551 Pain in right hip: Secondary | ICD-10-CM

## 2015-09-11 DIAGNOSIS — R29898 Other symptoms and signs involving the musculoskeletal system: Secondary | ICD-10-CM | POA: Diagnosis not present

## 2015-09-11 NOTE — Therapy (Signed)
[facility] [address], [place], [place], [address] Phone: [phone]   Fax:  [phone]  Physical Therapy Treatment  Patient Details  Name: Hannah Morrow MRN: [id number] Date of Birth: Sep 19, 1940 Referring Provider: Dr. [name]  Encounter Date: 09/11/2015      PT End of Session - 09/11/15 1219    Visit Number 12   Number of Visits 20   Date for PT Re-Evaluation 09/29/15   Authorization Type Medicare/Tricare; Irven [place]; Kx on 15th visit   PT Start Time 1146   PT Stop Time 1226   PT Time Calculation (min) 40 min   Activity Tolerance Patient tolerated treatment well   Behavior During Therapy [facility] for tasks assessed/performed      Past Medical History  Diagnosis Date  . Hyperlipidemia   . Hypertension   . ANXIETY   . RESTLESS LEGS SYNDROME   . GERD   . Plantar fasciitis     right  . Hypothyroid   . Allergy     no meds  . Cancer [facility])     cervical cancer - hysterectomy    Past Surgical History  Procedure Laterality Date  . Abdominal hysterectomy    . Breast surgery      Bilateral breast reduction  . Wisdom tooth extraction    . Colostomy  04-22-2010    [name]  . [facility]x      cervix    There were no vitals filed for this visit.  Visit Diagnosis:  Pain in joint of right shoulder  Shoulder weakness  Weakness of right hip  Hip pain, acute, right      Subjective Assessment - 09/11/15 1204    Subjective Pt reports 70% overall improvement in Rt shoulder pain since the start of care.  Rt gluteal pain this morning.   Currently in Pain? Yes   Pain Score 2    Pain Location Shoulder   Pain Orientation Right   Pain Descriptors / Indicators Aching   Pain Type Acute pain                         [facility] Adult PT Treatment/Exercise - 09/11/15 0001    Lumbar Exercises: Stretches   Active Hamstring Stretch 20 seconds;1 rep  on stairs each leg   Piriformis Stretch 3 reps;20  seconds  in supine, each leg   Knee/Hip Exercises: Aerobic   Nustep Level 2 x 6 minutes  seat 7, arms 7   Shoulder Exercises: Standing   Flexion AAROM;Strengthening;Right;20 reps  using finger ladder   Shoulder Flexion Weight (lbs) 1   ABduction Strengthening;AAROM;Right;10 reps  using finger ladder   Shoulder ABduction Weight (lbs) 1  weight removed for 2nd set of 10   ABduction Limitations using finger ladder; therapist guides scapula   Other Standing Exercises Lat bar 20# 10 x 3   Shoulder Exercises: Pulleys   Flexion 3 minutes   ABduction 3 minutes   Shoulder Exercises: ROM/Strengthening   UBE (Upper Arm Bike) Level 1 x 6 minutes (3/3)   Modalities   Modalities Iontophoresis   Iontophoresis   Type of Iontophoresis Dexamethasone   Location Rt ant shoulder   Dose 1 ml. #6   Time 6 hour wear time                PT Education - 09/11/15 1203    Education provided Yes   Education Details ionto instructions   Person(s)  Educated Patient   Methods Explanation;Handout   Comprehension Verbalized understanding          PT Short Term Goals - 09/02/15 1144    PT SHORT TERM GOAL #1   Title independent with initial HEP   Status Achieved   PT SHORT TERM GOAL #2   Title lay on right shoulder with pain decreased >/= 25%   Time 4   Period Weeks   Status Achieved   PT SHORT TERM GOAL #3   Title walk with >/= 25% greater pain in right hip   Status Achieved           PT Long Term Goals - 09/09/15 1153    PT LONG TERM GOAL #1   Title independent with HEP and understand how to progress herself   Time 8   Period Weeks   Status On-going   PT LONG TERM GOAL #2   Title walk and drive with no pain due to pelvis in correct alignment and improve hip capsule mobility   Time 8   Period Weeks   Status On-going   PT LONG TERM GOAL #3   Title lay on right shoulder with minimal pain and does not wake up    Time 8   Period Weeks   Status Achieved   PT LONG TERM GOAL #5    Title drive without right hip pain due to right hip strength >/= 4+/5   Time 8   Period Weeks   Status On-going               Plan - 09/11/15 1157    Clinical Impression Statement Pt reports 70% overall Rt shoulder pain since the start of care.  Rt hip pain is intermittent and 2/10 when it occurs. Pt with improved scapular position with pulleys this week.  Pt is able to tolerate increased weight training with Rt UE without limitation.  Pt will continue to beneift from skilled PT for strength progression for hip and shoulder and pain management as needed.     Pt will benefit from skilled therapeutic intervention in order to improve on the following deficits Pain;Decreased strength;Decreased mobility;Decreased activity tolerance;Decreased endurance;Decreased range of motion;Difficulty walking;Increased fascial restricitons;Increased muscle spasms;Abnormal gait   Rehab Potential Excellent   Clinical Impairments Affecting Rehab Potential history of cervical cancer   PT Frequency 2x / week   PT Duration 8 weeks   PT Treatment/Interventions Iontophoresis 4mg /ml Dexamethasone;Moist Heat;Therapeutic exercise;Therapeutic activities;Electrical Stimulation;Gait training;Manual techniques;Patient/family education;Neuromuscular re-education;Passive range of motion;Dry needling   PT Next Visit Plan  Rt shoulder AROM and strength as tolertated,        Problem List Patient Active Problem List   Diagnosis Date Noted  . Diarrhea 08/13/2011  . Plantar fasciitis 10/20/2010  . BACK PAIN 08/26/2010  . MYALGIA 01/09/2010  . DIZZINESS 01/02/2010  . UNSPECIFIED VITAMIN D DEFICIENCY 09/04/2009  . GERD 09/04/2009  . CHEST PAIN UNSPECIFIED 12/10/2008  . FATIGUE 08/29/2008  . RESTLESS LEGS SYNDROME 09/28/2007  . INSOMNIA 09/28/2007  . PALPITATIONS 09/28/2007  . Hypothyroidism 08/10/2007  . Hyperlipidemia 08/10/2007  . ANXIETY 08/10/2007  . Essential hypertension 08/10/2007  . ASTHMA 08/10/2007    Sigurd Sos, PT 09/11/2015 12:20 PM  [facility] [address], [place], [place], [address] Phone: [phone]   Fax:  [phone]  Name: Hannah Morrow MRN: [id number] Date of Birth: Dec 17, 1940

## 2015-09-11 NOTE — Patient Instructions (Signed)

## 2015-09-16 ENCOUNTER — Ambulatory Visit: Payer: Medicare Other

## 2015-09-16 DIAGNOSIS — M25511 Pain in right shoulder: Secondary | ICD-10-CM

## 2015-09-16 DIAGNOSIS — R29898 Other symptoms and signs involving the musculoskeletal system: Secondary | ICD-10-CM | POA: Diagnosis not present

## 2015-09-16 DIAGNOSIS — M25551 Pain in right hip: Secondary | ICD-10-CM

## 2015-09-16 NOTE — Therapy (Signed)
[facility] [address] [place], [place], [address] Phone: [phone]   Fax:  [phone]  Physical Therapy Treatment  Patient Details  Name: Hannah Morrow MRN: [id number] Date of Birth: [birth year] Referring Provider: Dr. [name]  Encounter Date: 09/16/2015      PT End of Session - 09/16/15 1517    Visit Number 13   Number of Visits 20   Date for PT Re-Evaluation 09/29/15   Authorization Type Medicare/Tricare; Irven [place]; Kx on 15th visit   PT Start Time 1441   PT Stop Time 1525   PT Time Calculation (min) 44 min   Activity Tolerance Patient tolerated treatment well   Behavior During Therapy [facility] for tasks assessed/performed      Past Medical History  Diagnosis Date  . Hyperlipidemia   . Hypertension   . ANXIETY   . RESTLESS LEGS SYNDROME   . GERD   . Plantar fasciitis     right  . Hypothyroid   . Allergy     no meds  . Cancer [facility])     cervical cancer - hysterectomy    Past Surgical History  Procedure Laterality Date  . Abdominal hysterectomy    . Breast surgery      Bilateral breast reduction  . Wisdom tooth extraction    . Colostomy  04-22-2010    [name]  . [facility] bx      cervix    There were no vitals filed for this visit.  Visit Diagnosis:  Pain in joint of right shoulder  Shoulder weakness  Weakness of right hip  Hip pain, acute, right      Subjective Assessment - 09/16/15 1502    Subjective Hurting all over due to allergies today.  80-85% overall improvement since the start of care. 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     Currently in Pain? Yes   Pain Score 4    Pain Location Shoulder   Pain Orientation Right   Pain Descriptors / Indicators Aching   Pain Type Acute pain   Pain Onset More than a month ago   Pain Frequency Intermittent   Aggravating Factors  use Rt arm for endurance   Pain Relieving Factors not moving arm                          [facility] Adult PT Treatment/Exercise - 09/16/15 0001    Lumbar Exercises: Stretches   Active Hamstring Stretch 20 seconds;1 rep  on stairs each leg   Knee/Hip Exercises: Aerobic   Nustep Level 2 x 8 minutes  seat 7, arms 7   Shoulder Exercises: Seated   Flexion Strengthening;Right;20 reps;Weights   Flexion Weight (lbs) 1   Abduction Strengthening;Right;20 reps;Weights   ABduction Weight (lbs) 1   ABduction Limitations Scaption 1# 2x10   Shoulder Exercises: Standing   Flexion AAROM;Strengthening;Right;20 reps  using finger ladder   Shoulder Flexion Weight (lbs) 1   ABduction Strengthening;AAROM;Right;10 reps  using finger ladder   Shoulder ABduction Weight (lbs) 1  weight removed for 2nd set of 10   ABduction Limitations using finger ladder; therapist guides scapula   Other Standing Exercises Lat bar 20# 10 x 3   Shoulder Exercises: Pulleys   Flexion 3 minutes   ABduction 3 minutes   Shoulder Exercises: ROM/Strengthening   UBE (Upper Arm Bike) Level 1 x 6 minutes (3/3)  PT Education - 09/16/15 1455    Education provided Yes   Education Details 3 way raises shoulder   Person(s) Educated Patient   Methods Explanation;Handout;Demonstration   Comprehension Verbalized understanding;Returned demonstration          PT Short Term Goals - 09/02/15 1144    PT SHORT TERM GOAL #1   Title independent with initial HEP   Status Achieved   PT SHORT TERM GOAL #2   Title lay on right shoulder with pain decreased >/= 25%   Time 4   Period Weeks   Status Achieved   PT SHORT TERM GOAL #3   Title walk with >/= 25% greater pain in right hip   Status Achieved           PT Long Term Goals - 09/16/15 1448    PT LONG TERM GOAL #1   Title independent with HEP and understand how to progress herself   Time 8   Period Weeks   Status On-going   PT LONG TERM GOAL #2   Title walk and drive with no pain due to pelvis in  correct alignment and improve hip capsule mobility   Time 8   Period Weeks   PT LONG TERM GOAL #3   Title lay on right shoulder with minimal pain and does not wake up    Status Achieved   PT LONG TERM GOAL #4   Title right shoulder strength 4+/5 so she is able to do her household tasks   Time 8   Period Weeks   Status On-going   PT LONG TERM GOAL #5   Title drive without right hip pain due to right hip strength >/= 4+/5   Time 8   Period Weeks   Status On-going               Plan - 09/16/15 1456    Clinical Impression Statement Pt reports 80% to 85% overall improvement in Rt hip and shoulder symptoms since the start of care.  Pt reports fatigue and Rt shoulder pain with craft activity for prolonged periods.  PT emphasized strength and endurance progression today.  Pt denies any Rt shoulder pain at night and is able to lay on her Rt shoulder at night.  Pt will benefit from skilled PT for strength progression for hip and shoulder pain and pain management as needed.     Pt will benefit from skilled therapeutic intervention in order to improve on the following deficits Pain;Decreased strength;Decreased mobility;Decreased activity tolerance;Decreased endurance;Decreased range of motion;Difficulty walking;Increased fascial restricitons;Increased muscle spasms;Abnormal gait   Rehab Potential Excellent   Clinical Impairments Affecting Rehab Potential history of cervical cancer   PT Frequency 2x / week   PT Duration 8 weeks   PT Treatment/Interventions Iontophoresis 4mg /ml Dexamethasone;Moist Heat;Therapeutic exercise;Therapeutic activities;Electrical Stimulation;Gait training;Manual techniques;Patient/family education;Neuromuscular re-education;Passive range of motion;Dry needling   PT Next Visit Plan  Rt shoulder AROM and strength as tolertated,  Hip strength and flexibility as needed.   Consulted and Agree with Plan of Care Patient        Problem List Patient Active Problem List    Diagnosis Date Noted  . Diarrhea 08/13/2011  . Plantar fasciitis 10/20/2010  . BACK PAIN 08/26/2010  . MYALGIA 01/09/2010  . DIZZINESS 01/02/2010  . UNSPECIFIED VITAMIN D DEFICIENCY 09/04/2009  . GERD 09/04/2009  . CHEST PAIN UNSPECIFIED 12/10/2008  . FATIGUE 08/29/2008  . RESTLESS LEGS SYNDROME 09/28/2007  . INSOMNIA 09/28/2007  . PALPITATIONS 09/28/2007  . Hypothyroidism  08/10/2007  . Hyperlipidemia 08/10/2007  . ANXIETY 08/10/2007  . Essential hypertension 08/10/2007  . ASTHMA 08/10/2007   Sigurd Sos, PT 09/16/2015 3:20 PM  [facility] [address] [place], [place], [address] Phone: [phone]   Fax:  [phone]  Name: Hannah Morrow MRN: [id number] Date of Birth: 07-19-1940

## 2015-09-16 NOTE — Patient Instructions (Signed)
SHOULDER: Flexion Unilateral (Weight)   Start with arm at side. Raise both arms forward and up to shoulder high  Keep elbows straight.  Use   lb weight.10  reps per set, 2___ sets per day.  Copyright  VHI. All rights reserved.  SHOULDER: Abduction (Weight)   Raise arm out and up. Keep elbow straight. Do not shrug shoulders. Use  # lb weight. _10__ reps per set,2__ sets per day.  Copyright  VHI. All rights reserved.  SHOULDER: Scaption (Weight)   Place arm at 45 angle to body. Raise arm up to shoulder level - shoulder keeping elbow straight.  Use   lb weight. _10__ reps per set, _2__ sets per day.    [place] [address] [place], [place] [address] Phone # [phone] Fax [phone]

## 2015-09-17 DIAGNOSIS — H2513 Age-related nuclear cataract, bilateral: Secondary | ICD-10-CM | POA: Diagnosis not present

## 2015-09-17 DIAGNOSIS — H35373 Puckering of macula, bilateral: Secondary | ICD-10-CM | POA: Diagnosis not present

## 2015-09-23 ENCOUNTER — Ambulatory Visit: Payer: Medicare Other

## 2015-09-23 DIAGNOSIS — M25511 Pain in right shoulder: Secondary | ICD-10-CM | POA: Diagnosis not present

## 2015-09-23 DIAGNOSIS — M25551 Pain in right hip: Secondary | ICD-10-CM | POA: Diagnosis not present

## 2015-09-23 DIAGNOSIS — R29898 Other symptoms and signs involving the musculoskeletal system: Secondary | ICD-10-CM

## 2015-09-23 NOTE — Therapy (Signed)
[facility] [address], [place], [place], [address] Phone: [phone]   Fax:  [phone]  Physical Therapy Treatment  Patient Details  Name: Hannah Morrow MRN: [id number] Date of Birth: Nov 18, 1940 Referring Provider: Dr. [name]  Encounter Date: 09/23/2015      PT End of Session - 09/23/15 1436    Visit Number 14   Number of Visits 20   Date for PT Re-Evaluation 09/29/15   Authorization Type Medicare/Tricare; Irven [place]; Kx on 15th visit   PT Start Time 1402   PT Stop Time 1443   PT Time Calculation (min) 41 min   Activity Tolerance Patient tolerated treatment well   Behavior During Therapy [facility] for tasks assessed/performed      Past Medical History  Diagnosis Date  . Hyperlipidemia   . Hypertension   . ANXIETY   . RESTLESS LEGS SYNDROME   . GERD   . Plantar fasciitis     right  . Hypothyroid   . Allergy     no meds  . Cancer [facility])     cervical cancer - hysterectomy    Past Surgical History  Procedure Laterality Date  . Abdominal hysterectomy    . Breast surgery      Bilateral breast reduction  . Wisdom tooth extraction    . Colostomy  04-22-2010    [name]  . [facility] bx      cervix    There were no vitals filed for this visit.  Visit Diagnosis:  Pain in joint of right shoulder  Shoulder weakness  Weakness of right hip  Hip pain, acute, right      Subjective Assessment - 09/23/15 1405    Subjective Pt reports that she strained her Lt side of her back when doing shoulder exercises on Sunday.  Feels like a pulled muscle.     Currently in Pain? Yes   Pain Score 2    Pain Location Shoulder   Pain Orientation Right   Pain Descriptors / Indicators Aching   Pain Type Chronic pain   Pain Onset More than a month ago   Pain Frequency Intermittent   Aggravating Factors  use of Rt UE for endurance   Pain Relieving Factors not using Rt arm as much                          [facility] Adult PT Treatment/Exercise - 09/23/15 0001    Lumbar Exercises: Stretches   Active Hamstring Stretch 20 seconds;1 rep  on stairs each leg   Single Knee to Chest Stretch 3 reps;20 seconds  diagonal   Lower Trunk Rotation 3 reps;20 seconds  to promote thoacic and lumbar mobility   Knee/Hip Exercises: Aerobic   Nustep Level 2 x 8 minutes  seat 7, arms 7   Shoulder Exercises: Seated   Flexion Strengthening;Right;20 reps;Weights   Flexion Weight (lbs) 1   Abduction Strengthening;Right;20 reps;Weights   ABduction Weight (lbs) 1   ABduction Limitations Scaption 1# 2x10   Other Seated Exercises seated thoacolumbar rotation3x20 seconds bil.    Shoulder Exercises: Pulleys   Flexion 3 minutes   ABduction 3 minutes   Shoulder Exercises: ROM/Strengthening   UBE (Upper Arm Bike) --                  PT Short Term Goals - 09/02/15 1144    PT SHORT TERM GOAL #1   Title independent with initial HEP  Status Achieved   PT SHORT TERM GOAL #2   Title lay on right shoulder with pain decreased >/= 25%   Time 4   Period Weeks   Status Achieved   PT SHORT TERM GOAL #3   Title walk with >/= 25% greater pain in right hip   Status Achieved           PT Long Term Goals - 09/23/15 1406    PT LONG TERM GOAL #1   Title independent with HEP and understand how to progress herself   Time 8   Period Weeks   Status On-going   PT LONG TERM GOAL #2   Title walk and drive with no pain due to pelvis in correct alignment and improve hip capsule mobility   Status Achieved   PT LONG TERM GOAL #3   Title lay on right shoulder with minimal pain and does not wake up    Status Achieved               Plan - 09/23/15 1407    Clinical Impression Statement Pt reports 80-85% overall improvement in Rt hip and shoulder symptoms since the start of care.  Pt reports fatigue and Rt shoulder pain with craft activity for prolonged periods. Pt with Lt  thoracic/lumbar pain over the past few days due to strain with one of her exercises. More time spent in treatment for thoraco/lumbar flexibility.  Pt is performing HEP for flexilibity and strength progression.  Pt will benefit from skilled PT for flexibility, strength and endurance progression for Rt hip and shoulder.     Pt will benefit from skilled therapeutic intervention in order to improve on the following deficits Pain;Decreased strength;Decreased mobility;Decreased activity tolerance;Decreased endurance;Decreased range of motion;Difficulty walking;Increased fascial restricitons;Increased muscle spasms;Abnormal gait   Rehab Potential Excellent   Clinical Impairments Affecting Rehab Potential history of cervical cancer   PT Frequency 2x / week   PT Duration 8 weeks   PT Treatment/Interventions Iontophoresis 4mg /ml Dexamethasone;Moist Heat;Therapeutic exercise;Therapeutic activities;Electrical Stimulation;Gait training;Manual techniques;Patient/family education;Neuromuscular re-education;Passive range of motion;Dry needling   PT Next Visit Plan  Rt shoulder AROM and strength as tolertated,  Hip strength and flexibility as needed.   Consulted and Agree with Plan of Care Patient        Problem List Patient Active Problem List   Diagnosis Date Noted  . Diarrhea 08/13/2011  . Plantar fasciitis 10/20/2010  . BACK PAIN 08/26/2010  . MYALGIA 01/09/2010  . DIZZINESS 01/02/2010  . UNSPECIFIED VITAMIN D DEFICIENCY 09/04/2009  . GERD 09/04/2009  . CHEST PAIN UNSPECIFIED 12/10/2008  . FATIGUE 08/29/2008  . RESTLESS LEGS SYNDROME 09/28/2007  . INSOMNIA 09/28/2007  . PALPITATIONS 09/28/2007  . Hypothyroidism 08/10/2007  . Hyperlipidemia 08/10/2007  . ANXIETY 08/10/2007  . Essential hypertension 08/10/2007  . ASTHMA 08/10/2007    Sigurd Sos, PT 09/23/2015 2:38 PM  [facility] [address] [place], [place], [address] Phone:  [phone]   Fax:  [phone]  Name: LANIQUE WHITCHER MRN: [id number] Date of Birth: Oct 05, 1940

## 2015-09-25 ENCOUNTER — Ambulatory Visit: Payer: Medicare Other

## 2015-09-25 ENCOUNTER — Telehealth: Payer: Self-pay

## 2015-09-25 NOTE — Telephone Encounter (Signed)
PT called pt due to no-show appointment.  Pt forgot her appointment and will come to next one.

## 2015-09-29 ENCOUNTER — Ambulatory Visit (INDEPENDENT_AMBULATORY_CARE_PROVIDER_SITE_OTHER): Payer: Medicare Other | Admitting: Internal Medicine

## 2015-09-29 ENCOUNTER — Ambulatory Visit: Payer: Medicare Other

## 2015-09-29 ENCOUNTER — Encounter: Payer: Self-pay | Admitting: Internal Medicine

## 2015-09-29 VITALS — BP 130/80 | HR 88 | Temp 98.2°F | Wt 204.0 lb

## 2015-09-29 DIAGNOSIS — L089 Local infection of the skin and subcutaneous tissue, unspecified: Secondary | ICD-10-CM | POA: Diagnosis not present

## 2015-09-29 DIAGNOSIS — M25551 Pain in right hip: Secondary | ICD-10-CM

## 2015-09-29 DIAGNOSIS — M25511 Pain in right shoulder: Secondary | ICD-10-CM

## 2015-09-29 DIAGNOSIS — R29898 Other symptoms and signs involving the musculoskeletal system: Secondary | ICD-10-CM | POA: Diagnosis not present

## 2015-09-29 NOTE — Patient Instructions (Signed)
This could be a local skin infection or a reaction to  Med or body illness   Warm compresses    And if not improving in the next 2 days  add antibiotic   Doxycycline  ok that  You have at home .

## 2015-09-29 NOTE — Progress Notes (Signed)
Pre visit review using our clinic review tool, if applicable. No additional management support is needed unless otherwise documented below in the visit note. 

## 2015-09-29 NOTE — Therapy (Signed)
[facility] [address] [place], [place], [address] Phone: [phone]   Fax:  [phone]  Physical Therapy Treatment  Patient Details  Name: Hannah Morrow MRN: [id number] Date of Birth: [birth year] Referring Provider: Dr. [name]  Encounter Date: 09/29/2015      PT End of Session - 09/29/15 1129    Visit Number 15   PT Start Time 7262   PT Stop Time 1139   PT Time Calculation (min) 43 min   Activity Tolerance Patient tolerated treatment well   Behavior During Therapy [facility] for tasks assessed/performed      Past Medical History  Diagnosis Date  . Hyperlipidemia   . Hypertension   . ANXIETY   . RESTLESS LEGS SYNDROME   . GERD   . Plantar fasciitis     right  . Hypothyroid   . Allergy     no meds  . Cancer [facility])     cervical cancer - hysterectomy    Past Surgical History  Procedure Laterality Date  . Abdominal hysterectomy    . Breast surgery      Bilateral breast reduction  . Wisdom tooth extraction    . Colostomy  04-22-2010    [name]  . [facility] bx      cervix    There were no vitals filed for this visit.  Visit Diagnosis:  Pain in joint of right shoulder  Shoulder weakness  Weakness of right hip  Hip pain, acute, right      Subjective Assessment - 09/29/15 1103    Subjective Pt is ready for D/C.  No hip pain.  Rt shoulder pain is intermittent.  85-90% improvement overall.     Currently in Pain? Yes   Pain Score 2    Pain Orientation Right   Pain Descriptors / Indicators Aching   Pain Type Chronic pain   Pain Onset More than a month ago   Pain Frequency Intermittent   Aggravating Factors  use of Rt UE for endurance tasks   Pain Relieving Factors resting the arm   Pain Score 0   Pain Location Hip   Pain Orientation Right            [facility] PT Assessment - 09/29/15 0001    Assessment   Medical Diagnosis M25.511 right shoulder pain; M25.551 right hip pain   Onset  Date/Surgical Date 05/06/15   Hand Dominance Right   Prior Therapy None   Precautions   Precautions Other (comment)   Precaution Comments cancer precautions   Observation/Other Assessments   Focus on Therapeutic Outcomes (FOTO)  27% limitation   AROM   Right Shoulder Flexion 160 Degrees   Right Shoulder ABduction 165 Degrees   Strength   Right Shoulder Flexion 4+/5   Right Shoulder ABduction 4/5   Right Shoulder Internal Rotation 5/5   Right Shoulder External Rotation 4+/5   Right Hip Flexion 5/5   Right Hip ABduction 5/5                     [facility] Adult PT Treatment/Exercise - 09/29/15 0001    Knee/Hip Exercises: Aerobic   Nustep Level 2 x 8 minutes  seat 7, arms 7   Shoulder Exercises: Seated   Flexion Strengthening;Right;20 reps;Weights   Flexion Weight (lbs) 1   Abduction Strengthening;Right;20 reps;Weights   ABduction Weight (lbs) 1   ABduction Limitations Scaption 1# 2x10   Other Seated Exercises seated thoacolumbar rotation3x20 seconds bil.  Shoulder Exercises: Standing   Flexion AAROM;Strengthening;Right;20 reps  using finger ladder   Shoulder Flexion Weight (lbs) 1   ABduction Strengthening;AAROM;Right;10 reps  using finger ladder   Shoulder ABduction Weight (lbs) 1  weight removed for 2nd set of 10   ABduction Limitations using finger ladder   Shoulder Exercises: Pulleys   Flexion 3 minutes   ABduction 3 minutes   Shoulder Exercises: ROM/Strengthening   UBE (Upper Arm Bike) Level 1 x 6 minutes (3/3)                  PT Short Term Goals - 2015/10/14 1106    PT SHORT TERM GOAL #1   Title independent with initial HEP   Status Achieved   PT SHORT TERM GOAL #2   Title lay on right shoulder with pain decreased >/= 25%   Status Achieved   PT SHORT TERM GOAL #3   Title walk with >/= 25% greater pain in right hip   Status Achieved           PT Long Term Goals - 2015-10-14 1106    PT LONG TERM GOAL #1   Title independent with HEP  and understand how to progress herself   Status Achieved   PT LONG TERM GOAL #2   Title walk and drive with no pain due to pelvis in correct alignment and improve hip capsule mobility   Status Achieved   PT LONG TERM GOAL #3   Title lay on right shoulder with minimal pain and does not wake up    Status Achieved   PT LONG TERM GOAL #4   Title right shoulder strength 4+/5 so she is able to do her household tasks   Status Partially Met   PT LONG TERM GOAL #5   Title drive without right hip pain due to right hip strength >/= 4+/5   Status Achieved               Plan - 2015-10-14 1113    Clinical Impression Statement PT is 80-90% overall improved.  Pt with improved Rt shoulder AROM and strength and Rt hip strength.  Pt has HEP in place and will continue with HEP for continued goals.  Pt is able to sleep on Rt shoulder without waking with pain.  Pt will D/C today.     PT Next Visit Plan D/C PT to HEP   Consulted and Agree with Plan of Care Patient          G-Codes - Oct 14, 2015 1104    Functional Assessment Tool Used FOTO: 27% limitation   Functional Limitation Other PT primary   Other PT Primary Goal Status (O1308) At least 20 percent but less than 40 percent impaired, limited or restricted   Other PT Primary Discharge Status (M5784) At least 20 percent but less than 40 percent impaired, limited or restricted      Problem List Patient Active Problem List   Diagnosis Date Noted  . Diarrhea 08/13/2011  . Plantar fasciitis 10/20/2010  . BACK PAIN 08/26/2010  . MYALGIA 01/09/2010  . DIZZINESS 01/02/2010  . UNSPECIFIED VITAMIN D DEFICIENCY 09/04/2009  . GERD 09/04/2009  . CHEST PAIN UNSPECIFIED 12/10/2008  . FATIGUE 08/29/2008  . RESTLESS LEGS SYNDROME 09/28/2007  . INSOMNIA 09/28/2007  . PALPITATIONS 09/28/2007  . Hypothyroidism 08/10/2007  . Hyperlipidemia 08/10/2007  . ANXIETY 08/10/2007  . Essential hypertension 08/10/2007  . ASTHMA 08/10/2007   PHYSICAL THERAPY  DISCHARGE SUMMARY  Visits from Start of Care:  15  Current functional level related to goals / functional outcomes: See above for current status.     Remaining deficits: Mild and intermittent Rt shoulder pain with use. Pt has HEP in place for continued gains.     Education / Equipment: HEP, Economist education Plan: Patient agrees to discharge.  Patient goals were met. Patient is being discharged due to meeting the stated rehab goals.  ?????     Sigurd Sos, PT 09/29/2015 11:34 AM  [facility] [address] [place], [place], [address] Phone: [phone]   Fax:  [phone]  Name: Hannah Morrow MRN: [id number] Date of Birth: 28-Oct-1940

## 2015-09-29 NOTE — Progress Notes (Signed)
Chief Complaint  Patient presents with  . check spot on left leg    area warm to the touch, first noticed last week and it looked like a bruise    HPI: ANTA CHENOWETH 75 y.o.  Patient DAVE ARTERS  comes in today for SDA for  new problem evaluation. PCP NA Had last PT  Today   Noted red spot left leg  And pt said get checked  Onset 1 week and onset like a bruise  And then red  Area  And pt and husband   get checked.    No itch tender at initial  And  Still tender  .    Has rx for doxycat home not used  No streaking   Remembered trauma  Feels ok otherwies  ROS: See pertinent positives and negatives per HPI. No fever  Easy bruising   Past Medical History  Diagnosis Date  . Hyperlipidemia   . Hypertension   . ANXIETY   . RESTLESS LEGS SYNDROME   . GERD   . Plantar fasciitis     right  . Hypothyroid   . Allergy     no meds  . Cancer [facility])     cervical cancer - hysterectomy    Family History  Problem Relation Age of Onset  . Cancer Sister     melanoma- with mets  . Colon cancer Father 19  . Stomach cancer Maternal Grandfather   . Rectal cancer Neg Hx   . Esophageal cancer Neg Hx     Social History   Social History  . Marital Status: Married    Spouse Name: N/A  . Number of Children: 2  . Years of Education: N/A   Social History Main Topics  . Smoking status: Never Smoker   . Smokeless tobacco: Never Used  . Alcohol Use: 3.0 oz/week    5 Glasses of wine per week     Comment: Occasional  . Drug Use: No  . Sexual Activity:    Partners: Male    Birth Control/ Protection: Surgical, Other-see comments     Comment: hysterectomy   Other Topics Concern  . None   Social History Narrative   Married for 2 years   Two daughters, five grand children and 5 great grand children. Daughter lives in [place]. Other daughter lives in [place].    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    Exercise Does not  exercise. Was going to exercise class but has since quite.    Diet: Veggies, chicken, salmon. Very little red meat. Eats lots of eggs.    Exercise --- walking , gym 3 classes a week    Outpatient Prescriptions Prior to Visit  Medication Sig Dispense Refill  . Ascorbic Acid (VITAMIN C) 1000 MG tablet Take 1,000 mg by mouth daily.    . calcium carbonate (TUMS - DOSED IN MG ELEMENTAL CALCIUM) 500 MG chewable tablet Chew 1 tablet by mouth daily.    . Cholecalciferol (VITAMIN D) 2000 UNITS CAPS Take by mouth.    Marland Kitchen ibuprofen (ADVIL,MOTRIN) 600 MG tablet Take 600 mg by mouth at bedtime as needed.      Marland Kitchen levothyroxine (SYNTHROID, LEVOTHROID) 112 MCG tablet TAKE 1 TABLET DAILY 90 tablet 0  . omeprazole (PRILOSEC) 20 MG capsule TAKE 1 CAPSULE DAILY 90 capsule 2  . simvastatin (ZOCOR) 40 MG tablet TAKE 1 TABLET AT BEDTIME 90  tablet 1  . tiZANidine (ZANAFLEX) 4 MG tablet Take 1 tablet (4 mg total) by mouth every 6 (six) hours as needed for muscle spasms. 30 tablet 1  . valsartan (DIOVAN) 320 MG tablet TAKE 1 TABLET DAILY 90 tablet 0  . doxycycline (VIBRAMYCIN) 100 MG capsule Take 1 capsule (100 mg total) by mouth 2 (two) times daily. (Patient not taking: Reported on 09/29/2015) 20 capsule 0  . traMADol (ULTRAM) 50 MG tablet Take 1 tablet (50 mg total) by mouth every 8 (eight) hours as needed. (Patient not taking: Reported on 09/29/2015) 30 tablet 0  . methylPREDNISolone (MEDROL DOSEPAK) 4 MG TBPK tablet Take as directed (Patient not taking: Reported on 09/29/2015) 21 tablet 0  . ranitidine (ZANTAC) 150 MG capsule Take 150 mg by mouth as needed for heartburn. Reported on 09/29/2015     No facility-administered medications prior to visit.     EXAM:  BP 130/80 mmHg  Pulse 88  Temp(Src) 98.2 F (36.8 C) (Oral)  Wt 204 lb (92.534 kg)  Body mass index is 37.3 kg/(m^2).  GENERAL: vitals reviewed and listed above, alert, oriented, appears well hydrated and in no acute distress HEENT: atraumatic,  conjunctiva  clear, no obvious abnormalities on inspection of external nose and earsMS: moves all extremities without noticeable focal  Abnormality lef le with latera anterio a 2.5-3 cm redness  With  Central induration and no fluctuance  Min tender and no streak or bruise. No central bit mark   Gait  unlabored  PSYCH: pleasant and cooperative, no obvious depression or anxiety  ASSESSMENT AND PLAN:  Discussed the following assessment and plan:  Skin inflammation - poss inection vs other see text   Single red skin nodule  Consider  Infection  Post bite  Panniculitis poss with hx of bruise onset  Doubt drug reaction   observe add antibiotic if not improving or spreading and then fu  Prn discussion  -Patient advised to return or notify health care team  if symptoms worsen ,persist or new concerns arise.  Patient Instructions  This could be a local skin infection or a reaction to  Med or body illness   Warm compresses    And if not improving in the next 2 days  add antibiotic   Doxycycline  ok that  You have at home .       Standley Brooking. Mcgwire Dasaro M.D.

## 2015-10-10 ENCOUNTER — Other Ambulatory Visit: Payer: Self-pay | Admitting: Adult Health

## 2015-11-24 ENCOUNTER — Other Ambulatory Visit: Payer: Self-pay

## 2015-11-24 DIAGNOSIS — Z1231 Encounter for screening mammogram for malignant neoplasm of breast: Secondary | ICD-10-CM

## 2015-11-24 DIAGNOSIS — Z9889 Other specified postprocedural states: Secondary | ICD-10-CM

## 2016-01-01 ENCOUNTER — Ambulatory Visit
Admission: RE | Admit: 2016-01-01 | Discharge: 2016-01-01 | Disposition: A | Discharge status: Home / Self Care | Payer: Medicare Other | Source: Ambulatory Visit

## 2016-01-01 DIAGNOSIS — Z1231 Encounter for screening mammogram for malignant neoplasm of breast: Secondary | ICD-10-CM

## 2016-01-01 DIAGNOSIS — Z9889 Other specified postprocedural states: Secondary | ICD-10-CM

## 2016-01-09 ENCOUNTER — Other Ambulatory Visit: Payer: Self-pay

## 2016-01-09 MED ORDER — LEVOTHYROXINE SODIUM 112 MCG PO TABS: 112.0000 ug | ORAL_TABLET | Freq: Every day | ORAL | Status: DC

## 2016-01-09 MED ORDER — VALSARTAN 320 MG PO TABS: 320.0000 mg | ORAL_TABLET | Freq: Every day | ORAL | Status: DC

## 2016-01-09 NOTE — Telephone Encounter (Signed)
I received a fax from [facility] - requesting L-thyroxine and Simvastatin. Ok to refill?

## 2016-01-09 NOTE — Telephone Encounter (Signed)
Ok to refill for 6 months 

## 2016-02-20 DIAGNOSIS — D1801 Hemangioma of skin and subcutaneous tissue: Secondary | ICD-10-CM | POA: Diagnosis not present

## 2016-02-20 DIAGNOSIS — L814 Other melanin hyperpigmentation: Secondary | ICD-10-CM | POA: Diagnosis not present

## 2016-02-20 DIAGNOSIS — L821 Other seborrheic keratosis: Secondary | ICD-10-CM | POA: Diagnosis not present

## 2016-02-20 DIAGNOSIS — L308 Other specified dermatitis: Secondary | ICD-10-CM | POA: Diagnosis not present

## 2016-02-20 DIAGNOSIS — D225 Melanocytic nevi of trunk: Secondary | ICD-10-CM | POA: Diagnosis not present

## 2016-02-20 DIAGNOSIS — D2272 Melanocytic nevi of left lower limb, including hip: Secondary | ICD-10-CM | POA: Diagnosis not present

## 2016-02-20 DIAGNOSIS — L820 Inflamed seborrheic keratosis: Secondary | ICD-10-CM | POA: Diagnosis not present

## 2016-02-20 DIAGNOSIS — Z85828 Personal history of other malignant neoplasm of skin: Secondary | ICD-10-CM | POA: Diagnosis not present

## 2016-03-10 ENCOUNTER — Other Ambulatory Visit: Payer: Self-pay | Admitting: Adult Health

## 2016-03-10 NOTE — Telephone Encounter (Signed)
Ok to refill 90 +3 

## 2016-05-14 ENCOUNTER — Ambulatory Visit (INDEPENDENT_AMBULATORY_CARE_PROVIDER_SITE_OTHER): Payer: Medicare Other

## 2016-05-14 DIAGNOSIS — Z23 Encounter for immunization: Secondary | ICD-10-CM | POA: Diagnosis not present

## 2016-06-10 ENCOUNTER — Other Ambulatory Visit: Payer: Self-pay

## 2016-07-07 ENCOUNTER — Other Ambulatory Visit: Payer: Self-pay | Admitting: Adult Health

## 2016-07-13 ENCOUNTER — Ambulatory Visit (INDEPENDENT_AMBULATORY_CARE_PROVIDER_SITE_OTHER): Payer: Medicare Other | Admitting: Adult Health

## 2016-07-13 ENCOUNTER — Encounter: Payer: Self-pay | Admitting: Adult Health

## 2016-07-13 VITALS — BP 152/62 | Temp 98.6°F | Ht 62.0 in | Wt 204.8 lb

## 2016-07-13 DIAGNOSIS — K219 Gastro-esophageal reflux disease without esophagitis: Secondary | ICD-10-CM | POA: Diagnosis not present

## 2016-07-13 DIAGNOSIS — E039 Hypothyroidism, unspecified: Secondary | ICD-10-CM

## 2016-07-13 DIAGNOSIS — I10 Essential (primary) hypertension: Secondary | ICD-10-CM

## 2016-07-13 DIAGNOSIS — E785 Hyperlipidemia, unspecified: Secondary | ICD-10-CM | POA: Diagnosis not present

## 2016-07-13 LAB — LIPID PANEL
CHOLESTEROL: 205 mg/dL — AB (ref 0–200)
HDL: 52 mg/dL (ref >39.00)
NonHDL: 152.76
Total CHOL/HDL Ratio: 4
Triglycerides: 227 mg/dL — ABNORMAL HIGH (ref 0.0–149.0)
VLDL: 45.4 mg/dL — ABNORMAL HIGH (ref 0.0–40.0)

## 2016-07-13 LAB — BASIC METABOLIC PANEL
BUN: 20 mg/dL (ref 6–23)
CHLORIDE: 106 meq/L (ref 96–112)
CO2: 24 mEq/L (ref 19–32)
Calcium: 9.7 mg/dL (ref 8.4–10.5)
Creatinine, Ser: 1.05 mg/dL (ref 0.40–1.20)
GFR: 54.41 mL/min — AB (ref >60.00)
GLUCOSE: 118 mg/dL — AB (ref 70–99)
POTASSIUM: 4.2 meq/L (ref 3.5–5.1)
SODIUM: 140 meq/L (ref 135–145)

## 2016-07-13 LAB — HEPATIC FUNCTION PANEL
ALBUMIN: 4.4 g/dL (ref 3.5–5.2)
ALT: 33 U/L (ref 0–35)
AST: 29 U/L (ref 0–37)
Alkaline Phosphatase: 59 U/L (ref 39–117)
Bilirubin, Direct: 0.1 mg/dL (ref 0.0–0.3)
Total Bilirubin: 0.7 mg/dL (ref 0.2–1.2)
Total Protein: 7.4 g/dL (ref 6.0–8.3)

## 2016-07-13 LAB — CBC WITH DIFFERENTIAL/PLATELET
BASOS PCT: 1 % (ref 0.0–3.0)
Basophils Absolute: 0.1 10*3/uL (ref 0.0–0.1)
Eosinophils Absolute: 0.2 10*3/uL (ref 0.0–0.7)
Eosinophils Relative: 3.8 % (ref 0.0–5.0)
HCT: 42.4 % (ref 36.0–46.0)
HEMOGLOBIN: 14.1 g/dL (ref 12.0–15.0)
LYMPHS ABS: 2.4 10*3/uL (ref 0.7–4.0)
Lymphocytes Relative: 38.7 % (ref 12.0–46.0)
MCHC: 33.3 g/dL (ref 30.0–36.0)
MCV: 75.1 fl — ABNORMAL LOW (ref 78.0–100.0)
MONOS PCT: 10.9 % (ref 3.0–12.0)
Monocytes Absolute: 0.7 10*3/uL (ref 0.1–1.0)
NEUTROS PCT: 45.6 % (ref 43.0–77.0)
Neutro Abs: 2.9 10*3/uL (ref 1.4–7.7)
Platelets: 251 10*3/uL (ref 150.0–400.0)
RBC: 5.65 Mil/uL — AB (ref 3.87–5.11)
RDW: 13.9 % (ref 11.5–15.5)
WBC: 6.3 10*3/uL (ref 4.0–10.5)

## 2016-07-13 LAB — POC URINALSYSI DIPSTICK (AUTOMATED)
Bilirubin, UA: NEGATIVE
Blood, UA: NEGATIVE
Glucose, UA: NEGATIVE
Ketones, UA: NEGATIVE
Nitrite, UA: NEGATIVE
PROTEIN UA: NEGATIVE
Spec Grav, UA: 1.01
UROBILINOGEN UA: 0.2
pH, UA: 5.5

## 2016-07-13 LAB — TSH: TSH: 0.58 u[IU]/mL (ref 0.35–4.50)

## 2016-07-13 LAB — LDL CHOLESTEROL, DIRECT: LDL DIRECT: 121 mg/dL

## 2016-07-13 MED ORDER — OMEPRAZOLE 20 MG PO CPDR: 20.0000 mg | DELAYED_RELEASE_CAPSULE | Freq: Every day | ORAL | 2 refills | Status: DC

## 2016-07-13 NOTE — Patient Instructions (Signed)
It was great seeing you today!  I will follow up with you regarding your labs. iF we need to change anything, I will let you know!  Please let me know if you need anything

## 2016-07-13 NOTE — Progress Notes (Signed)
Subjective:    Patient ID: Hannah Morrow, female    DOB: 1941-04-19, 76 y.o.   MRN: [id number]  HPI  76 year old female who presents to the office today for annual follow up exam due to history of  has a past medical history of Allergy; ANXIETY; Cancer [facility]); GERD; Hyperlipidemia; Hypertension; Hypothyroid; Plantar fasciitis; and RESTLESS LEGS SYNDROME.    All immunizations and health maintenance protocols were reviewed with the patient and needed orders were placed. She is up to date on her medications  Medication reconciliation,  past medical history, social history, problem list and allergies were reviewed in detail with the patient  Goals were established with regard to weight loss, exercise, and  diet in compliance with medications  End of life planning was discussed.  She takes Diovan 320 mg for hypertension. She has not taken her medication this morning due to fasting. She reports that her blood pressure is controlled at home.   She also takes Zocor 40 mg for hyperlipidemia.   She takes Prilosec 40 mg for GERD and feels as though this is controlled.   She takes synthroid 112 mcg, she feels as though her hypothyroidism is well controlled on this dose.   Review of Systems  Constitutional: Negative.   Eyes: Negative.   Respiratory: Negative.   Cardiovascular: Negative.   Gastrointestinal: Negative.   Endocrine: Negative.   Genitourinary: Negative.   Musculoskeletal: Negative.   Neurological: Negative.   Hematological: Negative.   Psychiatric/Behavioral: Negative.   All other systems reviewed and are negative.  Past Medical History:  Diagnosis Date  . Allergy    no meds  . ANXIETY   . Cancer [facility])    cervical cancer - hysterectomy  . GERD   . Hyperlipidemia   . Hypertension   . Hypothyroid   . Plantar fasciitis    right  . RESTLESS LEGS SYNDROME     Social History   Social History  . Marital status: Married    Spouse name: N/A  . Number of  children: 2  . Years of education: N/A   Occupational History  . Not on file.   Social History Main Topics  . Smoking status: Never Smoker  . Smokeless tobacco: Never Used  . Alcohol use 3.0 oz/week    5 Glasses of wine per week     Comment: Occasional  . Drug use: No  . Sexual activity: Yes    Partners: Male    Birth control/ protection: Surgical, Other-see comments     Comment: hysterectomy   Other Topics Concern  . Not on file   Social History Narrative   Married for 32 years   Two daughters, five grand children and 5 great grand children. Daughter lives in [place]. Other daughter lives in [place].    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    Exercise Does not exercise. Was going to exercise class but has since quite.    Diet: Veggies, chicken, salmon. Very little red meat. Eats lots of eggs.    Exercise --- walking , gym 3 classes a week    Past Surgical History:  Procedure Laterality Date  . ABDOMINAL HYSTERECTOMY    . BREAST SURGERY     Bilateral breast reduction  . COLOSTOMY  04-22-2010   [name]  . cone bx     cervix  . WISDOM TOOTH EXTRACTION  Family History  Problem Relation Age of Onset  . Cancer Sister     melanoma- with mets  . Colon cancer Father 36  . Stomach cancer Maternal Grandfather   . Rectal cancer Neg Hx   . Esophageal cancer Neg Hx     Allergies  Allergen Reactions  . Hydrocodone-Acetaminophen Swelling    rash  . Penicillins Other (See Comments)    SOB, red face  . Vicodin [Hydrocodone-Acetaminophen]     Red face, pt doesn't remember other symptoms  . Hydrochlorothiazide Rash  . Lisinopril Rash    Current Outpatient Prescriptions on File Prior to Visit  Medication Sig Dispense Refill  . Ascorbic Acid (VITAMIN C) 1000 MG tablet Take 1,000 mg by mouth daily.    . calcium carbonate (TUMS - DOSED IN MG ELEMENTAL CALCIUM) 500 MG chewable tablet Chew 1 tablet by mouth daily.    .  Cholecalciferol (VITAMIN D) 2000 UNITS CAPS Take by mouth.    Marland Kitchen ibuprofen (ADVIL,MOTRIN) 600 MG tablet Take 600 mg by mouth at bedtime as needed.      Marland Kitchen levothyroxine (SYNTHROID, LEVOTHROID) 112 MCG tablet TAKE 1 TABLET DAILY 90 tablet 1  . simvastatin (ZOCOR) 40 MG tablet TAKE 1 TABLET AT BEDTIME 90 tablet 3  . valsartan (DIOVAN) 320 MG tablet TAKE 1 TABLET DAILY 90 tablet 1   No current facility-administered medications on file prior to visit.     BP (!) 152/62   Temp 98.6 F (37 C) (Oral)   Ht 5\' 2"  (1.575 m)   Wt 204 lb 12.8 oz (92.9 kg)   BMI 37.46 kg/m       Objective:   Physical Exam  Constitutional: She is oriented to person, place, and time. She appears well-developed and well-nourished. No distress.  HENT:  Head: Normocephalic and atraumatic.  Right Ear: External ear normal.  Left Ear: External ear normal.  Nose: Nose normal.  Mouth/Throat: Oropharynx is clear and moist. No oropharyngeal exudate.  Eyes: Conjunctivae and EOM are normal. Pupils are equal, round, and reactive to light. Right eye exhibits no discharge. Left eye exhibits no discharge.  Neck: Normal range of motion. Neck supple. No thyromegaly present.  Cardiovascular: Normal rate, regular rhythm, normal heart sounds and intact distal pulses.  Exam reveals no gallop and no friction rub.   No murmur heard. Pulmonary/Chest: Effort normal and breath sounds normal. No respiratory distress. She has no wheezes. She has no rales. She exhibits no tenderness.  Abdominal: Soft. Bowel sounds are normal. She exhibits no distension and no mass. There is no tenderness. There is no rebound and no guarding.  Musculoskeletal: Normal range of motion. She exhibits no edema, tenderness or deformity.  Lymphadenopathy:    She has no cervical adenopathy.  Neurological: She is alert and oriented to person, place, and time. She has normal reflexes. She displays normal reflexes. No cranial nerve deficit. She exhibits normal muscle  tone. Coordination normal.  Skin: Skin is warm and dry. No rash noted. She is not diaphoretic. No erythema. No pallor.  Psychiatric: She has a normal mood and affect. Her behavior is normal. Judgment and thought content normal.  Nursing note and vitals reviewed.     Assessment & Plan:  1. Hyperlipidemia, unspecified hyperlipidemia type - Basic metabolic panel - CBC with Differential/Platelet - Hepatic function panel - Lipid panel - TSH - POCT Urinalysis Dipstick (Automated) - needs to work on diet and exercise. Cut back on high fat foods.   2. Essential hypertension -  Not at goal in the office, she did not take her medication this morning  - Basic metabolic panel - CBC with Differential/Platelet - Hepatic function panel - Lipid panel - TSH - POCT Urinalysis Dipstick (Automated)  3. Gastroesophageal reflux disease, esophagitis presence not specified - omeprazole (PRILOSEC) 20 MG capsule; Take 1 capsule (20 mg total) by mouth daily.  Dispense: 90 capsule; Refill: 2  4. Hypothyroidism, unspecified type - Basic metabolic panel - CBC with Differential/Platelet - Hepatic function panel - Lipid panel - TSH - POCT Urinalysis Dipstick (Automated) - Consider changing synthroid level   [name], NP

## 2016-07-23 ENCOUNTER — Other Ambulatory Visit: Payer: Self-pay | Admitting: Adult Health

## 2016-07-23 ENCOUNTER — Telehealth: Payer: Self-pay

## 2016-07-23 MED ORDER — AMLODIPINE BESYLATE 10 MG PO TABS: 10.0000 mg | ORAL_TABLET | Freq: Every day | ORAL | 3 refills | Status: DC

## 2016-07-23 NOTE — Telephone Encounter (Signed)
I have sent in a prescription for Norvasc. Take this daily with Diovan   Please let me know what BP readings are over the course of two weeks

## 2016-07-23 NOTE — Telephone Encounter (Signed)
Spoke with pt and she states that she has been monitoring her blood pressure daily. Last reading was 161/116. She also states that she has been taking medications as directed. She denies headache, but states she "does not feel right" and it may be due to allergies. Pt would like to know recommendations.   Cory - Please advise. Thanks!   Patient Name: Hannah Morrow Gender: Female DOB: Jul 05, 1941 Age: 76 Y 28 M Return Phone Number: [phone] (Primary), [phone] (Secondary) Address: City/State/Zip: [place] Client [facility] - Client Client Site [place] - Day Physician [name] - NP Contact Type Call Who Is Calling Patient / Member / Family / Caregiver Call Type Triage / Clinical Relationship To Patient Self Return Phone Number [phone] (Primary) Chief Complaint Blood Pressure High Reason for Call Symptomatic / Request for Health Information Initial Comment caller states her BP was 161/116 yesterday Appointment Disposition EMR Caller Not Reached

## 2016-07-23 NOTE — Telephone Encounter (Signed)
Spoke with pt and advised. She will try to take BP daily, but new cuff is automated and she states that it hurts her. Advised pt to try and see if she can set a maximum pressure on her cuff. She will see what she can do. She will pick up rx and begin taking. Advised to call if symptoms/BP increase or worsen, and to call office in 2 weeks with BP readings. Nothing further needed at this time.

## 2016-08-06 ENCOUNTER — Telehealth: Payer: Self-pay | Admitting: Adult Health

## 2016-08-06 NOTE — Telephone Encounter (Signed)
Let see what her readings are next week

## 2016-08-06 NOTE — Telephone Encounter (Signed)
Pt was to report bp readings for 2 weeks, pt has the first week of readings 1/19  196/108 (day she called triage) 1/20  91/79  (was so low pt did not take pill for next 4 days) 1/21 143/85 1/22  141/81 1/23  140/89 1/25  164/81  1/28  (resumed extra bp med, did not take reading) 1/29   125/85 1/30  (did not take reading) 1/31  (did not take reading) 2/01  100/67  Pt wants to know if you want to see her after this next week of readings?

## 2016-08-06 NOTE — Telephone Encounter (Signed)
Patient notified of Cory's comments and verbalized understanding.  

## 2016-08-06 NOTE — Telephone Encounter (Signed)
See below and advise

## 2016-08-18 ENCOUNTER — Encounter: Payer: Self-pay | Admitting: Adult Health

## 2016-08-18 ENCOUNTER — Ambulatory Visit (INDEPENDENT_AMBULATORY_CARE_PROVIDER_SITE_OTHER): Payer: Medicare Other | Admitting: Adult Health

## 2016-08-18 VITALS — BP 134/70 | Temp 97.8°F | Ht 62.0 in | Wt 207.0 lb

## 2016-08-18 DIAGNOSIS — I10 Essential (primary) hypertension: Secondary | ICD-10-CM

## 2016-08-18 DIAGNOSIS — F329 Major depressive disorder, single episode, unspecified: Secondary | ICD-10-CM | POA: Diagnosis not present

## 2016-08-18 NOTE — Progress Notes (Signed)
Subjective:    Patient ID: Hannah Morrow, female    DOB: Oct 14, 1940, 76 y.o.   MRN: [id number]  HPI  This is a 76 year old female who  has a past medical history of Allergy; ANXIETY; Cancer [facility]); GERD; Hyperlipidemia; Hypertension; Hypothyroid; Plantar fasciitis; and RESTLESS LEGS SYNDROME. She presents with 2 weeks of vague complaints. Her complaints include feeling lightheaded, shaky, unsteady on her feet, flushing in her face and back pain. Her symptoms presented concurrently after starting Norvasc 10 mg for uncontrolled hypertension. Her blood pressure log after starting Norvasc her blood pressures range between 100/67 and 164/78 with the majority of them being between 120s and 140s.  She also reports that she cries uncontrollably multiple times throughout the day. She contributes this to depression after having to put her dog down. She feels guilt for this as she is worried that her dog they have lived a little bit longer. She reports that she does not get out of the house anymore and she is not enjoying things that she once did such as exercising.  She denies any blurred vision, headaches, chest pain, or shortness of breath   Review of Systems  Constitutional: Positive for activity change. Negative for chills and fever.  HENT: Negative.   Eyes: Negative.   Respiratory: Negative.   Cardiovascular: Negative.   Genitourinary: Negative.   Musculoskeletal: Positive for arthralgias and back pain.  Skin:       Flushing  Neurological: Positive for light-headedness. Negative for dizziness, syncope, speech difficulty, weakness and numbness.  Hematological: Negative.   Psychiatric/Behavioral: Negative for self-injury, sleep disturbance and suicidal ideas.       Depression   Past Medical History:  Diagnosis Date  . Allergy    no meds  . ANXIETY   . Cancer [facility])    cervical cancer - hysterectomy  . GERD   . Hyperlipidemia   . Hypertension   . Hypothyroid   . Plantar fasciitis      right  . RESTLESS LEGS SYNDROME     Social History   Social History  . Marital status: Married    Spouse name: N/A  . Number of children: 2  . Years of education: N/A   Occupational History  . Not on file.   Social History Main Topics  . Smoking status: Never Smoker  . Smokeless tobacco: Never Used  . Alcohol use 3.0 oz/week    5 Glasses of wine per week     Comment: Occasional  . Drug use: No  . Sexual activity: Yes    Partners: Male    Birth control/ protection: Surgical, Other-see comments     Comment: hysterectomy   Other Topics Concern  . Not on file   Social History Narrative   Married for 59 years   Two daughters, five grand children and 5 great grand children. Daughter lives in [place]. Other daughter lives in [place].    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    Exercise Does not exercise. Was going to exercise class but has since quite.    Diet: Veggies, chicken, salmon. Very little red meat. Eats lots of eggs.    Exercise --- walking , gym 3 classes a week    Past Surgical History:  Procedure Laterality Date  . ABDOMINAL HYSTERECTOMY    . BREAST SURGERY     Bilateral breast reduction  . COLOSTOMY  04-22-2010   [name]  .  cone bx     cervix  . WISDOM TOOTH EXTRACTION      Family History  Problem Relation Age of Onset  . Cancer Sister     melanoma- with mets  . Colon cancer Father 81  . Stomach cancer Maternal Grandfather   . Rectal cancer Neg Hx   . Esophageal cancer Neg Hx     Allergies  Allergen Reactions  . Hydrocodone-Acetaminophen Swelling    rash  . Penicillins Other (See Comments)    SOB, red face  . Vicodin [Hydrocodone-Acetaminophen]     Red face, pt doesn't remember other symptoms  . Hydrochlorothiazide Rash  . Lisinopril Rash    Current Outpatient Prescriptions on File Prior to Visit  Medication Sig Dispense Refill  . amLODipine (NORVASC) 10 MG tablet Take 1 tablet (10  mg total) by mouth daily. 30 tablet 3  . Ascorbic Acid (VITAMIN C) 1000 MG tablet Take 1,000 mg by mouth daily.    . calcium carbonate (TUMS - DOSED IN MG ELEMENTAL CALCIUM) 500 MG chewable tablet Chew 1 tablet by mouth daily.    . Cholecalciferol (VITAMIN D) 2000 UNITS CAPS Take by mouth.    Marland Kitchen ibuprofen (ADVIL,MOTRIN) 600 MG tablet Take 600 mg by mouth at bedtime as needed.      Marland Kitchen levothyroxine (SYNTHROID, LEVOTHROID) 112 MCG tablet TAKE 1 TABLET DAILY 90 tablet 1  . omeprazole (PRILOSEC) 20 MG capsule Take 1 capsule (20 mg total) by mouth daily. 90 capsule 2  . simvastatin (ZOCOR) 40 MG tablet TAKE 1 TABLET AT BEDTIME 90 tablet 3  . valsartan (DIOVAN) 320 MG tablet TAKE 1 TABLET DAILY 90 tablet 1   No current facility-administered medications on file prior to visit.     BP 134/70   Temp 97.8 F (36.6 C) (Oral)   Ht 5\' 2"  (1.575 m)   Wt 207 lb (93.9 kg)   BMI 37.86 kg/m       Objective:   Physical Exam  Constitutional: She is oriented to person, place, and time. She appears well-developed and well-nourished. No distress.  Neck: Normal range of motion. Neck supple.  Cardiovascular: Normal rate, regular rhythm, normal heart sounds and intact distal pulses.  Exam reveals no gallop and no friction rub.   No murmur heard. Pulmonary/Chest: Effort normal and breath sounds normal. No respiratory distress. She has no wheezes. She has no rales. She exhibits no tenderness.  Musculoskeletal: Normal range of motion. She exhibits no edema, tenderness or deformity.  Lymphadenopathy:    She has no cervical adenopathy.  Neurological: She is alert and oriented to person, place, and time. She has normal reflexes.  Skin: Skin is warm and dry. No rash noted. She is not diaphoretic. No erythema. No pallor.  Psychiatric: Judgment and thought content normal. She is withdrawn. She exhibits a depressed mood.  Tearful during exam  Nursing note and vitals reviewed.     Assessment & Plan:  1. Essential  hypertension -  A have her stop Norvasc 10 mg, as some of her symptoms could be a side effect of Norvasc,  monitor blood pressures carefully, and follow-up in one week. Return parameters were given to follow-up sooner than 1 week - She is unable to take ace inhibitors and is already on valsartan. Consider beta blocker in the future if  2. Reactive depression - Talked about starting antidepression medicine. She does not want to do this at this time. - She is looking and another dog, which I think would  be a good option for her. - Is to get outside and start walking again - Follow-up within 1 week  [name], NP

## 2016-08-25 ENCOUNTER — Encounter: Payer: Self-pay | Admitting: Adult Health

## 2016-08-25 ENCOUNTER — Ambulatory Visit (INDEPENDENT_AMBULATORY_CARE_PROVIDER_SITE_OTHER): Payer: Medicare Other | Admitting: Adult Health

## 2016-08-25 VITALS — BP 140/86 | Temp 97.7°F | Ht 62.0 in | Wt 208.6 lb

## 2016-08-25 DIAGNOSIS — I10 Essential (primary) hypertension: Secondary | ICD-10-CM

## 2016-08-25 NOTE — Progress Notes (Signed)
Subjective:    Patient ID: Hannah Morrow, female    DOB: 1941/06/08, 76 y.o.   MRN: [id number]  HPI  76 year old female who  has a past medical history of Allergy; ANXIETY; Cancer [facility]); GERD; Hyperlipidemia; Hypertension; Hypothyroid; Plantar fasciitis; and RESTLESS LEGS SYNDROME. She presents to the office today for one week follow up regarding hypertension. During the last visit we had stopped Norvasc due to side effects. She reports that her symptoms such as lightheaded, shaky, unsteady on her feet, flushing in her face and back pain.   She has been monitoring her blood pressures at home and they have been consistently in the [id number] systolic by just talking Valsartan   She also reports that she is crying less and feeling better about her self.   She would like to work on weight loss. She would like to lose 40 pounds. She is eating healthy and does practice portion control. She knows that her inactivity is keeping her from losing weight. She is ready to start walking again  Review of Systems See HPI  Past Medical History:  Diagnosis Date  . Allergy    no meds  . ANXIETY   . Cancer [facility])    cervical cancer - hysterectomy  . GERD   . Hyperlipidemia   . Hypertension   . Hypothyroid   . Plantar fasciitis    right  . RESTLESS LEGS SYNDROME     Social History   Social History  . Marital status: Married    Spouse name: N/A  . Number of children: 2  . Years of education: N/A   Occupational History  . Not on file.   Social History Main Topics  . Smoking status: Never Smoker  . Smokeless tobacco: Never Used  . Alcohol use 3.0 oz/week    5 Glasses of wine per week     Comment: Occasional  . Drug use: No  . Sexual activity: Yes    Partners: Male    Birth control/ protection: Surgical, Other-see comments     Comment: hysterectomy   Other Topics Concern  . Not on file   Social History Narrative   Married for 40 years   Two daughters, five grand children and 5  great grand children. Daughter lives in [place]. Other daughter lives in [place].    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    Exercise Does not exercise. Was going to exercise class but has since quite.    Diet: Veggies, chicken, salmon. Very little red meat. Eats lots of eggs.    Exercise --- walking , gym 3 classes a week    Past Surgical History:  Procedure Laterality Date  . ABDOMINAL HYSTERECTOMY    . BREAST SURGERY     Bilateral breast reduction  . COLOSTOMY  04-22-2010   [name]  . cone bx     cervix  . WISDOM TOOTH EXTRACTION      Family History  Problem Relation Age of Onset  . Cancer Sister     melanoma- with mets  . Colon cancer Father 6  . Stomach cancer Maternal Grandfather   . Rectal cancer Neg Hx   . Esophageal cancer Neg Hx     Allergies  Allergen Reactions  . Hydrocodone-Acetaminophen Swelling    rash  . Penicillins Other (See Comments)    SOB, red face  . Vicodin [Hydrocodone-Acetaminophen]  Red face, pt doesn't remember other symptoms  . Hydrochlorothiazide Rash  . Lisinopril Rash    Current Outpatient Prescriptions on File Prior to Visit  Medication Sig Dispense Refill  . Ascorbic Acid (VITAMIN C) 1000 MG tablet Take 1,000 mg by mouth daily.    . calcium carbonate (TUMS - DOSED IN MG ELEMENTAL CALCIUM) 500 MG chewable tablet Chew 1 tablet by mouth daily.    . Cholecalciferol (VITAMIN D) 2000 UNITS CAPS Take by mouth.    Marland Kitchen ibuprofen (ADVIL,MOTRIN) 600 MG tablet Take 600 mg by mouth at bedtime as needed.      Marland Kitchen levothyroxine (SYNTHROID, LEVOTHROID) 112 MCG tablet TAKE 1 TABLET DAILY 90 tablet 1  . omeprazole (PRILOSEC) 20 MG capsule Take 1 capsule (20 mg total) by mouth daily. 90 capsule 2  . simvastatin (ZOCOR) 40 MG tablet TAKE 1 TABLET AT BEDTIME 90 tablet 3  . valsartan (DIOVAN) 320 MG tablet TAKE 1 TABLET DAILY 90 tablet 1   No current facility-administered medications on file prior to  visit.     BP 140/86   Temp 97.7 F (36.5 C) (Oral)   Ht 5\' 2"  (1.575 m)   Wt 208 lb 9.6 oz (94.6 kg)   BMI 38.15 kg/m       Objective:   Physical Exam  Constitutional: She is oriented to person, place, and time. She appears well-developed and well-nourished. No distress.  Cardiovascular: Normal rate, regular rhythm, normal heart sounds and intact distal pulses.  Exam reveals no gallop and no friction rub.   No murmur heard. Pulmonary/Chest: Effort normal and breath sounds normal. No respiratory distress. She has no wheezes. She has no rales. She exhibits no tenderness.  Neurological: She is alert and oriented to person, place, and time.  Skin: Skin is warm and dry. No rash noted. She is not diaphoretic. No erythema. No pallor.  Psychiatric: She has a normal mood and affect. Her behavior is normal. Judgment and thought content normal.  Vitals reviewed.     Assessment & Plan:  1. Essential hypertension - She does not want to go on any additional medications at this time.  - I would like her to start walking daily.  - Monitor BP at home. Return precautions given   [name], NP

## 2016-09-16 DIAGNOSIS — H40033 Anatomical narrow angle, bilateral: Secondary | ICD-10-CM | POA: Diagnosis not present

## 2016-09-16 DIAGNOSIS — H35373 Puckering of macula, bilateral: Secondary | ICD-10-CM | POA: Diagnosis not present

## 2016-09-16 DIAGNOSIS — H2513 Age-related nuclear cataract, bilateral: Secondary | ICD-10-CM | POA: Diagnosis not present

## 2016-10-15 ENCOUNTER — Ambulatory Visit (INDEPENDENT_AMBULATORY_CARE_PROVIDER_SITE_OTHER): Payer: Medicare Other | Admitting: Adult Health

## 2016-10-15 VITALS — BP 152/90 | Temp 97.9°F | Wt 203.8 lb

## 2016-10-15 DIAGNOSIS — J069 Acute upper respiratory infection, unspecified: Secondary | ICD-10-CM | POA: Diagnosis not present

## 2016-10-15 DIAGNOSIS — R079 Chest pain, unspecified: Secondary | ICD-10-CM | POA: Diagnosis not present

## 2016-10-15 MED ORDER — PREDNISONE 10 MG PO TABS: ORAL_TABLET | ORAL | 0 refills | Status: DC

## 2016-10-15 NOTE — Progress Notes (Signed)
Subjective:    Patient ID: Hannah Morrow, female    DOB: [birth year], 76 y.o.   MRN: [id number]  URI   This is a new problem. The current episode started 1 to 4 weeks ago (2 weeks). There has been no fever. Associated symptoms include chest pain, congestion, coughing, headaches, rhinorrhea and sinus pain. Pertinent negatives include no nausea, plugged ear sensation or wheezing. She has tried decongestant for the symptoms. The treatment provided no relief.   This morning when she woke up she had a sudden onset of " stabbing" chest pain" that has come and gone throughout the morning. The pain is located in the middle of her chest and does not radiate.    Review of Systems  HENT: Positive for congestion, rhinorrhea and sinus pain.   Respiratory: Positive for cough. Negative for wheezing.   Cardiovascular: Positive for chest pain.  Gastrointestinal: Negative for nausea.  Neurological: Positive for headaches.   Past Medical History:  Diagnosis Date  . Allergy    no meds  . ANXIETY   . Cancer [facility])    cervical cancer - hysterectomy  . GERD   . Hyperlipidemia   . Hypertension   . Hypothyroid   . Plantar fasciitis    right  . RESTLESS LEGS SYNDROME     Social History   Social History  . Marital status: Married    Spouse name: N/A  . Number of children: 2  . Years of education: N/A   Occupational History  . Not on file.   Social History Main Topics  . Smoking status: Never Smoker  . Smokeless tobacco: Never Used  . Alcohol use 3.0 oz/week    5 Glasses of wine per week     Comment: Occasional  . Drug use: No  . Sexual activity: Yes    Partners: Male    Birth control/ protection: Surgical, Other-see comments     Comment: hysterectomy   Other Topics Concern  . Not on file   Social History Narrative   Married for 83 years   Two daughters, five grand children and 5 great grand children. Daughter lives in [place]. Other daughter lives in [place].    No longer working,  was an Secretary/administrator as LPN    Hobbies: paint, sew ( makes dolls clothes), read.    Exercise Does not exercise. Was going to exercise class but has since quite.    Diet: Veggies, chicken, salmon. Very little red meat. Eats lots of eggs.    Exercise --- walking , gym 3 classes a week    Past Surgical History:  Procedure Laterality Date  . ABDOMINAL HYSTERECTOMY    . BREAST SURGERY     Bilateral breast reduction  . COLOSTOMY  04-22-2010   [name]  . cone bx     cervix  . WISDOM TOOTH EXTRACTION      Family History  Problem Relation Age of Onset  . Cancer Sister     melanoma- with mets  . Colon cancer Father 3  . Stomach cancer Maternal Grandfather   . Rectal cancer Neg Hx   . Esophageal cancer Neg Hx     Allergies  Allergen Reactions  . Hydrocodone-Acetaminophen Swelling    rash  . Penicillins Other (See Comments)    SOB, red face  . Vicodin [Hydrocodone-Acetaminophen]     Red face, pt doesn't remember other symptoms  . Hydrochlorothiazide Rash  . Lisinopril Rash    Current Outpatient Prescriptions on File  Prior to Visit  Medication Sig Dispense Refill  . Ascorbic Acid (VITAMIN C) 1000 MG tablet Take 1,000 mg by mouth daily.    . calcium carbonate (TUMS - DOSED IN MG ELEMENTAL CALCIUM) 500 MG chewable tablet Chew 1 tablet by mouth daily.    . Cholecalciferol (VITAMIN D) 2000 UNITS CAPS Take by mouth.    Marland Kitchen ibuprofen (ADVIL,MOTRIN) 600 MG tablet Take 600 mg by mouth at bedtime as needed.      Marland Kitchen levothyroxine (SYNTHROID, LEVOTHROID) 112 MCG tablet TAKE 1 TABLET DAILY 90 tablet 1  . omeprazole (PRILOSEC) 20 MG capsule Take 1 capsule (20 mg total) by mouth daily. 90 capsule 2  . simvastatin (ZOCOR) 40 MG tablet TAKE 1 TABLET AT BEDTIME 90 tablet 3  . valsartan (DIOVAN) 320 MG tablet TAKE 1 TABLET DAILY 90 tablet 1   No current facility-administered medications on file prior to visit.     BP (!) 152/90 (BP Location: Left Arm, Patient Position: Sitting, Cuff Size: Large)    Temp 97.9 F (36.6 C) (Oral)   Wt 203 lb 12.8 oz (92.4 kg)   BMI 37.28 kg/m       Objective:   Physical Exam  Constitutional: She is oriented to person, place, and time. She appears well-developed and well-nourished. No distress.  HENT:  Head: Normocephalic and atraumatic.  Right Ear: External ear normal.  Left Ear: External ear normal.  Nose: Nose normal.  Mouth/Throat: Oropharynx is clear and moist. No oropharyngeal exudate.  Eyes: Conjunctivae and EOM are normal. Pupils are equal, round, and reactive to light. Right eye exhibits no discharge. Left eye exhibits no discharge. No scleral icterus.  Neck: Normal range of motion. Neck supple. No thyromegaly present.  Cardiovascular: Normal rate, regular rhythm, normal heart sounds and intact distal pulses.  Exam reveals no gallop and no friction rub.   No murmur heard. Pulmonary/Chest: Effort normal. No respiratory distress. She has wheezes (trace wheezes throughout ). She has no rales. She exhibits no tenderness.  Musculoskeletal: Normal range of motion. She exhibits no edema, tenderness or deformity.  Lymphadenopathy:    She has no cervical adenopathy.  Neurological: She is alert and oriented to person, place, and time.  Skin: Skin is warm and dry. No rash noted. She is not diaphoretic. No erythema. No pallor.  Psychiatric: She has a normal mood and affect. Her behavior is normal. Judgment and thought content normal.  Nursing note and vitals reviewed.     Assessment & Plan:  1. Upper respiratory tract infection, unspecified type - Possible bronchitis. No concern for pneumonia at this time. Will have her take prednisone and add mucinex  - predniSONE (DELTASONE) 10 MG tablet; 40 mg x 3 days, 20 mg x 3 days, 10 mg x 3 days  Dispense: 21 tablet; Refill: 0 - Follow up if no improvement in the next 2-3 days or sooner if symptoms worsen or she develops a fever  2. Chest pain, unspecified type - Likely due to constant coughing over the  last 2 weeks.  - EKG 12-Lead- NSR with poor r wave progression. Rate 76

## 2016-11-03 DIAGNOSIS — I8311 Varicose veins of right lower extremity with inflammation: Secondary | ICD-10-CM | POA: Diagnosis not present

## 2016-11-03 DIAGNOSIS — I8312 Varicose veins of left lower extremity with inflammation: Secondary | ICD-10-CM | POA: Diagnosis not present

## 2016-11-03 DIAGNOSIS — I8001 Phlebitis and thrombophlebitis of superficial vessels of right lower extremity: Secondary | ICD-10-CM | POA: Diagnosis not present

## 2016-11-23 DIAGNOSIS — I8312 Varicose veins of left lower extremity with inflammation: Secondary | ICD-10-CM | POA: Diagnosis not present

## 2016-11-23 DIAGNOSIS — I8311 Varicose veins of right lower extremity with inflammation: Secondary | ICD-10-CM | POA: Diagnosis not present

## 2016-12-14 ENCOUNTER — Other Ambulatory Visit: Payer: Self-pay | Admitting: Adult Health

## 2016-12-14 DIAGNOSIS — Z1231 Encounter for screening mammogram for malignant neoplasm of breast: Secondary | ICD-10-CM

## 2016-12-15 DIAGNOSIS — I8001 Phlebitis and thrombophlebitis of superficial vessels of right lower extremity: Secondary | ICD-10-CM | POA: Diagnosis not present

## 2016-12-15 DIAGNOSIS — I8312 Varicose veins of left lower extremity with inflammation: Secondary | ICD-10-CM | POA: Diagnosis not present

## 2017-01-03 ENCOUNTER — Ambulatory Visit
Admission: RE | Admit: 2017-01-03 | Discharge: 2017-01-03 | Disposition: A | Discharge status: Home / Self Care | Payer: Medicare Other | Source: Ambulatory Visit | Attending: Adult Health | Admitting: Adult Health

## 2017-01-03 ENCOUNTER — Other Ambulatory Visit: Payer: Self-pay | Admitting: Adult Health

## 2017-01-03 DIAGNOSIS — Z1231 Encounter for screening mammogram for malignant neoplasm of breast: Secondary | ICD-10-CM

## 2017-01-16 ENCOUNTER — Other Ambulatory Visit: Payer: Self-pay | Admitting: Adult Health

## 2017-01-19 ENCOUNTER — Other Ambulatory Visit: Payer: Self-pay | Admitting: Adult Health

## 2017-01-20 ENCOUNTER — Telehealth: Payer: Self-pay | Admitting: Adult Health

## 2017-01-20 MED ORDER — LEVOTHYROXINE SODIUM 112 MCG PO TABS: 112.0000 ug | ORAL_TABLET | Freq: Every day | ORAL | 0 refills | Status: DC

## 2017-01-20 MED ORDER — VALSARTAN 320 MG PO TABS: 320.0000 mg | ORAL_TABLET | Freq: Every day | ORAL | 0 refills | Status: DC

## 2017-01-20 NOTE — Telephone Encounter (Signed)
Pt just receive a [facility] from [facility] her valsartan has been recalled. Pt would like 30 day supply of bp med to [facility] also would like new rx [facility] for 90 day supply w/refills

## 2017-01-21 ENCOUNTER — Encounter: Payer: Self-pay | Admitting: Adult Health

## 2017-01-21 ENCOUNTER — Encounter: Payer: Self-pay | Admitting: Family Medicine

## 2017-01-21 MED ORDER — LOSARTAN POTASSIUM 100 MG PO TABS: 100.0000 mg | ORAL_TABLET | Freq: Every day | ORAL | 0 refills | Status: DC

## 2017-01-21 NOTE — Telephone Encounter (Signed)
OK to send in Cozaar 100mg  daily.   Please follow up in 1-2 weeks for BP check

## 2017-01-21 NOTE — Telephone Encounter (Signed)
Medication sent in for a 30 days supply to [facility] on [address].  Message sent back to the pt through MyChart to call and schedule follow up appt or schedule by MyChart.

## 2017-02-09 ENCOUNTER — Ambulatory Visit: Payer: Medicare Other | Admitting: Adult Health

## 2017-02-09 ENCOUNTER — Ambulatory Visit (INDEPENDENT_AMBULATORY_CARE_PROVIDER_SITE_OTHER): Payer: Medicare Other | Admitting: Adult Health

## 2017-02-09 ENCOUNTER — Encounter: Payer: Self-pay | Admitting: Adult Health

## 2017-02-09 VITALS — BP 146/106 | Temp 97.7°F | Ht 62.0 in | Wt 202.0 lb

## 2017-02-09 DIAGNOSIS — I10 Essential (primary) hypertension: Secondary | ICD-10-CM | POA: Diagnosis not present

## 2017-02-09 MED ORDER — AMLODIPINE BESYLATE 2.5 MG PO TABS: 2.5000 mg | ORAL_TABLET | Freq: Every day | ORAL | 1 refills | Status: DC

## 2017-02-09 NOTE — Progress Notes (Signed)
Subjective:    Patient ID: Hannah Morrow, female    DOB: 1940/08/27, 76 y.o.   MRN: [id number]  HPI  76 year old female who  has a past medical history of Allergy; ANXIETY; Cancer [facility]); GERD; Hyperlipidemia; Hypertension; Hypothyroid; Plantar fasciitis; and RESTLESS LEGS SYNDROME. She presents to the office today for follow up regarding blood pressure. She was previously taking Valsartan but her medication was on the recall list. She was switched to Cozaar 100mg .   She reports that her blood pressure readings at home have been in the 120's-130's. Today in the office her blood pressure is 146/106 on two separate occassions.   She denies any side effects of the medicatio   Review of Systems See HPI   Past Medical History:  Diagnosis Date  . Allergy    no meds  . ANXIETY   . Cancer [facility])    cervical cancer - hysterectomy  . GERD   . Hyperlipidemia   . Hypertension   . Hypothyroid   . Plantar fasciitis    right  . RESTLESS LEGS SYNDROME     Social History   Social History  . Marital status: Married    Spouse name: N/A  . Number of children: 2  . Years of education: N/A   Occupational History  . Not on file.   Social History Main Topics  . Smoking status: Never Smoker  . Smokeless tobacco: Never Used  . Alcohol use 3.0 oz/week    5 Glasses of wine per week     Comment: Occasional  . Drug use: No  . Sexual activity: Yes    Partners: Male    Birth control/ protection: Surgical, Other-see comments     Comment: hysterectomy   Other Topics Concern  . Not on file   Social History Narrative   Married for 22 years   Two daughters, five grand children and 5 great grand children. Daughter lives in [place]. Other daughter lives in [place].    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    Exercise Does not exercise. Was going to exercise class but has since quite.    Diet: Veggies, chicken, salmon. Very little red  meat. Eats lots of eggs.    Exercise --- walking , gym 3 classes a week    Past Surgical History:  Procedure Laterality Date  . ABDOMINAL HYSTERECTOMY    . BREAST SURGERY     Bilateral breast reduction  . COLOSTOMY  04-22-2010   [name]  . cone bx     cervix  . REDUCTION MAMMAPLASTY Bilateral   . WISDOM TOOTH EXTRACTION      Family History  Problem Relation Age of Onset  . Cancer Sister        melanoma- with mets  . Colon cancer Father 56  . Stomach cancer Maternal Grandfather   . Breast cancer Mother   . Breast cancer Daughter   . Rectal cancer Neg Hx   . Esophageal cancer Neg Hx     Allergies  Allergen Reactions  . Hydrocodone-Acetaminophen Swelling    rash  . Penicillins Other (See Comments)    SOB, red face  . Vicodin [Hydrocodone-Acetaminophen]     Red face, pt doesn't remember other symptoms  . Hydrochlorothiazide Rash  . Lisinopril Rash    Current Outpatient Prescriptions on File Prior to Visit  Medication Sig Dispense Refill  . Ascorbic Acid (VITAMIN  C) 1000 MG tablet Take 1,000 mg by mouth daily.    . calcium carbonate (TUMS - DOSED IN MG ELEMENTAL CALCIUM) 500 MG chewable tablet Chew 1 tablet by mouth daily.    . Cholecalciferol (VITAMIN D) 2000 UNITS CAPS Take by mouth.    . levothyroxine (SYNTHROID, LEVOTHROID) 112 MCG tablet Take 1 tablet (112 mcg total) by mouth daily. 90 tablet 0  . losartan (COZAAR) 100 MG tablet Take 1 tablet (100 mg total) by mouth daily. 30 tablet 0  . omeprazole (PRILOSEC) 20 MG capsule Take 1 capsule (20 mg total) by mouth daily. 90 capsule 2  . simvastatin (ZOCOR) 40 MG tablet TAKE 1 TABLET AT BEDTIME 90 tablet 3  . ibuprofen (ADVIL,MOTRIN) 600 MG tablet Take 600 mg by mouth at bedtime as needed.       No current facility-administered medications on file prior to visit.     BP (!) 146/106 (BP Location: Left Arm)   Temp 97.7 F (36.5 C) (Oral)   Ht 5\' 2"  (1.575 m)   Wt 202 lb (91.6 kg)   BMI 36.95 kg/m         Objective:   Physical Exam  Constitutional: She is oriented to person, place, and time. She appears well-developed and well-nourished. No distress.  Cardiovascular: Normal rate, regular rhythm, normal heart sounds and intact distal pulses.  Exam reveals no gallop and no friction rub.   No murmur heard. Pulmonary/Chest: Effort normal and breath sounds normal. No respiratory distress. She has no wheezes. She has no rales. She exhibits no tenderness.  Neurological: She is alert and oriented to person, place, and time.  Skin: Skin is warm and dry. No rash noted. She is not diaphoretic. No erythema. No pallor.  Psychiatric: She has a normal mood and affect. Her behavior is normal. Judgment and thought content normal.  Nursing note and vitals reviewed.      Assessment & Plan:  1. Essential hypertension - Will add Norvasc 2.5 mg. She will follow up with me in 2 weeks via mychart with her readings. Will titrate as needed - amLODipine (NORVASC) 2.5 MG tablet; Take 1 tablet (2.5 mg total) by mouth daily.  Dispense: 30 tablet; Refill: 1  [name], NP

## 2017-02-14 ENCOUNTER — Encounter: Payer: Self-pay | Admitting: Adult Health

## 2017-02-21 ENCOUNTER — Encounter: Payer: Self-pay | Admitting: Adult Health

## 2017-02-22 ENCOUNTER — Other Ambulatory Visit: Payer: Self-pay | Admitting: Adult Health

## 2017-02-22 DIAGNOSIS — D2261 Melanocytic nevi of right upper limb, including shoulder: Secondary | ICD-10-CM | POA: Diagnosis not present

## 2017-02-22 DIAGNOSIS — L814 Other melanin hyperpigmentation: Secondary | ICD-10-CM | POA: Diagnosis not present

## 2017-02-22 DIAGNOSIS — Z85828 Personal history of other malignant neoplasm of skin: Secondary | ICD-10-CM | POA: Diagnosis not present

## 2017-02-22 DIAGNOSIS — D225 Melanocytic nevi of trunk: Secondary | ICD-10-CM | POA: Diagnosis not present

## 2017-02-22 DIAGNOSIS — D1801 Hemangioma of skin and subcutaneous tissue: Secondary | ICD-10-CM | POA: Diagnosis not present

## 2017-02-22 DIAGNOSIS — L821 Other seborrheic keratosis: Secondary | ICD-10-CM | POA: Diagnosis not present

## 2017-02-22 DIAGNOSIS — D2262 Melanocytic nevi of left upper limb, including shoulder: Secondary | ICD-10-CM | POA: Diagnosis not present

## 2017-02-22 DIAGNOSIS — L820 Inflamed seborrheic keratosis: Secondary | ICD-10-CM | POA: Diagnosis not present

## 2017-02-22 DIAGNOSIS — D2272 Melanocytic nevi of left lower limb, including hip: Secondary | ICD-10-CM | POA: Diagnosis not present

## 2017-02-22 MED ORDER — LOSARTAN POTASSIUM 100 MG PO TABS: 100.0000 mg | ORAL_TABLET | Freq: Every day | ORAL | 1 refills | Status: DC

## 2017-02-22 NOTE — Telephone Encounter (Signed)
Sent to the pharmacy by e-scribe. 

## 2017-03-09 ENCOUNTER — Encounter: Payer: Self-pay | Admitting: Adult Health

## 2017-03-23 ENCOUNTER — Other Ambulatory Visit: Payer: Self-pay | Admitting: Adult Health

## 2017-03-23 DIAGNOSIS — K219 Gastro-esophageal reflux disease without esophagitis: Secondary | ICD-10-CM

## 2017-03-23 NOTE — Telephone Encounter (Signed)
Sent to the pharmacy by e-scribe for 90 days.  Pt due 07/2017 for cpx.

## 2017-03-25 ENCOUNTER — Encounter: Payer: Self-pay | Admitting: Adult Health

## 2017-03-31 ENCOUNTER — Ambulatory Visit: Payer: Medicare Other

## 2017-04-04 ENCOUNTER — Encounter: Payer: Self-pay | Admitting: Adult Health

## 2017-04-05 ENCOUNTER — Ambulatory Visit (INDEPENDENT_AMBULATORY_CARE_PROVIDER_SITE_OTHER): Payer: Medicare Other

## 2017-04-05 DIAGNOSIS — Z23 Encounter for immunization: Secondary | ICD-10-CM

## 2017-04-05 MED ORDER — LOSARTAN POTASSIUM 100 MG PO TABS: 100.0000 mg | ORAL_TABLET | Freq: Every day | ORAL | 0 refills | Status: DC

## 2017-05-28 ENCOUNTER — Other Ambulatory Visit: Payer: Self-pay | Admitting: Adult Health

## 2017-05-31 NOTE — Telephone Encounter (Signed)
Request for valsartan was denied. Pt is no longer taking that medication.  Levothyroxine filled for 90 days.

## 2017-06-09 ENCOUNTER — Other Ambulatory Visit: Payer: Self-pay

## 2017-06-11 ENCOUNTER — Encounter: Payer: Self-pay | Admitting: Adult Health

## 2017-06-21 ENCOUNTER — Other Ambulatory Visit: Payer: Self-pay | Admitting: Adult Health

## 2017-06-21 DIAGNOSIS — K219 Gastro-esophageal reflux disease without esophagitis: Secondary | ICD-10-CM

## 2017-06-22 NOTE — Telephone Encounter (Signed)
Spoke to the pt and scheduled her for yearly visit on 07/26/17 @ 9AM.  Rx sent to the pharmacy by e-scribe.

## 2017-07-08 ENCOUNTER — Encounter: Payer: Self-pay | Admitting: Adult Health

## 2017-07-11 ENCOUNTER — Other Ambulatory Visit: Payer: Self-pay | Admitting: Adult Health

## 2017-07-12 NOTE — Telephone Encounter (Signed)
Sent to the pharmacy by e-scribe.  Pt has upcoming appt on 07/26/17

## 2017-07-26 ENCOUNTER — Ambulatory Visit (INDEPENDENT_AMBULATORY_CARE_PROVIDER_SITE_OTHER): Payer: Medicare Other | Admitting: Adult Health

## 2017-07-26 ENCOUNTER — Encounter: Payer: Self-pay | Admitting: Adult Health

## 2017-07-26 ENCOUNTER — Other Ambulatory Visit: Payer: Self-pay | Admitting: Adult Health

## 2017-07-26 VITALS — BP 164/100 | Temp 97.6°F | Ht 61.5 in | Wt 204.0 lb

## 2017-07-26 DIAGNOSIS — E039 Hypothyroidism, unspecified: Secondary | ICD-10-CM | POA: Diagnosis not present

## 2017-07-26 DIAGNOSIS — E559 Vitamin D deficiency, unspecified: Secondary | ICD-10-CM

## 2017-07-26 DIAGNOSIS — Z0000 Encounter for general adult medical examination without abnormal findings: Secondary | ICD-10-CM | POA: Diagnosis not present

## 2017-07-26 DIAGNOSIS — I10 Essential (primary) hypertension: Secondary | ICD-10-CM | POA: Diagnosis not present

## 2017-07-26 DIAGNOSIS — K219 Gastro-esophageal reflux disease without esophagitis: Secondary | ICD-10-CM | POA: Diagnosis not present

## 2017-07-26 DIAGNOSIS — E782 Mixed hyperlipidemia: Secondary | ICD-10-CM

## 2017-07-26 LAB — CBC WITH DIFFERENTIAL/PLATELET
BASOS ABS: 0.1 10*3/uL (ref 0.0–0.1)
Basophils Relative: 1.4 % (ref 0.0–3.0)
EOS PCT: 4.5 % (ref 0.0–5.0)
Eosinophils Absolute: 0.3 10*3/uL (ref 0.0–0.7)
HCT: 43.3 % (ref 36.0–46.0)
HEMOGLOBIN: 13.9 g/dL (ref 12.0–15.0)
Lymphocytes Relative: 40 % (ref 12.0–46.0)
Lymphs Abs: 2.3 10*3/uL (ref 0.7–4.0)
MCHC: 32.2 g/dL (ref 30.0–36.0)
MCV: 77 fl — AB (ref 78.0–100.0)
MONOS PCT: 9.4 % (ref 3.0–12.0)
Monocytes Absolute: 0.5 10*3/uL (ref 0.1–1.0)
Neutro Abs: 2.6 10*3/uL (ref 1.4–7.7)
Neutrophils Relative %: 44.7 % (ref 43.0–77.0)
Platelets: 226 10*3/uL (ref 150.0–400.0)
RBC: 5.62 Mil/uL — AB (ref 3.87–5.11)
RDW: 14 % (ref 11.5–15.5)
WBC: 5.8 10*3/uL (ref 4.0–10.5)

## 2017-07-26 LAB — HEPATIC FUNCTION PANEL
ALBUMIN: 4.4 g/dL (ref 3.5–5.2)
ALT: 23 U/L (ref 0–35)
AST: 21 U/L (ref 0–37)
Alkaline Phosphatase: 61 U/L (ref 39–117)
Bilirubin, Direct: 0.2 mg/dL (ref 0.0–0.3)
Total Bilirubin: 0.9 mg/dL (ref 0.2–1.2)
Total Protein: 7 g/dL (ref 6.0–8.3)

## 2017-07-26 LAB — LIPID PANEL
CHOLESTEROL: 173 mg/dL (ref 0–200)
HDL: 46.5 mg/dL (ref >39.00)
NonHDL: 126.49
Total CHOL/HDL Ratio: 4
Triglycerides: 206 mg/dL — ABNORMAL HIGH (ref 0.0–149.0)
VLDL: 41.2 mg/dL — AB (ref 0.0–40.0)

## 2017-07-26 LAB — BASIC METABOLIC PANEL
BUN: 18 mg/dL (ref 6–23)
CALCIUM: 9.4 mg/dL (ref 8.4–10.5)
CO2: 28 mEq/L (ref 19–32)
Chloride: 106 mEq/L (ref 96–112)
Creatinine, Ser: 1 mg/dL (ref 0.40–1.20)
GFR: 57.4 mL/min — AB (ref >60.00)
Glucose, Bld: 108 mg/dL — ABNORMAL HIGH (ref 70–99)
POTASSIUM: 4.3 meq/L (ref 3.5–5.1)
SODIUM: 141 meq/L (ref 135–145)

## 2017-07-26 LAB — VITAMIN D 25 HYDROXY (VIT D DEFICIENCY, FRACTURES): VITD: 41.3 ng/mL (ref 30.00–100.00)

## 2017-07-26 LAB — LDL CHOLESTEROL, DIRECT: Direct LDL: 107 mg/dL

## 2017-07-26 LAB — TSH: TSH: 2.5 u[IU]/mL (ref 0.35–4.50)

## 2017-07-26 MED ORDER — METOPROLOL TARTRATE 25 MG PO TABS: 25.0000 mg | ORAL_TABLET | Freq: Two times a day (BID) | ORAL | 0 refills | Status: DC

## 2017-07-26 MED ORDER — LEVOTHYROXINE SODIUM 112 MCG PO TABS: 112.0000 ug | ORAL_TABLET | Freq: Every day | ORAL | 3 refills | Status: DC

## 2017-07-26 NOTE — Progress Notes (Addendum)
Subjective:    Patient ID: Hannah Morrow, female    DOB: 01-Jun-1941, 77 y.o.   MRN: [id number]  HPI  Patient presents for yearly preventative medicine examination. She is a pleasant 77 year old female who  has a past medical history of Allergy, ANXIETY, Cancer ([place]), GERD, Hyperlipidemia, Hypertension, Hypothyroid, Plantar fasciitis, and RESTLESS LEGS SYNDROME.  Due to history of hypertension she takes  Cozaar 100 mg daily. She was prescribed amlodipine but it made her feel " sick". She does not check her blood pressure at home on a regular basis   She takes Synthroid 112 mcg for hypothyroidism  She takes Zocor 40 mg at bedtime for hyperlipidemia  All immunizations and health maintenance protocols were reviewed with the patient and needed orders were placed.  She is up-to-date on her vaccinations  Appropriate screening laboratory values were ordered for the patient including screening of hyperlipidemia, renal function and hepatic function.  Medication reconciliation,  past medical history, social history, problem list and allergies were reviewed in detail with the patient  Goals were established with regard to weight loss, exercise, and  diet in compliance with medications. She does not exercise on a regular basis.   End of life planning was discussed.  She is up-to-date on mammogram, colonoscopy, dental and vision exams.  BP Readings from Last 3 Encounters:  07/26/17 (!) 164/100  02/09/17 (!) 146/106  10/15/16 (!) 152/90    Review of Systems  Constitutional: Negative.   HENT: Negative.   Eyes: Negative.   Respiratory: Negative.   Cardiovascular: Negative.   Gastrointestinal: Negative.   Endocrine: Negative.   Genitourinary: Negative.   Musculoskeletal: Positive for arthralgias and back pain.  Skin: Negative.   Allergic/Immunologic: Negative.   Neurological: Negative.   Hematological: Negative.   Psychiatric/Behavioral: Negative.   All other systems reviewed and  are negative.  Past Medical History:  Diagnosis Date  . Allergy    no meds  . ANXIETY   . Cancer [facility])    cervical cancer - hysterectomy  . GERD   . Hyperlipidemia   . Hypertension   . Hypothyroid   . Plantar fasciitis    right  . RESTLESS LEGS SYNDROME     Social History   Socioeconomic History  . Marital status: Married    Spouse name: Not on file  . Number of children: 2  . Years of education: Not on file  . Highest education level: Not on file  Social Needs  . Financial resource strain: Not on file  . Food insecurity - worry: Not on file  . Food insecurity - inability: Not on file  . Transportation needs - medical: Not on file  . Transportation needs - non-medical: Not on file  Occupational History  . Not on file  Tobacco Use  . Smoking status: Never Smoker  . Smokeless tobacco: Never Used  Substance and Sexual Activity  . Alcohol use: Yes    Alcohol/week: 3.0 oz    Types: 5 Glasses of wine per week    Comment: Occasional  . Drug use: No  . Sexual activity: Yes    Partners: Male    Birth control/protection: Surgical, Other-see comments    Comment: hysterectomy  Other Topics Concern  . Not on file  Social History Narrative   Married for 62 years   Two daughters, five grand children and 5 great grand children. Daughter lives in [place]. Other daughter lives in [place].    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    Exercise Does not exercise. Was going to exercise class but has since quite.    Diet: Veggies, chicken, salmon. Very little red meat. Eats lots of eggs.    Exercise --- walking , gym 3 classes a week    Past Surgical History:  Procedure Laterality Date  . ABDOMINAL HYSTERECTOMY    . BREAST SURGERY     Bilateral breast reduction  . COLOSTOMY  04-22-2010   [name]  . cone bx     cervix  . REDUCTION MAMMAPLASTY Bilateral   . WISDOM TOOTH EXTRACTION      Family History  Problem Relation  Age of Onset  . Cancer Sister        melanoma- with mets  . Colon cancer Father 89  . Stomach cancer Maternal Grandfather   . Breast cancer Mother   . Breast cancer Daughter   . Rectal cancer Neg Hx   . Esophageal cancer Neg Hx     Allergies  Allergen Reactions  . Amlodipine     Pt states it made her feel "awful and weird."  . Hydrocodone-Acetaminophen Swelling    rash  . Penicillins Other (See Comments)    SOB, red face  . Vicodin [Hydrocodone-Acetaminophen]     Red face, pt doesn't remember other symptoms  . Hydrochlorothiazide Rash  . Lisinopril Rash    Current Outpatient Medications on File Prior to Visit  Medication Sig Dispense Refill  . Ascorbic Acid (VITAMIN C) 1000 MG tablet Take 1,000 mg by mouth daily.    . calcium carbonate (TUMS - DOSED IN MG ELEMENTAL CALCIUM) 500 MG chewable tablet Chew 1 tablet by mouth daily.    . Cholecalciferol (VITAMIN D) 2000 UNITS CAPS Take by mouth.    Marland Kitchen ibuprofen (ADVIL,MOTRIN) 600 MG tablet Take 600 mg by mouth at bedtime as needed.      Marland Kitchen levothyroxine (SYNTHROID, LEVOTHROID) 112 MCG tablet TAKE 1 TABLET DAILY 90 tablet 0  . losartan (COZAAR) 100 MG tablet TAKE 1 TABLET DAILY 90 tablet 0  . omeprazole (PRILOSEC) 20 MG capsule TAKE 1 CAPSULE DAILY 90 capsule 0  . simvastatin (ZOCOR) 40 MG tablet TAKE 1 TABLET AT BEDTIME 90 tablet 3   No current facility-administered medications on file prior to visit.     BP (!) 164/100 (BP Location: Left Arm)   Temp 97.6 F (36.4 C) (Oral)   Ht 5' 1.5" (1.562 m)   Wt 204 lb (92.5 kg)   BMI 37.92 kg/m      Objective:   Physical Exam  Constitutional: She is oriented to person, place, and time. She appears well-developed and well-nourished. No distress.  HENT:  Head: Normocephalic and atraumatic.  Right Ear: External ear normal.  Left Ear: External ear normal.  Nose: Nose normal.  Mouth/Throat: Oropharynx is clear and moist. No oropharyngeal exudate.  Eyes: Conjunctivae and EOM are  normal. Pupils are equal, round, and reactive to light. Right eye exhibits no discharge. Left eye exhibits no discharge. No scleral icterus.  Neck: Trachea normal and normal range of motion. Neck supple. No JVD present. Carotid bruit is not present. No tracheal deviation present. No thyroid mass and no thyromegaly present.  Cardiovascular: Normal rate, regular rhythm, normal heart sounds and intact distal pulses. Exam reveals no gallop and no friction rub.  No murmur heard. Pulmonary/Chest: Effort normal and breath sounds normal. No stridor. No respiratory distress. She has  no wheezes. She has no rales. She exhibits no tenderness.  Abdominal: Soft. Bowel sounds are normal. She exhibits no distension and no mass. There is no tenderness. There is no rebound and no guarding.  Genitourinary:  Genitourinary Comments: Done by GYN   Musculoskeletal: Normal range of motion. She exhibits no edema, tenderness or deformity.  Lymphadenopathy:    She has no cervical adenopathy.  Neurological: She is alert and oriented to person, place, and time. She has normal reflexes. She displays normal reflexes. No cranial nerve deficit. She exhibits normal muscle tone. Coordination normal.  Skin: Skin is warm. No rash noted. She is not diaphoretic. No erythema. No pallor.  Psychiatric: She has a normal mood and affect. Her behavior is normal. Judgment and thought content normal.  Nursing note and vitals reviewed.     Assessment & Plan:   1. Mixed hyperlipidemia - Consider increasing statin  - Basic metabolic panel - CBC with Differential/Platelet - Hepatic function panel - Lipid panel - TSH  2. Essential hypertension - Not controlled. Will start on Metoprolol 25 mg BID and have her follow up in 2 weeks. Write down blood pressure readings and bring log to work  - Medical laboratory scientific officer - CBC with Differential/Platelet - Hepatic function panel - Lipid panel - TSH - metoprolol tartrate (LOPRESSOR) 25 MG tablet;  Take 1 tablet (25 mg total) by mouth 2 (two) times daily.  Dispense: 60 tablet; Refill: 0  3. Gastroesophageal reflux disease, esophagitis presence not specified Continue with Prilosec   4. Vitamin D deficiency  - Vitamin D, 25-hydroxy  5. Hypothyroidism, unspecified type - Consider dose change in synthroid  - Basic metabolic panel - CBC with Differential/Platelet - Hepatic function panel - Lipid panel - TSH  [name], NP

## 2017-07-29 ENCOUNTER — Other Ambulatory Visit: Payer: Self-pay | Admitting: Adult Health

## 2017-07-29 DIAGNOSIS — I10 Essential (primary) hypertension: Secondary | ICD-10-CM

## 2017-07-29 MED ORDER — METOPROLOL TARTRATE 25 MG PO TABS: 25.0000 mg | ORAL_TABLET | Freq: Two times a day (BID) | ORAL | 0 refills | Status: DC

## 2017-08-01 DIAGNOSIS — M7731 Calcaneal spur, right foot: Secondary | ICD-10-CM | POA: Diagnosis not present

## 2017-08-01 DIAGNOSIS — M722 Plantar fascial fibromatosis: Secondary | ICD-10-CM | POA: Diagnosis not present

## 2017-08-01 DIAGNOSIS — M79671 Pain in right foot: Secondary | ICD-10-CM | POA: Diagnosis not present

## 2017-08-09 ENCOUNTER — Ambulatory Visit (INDEPENDENT_AMBULATORY_CARE_PROVIDER_SITE_OTHER): Payer: Medicare Other | Admitting: Adult Health

## 2017-08-09 ENCOUNTER — Encounter: Payer: Self-pay | Admitting: Adult Health

## 2017-08-09 VITALS — BP 138/90 | Temp 98.0°F | Wt 202.0 lb

## 2017-08-09 DIAGNOSIS — I10 Essential (primary) hypertension: Secondary | ICD-10-CM | POA: Diagnosis not present

## 2017-08-09 NOTE — Progress Notes (Signed)
Subjective:    Patient ID: Hannah Morrow, female    DOB: February 28, 1941, 77 y.o.   MRN: [id number]  HPI  77 year old female who  has a past medical history of Allergy, ANXIETY, Cancer ([place]), GERD, Hyperlipidemia, Hypertension, Hypothyroid, Plantar fasciitis, and RESTLESS LEGS SYNDROME. She presents to the office today for two week follow up after Metoprolol 25 mg was added to her regimen for hypertension. She reports that she started taking Metoprolol about 1 week ago. Her BP log shows readings that have steadily declined from the 170's/84, into the 130-140's/78 over the last two days.   She denies any lightheadedness or dizziness, nor is she feeling fatigued   BP Readings from Last 3 Encounters:  08/09/17 138/90  07/26/17 (!) 164/100  02/09/17 (!) 146/106     Review of Systems   See HPI    Past Medical History:  Diagnosis Date  . Allergy    no meds  . ANXIETY   . Cancer [name])    cervical cancer - hysterectomy  . GERD   . Hyperlipidemia   . Hypertension   . Hypothyroid   . Plantar fasciitis    right  . RESTLESS LEGS SYNDROME     Social History   Socioeconomic History  . Marital status: Married    Spouse name: Not on file  . Number of children: 2  . Years of education: Not on file  . Highest education level: Not on file  Social Needs  . Financial resource strain: Not on file  . Food insecurity - worry: Not on file  . Food insecurity - inability: Not on file  . Transportation needs - medical: Not on file  . Transportation needs - non-medical: Not on file  Occupational History  . Not on file  Tobacco Use  . Smoking status: Never Smoker  . Smokeless tobacco: Never Used  Substance and Sexual Activity  . Alcohol use: Yes    Alcohol/week: 3.0 oz    Types: 5 Glasses of wine per week    Comment: Occasional  . Drug use: No  . Sexual activity: Yes    Partners: Male    Birth control/protection: Surgical, Other-see comments    Comment: hysterectomy  Other  Topics Concern  . Not on file  Social History Narrative   Married for 80 years   Two daughters, five grand children and 5 great grand children. Daughter lives in [place]. Other daughter lives in [place].    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    Exercise Does not exercise. Was going to exercise class but has since quite.    Diet: Veggies, chicken, salmon. Very little red meat. Eats lots of eggs.    Exercise --- walking , gym 3 classes a week    Past Surgical History:  Procedure Laterality Date  . ABDOMINAL HYSTERECTOMY    . BREAST SURGERY     Bilateral breast reduction  . COLOSTOMY  04-22-2010   [name]  . cone bx     cervix  . REDUCTION MAMMAPLASTY Bilateral   . WISDOM TOOTH EXTRACTION      Family History  Problem Relation Age of Onset  . Cancer Sister        melanoma- with mets  . Colon cancer Father 75  . Stomach cancer Maternal Grandfather   . Breast cancer Mother   . Breast cancer Daughter   . Rectal cancer  Neg Hx   . Esophageal cancer Neg Hx     Allergies  Allergen Reactions  . Amlodipine     Pt states it made her feel "awful and weird."  . Hydrocodone-Acetaminophen Swelling    rash  . Penicillins Other (See Comments)    SOB, red face  . Vicodin [Hydrocodone-Acetaminophen]     Red face, pt doesn't remember other symptoms  . Hydrochlorothiazide Rash  . Lisinopril Rash    Current Outpatient Medications on File Prior to Visit  Medication Sig Dispense Refill  . Ascorbic Acid (VITAMIN C) 1000 MG tablet Take 1,000 mg by mouth daily.    Marland Kitchen b complex vitamins tablet Take 1 tablet by mouth daily.    . calcium carbonate (TUMS - DOSED IN MG ELEMENTAL CALCIUM) 500 MG chewable tablet Chew 1 tablet by mouth daily.    . Cholecalciferol (VITAMIN D) 2000 UNITS CAPS Take by mouth.    Marland Kitchen ibuprofen (ADVIL,MOTRIN) 600 MG tablet Take 600 mg by mouth at bedtime as needed.      Marland Kitchen levothyroxine (SYNTHROID, LEVOTHROID) 112 MCG  tablet Take 1 tablet (112 mcg total) by mouth daily. 90 tablet 3  . losartan (COZAAR) 100 MG tablet TAKE 1 TABLET DAILY 90 tablet 0  . metoprolol tartrate (LOPRESSOR) 25 MG tablet Take 1 tablet (25 mg total) by mouth 2 (two) times daily. 60 tablet 0  . omeprazole (PRILOSEC) 20 MG capsule TAKE 1 CAPSULE DAILY 90 capsule 0  . PRESCRIPTION MEDICATION PREDNISONE TAPER FROM PODIATRY.    . simvastatin (ZOCOR) 40 MG tablet TAKE 1 TABLET AT BEDTIME 90 tablet 3   No current facility-administered medications on file prior to visit.     BP 138/90 (BP Location: Left Arm)   Temp 98 F (36.7 C) (Oral)   Wt 202 lb (91.6 kg)   BMI 37.55 kg/m       Objective:   Physical Exam  Constitutional: She is oriented to person, place, and time. She appears well-developed and well-nourished. No distress.  Cardiovascular: Normal rate, regular rhythm, normal heart sounds and intact distal pulses. Exam reveals no gallop and no friction rub.  No murmur heard. Pulmonary/Chest: Effort normal and breath sounds normal. No respiratory distress. She has no wheezes. She has no rales. She exhibits no tenderness.  Neurological: She is alert and oriented to person, place, and time.  Skin: Skin is warm and dry. No rash noted. She is not diaphoretic. No erythema. No pallor.  Psychiatric: She has a normal mood and affect. Her behavior is normal. Judgment and thought content normal.  Nursing note and vitals reviewed.     Assessment & Plan:  1. Essential hypertension - Better controlled  - No change in medication  - She will let me know when she is done with this prescription and we will send in the XR as she is having trouble remembering to take the medication all the time   [name], NP

## 2017-08-15 DIAGNOSIS — M79671 Pain in right foot: Secondary | ICD-10-CM | POA: Diagnosis not present

## 2017-08-15 DIAGNOSIS — G609 Hereditary and idiopathic neuropathy, unspecified: Secondary | ICD-10-CM | POA: Diagnosis not present

## 2017-08-16 ENCOUNTER — Ambulatory Visit (INDEPENDENT_AMBULATORY_CARE_PROVIDER_SITE_OTHER): Payer: Medicare Other | Admitting: Adult Health

## 2017-08-16 ENCOUNTER — Encounter: Payer: Self-pay | Admitting: Adult Health

## 2017-08-16 VITALS — BP 140/90 | Temp 97.7°F | Wt 205.0 lb

## 2017-08-16 DIAGNOSIS — I10 Essential (primary) hypertension: Secondary | ICD-10-CM | POA: Diagnosis not present

## 2017-08-16 MED ORDER — LISINOPRIL 40 MG PO TABS: 40.0000 mg | ORAL_TABLET | Freq: Every day | ORAL | 1 refills | Status: DC

## 2017-08-16 NOTE — Progress Notes (Signed)
Subjective:    Patient ID: Hannah Morrow, female    DOB: Apr 01, 1941, 77 y.o.   MRN: [id number]  HPI  77 year old female who  has a past medical history of Allergy, ANXIETY, Cancer ([place]), GERD, Hyperlipidemia, Hypertension, Hypothyroid, Plantar fasciitis, and RESTLESS LEGS SYNDROME. She presents to the office today for follow up regarding essential hypertension. She was recently started on Metoprolol and since that time she reports fatigue and "feeling weird".   She denies any headaches, blurred vision, or syncopal episode    Review of Systems  See HPI   Past Medical History:  Diagnosis Date  . Allergy    no meds  . ANXIETY   . Cancer [facility])    cervical cancer - hysterectomy  . GERD   . Hyperlipidemia   . Hypertension   . Hypothyroid   . Plantar fasciitis    right  . RESTLESS LEGS SYNDROME     Social History   Socioeconomic History  . Marital status: Married    Spouse name: Not on file  . Number of children: 2  . Years of education: Not on file  . Highest education level: Not on file  Social Needs  . Financial resource strain: Not on file  . Food insecurity - worry: Not on file  . Food insecurity - inability: Not on file  . Transportation needs - medical: Not on file  . Transportation needs - non-medical: Not on file  Occupational History  . Not on file  Tobacco Use  . Smoking status: Never Smoker  . Smokeless tobacco: Never Used  Substance and Sexual Activity  . Alcohol use: Yes    Alcohol/week: 3.0 oz    Types: 5 Glasses of wine per week    Comment: Occasional  . Drug use: No  . Sexual activity: Yes    Partners: Male    Birth control/protection: Surgical, Other-see comments    Comment: hysterectomy  Other Topics Concern  . Not on file  Social History Narrative   Married for 48 years   Two daughters, five grand children and 5 great grand children. Daughter lives in [place]. Other daughter lives in [place].    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    Exercise Does not exercise. Was going to exercise class but has since quite.    Diet: Veggies, chicken, salmon. Very little red meat. Eats lots of eggs.    Exercise --- walking , gym 3 classes a week    Past Surgical History:  Procedure Laterality Date  . ABDOMINAL HYSTERECTOMY    . BREAST SURGERY     Bilateral breast reduction  . COLOSTOMY  04-22-2010   [name]  . cone bx     cervix  . REDUCTION MAMMAPLASTY Bilateral   . WISDOM TOOTH EXTRACTION      Family History  Problem Relation Age of Onset  . Cancer Sister        melanoma- with mets  . Colon cancer Father 64  . Stomach cancer Maternal Grandfather   . Breast cancer Mother   . Breast cancer Daughter   . Rectal cancer Neg Hx   . Esophageal cancer Neg Hx     Allergies  Allergen Reactions  . Amlodipine     Pt states it made her feel "awful and weird."  . Hydrocodone-Acetaminophen Swelling    rash  . Penicillins Other (See Comments)  SOB, red face  . Vicodin [Hydrocodone-Acetaminophen]     Red face, pt doesn't remember other symptoms  . Hydrochlorothiazide Rash  . Lisinopril Rash    Current Outpatient Medications on File Prior to Visit  Medication Sig Dispense Refill  . Ascorbic Acid (VITAMIN C) 1000 MG tablet Take 1,000 mg by mouth daily.    Marland Kitchen b complex vitamins tablet Take 1 tablet by mouth daily.    . calcium carbonate (TUMS - DOSED IN MG ELEMENTAL CALCIUM) 500 MG chewable tablet Chew 1 tablet by mouth daily.    . Cholecalciferol (VITAMIN D) 2000 UNITS CAPS Take by mouth.    Marland Kitchen ibuprofen (ADVIL,MOTRIN) 600 MG tablet Take 600 mg by mouth at bedtime as needed.      Marland Kitchen levothyroxine (SYNTHROID, LEVOTHROID) 112 MCG tablet Take 1 tablet (112 mcg total) by mouth daily. 90 tablet 3  . losartan (COZAAR) 100 MG tablet TAKE 1 TABLET DAILY 90 tablet 0  . metoprolol tartrate (LOPRESSOR) 25 MG tablet Take 1 tablet (25 mg total) by mouth 2 (two) times daily. 60 tablet 0    . omeprazole (PRILOSEC) 20 MG capsule TAKE 1 CAPSULE DAILY 90 capsule 0  . PRESCRIPTION MEDICATION PREDNISONE TAPER FROM PODIATRY.    . simvastatin (ZOCOR) 40 MG tablet TAKE 1 TABLET AT BEDTIME 90 tablet 3   No current facility-administered medications on file prior to visit.     BP 140/90 (BP Location: Left Arm)   Temp 97.7 F (36.5 C) (Oral)   Wt 205 lb (93 kg)   BMI 38.11 kg/m       Objective:   Physical Exam  Constitutional: She is oriented to person, place, and time. She appears well-developed and well-nourished. No distress.  Cardiovascular: Normal rate, regular rhythm, normal heart sounds and intact distal pulses. Exam reveals no gallop and no friction rub.  No murmur heard. Pulmonary/Chest: Effort normal and breath sounds normal. No respiratory distress. She has no wheezes. She has no rales. She exhibits no tenderness.  Abdominal: Soft. Bowel sounds are normal. She exhibits no distension and no mass. There is no tenderness. There is no rebound and no guarding.  Musculoskeletal: Normal range of motion. She exhibits no edema, tenderness or deformity.  Neurological: She is alert and oriented to person, place, and time. She has normal reflexes. She displays normal reflexes. No cranial nerve deficit. She exhibits normal muscle tone. Coordination normal.  Skin: Skin is warm and dry. No rash noted. She is not diaphoretic. No erythema. No pallor.  Psychiatric: She has a normal mood and affect. Her behavior is normal. Judgment and thought content normal.  Nursing note and vitals reviewed.     Assessment & Plan:  1. Essential hypertension - Will trial her on lisinopril 40 mg  - Monitor BP at home and follow up in 2 weeks  D/c Metoprolol and Cozaar   [name], NP

## 2017-08-30 ENCOUNTER — Ambulatory Visit (INDEPENDENT_AMBULATORY_CARE_PROVIDER_SITE_OTHER): Payer: Medicare Other | Admitting: Adult Health

## 2017-08-30 ENCOUNTER — Encounter: Payer: Self-pay | Admitting: Adult Health

## 2017-08-30 VITALS — BP 128/88 | Temp 97.9°F | Wt 203.0 lb

## 2017-08-30 DIAGNOSIS — I10 Essential (primary) hypertension: Secondary | ICD-10-CM

## 2017-08-30 MED ORDER — LISINOPRIL 40 MG PO TABS: 40.0000 mg | ORAL_TABLET | Freq: Every day | ORAL | 3 refills | Status: DC

## 2017-08-30 NOTE — Progress Notes (Signed)
Subjective:    Patient ID: Hannah Morrow, female    DOB: 02-20-1941, 77 y.o.   MRN: [id number]  HPI  Patient presents for 2 week follow-up of HTN after medication change.  She was previously taking Cozaar and lopressor for BP control but reported feeling "horrible" and not her usual self with low energy and fatigue.  Blood pressure has been well-controlled.  It is 128/88 in the office today. BP at home have been 142-151/84.  She has not had any rash that was previously reported with lisinopril.  She feels like she is back to her normal self. She has an area on her left lateral shin/ankle from a nerve biopsy that is reddened and painful. She has a follow-up appointment next week with the podiatrist that did this procedure.   Review of Systems  Constitutional: Negative.   Respiratory: Negative.   Cardiovascular: Positive for leg swelling. Negative for chest pain and palpitations.       Swelling LLE      Past Medical History:  Diagnosis Date  . Allergy    no meds  . ANXIETY   . Cancer [facility])    cervical cancer - hysterectomy  . GERD   . Hyperlipidemia   . Hypertension   . Hypothyroid   . Plantar fasciitis    right  . RESTLESS LEGS SYNDROME     Social History   Socioeconomic History  . Marital status: Married    Spouse name: Not on file  . Number of children: 2  . Years of education: Not on file  . Highest education level: Not on file  Social Needs  . Financial resource strain: Not on file  . Food insecurity - worry: Not on file  . Food insecurity - inability: Not on file  . Transportation needs - medical: Not on file  . Transportation needs - non-medical: Not on file  Occupational History  . Not on file  Tobacco Use  . Smoking status: Never Smoker  . Smokeless tobacco: Never Used  Substance and Sexual Activity  . Alcohol use: Yes    Alcohol/week: 3.0 oz    Types: 5 Glasses of wine per week    Comment: Occasional  . Drug use: No  . Sexual activity: Yes   Partners: Male    Birth control/protection: Surgical, Other-see comments    Comment: hysterectomy  Other Topics Concern  . Not on file  Social History Narrative   Married for 35 years   Two daughters, five grand children and 5 great grand children. Daughter lives in [place]. Other daughter lives in [place].    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    Exercise Does not exercise. Was going to exercise class but has since quite.    Diet: Veggies, chicken, salmon. Very little red meat. Eats lots of eggs.    Exercise --- walking , gym 3 classes a week    Past Surgical History:  Procedure Laterality Date  . ABDOMINAL HYSTERECTOMY    . BREAST SURGERY     Bilateral breast reduction  . COLOSTOMY  04-22-2010   Arlyce Dice  . cone bx     cervix  . REDUCTION MAMMAPLASTY Bilateral   . WISDOM TOOTH EXTRACTION      Family History  Problem Relation Age of Onset  . Cancer Sister        melanoma- with mets  . Colon cancer Father 9  .  Stomach cancer Maternal Grandfather   . Breast cancer Mother   . Breast cancer Daughter   . Rectal cancer Neg Hx   . Esophageal cancer Neg Hx     Allergies  Allergen Reactions  . Amlodipine     Pt states it made her feel "awful and weird."  . Hydrocodone-Acetaminophen Swelling    rash  . Penicillins Other (See Comments)    SOB, red face  . Vicodin [Hydrocodone-Acetaminophen]     Red face, pt doesn't remember other symptoms  . Hydrochlorothiazide Rash    Current Outpatient Medications on File Prior to Visit  Medication Sig Dispense Refill  . Ascorbic Acid (VITAMIN C) 1000 MG tablet Take 1,000 mg by mouth daily.    Marland Kitchen b complex vitamins tablet Take 1 tablet by mouth daily.    . calcium carbonate (TUMS - DOSED IN MG ELEMENTAL CALCIUM) 500 MG chewable tablet Chew 1 tablet by mouth daily.    . Cholecalciferol (VITAMIN D) 2000 UNITS CAPS Take by mouth.    Marland Kitchen ibuprofen (ADVIL,MOTRIN) 600 MG tablet Take 600 mg  by mouth at bedtime as needed.      Marland Kitchen levothyroxine (SYNTHROID, LEVOTHROID) 112 MCG tablet Take 1 tablet (112 mcg total) by mouth daily. 90 tablet 3  . lisinopril (PRINIVIL,ZESTRIL) 40 MG tablet Take 1 tablet (40 mg total) by mouth daily. 30 tablet 1  . omeprazole (PRILOSEC) 20 MG capsule TAKE 1 CAPSULE DAILY 90 capsule 0  . simvastatin (ZOCOR) 40 MG tablet TAKE 1 TABLET AT BEDTIME 90 tablet 3   No current facility-administered medications on file prior to visit.     BP 128/88 (BP Location: Left Arm)   Temp 97.9 F (36.6 C) (Oral)   Wt 203 lb (92.1 kg)   BMI 37.74 kg/m    Objective:   Physical Exam  Constitutional: She appears well-developed and well-nourished. No distress.  Cardiovascular: Normal rate, regular rhythm, normal heart sounds and intact distal pulses. Exam reveals no gallop and no friction rub.  No murmur heard. Non-pitting edema to left lower extremity.  No tenderness or pain in the calf.   Pulmonary/Chest: Effort normal and breath sounds normal. No respiratory distress. She has no wheezes. She has no rales.  Skin:     Quarter sized area on left lower lateral shin/ankle area where she had a nerve biopsy performed that is slow to heal.  There is an area of scab in the center with redness surrounding blanches.  No drainage noted, streaking noted.   Nursing note and vitals reviewed.     Assessment & Plan:  1. Essential hypertension Blood pressure is well controlled today on current dose of lisinopril 40 mg po QD. May need additional medication in the future if blood pressure is elevated. [facility]   Vaibhav Fogleman C Keniel Ralston BSN RN NP student

## 2017-08-30 NOTE — Progress Notes (Signed)
Subjective:    Patient ID: Hannah Morrow, female    DOB: 02/14/1941, 77 y.o.   MRN: [id number]  HPI  77 year old female who  has a past medical history of Allergy, ANXIETY, Cancer ([place]), GERD, Hyperlipidemia, Hypertension, Hypothyroid, Plantar fasciitis, and RESTLESS LEGS SYNDROME.  She presents to the office today for two week follow up regarding hypertension. During her last visit she was switched from Metoprolol and Cozaar due to side effects and was placed on lisinopril 40 mg.   She denies any side effects such as blurred vision, headaches, or dizziness.   BP Readings from Last 3 Encounters:  08/30/17 128/88  08/16/17 140/90  08/09/17 138/90   Review of Systems  Constitutional: Negative.   Respiratory: Negative.   Cardiovascular: Negative.   Gastrointestinal: Negative.   Genitourinary: Negative.   Neurological: Negative.   Psychiatric/Behavioral: Negative.       Past Medical History:  Diagnosis Date  . Allergy    no meds  . ANXIETY   . Cancer [facility])    cervical cancer - hysterectomy  . GERD   . Hyperlipidemia   . Hypertension   . Hypothyroid   . Plantar fasciitis    right  . RESTLESS LEGS SYNDROME     Social History   Socioeconomic History  . Marital status: Married    Spouse name: Not on file  . Number of children: 2  . Years of education: Not on file  . Highest education level: Not on file  Social Needs  . Financial resource strain: Not on file  . Food insecurity - worry: Not on file  . Food insecurity - inability: Not on file  . Transportation needs - medical: Not on file  . Transportation needs - non-medical: Not on file  Occupational History  . Not on file  Tobacco Use  . Smoking status: Never Smoker  . Smokeless tobacco: Never Used  Substance and Sexual Activity  . Alcohol use: Yes    Alcohol/week: 3.0 oz    Types: 5 Glasses of wine per week    Comment: Occasional  . Drug use: No  . Sexual activity: Yes    Partners: Male    Birth  control/protection: Surgical, Other-see comments    Comment: hysterectomy  Other Topics Concern  . Not on file  Social History Narrative   Married for 26 years   Two daughters, five grand children and 5 great grand children. Daughter lives in [place]. Other daughter lives in [place].    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    Exercise Does not exercise. Was going to exercise class but has since quite.    Diet: Veggies, chicken, salmon. Very little red meat. Eats lots of eggs.    Exercise --- walking , gym 3 classes a week    Past Surgical History:  Procedure Laterality Date  . ABDOMINAL HYSTERECTOMY    . BREAST SURGERY     Bilateral breast reduction  . COLOSTOMY  04-22-2010   [name]  . cone bx     cervix  . REDUCTION MAMMAPLASTY Bilateral   . WISDOM TOOTH EXTRACTION      Family History  Problem Relation Age of Onset  . Cancer Sister        melanoma- with mets  . Colon cancer Father 65  . Stomach cancer Maternal Grandfather   . Breast cancer Mother   . Breast cancer  Daughter   . Rectal cancer Neg Hx   . Esophageal cancer Neg Hx     Allergies  Allergen Reactions  . Amlodipine     Pt states it made her feel "awful and weird."  . Hydrocodone-Acetaminophen Swelling    rash  . Penicillins Other (See Comments)    SOB, red face  . Vicodin [Hydrocodone-Acetaminophen]     Red face, pt doesn't remember other symptoms  . Hydrochlorothiazide Rash    Current Outpatient Medications on File Prior to Visit  Medication Sig Dispense Refill  . Ascorbic Acid (VITAMIN C) 1000 MG tablet Take 1,000 mg by mouth daily.    Marland Kitchen b complex vitamins tablet Take 1 tablet by mouth daily.    . calcium carbonate (TUMS - DOSED IN MG ELEMENTAL CALCIUM) 500 MG chewable tablet Chew 1 tablet by mouth daily.    . Cholecalciferol (VITAMIN D) 2000 UNITS CAPS Take by mouth.    Marland Kitchen ibuprofen (ADVIL,MOTRIN) 600 MG tablet Take 600 mg by mouth at bedtime as  needed.      Marland Kitchen levothyroxine (SYNTHROID, LEVOTHROID) 112 MCG tablet Take 1 tablet (112 mcg total) by mouth daily. 90 tablet 3  . lisinopril (PRINIVIL,ZESTRIL) 40 MG tablet Take 1 tablet (40 mg total) by mouth daily. 30 tablet 1  . omeprazole (PRILOSEC) 20 MG capsule TAKE 1 CAPSULE DAILY 90 capsule 0  . simvastatin (ZOCOR) 40 MG tablet TAKE 1 TABLET AT BEDTIME 90 tablet 3   No current facility-administered medications on file prior to visit.     BP 128/88 (BP Location: Left Arm)   Temp 97.9 F (36.6 C) (Oral)   Wt 203 lb (92.1 kg)   BMI 37.74 kg/m       Objective:   Physical Exam  Constitutional: She is oriented to person, place, and time. She appears well-developed and well-nourished. No distress.  Cardiovascular: Normal rate, regular rhythm, normal heart sounds and intact distal pulses. Exam reveals no gallop.  No murmur heard. Pulmonary/Chest: Effort normal and breath sounds normal. No respiratory distress. She has no wheezes. She has no rales. She exhibits no tenderness.  Neurological: She is alert and oriented to person, place, and time.  Skin: Skin is warm and dry. No rash noted. She is not diaphoretic. No erythema. No pallor.  Localized redness on lateral aspect of both upper legs, from punch biopsy done by podiatry   Psychiatric: She has a normal mood and affect. Her behavior is normal. Thought content normal.  Nursing note and vitals reviewed.     Assessment & Plan:  1. Essential hypertension - Well controlled.  - No change in medications  - Follow up as needed   [name], NP

## 2017-09-05 DIAGNOSIS — M792 Neuralgia and neuritis, unspecified: Secondary | ICD-10-CM | POA: Diagnosis not present

## 2017-09-19 ENCOUNTER — Other Ambulatory Visit: Payer: Self-pay | Admitting: Adult Health

## 2017-09-19 DIAGNOSIS — K219 Gastro-esophageal reflux disease without esophagitis: Secondary | ICD-10-CM

## 2017-09-21 NOTE — Telephone Encounter (Signed)
Sent to the pharmacy by e-scribe. 

## 2017-11-01 NOTE — Progress Notes (Signed)
Subjective:   Hannah Morrow is a 77 y.o. female who presents for Medicare Annual (Subsequent) preventive examination.  Reports health as good Retired in 2002  Had 2 dtr 5 grands 7 great grand children  Spouse and her traveled   Diet Chol/hdl ratio 4 hdl 46; trig 206 A1c 5.7  Eat fairly health salmon and chicken Loves to bake  Weight is stable   BMI 36   Exercise Has a small dog and walks x 2 miles per day (30 minutes on average) Phone has step counter (6000)  Quit smoking in her 20's; very remote and limited use   There are no preventive care reminders to display for this patient.   Breast cancer in the family  Mammogram 01/2017 - annually  Colonoscopy 06/2015; repeat 06/2020 Bone density 12/2012; -0.8 and reported normal  Discussed shingrix and is in process of taking this       Objective:     Vitals: BP 136/80   Pulse 84   Ht 5\' 2"  (1.575 m)   Wt 202 lb (91.6 kg)   SpO2 96%   BMI 36.95 kg/m   Body mass index is 36.95 kg/m.  Advanced Directives 11/02/2017 08/04/2015 06/02/2015  Does Patient Have a Medical Advance Directive? Yes Yes Yes  Type of Advance Directive - Living will Living will  Does patient want to make changes to medical advance directive? - No - Patient declined -  Copy of [place] in Chart? - No - copy requested -    Tobacco Social History   Tobacco Use  Smoking Status Never Smoker  Smokeless Tobacco Never Used     Counseling given: Yes   Clinical Intake:    Past Medical History:  Diagnosis Date  . Allergy    no meds  . ANXIETY   . Cancer [facility])    cervical cancer - hysterectomy  . GERD   . Hyperlipidemia   . Hypertension   . Hypothyroid   . Plantar fasciitis    right  . RESTLESS LEGS SYNDROME    Past Surgical History:  Procedure Laterality Date  . ABDOMINAL HYSTERECTOMY    . BREAST SURGERY     Bilateral breast reduction  . COLOSTOMY  04-22-2010   [name]  . cone bx     cervix  .  REDUCTION MAMMAPLASTY Bilateral   . WISDOM TOOTH EXTRACTION     Family History  Problem Relation Age of Onset  . Cancer Sister        melanoma- with mets  . Colon cancer Father 70  . Stomach cancer Maternal Grandfather   . Breast cancer Mother   . Breast cancer Daughter   . Rectal cancer Neg Hx   . Esophageal cancer Neg Hx    Social History   Socioeconomic History  . Marital status: Married    Spouse name: Not on file  . Number of children: 2  . Years of education: Not on file  . Highest education level: Not on file  Occupational History  . Not on file  Social Needs  . Financial resource strain: Not on file  . Food insecurity:    Worry: Not on file    Inability: Not on file  . Transportation needs:    Medical: Not on file    Non-medical: Not on file  Tobacco Use  . Smoking status: Never Smoker  . Smokeless tobacco: Never Used  Substance and Sexual Activity  . Alcohol use: Yes  Alcohol/week: 3.0 oz    Types: 5 Glasses of wine per week    Comment: Occasional  . Drug use: No  . Sexual activity: Yes    Partners: Male    Birth control/protection: Surgical, Other-see comments    Comment: hysterectomy  Lifestyle  . Physical activity:    Days per week: Not on file    Minutes per session: Not on file  . Stress: Not on file  Relationships  . Social connections:    Talks on phone: Not on file    Gets together: Not on file    Attends religious service: Not on file    Active member of club or organization: Not on file    Attends meetings of clubs or organizations: Not on file    Relationship status: Not on file  Other Topics Concern  . Not on file  Social History Narrative   Married for 76 years   Two daughters, five grand children and 5 great grand children. Daughter lives in [place]. Other daughter lives in [place].    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    Exercise Does not exercise. Was going to exercise  class but has since quite.    Diet: Veggies, chicken, salmon. Very little red meat. Eats lots of eggs.    Exercise --- walking , gym 3 classes a week    Outpatient Encounter Medications as of 11/02/2017  Medication Sig  . Ascorbic Acid (VITAMIN C) 1000 MG tablet Take 1,000 mg by mouth daily.  Marland Kitchen b complex vitamins tablet Take 1 tablet by mouth daily.  . calcium carbonate (TUMS - DOSED IN MG ELEMENTAL CALCIUM) 500 MG chewable tablet Chew 1 tablet by mouth daily.  . Cholecalciferol (VITAMIN D) 2000 UNITS CAPS Take by mouth.  Marland Kitchen ibuprofen (ADVIL,MOTRIN) 600 MG tablet Take 600 mg by mouth at bedtime as needed.    Marland Kitchen levothyroxine (SYNTHROID, LEVOTHROID) 112 MCG tablet Take 1 tablet (112 mcg total) by mouth daily.  Marland Kitchen lisinopril (PRINIVIL,ZESTRIL) 40 MG tablet Take 1 tablet (40 mg total) by mouth daily.  Marland Kitchen omeprazole (PRILOSEC) 20 MG capsule TAKE 1 CAPSULE DAILY  . simvastatin (ZOCOR) 40 MG tablet TAKE 1 TABLET AT BEDTIME   No facility-administered encounter medications on file as of 11/02/2017.     Activities of Daily Living In your present state of health, do you have any difficulty performing the following activities: 11/02/2017  Hearing? N  Vision? N  Difficulty concentrating or making decisions? N  Walking or climbing stairs? N  Dressing or bathing? N  Doing errands, shopping? N  Preparing Food and eating ? N  Using the Toilet? N  In the past six months, have you accidently leaked urine? N  Do you have problems with loss of bowel control? N  Managing your Medications? N  Managing your Finances? N  Housekeeping or managing your Housekeeping? N  Some recent data might be hidden    Patient Care Team: [name], NP as PCP - General (Family Medicine) Harriett Sine, MD as Consulting Physician (Dermatology)    Assessment:   This is a routine wellness examination for Hannah Morrow.  Exercise Activities and Dietary recommendations Current Exercise Habits: Home exercise routine, Type of  exercise: walking, Time (Minutes): 30, Frequency (Times/Week): 5, Weekly Exercise (Minutes/Week): 150, Intensity: Mild  Goals    . Patient Stated     To develop helpful exercises for balance and other  May try to get back into yoga!       Fall Risk Fall Risk  11/02/2017 11/02/2017 07/26/2017 06/09/2017 06/10/2016  Falls in the past year? No No No No No  Comment - - - Emmi Telephone Survey: data to providers prior to load Emmi Telephone Survey: data to providers prior to load  Number falls in past yr: - - - - -  Injury with Fall? - - - - -  Follow up - - - - -   Fell x 77 yo and hamstrings tore No confidence when walking and fear of falling     Depression Screen PHQ 2/9 Scores 07/26/2017 10/18/2014 05/13/2014 09/12/2012  PHQ - 2 Score 0 0 0 1    Made clothes for [employer] doll;  Decorates gourds  Made banners for church  Denies depression but does not sleep well  Restless leg    Cognitive Function Ad8 score reviewed for issues:  Issues making decisions:  Less interest in hobbies / activities:  Repeats questions, stories (family complaining):  Trouble using ordinary gadgets (microwave, computer, phone):  Forgets the month or year:   Mismanaging finances:   Remembering appts:  Daily problems with thinking and/or memory: Ad8 score is=0    MMSE - Mini Mental State Exam 11/02/2017  Not completed: (No Data)        Immunization History  Administered Date(s) Administered  . Influenza, High Dose Seasonal PF 05/19/2015, 05/14/2016, 04/05/2017  . Influenza,inj,Quad PF,6+ Mos 05/13/2014  . Pneumococcal Conjugate-13 05/13/2014  . Pneumococcal Polysaccharide-23 04/23/2013  . Td 12/10/2008  . Zoster 10/06/2012     Screening Tests Health Maintenance  Topic Date Due  . MAMMOGRAM  01/03/2018  . INFLUENZA VACCINE  02/02/2018  . TETANUS/TDAP  12/11/2018  . COLONOSCOPY  06/17/2020  . DEXA SCAN  Completed  . PNA vac Low Risk Adult  Completed        Plan:       PCP Notes  Health Maintenance Has her mammograms every year dexa was noted as normal; so not repeated  Will discuss colonoscopy in 2020   On the list for the shingrix vaccine  Abnormal Screens  BMI elevated but no desire to lose weight ay this time  Describes low energy for once enjoyed hobbies, sewing etc Discussed Transitions as we age and feels she will get through this "slump". Spouse is aging; multiple grand and great grand children. Enjoying church;   Verbalized fear of falling since her fall x 2 year ago with hamstring tear. Discussed various exercise program and balance needs practice. Resources given for assistance or can discuss with [facility] for recommendation. No falls this year  Referrals  none  Patient concerns; As noted  Nurse Concerns; As noted   Next PCP apt TBS; just seen and BP running good at this time with recent changes       I have personally reviewed and noted the following in the patient's chart:   . Medical and social history . Use of alcohol, tobacco or illicit drugs  . Current medications and supplements . Functional ability and status . Nutritional status . Physical activity . Advanced directives . List of other physicians . Hospitalizations, surgeries, and ER visits in previous 12 months . Vitals . Screenings to include cognitive, depression, and falls . Referrals and appointments  In addition, I have reviewed and discussed with patient certain preventive protocols, quality metrics, and best practice recommendations. A written personalized care plan for preventive services as well as  general preventive health recommendations were provided to patient.     Wynetta Fines, RN  11/02/2017

## 2017-11-02 ENCOUNTER — Ambulatory Visit (INDEPENDENT_AMBULATORY_CARE_PROVIDER_SITE_OTHER): Payer: Medicare Other

## 2017-11-02 VITALS — BP 136/80 | HR 84 | Ht 62.0 in | Wt 202.0 lb

## 2017-11-02 DIAGNOSIS — Z0000 Encounter for general adult medical examination without abnormal findings: Secondary | ICD-10-CM

## 2017-11-02 NOTE — Patient Instructions (Addendum)
Hannah Morrow , Thank you for taking time to come for your Medicare Wellness Visit. I appreciate your ongoing commitment to your health goals. Please review the following plan we discussed and let me know if I can assist you in the future.   Shingrix is a vaccine for the prevention of Shingles in Adults 50 and older.  If you are on Medicare, the shingrix is covered under your Part D plan, so you will take both of the vaccines in the series at your pharmacy. Please check with your benefits regarding applicable copays or out of pocket expenses.  The Shingrix is given in 2 vaccines approx 8 weeks apart. You must receive the 2nd dose prior to 6 months from receipt of the first. Please have the pharmacist print out you Immunization  dates for our office records     These are the goals we discussed: Goals    . Patient Stated     To develop helpful exercises for balance and other  May try to get back into yoga!       This is a list of the screening recommended for you and due dates:  Health Maintenance  Topic Date Due  . Mammogram  01/03/2018  . Flu Shot  02/02/2018  . Tetanus Vaccine  12/11/2018  . Colon Cancer Screening  06/17/2020  . DEXA scan (bone density measurement)  Completed  . Pneumonia vaccines  Completed      Manufacturing engineer of Services Cost  A Matter of Balance Class locations vary. Call [place] on Aging for more information.  [email or web address] [phone] 8-Session program addressing the fear of falling and increasing activity levels of older adults Free to minimal cost  A.C.T. By [employer] [address] [place], [place] [address].  [email or web address] [phone]  Personal training, gym, classes including Silver Sneakers* and ACTion for Aging Adults Fee-based  A.H.O.Y. (Add Health to [place]) Airs on Time [employer] 13, M-F at [place]: [employer],   [place] [facility] [place],  [place], [place] [facility], [address] [place] [facility], [place], [place], [place] [address]  [place] Location: Sharrell Ku. [employer] [place]      [phone]  [phone]  [phone]  [phone]  [phone]  [phone]  [phone]  [phone]  [phone]  [phone]    [phone] A total-body conditioning class for adults 47 and older; designed to increase muscular strength, endurance, range of movement, flexibility, balance, agility and coordination Free  [facility] [place], [place] [address] [address]      [address]      [phone]      Pilate's class for individualsreturning to exercise after an injury, before or after surgery or for individuals with complex musculoskeletal issues; designed to improve strength, balance , flexibility      $15/class  [place] [address] [place], [place] [address] [email or web address] [place] classes for beginners to advanced [place],  [place] [address] [email or web address] [place] Chair Exercises Free, ages 11 and older; Ages 43-59 fee based  Marvia Pickles, [facility] [address] [place], [place] [address] [email or web address] .Beverlee Nims [phone]  A.H.O.Y. Tai Chi Fee-based Donation based or free  [place] Class locations vary.   Call or email Angela Burke or view website for more information. [email or web address] [phone] Ongoing classes at local [employer] and gyms Fee-based  Silver Sneakers A.C.T. By [place] [employer]: [place] [phone]  [phone] Classes designed for older adults who want to improve their strength, flexibility, balance and endurance.   Silver sneakers is covered by some insurance plans and includes a fitness center membership at participating locations. Find out more by calling [phone] or visiting [email or web address] Covered by some insurance plans  [facility] [phone] A.H.O.Y., fitness room, personal training, fitness classes for injury prevention, strength, balance, flexibility, water fitness classes Ages 55+: $1 for 6 months; Ages 52-54: $56 for 6 months  Tai Chi for Everybody [facility] [address] [place], [place] [address] [email or web address] .Patsi Sears [phone] Tai Chi classes for beginners to advanced; geared for seniors Donation Based      [employer] ([place] Others Participate in Exercise     Loyal Gambler. Rosana Hoes, PhD, [place] [email or web address] [place]     [phone]     A comprehensive fitness program for adults.  The program paris senior-level undergraduates Kinesiology students with adults who desire to learn how to exercise safely.  Includes a structural exercise class focusing on functional fitnesss     $100/semester in fall and spring; $75 in summer (no trainers)    *Silver Sneakers is covered by  some Personal assistant and includes a  Radio producer at participating locations.  Find out more by calling [phone] or visiting [email or web address]  For additional health and human services resources for senior adults, please contact SeniorLine at [phone] in [place] and [place] at [phone] in all other areas.  Fat and Cholesterol Restricted Diet High levels of fat and cholesterol in your blood may lead to various health problems, such as diseases of the heart, blood vessels, gallbladder, liver, and pancreas. Fats are concentrated sources of energy that come in various forms. Certain types of fat, including saturated fat, may be harmful in excess. Cholesterol is a substance needed by your body in small amounts. Your body makes all the cholesterol it needs. Excess cholesterol comes from the food you eat. When you have high levels of cholesterol and saturated fat in your blood, health problems can develop because the excess fat and cholesterol will gather along the walls of your blood vessels, causing them to narrow. Choosing the right foods will help you control your intake of fat and cholesterol. This will help keep the levels of these substances in your blood within normal limits and reduce your risk of disease. What is my plan? Your health care provider recommends that you:  Limit your fat intake to ______% or less of your total calories per day.  Limit the amount of cholesterol in your diet to less than _________mg per day.  Eat 20-30 grams of fiber each day.  What types of fat should I choose?  Choose healthy fats more often. Choose monounsaturated and polyunsaturated fats, such as olive and canola oil, flaxseeds, walnuts, almonds, and seeds.  Eat more omega-3 fats. Good choices include salmon, mackerel, sardines, tuna, flaxseed oil, and ground flaxseeds. Aim to eat fish at least two times a week.  Limit saturated fats. Saturated fats are primarily found in animal  products, such as meats, butter, and cream. Plant sources of saturated fats include palm oil, palm kernel oil, and coconut oil.  Avoid foods with partially hydrogenated oils in them. These contain trans fats. Examples of foods that contain trans fats are stick margarine, some tub margarines, cookies, crackers, and other baked goods. What general guidelines do I need to follow? These guidelines for healthy eating will help you control your intake of fat and cholesterol:  Check food labels carefully to identify foods with trans fats or high amounts of saturated fat.  Fill one half of your plate with vegetables and green salads.  Fill one fourth of your plate with whole grains. Look for the word "whole" as the first word in the ingredient list.  Fill one fourth of your plate with lean protein foods.  Limit fruit to two servings a day. Choose fruit instead of juice.  Eat more foods that contain fiber, such as apples, broccoli, carrots, beans, peas, and barley.  Eat more home-cooked food and less restaurant, buffet, and fast food.  Limit or avoid alcohol.  Limit foods high in starch and sugar.  Limit fried foods.  Cook foods using methods other than frying. Baking, boiling, grilling, and broiling are all great options.  Lose weight if you are overweight. Losing just 5-10% of your initial body weight can help your overall health and prevent diseases such as diabetes and heart disease.  What foods can I eat? Grains  Whole grains, such as whole wheat or whole grain breads, crackers, cereals, and pasta. Unsweetened oatmeal, bulgur, barley, quinoa, or brown [place]. Corn or whole wheat flour tortillas. Vegetables  Fresh or frozen vegetables (raw, steamed, roasted, or grilled). Green salads. Fruits  All fresh, canned (in natural juice), or frozen fruits. Meats and other protein foods  Ground beef (85% or leaner), grass-fed beef, or beef trimmed of fat. Skinless chicken or [place]. Ground  chicken or [place]. Pork trimmed of fat. All fish and seafood. Eggs. Dried beans, peas, or lentils. Unsalted nuts or seeds. Unsalted canned or dry beans. Dairy  Low-fat dairy products, such as skim or 1% milk, 2% or reduced-fat cheeses, low-fat ricotta or cottage cheese, or plain low-fat yo Fats and oils  Tub margarines without trans fats. Light or reduced-fat mayonnaise and salad dressings. Avocado. Olive, canola, sesame, or safflower oils. Natural peanut or almond butter (choose ones without added sugar and oil). The items listed above may not be a complete list of recommended foods or beverages. Contact your dietitian for more options. Foods to avoid Grains  White bread. White pasta. White [place]. Cornbread. Bagels, pastries, and croissants. Crackers that contain trans fat. Vegetables  White potatoes. Corn. Creamed or fried vegetables. Vegetables in a cheese sauce. Fruits  Dried fruits. Canned fruit in light or heavy syrup. Fruit juice. Meats and other protein foods  Fatty cuts of meat. Ribs, chicken wings, bacon, sausage, bologna, salami, chitterlings, fatback, hot dogs, bratwurst, and packaged luncheon meats. Liver and organ meats. Dairy  Whole or 2% milk, cream, half-and-half, and cream cheese. Whole milk cheeses. Whole-fat or sweetened yogurt. Full-fat cheeses. Nondairy creamers and whipped toppings. Processed cheese, cheese spreads,  or cheese curds. Beverages  Alcohol. Sweetened drinks (such as sodas, lemonade, and fruit drinks or punches). Fats and oils  Butter, stick margarine, lard, shortening, ghee, or bacon fat. Coconut, palm kernel, or palm oils. Sweets and desserts  Corn syrup, sugars, honey, and molasses. Candy. Jam and jelly. Syrup. Sweetened cereals. Cookies, pies, cakes, donuts, muffins, and ice cream. The items listed above may not be a complete list of foods and beverages to avoid. Contact your dietitian for more information. This information is not intended to  replace advice given to you by your health care provider. Make sure you discuss any questions you have with your health care provider. Document Released: 06/21/2005 Document Revised: 07/12/2014 Document Reviewed: 09/19/2013 Elsevier Interactive Patient Education  2018 [place] in the Home Falls can cause injuries. They can happen to people of all ages. There are many things you can do to make your home safe and to help prevent falls. What can I do on the outside of my home?  Regularly fix the edges of walkways and driveways and fix any cracks.  Remove anything that might make you trip as you walk through a door, such as a raised step or threshold.  Trim any bushes or trees on the path to your home.  Use bright outdoor lighting.  Clear any walking paths of anything that might make someone trip, such as rocks or tools.  Regularly check to see if handrails are loose or broken. Make sure that both sides of any steps have handrails.  Any raised decks and porches should have guardrails on the edges.  Have any leaves, snow, or ice cleared regularly.  Use sand or salt on walking paths during winter.  Clean up any spills in your garage right away. This includes oil or grease spills. What can I do in the bathroom?  Use night lights.  Install grab bars by the toilet and in the tub and shower. Do not use towel bars as grab bars.  Use non-skid mats or decals in the tub or shower.  If you need to sit down in the shower, use a plastic, non-slip stool.  Keep the floor dry. Clean up any water that spills on the floor as soon as it happens.  Remove soap buildup in the tub or shower regularly.  Attach bath mats securely with double-sided non-slip rug tape.  Do not have throw rugs and other things on the floor that can make you trip. What can I do in the bedroom?  Use night lights.  Make sure that you have a light by your bed that is easy to reach.  Do not use  any sheets or blankets that are too big for your bed. They should not hang down onto the floor.  Have a firm chair that has side arms. You can use this for support while you get dressed.  Do not have throw rugs and other things on the floor that can make you trip. What can I do in the kitchen?  Clean up any spills right away.  Avoid walking on wet floors.  Keep items that you use a lot in easy-to-reach places.  If you need to reach something above you, use a strong step stool that has a grab bar.  Keep electrical cords out of the way.  Do not use floor polish or wax that makes floors slippery. If you must use wax, use non-skid floor wax.  Do not have throw rugs and other  things on the floor that can make you trip. What can I do with my stairs?  Do not leave any items on the stairs.  Make sure that there are handrails on both sides of the stairs and use them. Fix handrails that are broken or loose. Make sure that handrails are as long as the stairways.  Check any carpeting to make sure that it is firmly attached to the stairs. Fix any carpet that is loose or worn.  Avoid having throw rugs at the top or bottom of the stairs. If you do have throw rugs, attach them to the floor with carpet tape.  Make sure that you have a light switch at the top of the stairs and the bottom of the stairs. If you do not have them, ask someone to add them for you. What else can I do to help prevent falls?  Wear shoes that: ? Do not have high heels. ? Have rubber bottoms. ? Are comfortable and fit you well. ? Are closed at the toe. Do not wear sandals.  If you use a stepladder: ? Make sure that it is fully opened. Do not climb a closed stepladder. ? Make sure that both sides of the stepladder are locked into place. ? Ask someone to hold it for you, if possible.  Clearly mark and make sure that you can see: ? Any grab bars or handrails. ? First and last steps. ? Where the edge of each step  is.  Use tools that help you move around (mobility aids) if they are needed. These include: ? Canes. ? Walkers. ? Scooters. ? Crutches.  Turn on the lights when you go into a dark area. Replace any light bulbs as soon as they burn out.  Set up your furniture so you have a clear path. Avoid moving your furniture around.  If any of your floors are uneven, fix them.  If there are any pets around you, be aware of where they are.  Review your medicines with your doctor. Some medicines can make you feel dizzy. This can increase your chance of falling. Ask your doctor what other things that you can do to help prevent falls. This information is not intended to replace advice given to you by your health care provider. Make sure you discuss any questions you have with your health care provider. Document Released: 04/17/2009 Document Revised: 11/27/2015 Document Reviewed: 07/26/2014 Elsevier Interactive Patient Education  2018 [place] Maintenance, Female Adopting a healthy lifestyle and getting preventive care can go a long way to promote health and wellness. Talk with your health care provider about what schedule of regular examinations is right for you. This is a good chance for you to check in with your provider about disease prevention and staying healthy. In between checkups, there are plenty of things you can do on your own. Experts have done a lot of research about which lifestyle changes and preventive measures are most likely to keep you healthy. Ask your health care provider for more information. Weight and diet Eat a healthy diet  Be sure to include plenty of vegetables, fruits, low-fat dairy products, and lean protein.  Do not eat a lot of foods high in solid fats, added sugars, or salt.  Get regular exercise. This is one of the most important things you can do for your health. ? Most adults should exercise for at least 150 minutes each week. The exercise should  increase your heart rate and make  you sweat (moderate-intensity exercise). ? Most adults should also do strengthening exercises at least twice a week. This is in addition to the moderate-intensity exercise.  Maintain a healthy weight  Body mass index (BMI) is a measurement that can be used to identify possible weight problems. It estimates body fat based on height and weight. Your health care provider can help determine your BMI and help you achieve or maintain a healthy weight.  For females 110 years of age and older: ? A BMI below 18.5 is considered underweight. ? A BMI of 18.5 to 24.9 is normal. ? A BMI of 25 to 29.9 is considered overweight. ? A BMI of 30 and above is considered obese.  Watch levels of cholesterol and blood lipids  You should start having your blood tested for lipids and cholesterol at 76 years of age, then have this test every 5 years.  You may need to have your cholesterol levels checked more often if: ? Your lipid or cholesterol levels are high. ? You are older than 76 years of age. ? You are at high risk for heart disease.  Cancer screening Lung Cancer  Lung cancer screening is recommended for adults 54-74 years old who are at high risk for lung cancer because of a history of smoking.  A yearly low-dose CT scan of the lungs is recommended for people who: ? Currently smoke. ? Have quit within the past 15 years. ? Have at least a 30-pack-year history of smoking. A pack year is smoking an average of one pack of cigarettes a day for 1 year.  Yearly screening should continue until it has been 15 years since you quit.  Yearly screening should stop if you develop a health problem that would prevent you from having lung cancer treatment.  Breast Cancer  Practice breast self-awareness. This means understanding how your breasts normally appear and feel.  It also means doing regular breast self-exams. Let your health care provider know about any changes, no matter  how small.  If you are in your 20s or 30s, you should have a clinical breast exam (CBE) by a health care provider every 1-3 years as part of a regular health exam.  If you are 15 or older, have a CBE every year. Also consider having a breast X-ray (mammogram) every year.  If you have a family history of breast cancer, talk to your health care provider about genetic screening.  If you are at high risk for breast cancer, talk to your health care provider about having an MRI and a mammogram every year.  Breast cancer gene (BRCA) assessment is recommended for women who have family members with BRCA-related cancers. BRCA-related cancers include: ? Breast. ? Ovarian. ? Tubal. ? Peritoneal cancers.  Results of the assessment will determine the need for genetic counseling and BRCA1 and BRCA2 testing.  Cervical Cancer Your health care provider may recommend that you be screened regularly for cancer of the pelvic organs (ovaries, uterus, and vagina). This screening involves a pelvic examination, including checking for microscopic changes to the surface of your cervix (Pap test). You may be encouraged to have this screening done every 3 years, beginning at age 94.  For women ages 80-65, health care providers may recommend pelvic exams and Pap testing every 3 years, or they may recommend the Pap and pelvic exam, combined with testing for human papilloma virus (HPV), every 5 years. Some types of HPV increase your risk of cervical cancer. Testing for HPV may also  be done on women of any age with unclear Pap test results.  Other health care providers may not recommend any screening for nonpregnant women who are considered low risk for pelvic cancer and who do not have symptoms. Ask your health care provider if a screening pelvic exam is right for you.  If you have had past treatment for cervical cancer or a condition that could lead to cancer, you need Pap tests and screening for cancer for at least 20  years after your treatment. If Pap tests have been discontinued, your risk factors (such as having a new sexual partner) need to be reassessed to determine if screening should resume. Some women have medical problems that increase the chance of getting cervical cancer. In these cases, your health care provider may recommend more frequent screening and Pap tests.  Colorectal Cancer  This type of cancer can be detected and often prevented.  Routine colorectal cancer screening usually begins at 76 years of age and continues through 76 years of age.  Your health care provider may recommend screening at an earlier age if you have risk factors for colon cancer.  Your health care provider may also recommend using home test kits to check for hidden blood in the stool.  A small camera at the end of a tube can be used to examine your colon directly (sigmoidoscopy or colonoscopy). This is done to check for the earliest forms of colorectal cancer.  Routine screening usually begins at age 33.  Direct examination of the colon should be repeated every 5-10 years through 76 years of age. However, you may need to be screened more often if early forms of precancerous polyps or small growths are found.  Skin Cancer  Check your skin from head to toe regularly.  Tell your health care provider about any new moles or changes in moles, especially if there is a change in a mole's shape or color.  Also tell your health care provider if you have a mole that is larger than the size of a pencil eraser.  Always use sunscreen. Apply sunscreen liberally and repeatedly throughout the day.  Protect yourself by wearing long sleeves, pants, a wide-brimmed hat, and sunglasses whenever you are outside.  Heart disease, diabetes, and high blood pressure  High blood pressure causes heart disease and increases the risk of stroke. High blood pressure is more likely to develop in: ? People who have blood pressure in the high  end of the normal range (130-139/85-89 mm Hg). ? People who are overweight or obese. ? People who are African American.  If you are 81-45 years of age, have your blood pressure checked every 3-5 years. If you are 44 years of age or older, have your blood pressure checked every year. You should have your blood pressure measured twice-once when you are at a hospital or clinic, and once when you are not at a hospital or clinic. Record the average of the two measurements. To check your blood pressure when you are not at a hospital or clinic, you can use: ? An automated blood pressure machine at a pharmacy. ? A home blood pressure monitor.  If you are between 64 years and 61 years old, ask your health care provider if you should take aspirin to prevent strokes.  Have regular diabetes screenings. This involves taking a blood sample to check your fasting blood sugar level. ? If you are at a normal weight and have a low risk for diabetes, have this  test once every three years after 76 years of age. ? If you are overweight and have a high risk for diabetes, consider being tested at a younger age or more often. Preventing infection Hepatitis B  If you have a higher risk for hepatitis B, you should be screened for this virus. You are considered at high risk for hepatitis B if: ? You were born in a country where hepatitis B is common. Ask your health care provider which countries are considered high risk. ? Your parents were born in a high-risk country, and you have not been immunized against hepatitis B (hepatitis B vaccine). ? You have HIV or AIDS. ? You use needles to inject street drugs. ? You live with someone who has hepatitis B. ? You have had sex with someone who has hepatitis B. ? You get hemodialysis treatment. ? You take certain medicines for conditions, including cancer, organ transplantation, and autoimmune conditions.  Hepatitis C  Blood testing is recommended for: ? Everyone born from  [birth year] through 1964. ? Anyone with known risk factors for hepatitis C.  Sexually transmitted infections (STIs)  You should be screened for sexually transmitted infections (STIs) including gonorrhea and chlamydia if: ? You are sexually active and are younger than 76 years of age. ? You are older than 76 years of age and your health care provider tells you that you are at risk for this type of infection. ? Your sexual activity has changed since you were last screened and you are at an increased risk for chlamydia or gonorrhea. Ask your health care provider if you are at risk.  If you do not have HIV, but are at risk, it may be recommended that you take a prescription medicine daily to prevent HIV infection. This is called pre-exposure prophylaxis (PrEP). You are considered at risk if: ? You are sexually active and do not regularly use condoms or know the HIV status of your partner(s). ? You take drugs by injection. ? You are sexually active with a partner who has HIV.  Talk with your health care provider about whether you are at high risk of being infected with HIV. If you choose to begin PrEP, you should first be tested for HIV. You should then be tested every 3 months for as long as you are taking PrEP. Pregnancy  If you are premenopausal and you may become pregnant, ask your health care provider about preconception counseling.  If you may become pregnant, take 400 to 800 micrograms (mcg) of folic acid every day.  If you want to prevent pregnancy, talk to your health care provider about birth control (contraception). Osteoporosis and menopause  Osteoporosis is a disease in which the bones lose minerals and strength with aging. This can result in serious bone fractures. Your risk for osteoporosis can be identified using a bone density scan.  If you are 46 years of age or older, or if you are at risk for osteoporosis and fractures, ask your health care provider if you should be  screened.  Ask your health care provider whether you should take a calcium or vitamin D supplement to lower your risk for osteoporosis.  Menopause may have certain physical symptoms and risks.  Hormone replacement therapy may reduce some of these symptoms and risks. Talk to your health care provider about whether hormone replacement therapy is right for you. Follow these instructions at home:  Schedule regular health, dental, and eye exams.  Stay current with your immunizations.  Do not  use any tobacco products including cigarettes, chewing tobacco, or electronic cigarettes.  If you are pregnant, do not drink alcohol.  If you are breastfeeding, limit how much and how often you drink alcohol.  Limit alcohol intake to no more than 1 drink per day for nonpregnant women. One drink equals 12 ounces of beer, 5 ounces of wine, or 1 ounces of hard liquor.  Do not use street drugs.  Do not share needles.  Ask your health care provider for help if you need support or information about quitting drugs.  Tell your health care provider if you often feel depressed.  Tell your health care provider if you have ever been abused or do not feel safe at home. This information is not intended to replace advice given to you by your health care provider. Make sure you discuss any questions you have with your health care provider. Document Released: 01/04/2011 Document Revised: 11/27/2015 Document Reviewed: 03/25/2015 Elsevier Interactive Patient Education  [employer].

## 2017-11-02 NOTE — Progress Notes (Signed)
I have reviewed documentation for AWV and Advance Care Planning provided by the health coach and agree with documentation. I was immediately available for questions.  

## 2017-12-12 ENCOUNTER — Other Ambulatory Visit: Payer: Self-pay | Admitting: Adult Health

## 2017-12-12 DIAGNOSIS — Z1231 Encounter for screening mammogram for malignant neoplasm of breast: Secondary | ICD-10-CM

## 2018-01-06 ENCOUNTER — Ambulatory Visit
Admission: RE | Admit: 2018-01-06 | Discharge: 2018-01-06 | Disposition: A | Discharge status: Home / Self Care | Payer: Medicare Other | Source: Ambulatory Visit | Attending: Adult Health | Admitting: Adult Health

## 2018-01-06 DIAGNOSIS — Z1231 Encounter for screening mammogram for malignant neoplasm of breast: Secondary | ICD-10-CM | POA: Diagnosis not present

## 2018-02-07 DIAGNOSIS — H40033 Anatomical narrow angle, bilateral: Secondary | ICD-10-CM | POA: Diagnosis not present

## 2018-02-07 DIAGNOSIS — H2513 Age-related nuclear cataract, bilateral: Secondary | ICD-10-CM | POA: Diagnosis not present

## 2018-02-15 ENCOUNTER — Other Ambulatory Visit: Payer: Self-pay | Admitting: Adult Health

## 2018-02-15 NOTE — Telephone Encounter (Signed)
Sent to the pharmacy by e-scribe. 

## 2018-02-22 DIAGNOSIS — D225 Melanocytic nevi of trunk: Secondary | ICD-10-CM | POA: Diagnosis not present

## 2018-02-22 DIAGNOSIS — L814 Other melanin hyperpigmentation: Secondary | ICD-10-CM | POA: Diagnosis not present

## 2018-02-22 DIAGNOSIS — L821 Other seborrheic keratosis: Secondary | ICD-10-CM | POA: Diagnosis not present

## 2018-02-22 DIAGNOSIS — Z85828 Personal history of other malignant neoplasm of skin: Secondary | ICD-10-CM | POA: Diagnosis not present

## 2018-02-22 DIAGNOSIS — L820 Inflamed seborrheic keratosis: Secondary | ICD-10-CM | POA: Diagnosis not present

## 2018-03-13 DIAGNOSIS — H2513 Age-related nuclear cataract, bilateral: Secondary | ICD-10-CM | POA: Diagnosis not present

## 2018-03-13 DIAGNOSIS — H40033 Anatomical narrow angle, bilateral: Secondary | ICD-10-CM | POA: Diagnosis not present

## 2018-03-30 ENCOUNTER — Ambulatory Visit (INDEPENDENT_AMBULATORY_CARE_PROVIDER_SITE_OTHER): Payer: Medicare Other | Admitting: Adult Health

## 2018-03-30 ENCOUNTER — Encounter: Payer: Self-pay | Admitting: Adult Health

## 2018-03-30 VITALS — BP 142/88 | HR 95 | Temp 98.2°F | Wt 198.0 lb

## 2018-03-30 DIAGNOSIS — J069 Acute upper respiratory infection, unspecified: Secondary | ICD-10-CM | POA: Diagnosis not present

## 2018-03-30 LAB — POCT RAPID STREP A (OFFICE): RAPID STREP A SCREEN: NEGATIVE

## 2018-03-30 MED ORDER — DOXYCYCLINE HYCLATE 100 MG PO CAPS: 100.0000 mg | ORAL_CAPSULE | Freq: Two times a day (BID) | ORAL | 0 refills | Status: DC

## 2018-03-30 MED ORDER — BENZONATATE 200 MG PO CAPS: 200.0000 mg | ORAL_CAPSULE | Freq: Two times a day (BID) | ORAL | 0 refills | Status: DC | PRN

## 2018-03-30 NOTE — Progress Notes (Signed)
Subjective:    Patient ID: Hannah Morrow, female    DOB: 03/01/1941, 77 y.o.   MRN: [id number]  URI   This is a new problem. Episode onset: 9 days  The problem has been gradually worsening. There has been no fever. Associated symptoms include congestion, coughing (productive ), rhinorrhea and a sore throat. Pertinent negatives include no ear pain, headaches, nausea, sinus pain, vomiting or wheezing. She has tried decongestant (zyrtec) for the symptoms. The treatment provided no relief.      Review of Systems  Constitutional: Positive for activity change and fatigue. Negative for chills, diaphoresis and fever.  HENT: Positive for congestion, postnasal drip, rhinorrhea and sore throat. Negative for ear pain, sinus pain, trouble swallowing and voice change.   Respiratory: Positive for cough (productive ). Negative for chest tightness, shortness of breath and wheezing.   Cardiovascular: Negative.   Gastrointestinal: Negative for nausea and vomiting.  Neurological: Negative for headaches.   Past Medical History:  Diagnosis Date  . Allergy    no meds  . ANXIETY   . Cancer [facility])    cervical cancer - hysterectomy  . GERD   . Hyperlipidemia   . Hypertension   . Hypothyroid   . Plantar fasciitis    right  . RESTLESS LEGS SYNDROME     Social History   Socioeconomic History  . Marital status: Married    Spouse name: Not on file  . Number of children: 2  . Years of education: Not on file  . Highest education level: Not on file  Occupational History  . Not on file  Social Needs  . Financial resource strain: Not on file  . Food insecurity:    Worry: Not on file    Inability: Not on file  . Transportation needs:    Medical: Not on file    Non-medical: Not on file  Tobacco Use  . Smoking status: Never Smoker  . Smokeless tobacco: Never Used  Substance and Sexual Activity  . Alcohol use: Yes    Alcohol/week: 5.0 standard drinks    Types: 5 Glasses of wine per week   Comment: Occasional  . Drug use: No  . Sexual activity: Yes    Partners: Male    Birth control/protection: Surgical, Other-see comments    Comment: hysterectomy  Lifestyle  . Physical activity:    Days per week: Not on file    Minutes per session: Not on file  . Stress: Not on file  Relationships  . Social connections:    Talks on phone: Not on file    Gets together: Not on file    Attends religious service: Not on file    Active member of club or organization: Not on file    Attends meetings of clubs or organizations: Not on file    Relationship status: Not on file  . Intimate partner violence:    Fear of current or ex partner: Not on file    Emotionally abused: Not on file    Physically abused: Not on file    Forced sexual activity: Not on file  Other Topics Concern  . Not on file  Social History Narrative   Married for 49 years   Two daughters, five grand children and 5 great grand children. Daughter lives in [place]. Other daughter lives in [place].    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    Exercise  Does not exercise. Was going to exercise class but has since quite.    Diet: Veggies, chicken, salmon. Very little red meat. Eats lots of eggs.    Exercise --- walking , gym 3 classes a week    Past Surgical History:  Procedure Laterality Date  . ABDOMINAL HYSTERECTOMY    . BREAST SURGERY     Bilateral breast reduction  . COLOSTOMY  04-22-2010   [name]  . cone bx     cervix  . REDUCTION MAMMAPLASTY Bilateral   . WISDOM TOOTH EXTRACTION      Family History  Problem Relation Age of Onset  . Cancer Sister        melanoma- with mets  . Colon cancer Father 43  . Stomach cancer Maternal Grandfather   . Breast cancer Mother 66  . Breast cancer Daughter 68  . Rectal cancer Neg Hx   . Esophageal cancer Neg Hx     Allergies  Allergen Reactions  . Amlodipine     Pt states it made her feel "awful and weird."  .  Hydrocodone-Acetaminophen Swelling    rash  . Penicillins Other (See Comments)    SOB, red face  . Vicodin [Hydrocodone-Acetaminophen]     Red face, pt doesn't remember other symptoms    Current Outpatient Medications on File Prior to Visit  Medication Sig Dispense Refill  . Ascorbic Acid (VITAMIN C) 1000 MG tablet Take 1,000 mg by mouth daily.    Marland Kitchen b complex vitamins tablet Take 1 tablet by mouth daily.    . calcium carbonate (TUMS - DOSED IN MG ELEMENTAL CALCIUM) 500 MG chewable tablet Chew 1 tablet by mouth daily.    . Cholecalciferol (VITAMIN D) 2000 UNITS CAPS Take by mouth.    Marland Kitchen ibuprofen (ADVIL,MOTRIN) 600 MG tablet Take 600 mg by mouth at bedtime as needed.      Marland Kitchen levothyroxine (SYNTHROID, LEVOTHROID) 112 MCG tablet Take 1 tablet (112 mcg total) by mouth daily. 90 tablet 3  . lisinopril (PRINIVIL,ZESTRIL) 40 MG tablet Take 1 tablet (40 mg total) by mouth daily. 90 tablet 3  . omeprazole (PRILOSEC) 20 MG capsule TAKE 1 CAPSULE DAILY 90 capsule 2  . simvastatin (ZOCOR) 40 MG tablet TAKE 1 TABLET AT BEDTIME 90 tablet 1   No current facility-administered medications on file prior to visit.     BP (!) 142/88 (BP Location: Left Arm, Patient Position: Sitting, Cuff Size: Normal)   Pulse 95   Temp 98.2 F (36.8 C) (Oral)   Wt 198 lb (89.8 kg)   SpO2 99%   BMI 36.21 kg/m       Objective:   Physical Exam  Constitutional: She is oriented to person, place, and time. She appears well-developed and well-nourished. She appears ill. No distress.  HENT:  Right Ear: Hearing, tympanic membrane, external ear and ear canal normal.  Left Ear: Hearing, tympanic membrane, external ear and ear canal normal.  Nose: Rhinorrhea present. No mucosal edema. Right sinus exhibits no maxillary sinus tenderness and no frontal sinus tenderness. Left sinus exhibits no maxillary sinus tenderness and no frontal sinus tenderness.  Mouth/Throat: Uvula is midline and mucous membranes are normal. Posterior  oropharyngeal erythema present. Tonsils are 0 on the right. Tonsils are 0 on the left. Tonsillar exudate.  + PND   Cardiovascular: Normal rate, regular rhythm, normal heart sounds and intact distal pulses.  Pulmonary/Chest: Effort normal and breath sounds normal.  Neurological: She is alert and oriented to person, place, and time.  Skin: Skin is warm and dry. She is not diaphoretic.  Psychiatric: She has a normal mood and affect. Her behavior is normal. Judgment and thought content normal.  Nursing note and vitals reviewed.     Assessment & Plan:  1. URI with cough and congestion - doxycycline (VIBRAMYCIN) 100 MG capsule; Take 1 capsule (100 mg total) by mouth 2 (two) times daily.  Dispense: 14 capsule; Refill: 0 - benzonatate (TESSALON) 200 MG capsule; Take 1 capsule (200 mg total) by mouth 2 (two) times daily as needed for cough.  Dispense: 20 capsule; Refill: 0 - POCT rapid strep A- negative  - Follow up in 2-3 days if no improvement   [name], NP

## 2018-04-18 ENCOUNTER — Ambulatory Visit (INDEPENDENT_AMBULATORY_CARE_PROVIDER_SITE_OTHER): Payer: Medicare Other

## 2018-04-18 DIAGNOSIS — Z23 Encounter for immunization: Secondary | ICD-10-CM | POA: Diagnosis not present

## 2018-06-06 ENCOUNTER — Ambulatory Visit (INDEPENDENT_AMBULATORY_CARE_PROVIDER_SITE_OTHER): Payer: Medicare Other | Admitting: Adult Health

## 2018-06-06 ENCOUNTER — Encounter: Payer: Self-pay | Admitting: Adult Health

## 2018-06-06 VITALS — BP 160/90 | HR 77 | Temp 97.6°F | Wt 196.0 lb

## 2018-06-06 DIAGNOSIS — R05 Cough: Secondary | ICD-10-CM | POA: Diagnosis not present

## 2018-06-06 DIAGNOSIS — R059 Cough, unspecified: Secondary | ICD-10-CM

## 2018-06-06 MED ORDER — BENZONATATE 200 MG PO CAPS: 200.0000 mg | ORAL_CAPSULE | Freq: Two times a day (BID) | ORAL | 2 refills | Status: DC | PRN

## 2018-06-06 NOTE — Progress Notes (Addendum)
Subjective:    Patient ID: LAKYA SCHRUPP, female    DOB: 1940/08/09, 77 y.o.   MRN: [id number]  Cough  This is a new problem. The current episode started more than 1 month ago. The problem has been gradually improving. The problem occurs constantly. The cough is productive of sputum. Associated symptoms include rhinorrhea and wheezing. Pertinent negatives include no chills, fever, headaches, nasal congestion, postnasal drip, sore throat or shortness of breath. The symptoms are aggravated by lying down. She has tried prescription cough suppressant (Tessalon ) for the symptoms. The treatment provided moderate relief. There is no history of asthma, bronchitis, COPD or pneumonia.      Review of Systems  Constitutional: Negative for appetite change, chills, fatigue and fever.  HENT: Positive for rhinorrhea. Negative for postnasal drip, sinus pressure, sinus pain and sore throat.   Respiratory: Positive for cough (semi productive ) and wheezing. Negative for chest tightness and shortness of breath.   Cardiovascular: Negative.   Musculoskeletal: Negative.   Neurological: Negative for headaches.  All other systems reviewed and are negative.  Past Medical History:  Diagnosis Date  . Allergy    no meds  . ANXIETY   . Cancer [facility])    cervical cancer - hysterectomy  . GERD   . Hyperlipidemia   . Hypertension   . Hypothyroid   . Plantar fasciitis    right  . RESTLESS LEGS SYNDROME     Social History   Socioeconomic History  . Marital status: Married    Spouse name: Not on file  . Number of children: 2  . Years of education: Not on file  . Highest education level: Not on file  Occupational History  . Not on file  Social Needs  . Financial resource strain: Not on file  . Food insecurity:    Worry: Not on file    Inability: Not on file  . Transportation needs:    Medical: Not on file    Non-medical: Not on file  Tobacco Use  . Smoking status: Never Smoker  . Smokeless  tobacco: Never Used  Substance and Sexual Activity  . Alcohol use: Yes    Alcohol/week: 5.0 standard drinks    Types: 5 Glasses of wine per week    Comment: Occasional  . Drug use: No  . Sexual activity: Yes    Partners: Male    Birth control/protection: Surgical, Other-see comments    Comment: hysterectomy  Lifestyle  . Physical activity:    Days per week: Not on file    Minutes per session: Not on file  . Stress: Not on file  Relationships  . Social connections:    Talks on phone: Not on file    Gets together: Not on file    Attends religious service: Not on file    Active member of club or organization: Not on file    Attends meetings of clubs or organizations: Not on file    Relationship status: Not on file  . Intimate partner violence:    Fear of current or ex partner: Not on file    Emotionally abused: Not on file    Physically abused: Not on file    Forced sexual activity: Not on file  Other Topics Concern  . Not on file  Social History Narrative   Married for 80 years   Two daughters, five grand children and 5 great grand children. Daughter lives in [place]. Other daughter lives in [place].  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    Exercise Does not exercise. Was going to exercise class but has since quite.    Diet: Veggies, chicken, salmon. Very little red meat. Eats lots of eggs.    Exercise --- walking , gym 3 classes a week    Past Surgical History:  Procedure Laterality Date  . ABDOMINAL HYSTERECTOMY    . BREAST SURGERY     Bilateral breast reduction  . COLOSTOMY  04-22-2010   [name]  . cone bx     cervix  . REDUCTION MAMMAPLASTY Bilateral   . WISDOM TOOTH EXTRACTION      Family History  Problem Relation Age of Onset  . Cancer Sister        melanoma- with mets  . Colon cancer Father 74  . Stomach cancer Maternal Grandfather   . Breast cancer Mother 35  . Breast cancer Daughter 67  . Rectal  cancer Neg Hx   . Esophageal cancer Neg Hx     Allergies  Allergen Reactions  . Amlodipine     Pt states it made her feel "awful and weird."  . Hydrocodone-Acetaminophen Swelling    rash  . Penicillins Other (See Comments)    SOB, red face  . Vicodin [Hydrocodone-Acetaminophen]     Red face, pt doesn't remember other symptoms    Current Outpatient Medications on File Prior to Visit  Medication Sig Dispense Refill  . Ascorbic Acid (VITAMIN C) 1000 MG tablet Take 1,000 mg by mouth daily.    Marland Kitchen b complex vitamins tablet Take 1 tablet by mouth daily.    . calcium carbonate (TUMS - DOSED IN MG ELEMENTAL CALCIUM) 500 MG chewable tablet Chew 1 tablet by mouth daily.    . Cholecalciferol (VITAMIN D) 2000 UNITS CAPS Take by mouth.    Marland Kitchen ibuprofen (ADVIL,MOTRIN) 600 MG tablet Take 600 mg by mouth at bedtime as needed.      Marland Kitchen levothyroxine (SYNTHROID, LEVOTHROID) 112 MCG tablet Take 1 tablet (112 mcg total) by mouth daily. 90 tablet 3  . lisinopril (PRINIVIL,ZESTRIL) 40 MG tablet Take 1 tablet (40 mg total) by mouth daily. 90 tablet 3  . omeprazole (PRILOSEC) 20 MG capsule TAKE 1 CAPSULE DAILY 90 capsule 2  . simvastatin (ZOCOR) 40 MG tablet TAKE 1 TABLET AT BEDTIME 90 tablet 1   No current facility-administered medications on file prior to visit.     BP (!) 160/90   Pulse 77   Temp 97.6 F (36.4 C)   Wt 196 lb (88.9 kg)   SpO2 98%   BMI 35.85 kg/m       Objective:   Physical Exam  Constitutional: She is oriented to person, place, and time. She appears well-developed and well-nourished. No distress.  HENT:  Head: Normocephalic and atraumatic.  Right Ear: External ear normal.  Left Ear: External ear normal.  Nose: Nose normal.  Mouth/Throat: Oropharynx is clear and moist. No oropharyngeal exudate.  Clear PND   Cardiovascular: Normal rate, regular rhythm, normal heart sounds and intact distal pulses.  Pulmonary/Chest: Effort normal. No stridor. She has wheezes (trace expiratory  wheezing ). She has no rales. She exhibits no tenderness.  Neurological: She is alert and oriented to person, place, and time.  Skin: Skin is warm and dry. She is not diaphoretic.  Psychiatric: She has a normal mood and affect. Her behavior is normal. Judgment and thought content normal.  Nursing note and vitals reviewed.     Assessment & Plan:  1. Cough - Likely bronchitis. She refuses prednisone at this time. Will send in prescription for Tessalon pearls. Breo ellipta sample given to be used once daily for 7 days  - benzonatate (TESSALON) 200 MG capsule; Take 1 capsule (200 mg total) by mouth 2 (two) times daily as needed for cough.  Dispense: 20 capsule; Refill: 2 - Follow up in one week if no improvement or sooner if symptoms become worse   [name], NP

## 2018-06-14 ENCOUNTER — Encounter: Payer: Self-pay | Admitting: Adult Health

## 2018-07-11 ENCOUNTER — Other Ambulatory Visit: Payer: Self-pay | Admitting: Adult Health

## 2018-07-11 DIAGNOSIS — K219 Gastro-esophageal reflux disease without esophagitis: Secondary | ICD-10-CM

## 2018-07-12 NOTE — Telephone Encounter (Signed)
Last CPX 07/26/17.  Needs to be scheduled.

## 2018-07-14 NOTE — Telephone Encounter (Signed)
Pt has been scheduled for her CPX on 07/28/2018.  Pt hopes refills can be sent

## 2018-07-14 NOTE — Telephone Encounter (Signed)
Sent to the pharmacy by e-scribe. 

## 2018-07-14 NOTE — Telephone Encounter (Signed)
Left a message for a return call.

## 2018-07-28 ENCOUNTER — Ambulatory Visit (INDEPENDENT_AMBULATORY_CARE_PROVIDER_SITE_OTHER): Payer: Medicare Other | Admitting: Adult Health

## 2018-07-28 ENCOUNTER — Encounter: Payer: Self-pay | Admitting: Adult Health

## 2018-07-28 ENCOUNTER — Other Ambulatory Visit: Payer: Self-pay | Admitting: Adult Health

## 2018-07-28 VITALS — BP 138/90 | HR 80 | Temp 97.7°F | Ht 61.5 in | Wt 196.6 lb

## 2018-07-28 DIAGNOSIS — E039 Hypothyroidism, unspecified: Secondary | ICD-10-CM | POA: Diagnosis not present

## 2018-07-28 DIAGNOSIS — I10 Essential (primary) hypertension: Secondary | ICD-10-CM

## 2018-07-28 DIAGNOSIS — E782 Mixed hyperlipidemia: Secondary | ICD-10-CM | POA: Diagnosis not present

## 2018-07-28 DIAGNOSIS — K219 Gastro-esophageal reflux disease without esophagitis: Secondary | ICD-10-CM

## 2018-07-28 LAB — CBC WITH DIFFERENTIAL/PLATELET
BASOS ABS: 0.1 10*3/uL (ref 0.0–0.1)
Basophils Relative: 0.8 % (ref 0.0–3.0)
Eosinophils Absolute: 0.3 10*3/uL (ref 0.0–0.7)
Eosinophils Relative: 3.9 % (ref 0.0–5.0)
HCT: 44.2 % (ref 36.0–46.0)
Hemoglobin: 14.2 g/dL (ref 12.0–15.0)
LYMPHS PCT: 33.4 % (ref 12.0–46.0)
Lymphs Abs: 2.9 10*3/uL (ref 0.7–4.0)
MCHC: 32 g/dL (ref 30.0–36.0)
MCV: 76.9 fl — ABNORMAL LOW (ref 78.0–100.0)
Monocytes Absolute: 0.9 10*3/uL (ref 0.1–1.0)
Monocytes Relative: 9.9 % (ref 3.0–12.0)
NEUTROS ABS: 4.5 10*3/uL (ref 1.4–7.7)
Neutrophils Relative %: 52 % (ref 43.0–77.0)
PLATELETS: 227 10*3/uL (ref 150.0–400.0)
RBC: 5.75 Mil/uL — ABNORMAL HIGH (ref 3.87–5.11)
RDW: 14.1 % (ref 11.5–15.5)
WBC: 8.6 10*3/uL (ref 4.0–10.5)

## 2018-07-28 LAB — LIPID PANEL
Cholesterol: 199 mg/dL (ref 0–200)
HDL: 54.6 mg/dL (ref >39.00)
LDL Cholesterol: 106 mg/dL — ABNORMAL HIGH (ref 0–99)
NonHDL: 144.61
Total CHOL/HDL Ratio: 4
Triglycerides: 195 mg/dL — ABNORMAL HIGH (ref 0.0–149.0)
VLDL: 39 mg/dL (ref 0.0–40.0)

## 2018-07-28 LAB — TSH: TSH: 1.15 u[IU]/mL (ref 0.35–4.50)

## 2018-07-28 LAB — COMPREHENSIVE METABOLIC PANEL
ALT: 26 U/L (ref 0–35)
AST: 25 U/L (ref 0–37)
Albumin: 4.6 g/dL (ref 3.5–5.2)
Alkaline Phosphatase: 62 U/L (ref 39–117)
BILIRUBIN TOTAL: 0.9 mg/dL (ref 0.2–1.2)
BUN: 22 mg/dL (ref 6–23)
CO2: 27 mEq/L (ref 19–32)
Calcium: 9.9 mg/dL (ref 8.4–10.5)
Chloride: 103 mEq/L (ref 96–112)
Creatinine, Ser: 1.12 mg/dL (ref 0.40–1.20)
GFR: 47.25 mL/min — ABNORMAL LOW (ref >60.00)
Glucose, Bld: 108 mg/dL — ABNORMAL HIGH (ref 70–99)
Potassium: 4.6 mEq/L (ref 3.5–5.1)
Sodium: 141 mEq/L (ref 135–145)
Total Protein: 7.4 g/dL (ref 6.0–8.3)

## 2018-07-28 MED ORDER — LEVOTHYROXINE SODIUM 112 MCG PO TABS: 112.0000 ug | ORAL_TABLET | Freq: Every day | ORAL | 3 refills | Status: DC

## 2018-07-28 MED ORDER — METHYLPREDNISOLONE 4 MG PO TBPK: ORAL_TABLET | ORAL | 0 refills | Status: DC

## 2018-07-28 MED ORDER — CYCLOBENZAPRINE HCL 5 MG PO TABS: 5.0000 mg | ORAL_TABLET | Freq: Every day | ORAL | 0 refills | Status: DC

## 2018-07-28 NOTE — Progress Notes (Signed)
Subjective:    Patient ID: Hannah Morrow, female    DOB: 1940-09-21, 78 y.o.   MRN: [id number]  HPI Patient presents for yearly preventative medicine examination. She is a pleasant 78 year old female who  has a past medical history of Allergy, ANXIETY, Cancer ([place]), GERD, Hyperlipidemia, Hypertension, Hypothyroid, Plantar fasciitis, and RESTLESS LEGS SYNDROME.   Essential Hypertension -  lisinopril 40 mg daily  BP Readings from Last 3 Encounters:  07/28/18 138/90  06/06/18 (!) 160/90  03/30/18 (!) 142/88   Hypothyroidism - takes synthroid 112 mcg   Hyperlipidemia - takes Zocor 40 mg QHS  Lab Results  Component Value Date   CHOL 173 07/26/2017   HDL 46.50 07/26/2017   LDLCALC 116 (H) 05/13/2014   LDLDIRECT 107.0 07/26/2017   TRIG 206.0 (H) 07/26/2017   CHOLHDL 4 07/26/2017   GERD - controlled with prilosec   All immunizations and health maintenance protocols were reviewed with the patient and she is up to date on routine vaccinations   Appropriate screening laboratory values were ordered for the patient including screening of hyperlipidemia, renal function and hepatic function.  Medication reconciliation,  past medical history, social history, problem list and allergies were reviewed in detail with the patient  Goals were established with regard to weight loss, exercise, and  diet in compliance with medications  End of life planning was discussed. She has an advanced directive   She is up to date on routine screening mammograms and colonoscopy.    Review of Systems  Constitutional: Negative.   HENT: Negative.   Eyes: Negative.   Respiratory: Negative.   Cardiovascular: Negative.   Gastrointestinal: Negative.   Endocrine: Negative.   Genitourinary: Negative.   Musculoskeletal: Positive for arthralgias and myalgias.  Allergic/Immunologic: Negative.   Neurological: Negative.   Hematological: Negative.   Psychiatric/Behavioral: Negative.   All other systems  reviewed and are negative.    Past Medical History:  Diagnosis Date  . Allergy    no meds  . ANXIETY   . Cancer [facility])    cervical cancer - hysterectomy  . GERD   . Hyperlipidemia   . Hypertension   . Hypothyroid   . Plantar fasciitis    right  . RESTLESS LEGS SYNDROME     Social History   Socioeconomic History  . Marital status: Married    Spouse name: Not on file  . Number of children: 2  . Years of education: Not on file  . Highest education level: Not on file  Occupational History  . Not on file  Social Needs  . Financial resource strain: Not on file  . Food insecurity:    Worry: Not on file    Inability: Not on file  . Transportation needs:    Medical: Not on file    Non-medical: Not on file  Tobacco Use  . Smoking status: Never Smoker  . Smokeless tobacco: Never Used  Substance and Sexual Activity  . Alcohol use: Yes    Alcohol/week: 5.0 standard drinks    Types: 5 Glasses of wine per week    Comment: Occasional  . Drug use: No  . Sexual activity: Yes    Partners: Male    Birth control/protection: Surgical, Other-see comments    Comment: hysterectomy  Lifestyle  . Physical activity:    Days per week: Not on file    Minutes per session: Not on file  . Stress: Not on file  Relationships  . Social connections:  Talks on phone: Not on file    Gets together: Not on file    Attends religious service: Not on file    Active member of club or organization: Not on file    Attends meetings of clubs or organizations: Not on file    Relationship status: Not on file  . Intimate partner violence:    Fear of current or ex partner: Not on file    Emotionally abused: Not on file    Physically abused: Not on file    Forced sexual activity: Not on file  Other Topics Concern  . Not on file  Social History Narrative   Married for 84 years   Two daughters, five grand children and 5 great grand children. Daughter lives in [place]. Other daughter lives in [place].      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    Exercise Does not exercise. Was going to exercise class but has since quite.    Diet: Veggies, chicken, salmon. Very little red meat. Eats lots of eggs.    Exercise --- walking , gym 3 classes a week    Past Surgical History:  Procedure Laterality Date  . ABDOMINAL HYSTERECTOMY    . BREAST SURGERY     Bilateral breast reduction  . COLOSTOMY  04-22-2010   [name]  . cone bx     cervix  . REDUCTION MAMMAPLASTY Bilateral   . WISDOM TOOTH EXTRACTION      Family History  Problem Relation Age of Onset  . Cancer Sister        melanoma- with mets  . Colon cancer Father 65  . Stomach cancer Maternal Grandfather   . Breast cancer Mother 58  . Breast cancer Daughter 37  . Rectal cancer Neg Hx   . Esophageal cancer Neg Hx     Allergies  Allergen Reactions  . Amlodipine     Pt states it made her feel "awful and weird."  . Hydrocodone-Acetaminophen Swelling    rash  . Penicillins Other (See Comments)    SOB, red face  . Vicodin [Hydrocodone-Acetaminophen]     Red face, pt doesn't remember other symptoms    Current Outpatient Medications on File Prior to Visit  Medication Sig Dispense Refill  . Ascorbic Acid (VITAMIN C) 1000 MG tablet Take 1,000 mg by mouth daily.    Marland Kitchen b complex vitamins tablet Take 1 tablet by mouth daily.    . benzonatate (TESSALON) 200 MG capsule Take 1 capsule (200 mg total) by mouth 2 (two) times daily as needed for cough. 20 capsule 2  . calcium carbonate (TUMS - DOSED IN MG ELEMENTAL CALCIUM) 500 MG chewable tablet Chew 1 tablet by mouth daily.    . Cholecalciferol (VITAMIN D) 2000 UNITS CAPS Take by mouth.    Marland Kitchen ibuprofen (ADVIL,MOTRIN) 600 MG tablet Take 600 mg by mouth at bedtime as needed.      Marland Kitchen levothyroxine (SYNTHROID, LEVOTHROID) 112 MCG tablet Take 1 tablet (112 mcg total) by mouth daily. 90 tablet 3  . lisinopril (PRINIVIL,ZESTRIL) 40 MG tablet Take  1 tablet (40 mg total) by mouth daily. 90 tablet 3  . omeprazole (PRILOSEC) 20 MG capsule TAKE 1 CAPSULE DAILY 90 capsule 0  . simvastatin (ZOCOR) 40 MG tablet TAKE 1 TABLET AT BEDTIME 90 tablet 1   No current facility-administered medications on file prior to visit.     BP 138/90 (BP  Location: Left Arm, Patient Position: Sitting, Cuff Size: Large)   Pulse 80   Temp 97.7 F (36.5 C) (Oral)   Ht 5' 1.5" (1.562 m)   Wt 196 lb 9.6 oz (89.2 kg)   SpO2 98%   BMI 36.55 kg/m       Objective:   Physical Exam Vitals signs and nursing note reviewed.  Constitutional:      Appearance: Normal appearance. She is obese.  HENT:     Head: Normocephalic and atraumatic.     Right Ear: Tympanic membrane, ear canal and external ear normal. There is no impacted cerumen.     Left Ear: Tympanic membrane, ear canal and external ear normal. There is no impacted cerumen.     Mouth/Throat:     Mouth: Mucous membranes are moist.     Pharynx: Oropharynx is clear.  Eyes:     General: No scleral icterus.       Right eye: No discharge.        Left eye: No discharge.     Extraocular Movements: Extraocular movements intact.     Conjunctiva/sclera: Conjunctivae normal.     Pupils: Pupils are equal, round, and reactive to light.  Neck:     Musculoskeletal: Normal range of motion and neck supple.  Cardiovascular:     Rate and Rhythm: Normal rate and regular rhythm.     Pulses: Normal pulses.     Heart sounds: Normal heart sounds. No murmur. No friction rub. No gallop.   Pulmonary:     Effort: Pulmonary effort is normal. No respiratory distress.     Breath sounds: Normal breath sounds. No stridor. No wheezing, rhonchi or rales.  Chest:     Chest wall: No tenderness.  Abdominal:     General: Abdomen is flat.  Musculoskeletal: Normal range of motion.        General: Tenderness (left knee) present. No swelling, deformity or signs of injury.     Right lower leg: No edema.     Left lower leg: No edema.    Skin:    General: Skin is warm and dry.     Capillary Refill: Capillary refill takes less than 2 seconds.     Coloration: Skin is not jaundiced or pale.     Findings: No bruising, erythema or rash.  Neurological:     General: No focal deficit present.     Mental Status: She is alert and oriented to person, place, and time.     Cranial Nerves: No cranial nerve deficit.     Sensory: No sensory deficit.     Motor: No weakness.     Coordination: Coordination normal.     Gait: Gait normal.  Psychiatric:        Mood and Affect: Mood normal.        Behavior: Behavior normal.        Thought Content: Thought content normal.        Judgment: Judgment normal.       Assessment & Plan:  1. Essential hypertension - Continue with lisinopril 40 mg  - Needs to work on weight loss through diet and exercise  - Follow up in one year or sooner if needed - CBC with Differential/Platelet - Lipid panel - TSH - Comprehensive metabolic panel  2. Mixed hyperlipidemia - Consider change in statin  - CBC with Differential/Platelet - Lipid panel - TSH - Comprehensive metabolic panel  3. Hypothyroidism, unspecified type - Consider dose change on synthroid  -  CBC with Differential/Platelet - Lipid panel - TSH - Comprehensive metabolic panel  4. Gastroesophageal reflux disease without esophagitis - Continue with prilosec   [name], NP

## 2018-07-31 ENCOUNTER — Encounter: Payer: Self-pay | Admitting: Adult Health

## 2018-08-11 ENCOUNTER — Other Ambulatory Visit: Payer: Self-pay | Admitting: Adult Health

## 2018-08-11 NOTE — Telephone Encounter (Signed)
Sent to the pharmacy by e-scribe. 

## 2018-09-23 ENCOUNTER — Other Ambulatory Visit: Payer: Self-pay | Admitting: Adult Health

## 2018-09-23 DIAGNOSIS — K219 Gastro-esophageal reflux disease without esophagitis: Secondary | ICD-10-CM

## 2018-10-27 ENCOUNTER — Other Ambulatory Visit: Payer: Self-pay | Admitting: Adult Health

## 2018-10-30 NOTE — Telephone Encounter (Signed)
Sent to the pharmacy by e-scribe. 

## 2018-11-07 ENCOUNTER — Ambulatory Visit: Payer: Medicare Other

## 2019-01-03 ENCOUNTER — Encounter: Payer: Self-pay | Admitting: Adult Health

## 2019-01-03 ENCOUNTER — Other Ambulatory Visit: Payer: Self-pay | Admitting: Adult Health

## 2019-01-03 DIAGNOSIS — Z1231 Encounter for screening mammogram for malignant neoplasm of breast: Secondary | ICD-10-CM

## 2019-01-23 ENCOUNTER — Ambulatory Visit: Payer: Self-pay | Admitting: *Deleted

## 2019-01-23 ENCOUNTER — Encounter: Payer: Self-pay | Admitting: Adult Health

## 2019-01-23 NOTE — Telephone Encounter (Signed)
Message  So I called 2 Urgent Care places with no answer so I went to one on [address] and it was packed with regular appointments so I came back home! I'll call in the morning for an appointment with [facility]!

## 2019-01-23 NOTE — Telephone Encounter (Signed)
Spoke to the pt.  She stated she is feeling much better.  Asked if her bp machine was broken as she was getting the error messages. She stated she doesn't think she is using it correctly and it hurts her arm when it squeezes her.  She has chosen not to use it.  She has declined to go to local urgent care.  Advised that I would like for her to be seen in the office tomorrow.  Placed in 7:30 AM time slot to see [facility].  Will forward as FYI.

## 2019-01-23 NOTE — Telephone Encounter (Signed)
Pt called with her last b/p was really high and having an  irregular HB . C/o having a funny tingling feeling in her chest. She denies pain or shortness of breath. B/P was 179/110 a couple of weeks ago. When she tries to use her monitor, it is giving her an error message so she has not checked her b/p since a couple weeks ago. She is on lisinopril and has not missed any doses.  She denies swelling in her ankles or dizziness.  She is advised to go to an urgent care, to have her b/p and heart rate check.  She is requesting an appointment. Advised that she should be seen more urgently, not knowing what her v/s are. She voiced understanding and will be going to a [facility].  Notified flow at [facility] regarding pt to be seen at urgent care. Routing to the practice for review.  Reason for Disposition . Age > 60 years (Exception: brief heart beat symptoms that went away and now feels well)  Answer Assessment - Initial Assessment Questions 1. DESCRIPTION: "Please describe your heart rate or heart beat that you are having" (e.g., fast/slow, regular/irregular, skipped or extra beats, "palpitation Irregular  2. ONSET: "When did it start?" (Minutes, hours or days)      About 3 weeks ago 3. DURATION: "How long does it last" (e.g., seconds, minutes, hours)     All day 4. PATTERN "Does it come and go, or has it been constant since it started?"  "Does it get worse with exertion?"   "Are you feeling it now?"     Comes and goes 5. TAP: "Using your hand, can you tap out what you are feeling on a chair or table in front of you, so that I can hear?" (Note: not all patients can do this)       n/a 6. HEART RATE: "Can you tell me your heart rate?" "How many beats in 15 seconds?"  (Note: not all patients can do this)       80 now 7. RECURRENT SYMPTOM: "Have you ever had this before?" If so, ask: "When was the last time?" and "What happened that time?"      yes 8. CAUSE: "What do you think is  causing the palpitations?"     Not sure, maybe anxiety 9. CARDIAC HISTORY: "Do you have any history of heart disease?" (e.g., heart attack, angina, bypass surgery, angioplasty, arrhythmia)      no 10. OTHER SYMPTOMS: "Do you have any other symptoms?" (e.g., dizziness, chest pain, sweating, difficulty breathing)       no 11. PREGNANCY: "Is there any chance you are pregnant?" "When was your last menstrual period?"       n/a  Protocols used: HEART RATE AND HEARTBEAT QUESTIONS-A-AH

## 2019-01-23 NOTE — Telephone Encounter (Signed)
Tried to reach the pt by telephone.  Left a message for a return call.

## 2019-01-24 ENCOUNTER — Ambulatory Visit (INDEPENDENT_AMBULATORY_CARE_PROVIDER_SITE_OTHER): Payer: Medicare Other | Admitting: Adult Health

## 2019-01-24 ENCOUNTER — Encounter: Payer: Self-pay | Admitting: Adult Health

## 2019-01-24 ENCOUNTER — Other Ambulatory Visit: Payer: Self-pay

## 2019-01-24 VITALS — BP 160/100 | Temp 97.8°F | Wt 193.0 lb

## 2019-01-24 DIAGNOSIS — R002 Palpitations: Secondary | ICD-10-CM | POA: Diagnosis not present

## 2019-01-24 DIAGNOSIS — I10 Essential (primary) hypertension: Secondary | ICD-10-CM

## 2019-01-24 LAB — POCT URINALYSIS DIPSTICK
Bilirubin, UA: NEGATIVE
Blood, UA: NEGATIVE
Glucose, UA: NEGATIVE
Ketones, UA: NEGATIVE
Leukocytes, UA: NEGATIVE
Nitrite, UA: NEGATIVE
Odor: NEGATIVE
Protein, UA: NEGATIVE
Spec Grav, UA: 1.025 (ref 1.010–1.025)
Urobilinogen, UA: 0.2 E.U./dL
pH, UA: 5.5 (ref 5.0–8.0)

## 2019-01-24 LAB — BASIC METABOLIC PANEL
BUN: 22 mg/dL (ref 6–23)
CO2: 26 mEq/L (ref 19–32)
Calcium: 9.8 mg/dL (ref 8.4–10.5)
Chloride: 106 mEq/L (ref 96–112)
Creatinine, Ser: 1.15 mg/dL (ref 0.40–1.20)
GFR: 45.77 mL/min — ABNORMAL LOW (ref >60.00)
Glucose, Bld: 99 mg/dL (ref 70–99)
Potassium: 4.6 mEq/L (ref 3.5–5.1)
Sodium: 141 mEq/L (ref 135–145)

## 2019-01-24 MED ORDER — CARVEDILOL 6.25 MG PO TABS: 6.2500 mg | ORAL_TABLET | Freq: Two times a day (BID) | ORAL | 0 refills | Status: DC

## 2019-01-24 NOTE — Patient Instructions (Addendum)
Your EKG is normal.   I will follow up with you regarding blood work   I have prescribed a new blood pressure medication to take with Lisinopril   Please follow up in 7 days via virtual visit

## 2019-01-24 NOTE — Progress Notes (Signed)
Subjective:    Patient ID: Hannah Morrow, female    DOB: Feb 27, 1941, 78 y.o.   MRN: [id number]  HPI 78 year old female who  has a past medical history of Allergy, ANXIETY, Cancer ([place]), GERD, Hyperlipidemia, Hypertension, Hypothyroid, Plantar fasciitis, and RESTLESS LEGS SYNDROME.  She presents to the office today for concern of elevation in blood pressure readings at home. She is currently prescribed lisinopril 40 mg daily. She has been intolerant to many blood pressure medications in the past including Norvasc( made her feel "awful"), Metoprolol ( " feel weird"). Over the last few weeks she has been noticing elevated readings between 160-190/80-110; she also feels as though she is having issues with " irregular heart beat" - which she has had in the past. She is symptomatic with a " feeling like my head is full".   She does not add salt to her diet and is trying to eat healthy   She denies CP or SOB  BP Readings from Last 3 Encounters:  01/24/19 (!) 160/100  07/28/18 138/90  06/06/18 (!) 160/90    Review of Systems See HPI   Past Medical History:  Diagnosis Date  . Allergy    no meds  . ANXIETY   . Cancer [facility])    cervical cancer - hysterectomy  . GERD   . Hyperlipidemia   . Hypertension   . Hypothyroid   . Plantar fasciitis    right  . RESTLESS LEGS SYNDROME     Social History   Socioeconomic History  . Marital status: Married    Spouse name: Not on file  . Number of children: 2  . Years of education: Not on file  . Highest education level: Not on file  Occupational History  . Not on file  Social Needs  . Financial resource strain: Not on file  . Food insecurity    Worry: Not on file    Inability: Not on file  . Transportation needs    Medical: Not on file    Non-medical: Not on file  Tobacco Use  . Smoking status: Never Smoker  . Smokeless tobacco: Never Used  Substance and Sexual Activity  . Alcohol use: Yes    Alcohol/week: 5.0 standard drinks     Types: 5 Glasses of wine per week    Comment: Occasional  . Drug use: No  . Sexual activity: Yes    Partners: Male    Birth control/protection: Surgical, Other-see comments    Comment: hysterectomy  Lifestyle  . Physical activity    Days per week: Not on file    Minutes per session: Not on file  . Stress: Not on file  Relationships  . Social Herbalist on phone: Not on file    Gets together: Not on file    Attends religious service: Not on file    Active member of club or organization: Not on file    Attends meetings of clubs or organizations: Not on file    Relationship status: Not on file  . Intimate partner violence    Fear of current or ex partner: Not on file    Emotionally abused: Not on file    Physically abused: Not on file    Forced sexual activity: Not on file  Other Topics Concern  . Not on file  Social History Narrative   Married for 45 years   Two daughters, five grand children and 5 great grand children. Daughter lives in  [place]. Other daughter lives in [place].    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    Exercise Does not exercise. Was going to exercise class but has since quite.    Diet: Veggies, chicken, salmon. Very little red meat. Eats lots of eggs.    Exercise --- walking , gym 3 classes a week    Past Surgical History:  Procedure Laterality Date  . ABDOMINAL HYSTERECTOMY    . BREAST SURGERY     Bilateral breast reduction  . COLOSTOMY  04-22-2010   [name]  . cone bx     cervix  . REDUCTION MAMMAPLASTY Bilateral   . WISDOM TOOTH EXTRACTION      Family History  Problem Relation Age of Onset  . Cancer Sister        melanoma- with mets  . Colon cancer Father 60  . Stomach cancer Maternal Grandfather   . Breast cancer Mother 43  . Breast cancer Daughter 29  . Rectal cancer Neg Hx   . Esophageal cancer Neg Hx     Allergies  Allergen Reactions  . Amlodipine     Pt states it  made her feel "awful and weird."  . Hydrocodone-Acetaminophen Swelling    rash  . Metoprolol Other (See Comments)    " feel weird"  . Penicillins Other (See Comments)    SOB, red face  . Vicodin [Hydrocodone-Acetaminophen]     Red face, pt doesn't remember other symptoms    Current Outpatient Medications on File Prior to Visit  Medication Sig Dispense Refill  . Ascorbic Acid (VITAMIN C) 1000 MG tablet Take 1,000 mg by mouth daily.    Marland Kitchen b complex vitamins tablet Take 1 tablet by mouth daily.    . calcium carbonate (TUMS - DOSED IN MG ELEMENTAL CALCIUM) 500 MG chewable tablet Chew 1 tablet by mouth daily.    . Cholecalciferol (VITAMIN D) 2000 UNITS CAPS Take by mouth.    Marland Kitchen ibuprofen (ADVIL,MOTRIN) 600 MG tablet Take 600 mg by mouth at bedtime as needed.      Marland Kitchen LEVOXYL 112 MCG tablet TAKE 1 TABLET DAILY 90 tablet 3  . lisinopril (PRINIVIL,ZESTRIL) 40 MG tablet TAKE 1 TABLET DAILY 90 tablet 3  . omeprazole (PRILOSEC) 20 MG capsule TAKE 1 CAPSULE DAILY 90 capsule 3  . simvastatin (ZOCOR) 40 MG tablet TAKE 1 TABLET AT BEDTIME 90 tablet 2   No current facility-administered medications on file prior to visit.     BP (!) 160/100   Temp 97.8 F (36.6 C)   Wt 193 lb (87.5 kg)   BMI 35.88 kg/m       Objective:   Physical Exam Vitals signs and nursing note reviewed.  Constitutional:      Appearance: Normal appearance.  Eyes:     Extraocular Movements: Extraocular movements intact.  Cardiovascular:     Rate and Rhythm: Normal rate and regular rhythm.     Pulses: Normal pulses.     Heart sounds: Normal heart sounds.  Pulmonary:     Effort: Pulmonary effort is normal.     Breath sounds: Normal breath sounds.  Musculoskeletal: Normal range of motion.  Skin:    General: Skin is warm.     Capillary Refill: Capillary refill takes less than 2 seconds.  Neurological:     General: No focal deficit present.     Mental Status: She is alert.  Psychiatric:  Mood and Affect: Mood  normal.        Behavior: Behavior normal.        Thought Content: Thought content normal.        Judgment: Judgment normal.       Assessment & Plan:  1. Essential hypertension - Will add low dose Coreg.  - Basic Metabolic Panel - carvedilol (COREG) 6.25 MG tablet; Take 1 tablet (6.25 mg total) by mouth 2 (two) times daily with a meal.  Dispense: 60 tablet; Refill: 0 - POC Urinalysis Dipstick - Follow up in 7 days via virtual visit  2. Palpitations  - Basic Metabolic Panel - EKG [id number] Sinus  Rhythm  Low voltage in precordial leads.   -Poor R-wave progression -may be secondary to pulmonary disease   consider old anterior infarct. Rate 72 - no change  - carvedilol (COREG) 6.25 MG tablet; Take 1 tablet (6.25 mg total) by mouth 2 (two) times daily with a meal.  Dispense: 60 tablet; Refill: 0  [name], NP

## 2019-01-30 ENCOUNTER — Encounter: Payer: Self-pay | Admitting: Adult Health

## 2019-01-31 ENCOUNTER — Ambulatory Visit: Payer: Medicare Other | Admitting: Adult Health

## 2019-01-31 ENCOUNTER — Encounter: Payer: Self-pay | Admitting: Adult Health

## 2019-01-31 NOTE — Telephone Encounter (Signed)
Pt now scheduled on 02/06/2019

## 2019-01-31 NOTE — Progress Notes (Deleted)
Virtual Visit via Video Note  I connected with Hannah Morrow on 01/31/19 at  1:30 PM EDT by a video enabled telemedicine application and verified that I am speaking with the correct person using two identifiers.  Location patient: home Location provider:work or home office Persons participating in the virtual visit: patient, provider  I discussed the limitations of evaluation and management by telemedicine and the availability of in person appointments. The patient expressed understanding and agreed to proceed.   HPI: 78 year old female who is being evaluated today for follow-up regarding hypertension.  She was last seen about 1 week ago she noticed her blood pressure readings being elevated at home.  She was getting readings of 160-190/80-110.  This time she also felt as though she was having issues with an irregular heartbeat, which she has had in the past.  Is currently on lisinopril 40 mg.  She has had reactions to Norvasc and metoprolol in the past we decided on trying low-dose Coreg.  Since starting Coreg she reports blood pressure readings between 133 and 172 over 70s.  She did have repeat one reading of 236/56 but I think this is an error.   ROS: See pertinent positives and negatives per HPI.  Past Medical History:  Diagnosis Date  . Allergy    no meds  . ANXIETY   . Cancer [facility])    cervical cancer - hysterectomy  . GERD   . Hyperlipidemia   . Hypertension   . Hypothyroid   . Plantar fasciitis    right  . RESTLESS LEGS SYNDROME     Past Surgical History:  Procedure Laterality Date  . ABDOMINAL HYSTERECTOMY    . BREAST SURGERY     Bilateral breast reduction  . COLOSTOMY  04-22-2010   [name]  . cone bx     cervix  . REDUCTION MAMMAPLASTY Bilateral   . WISDOM TOOTH EXTRACTION      Family History  Problem Relation Age of Onset  . Cancer Sister        melanoma- with mets  . Colon cancer Father 79  . Stomach cancer Maternal Grandfather   . Breast cancer  Mother 68  . Breast cancer Daughter 41  . Rectal cancer Neg Hx   . Esophageal cancer Neg Hx     SOCIAL HX: ***   Current Outpatient Medications:  .  Ascorbic Acid (VITAMIN C) 1000 MG tablet, Take 1,000 mg by mouth daily., Disp: , Rfl:  .  b complex vitamins tablet, Take 1 tablet by mouth daily., Disp: , Rfl:  .  calcium carbonate (TUMS - DOSED IN MG ELEMENTAL CALCIUM) 500 MG chewable tablet, Chew 1 tablet by mouth daily., Disp: , Rfl:  .  carvedilol (COREG) 6.25 MG tablet, Take 1 tablet (6.25 mg total) by mouth 2 (two) times daily with a meal., Disp: 60 tablet, Rfl: 0 .  Cholecalciferol (VITAMIN D) 2000 UNITS CAPS, Take by mouth., Disp: , Rfl:  .  ibuprofen (ADVIL,MOTRIN) 600 MG tablet, Take 600 mg by mouth at bedtime as needed.  , Disp: , Rfl:  .  LEVOXYL 112 MCG tablet, TAKE 1 TABLET DAILY, Disp: 90 tablet, Rfl: 3 .  lisinopril (PRINIVIL,ZESTRIL) 40 MG tablet, TAKE 1 TABLET DAILY, Disp: 90 tablet, Rfl: 3 .  omeprazole (PRILOSEC) 20 MG capsule, TAKE 1 CAPSULE DAILY, Disp: 90 capsule, Rfl: 3 .  simvastatin (ZOCOR) 40 MG tablet, TAKE 1 TABLET AT BEDTIME, Disp: 90 tablet, Rfl: 2  EXAM:  VITALS per patient if applicable:  GENERAL: alert, oriented, appears well and in no acute distress  HEENT: atraumatic, conjunttiva clear, no obvious abnormalities on inspection of external nose and ears  NECK: normal movements of the head and neck  LUNGS: on inspection no signs of respiratory distress, breathing rate appears normal, no obvious gross SOB, gasping or wheezing  CV: no obvious cyanosis  MS: moves all visible extremities without noticeable abnormality  PSYCH/NEURO: pleasant and cooperative, no obvious depression or anxiety, speech and thought processing grossly intact  ASSESSMENT AND PLAN:  Discussed the following assessment and plan:  No diagnosis found.     I discussed the assessment and treatment plan with the patient. The patient was provided an opportunity to ask questions  and all were answered. The patient agreed with the plan and demonstrated an understanding of the instructions.   The patient was advised to call back or seek an in-person evaluation if the symptoms worsen or if the condition fails to improve as anticipated.   [name], NP

## 2019-02-06 ENCOUNTER — Encounter: Payer: Self-pay | Admitting: Adult Health

## 2019-02-06 ENCOUNTER — Ambulatory Visit (INDEPENDENT_AMBULATORY_CARE_PROVIDER_SITE_OTHER): Payer: Medicare Other | Admitting: Adult Health

## 2019-02-06 ENCOUNTER — Other Ambulatory Visit: Payer: Self-pay

## 2019-02-06 DIAGNOSIS — I10 Essential (primary) hypertension: Secondary | ICD-10-CM

## 2019-02-06 DIAGNOSIS — R002 Palpitations: Secondary | ICD-10-CM | POA: Diagnosis not present

## 2019-02-06 NOTE — Progress Notes (Signed)
Virtual Visit via Video Note  I connected with Hannah Morrow  on 02/06/19 at  3:00 PM EDT by a video enabled telemedicine application and verified that I am speaking with the correct person using two identifiers.  Location patient: home Location provider:work or home office Persons participating in the virtual visit: patient, provider  I discussed the limitations of evaluation and management by telemedicine and the availability of in person appointments. The patient expressed understanding and agreed to proceed.   HPI: Been a 78-year-old female who is being evaluated today for follow-up regarding essential hypertension.  Was originally seen on 01/24/2019 after she noted elevated blood pressure readings between 160 and 190 over 80s to 100s.  She was also having issues with palpitations, which she has had for multiple years in the past.  Time she was on lisinopril 40 mg daily and Coreg 6.25 was added to her regimen.  The first week that she was on this medication her blood pressure stayed in the 160s to 180s, but over the last week her blood pressures have been more consistently in the low 140s with the lowest being 137 over 70s to 80s.  He continues to have the feeling of palpitations but this is also diminished.  She denies any side effects  ROS: See pertinent positives and negatives per HPI.  Past Medical History:  Diagnosis Date  . Allergy    no meds  . ANXIETY   . Cancer [facility])    cervical cancer - hysterectomy  . GERD   . Hyperlipidemia   . Hypertension   . Hypothyroid   . Plantar fasciitis    right  . RESTLESS LEGS SYNDROME     Past Surgical History:  Procedure Laterality Date  . ABDOMINAL HYSTERECTOMY    . BREAST SURGERY     Bilateral breast reduction  . COLOSTOMY  04-22-2010   [name]  . cone bx     cervix  . REDUCTION MAMMAPLASTY Bilateral   . WISDOM TOOTH EXTRACTION      Family History  Problem Relation Age of Onset  . Cancer Sister        melanoma- with mets   . Colon cancer Father 85  . Stomach cancer Maternal Grandfather   . Breast cancer Mother 65  . Breast cancer Daughter 40  . Rectal cancer Neg Hx   . Esophageal cancer Neg Hx       Current Outpatient Medications:  .  Ascorbic Acid (VITAMIN C) 1000 MG tablet, Take 1,000 mg by mouth daily., Disp: , Rfl:  .  b complex vitamins tablet, Take 1 tablet by mouth daily., Disp: , Rfl:  .  calcium carbonate (TUMS - DOSED IN MG ELEMENTAL CALCIUM) 500 MG chewable tablet, Chew 1 tablet by mouth daily., Disp: , Rfl:  .  carvedilol (COREG) 6.25 MG tablet, Take 1 tablet (6.25 mg total) by mouth 2 (two) times daily with a meal., Disp: 60 tablet, Rfl: 0 .  Cholecalciferol (VITAMIN D) 2000 UNITS CAPS, Take by mouth., Disp: , Rfl:  .  ibuprofen (ADVIL,MOTRIN) 600 MG tablet, Take 600 mg by mouth at bedtime as needed.  , Disp: , Rfl:  .  LEVOXYL 112 MCG tablet, TAKE 1 TABLET DAILY, Disp: 90 tablet, Rfl: 3 .  lisinopril (PRINIVIL,ZESTRIL) 40 MG tablet, TAKE 1 TABLET DAILY, Disp: 90 tablet, Rfl: 3 .  omeprazole (PRILOSEC) 20 MG capsule, TAKE 1 CAPSULE DAILY, Disp: 90 capsule, Rfl: 3 .  simvastatin (ZOCOR) 40 MG tablet, TAKE 1 TABLET AT  BEDTIME, Disp: 90 tablet, Rfl: 2  EXAM:  VITALS per patient if applicable:  GENERAL: alert, oriented, appears well and in no acute distress  HEENT: atraumatic, conjunttiva clear, no obvious abnormalities on inspection of external nose and ears  NECK: normal movements of the head and neck  LUNGS: on inspection no signs of respiratory distress, breathing rate appears normal, no obvious gross SOB, gasping or wheezing  CV: no obvious cyanosis  MS: moves all visible extremities without noticeable abnormality  PSYCH/NEURO: pleasant and cooperative, no obvious depression or anxiety, speech and thought processing grossly intact  ASSESSMENT AND PLAN:  Discussed the following assessment and plan:  1. Essential hypertension -Blood pressure becoming more consistent.  We will  keep her on lisinopril and current dose of Coreg.  She will monitor her blood pressures for another week and send them to me through my chart.  At this time we can consider increasing dose of Coreg  2. Palpitations -Improved with Coreg  I discussed the assessment and treatment plan with the patient. The patient was provided an opportunity to ask questions and all were answered. The patient agreed with the plan and demonstrated an understanding of the instructions.   The patient was advised to call back or seek an in-person evaluation if the symptoms worsen or if the condition fails to improve as anticipated.   [name], NP

## 2019-02-13 ENCOUNTER — Other Ambulatory Visit: Payer: Self-pay | Admitting: Adult Health

## 2019-02-13 ENCOUNTER — Encounter: Payer: Self-pay | Admitting: Adult Health

## 2019-02-13 DIAGNOSIS — I10 Essential (primary) hypertension: Secondary | ICD-10-CM

## 2019-02-13 DIAGNOSIS — R002 Palpitations: Secondary | ICD-10-CM

## 2019-02-13 MED ORDER — CARVEDILOL 12.5 MG PO TABS: 12.5000 mg | ORAL_TABLET | Freq: Two times a day (BID) | ORAL | 0 refills | Status: DC

## 2019-02-20 ENCOUNTER — Other Ambulatory Visit: Payer: Self-pay

## 2019-02-20 ENCOUNTER — Ambulatory Visit
Admission: RE | Admit: 2019-02-20 | Discharge: 2019-02-20 | Disposition: A | Discharge status: Home / Self Care | Payer: Medicare Other | Source: Ambulatory Visit | Attending: Adult Health | Admitting: Adult Health

## 2019-02-20 DIAGNOSIS — Z1231 Encounter for screening mammogram for malignant neoplasm of breast: Secondary | ICD-10-CM

## 2019-02-21 ENCOUNTER — Other Ambulatory Visit: Payer: Self-pay | Admitting: Adult Health

## 2019-02-21 ENCOUNTER — Encounter: Payer: Self-pay | Admitting: Adult Health

## 2019-02-21 DIAGNOSIS — R928 Other abnormal and inconclusive findings on diagnostic imaging of breast: Secondary | ICD-10-CM

## 2019-02-23 ENCOUNTER — Ambulatory Visit
Admission: RE | Admit: 2019-02-23 | Discharge: 2019-02-23 | Disposition: A | Discharge status: Home / Self Care | Payer: Medicare Other | Source: Ambulatory Visit | Attending: Adult Health | Admitting: Adult Health

## 2019-02-23 ENCOUNTER — Other Ambulatory Visit: Payer: Self-pay | Admitting: Adult Health

## 2019-02-23 ENCOUNTER — Other Ambulatory Visit: Payer: Self-pay

## 2019-02-23 DIAGNOSIS — N6489 Other specified disorders of breast: Secondary | ICD-10-CM | POA: Diagnosis not present

## 2019-02-23 DIAGNOSIS — R928 Other abnormal and inconclusive findings on diagnostic imaging of breast: Secondary | ICD-10-CM

## 2019-02-28 DIAGNOSIS — L821 Other seborrheic keratosis: Secondary | ICD-10-CM | POA: Diagnosis not present

## 2019-02-28 DIAGNOSIS — Z85828 Personal history of other malignant neoplasm of skin: Secondary | ICD-10-CM | POA: Diagnosis not present

## 2019-02-28 DIAGNOSIS — L570 Actinic keratosis: Secondary | ICD-10-CM | POA: Diagnosis not present

## 2019-02-28 DIAGNOSIS — L814 Other melanin hyperpigmentation: Secondary | ICD-10-CM | POA: Diagnosis not present

## 2019-02-28 DIAGNOSIS — L820 Inflamed seborrheic keratosis: Secondary | ICD-10-CM | POA: Diagnosis not present

## 2019-03-14 ENCOUNTER — Other Ambulatory Visit: Payer: Self-pay | Admitting: Adult Health

## 2019-03-14 ENCOUNTER — Encounter: Payer: Self-pay | Admitting: Adult Health

## 2019-03-14 DIAGNOSIS — R002 Palpitations: Secondary | ICD-10-CM

## 2019-03-14 DIAGNOSIS — I10 Essential (primary) hypertension: Secondary | ICD-10-CM

## 2019-03-28 ENCOUNTER — Encounter: Payer: Self-pay | Admitting: Adult Health

## 2019-03-28 DIAGNOSIS — Z23 Encounter for immunization: Secondary | ICD-10-CM | POA: Diagnosis not present

## 2019-04-12 DIAGNOSIS — H40033 Anatomical narrow angle, bilateral: Secondary | ICD-10-CM | POA: Diagnosis not present

## 2019-04-12 DIAGNOSIS — H2513 Age-related nuclear cataract, bilateral: Secondary | ICD-10-CM | POA: Diagnosis not present

## 2019-04-12 DIAGNOSIS — H5203 Hypermetropia, bilateral: Secondary | ICD-10-CM | POA: Diagnosis not present

## 2019-04-17 ENCOUNTER — Ambulatory Visit: Payer: Medicare Other

## 2019-05-03 ENCOUNTER — Telehealth: Payer: Self-pay | Admitting: Adult Health

## 2019-05-03 NOTE — Telephone Encounter (Signed)
Patient is scheduled for 05/04/2019 at 11:30 AM

## 2019-05-03 NOTE — Telephone Encounter (Signed)
Can come in the next open in office slot or virtual.  Up to her.

## 2019-05-03 NOTE — Telephone Encounter (Signed)
The patient is having heart burn/tired lately don't know if it is from her new medication/ache all over.   I was trying to schedule her for a virtual visit but she is wanting to come in office.  Please advise

## 2019-05-03 NOTE — Telephone Encounter (Signed)
LMVM for the patient to contact our office to schedule a virtual visit. Hannah Morrow's next in office appointment is not til next Wednesday at 2:30.  Please advise

## 2019-05-04 ENCOUNTER — Ambulatory Visit (INDEPENDENT_AMBULATORY_CARE_PROVIDER_SITE_OTHER): Payer: Medicare Other | Admitting: Adult Health

## 2019-05-04 ENCOUNTER — Other Ambulatory Visit: Payer: Self-pay

## 2019-05-04 ENCOUNTER — Encounter: Payer: Self-pay | Admitting: Adult Health

## 2019-05-04 VITALS — BP 132/80 | Temp 97.3°F | Wt 194.0 lb

## 2019-05-04 DIAGNOSIS — R079 Chest pain, unspecified: Secondary | ICD-10-CM | POA: Diagnosis not present

## 2019-05-04 DIAGNOSIS — R5383 Other fatigue: Secondary | ICD-10-CM | POA: Diagnosis not present

## 2019-05-04 NOTE — Progress Notes (Signed)
Subjective:    Patient ID: Hannah Morrow, female    DOB: [birth year], 78 y.o.   MRN: [id number]  HPI  78 year old female who  has a past medical history of Allergy, ANXIETY, Cancer ([place]), GERD, Hyperlipidemia, Hypertension, Hypothyroid, Plantar fasciitis, and RESTLESS LEGS SYNDROME.  She presents to the office today for the acute complaint of chest pain ache and fatigue.  She reports that over the last 2 weeks she has felt increasing fatigue throughout the day.  Last week there is episodes where she did not want to get out of bed in the morning, felt as she had no energy her in Tuesday has not..  Today she feels back to baseline.  Denies depression  She also reports chest pain over the last 3 days.  Patient states "it was so bad that I almost called the ambulance to take me to the hospital".  Pain is midsternal but radiated across the chest.  No radiating pain down left arm or up jawline.  2 days ago she was outside walking and the pain got to be the worst that it had been.  This pain lasted until she got home was able to sit down.  She does endorse very mild chest pain before going outside.  When she woke up this morning to go on a walk she had no chest pain   She reports that since starting Coreg about two months ago she has not had any palpitations.  Review of Systems See HPI   Past Medical History:  Diagnosis Date  . Allergy    no meds  . ANXIETY   . Cancer [facility])    cervical cancer - hysterectomy  . GERD   . Hyperlipidemia   . Hypertension   . Hypothyroid   . Plantar fasciitis    right  . RESTLESS LEGS SYNDROME     Social History   Socioeconomic History  . Marital status: Married    Spouse name: Not on file  . Number of children: 2  . Years of education: Not on file  . Highest education level: Not on file  Occupational History  . Not on file  Social Needs  . Financial resource strain: Not on file  . Food insecurity    Worry: Not on file    Inability: Not  on file  . Transportation needs    Medical: Not on file    Non-medical: Not on file  Tobacco Use  . Smoking status: Never Smoker  . Smokeless tobacco: Never Used  Substance and Sexual Activity  . Alcohol use: Yes    Alcohol/week: 5.0 standard drinks    Types: 5 Glasses of wine per week    Comment: Occasional  . Drug use: No  . Sexual activity: Yes    Partners: Male    Birth control/protection: Surgical, Other-see comments    Comment: hysterectomy  Lifestyle  . Physical activity    Days per week: Not on file    Minutes per session: Not on file  . Stress: Not on file  Relationships  . Social Herbalist on phone: Not on file    Gets together: Not on file    Attends religious service: Not on file    Active member of club or organization: Not on file    Attends meetings of clubs or organizations: Not on file    Relationship status: Not on file  . Intimate partner violence    Fear of current  or ex partner: Not on file    Emotionally abused: Not on file    Physically abused: Not on file    Forced sexual activity: Not on file  Other Topics Concern  . Not on file  Social History Narrative   Married for 69 years   Two daughters, five grand children and 5 great grand children. Daughter lives in [place]. Other daughter lives in [place].    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    Exercise Does not exercise. Was going to exercise class but has since quite.    Diet: Veggies, chicken, salmon. Very little red meat. Eats lots of eggs.    Exercise --- walking , gym 3 classes a week    Past Surgical History:  Procedure Laterality Date  . ABDOMINAL HYSTERECTOMY    . BREAST SURGERY     Bilateral breast reduction  . COLOSTOMY  04-22-2010   [name]  . cone bx     cervix  . REDUCTION MAMMAPLASTY Bilateral   . WISDOM TOOTH EXTRACTION      Family History  Problem Relation Age of Onset  . Cancer Sister        melanoma-  with mets  . Colon cancer Father 53  . Stomach cancer Maternal Grandfather   . Breast cancer Mother 71  . Breast cancer Daughter 76  . Rectal cancer Neg Hx   . Esophageal cancer Neg Hx     Allergies  Allergen Reactions  . Amlodipine     Pt states it made her feel "awful and weird."  . Hydrocodone-Acetaminophen Swelling    rash  . Metoprolol Other (See Comments)    " feel weird"  . Penicillins Other (See Comments)    SOB, red face  . Vicodin [Hydrocodone-Acetaminophen]     Red face, pt doesn't remember other symptoms    Current Outpatient Medications on File Prior to Visit  Medication Sig Dispense Refill  . Ascorbic Acid (VITAMIN C) 1000 MG tablet Take 1,000 mg by mouth daily.    Marland Kitchen b complex vitamins tablet Take 1 tablet by mouth daily.    . calcium carbonate (TUMS - DOSED IN MG ELEMENTAL CALCIUM) 500 MG chewable tablet Chew 1 tablet by mouth daily.    . carvedilol (COREG) 12.5 MG tablet Take 1 tablet (12.5 mg total) by mouth 2 (two) times daily with a meal. 180 tablet 1  . Cholecalciferol (VITAMIN D) 2000 UNITS CAPS Take by mouth.    Marland Kitchen ibuprofen (ADVIL,MOTRIN) 600 MG tablet Take 600 mg by mouth at bedtime as needed.      Marland Kitchen LEVOXYL 112 MCG tablet TAKE 1 TABLET DAILY 90 tablet 3  . lisinopril (PRINIVIL,ZESTRIL) 40 MG tablet TAKE 1 TABLET DAILY 90 tablet 3  . omeprazole (PRILOSEC) 20 MG capsule TAKE 1 CAPSULE DAILY 90 capsule 3  . simvastatin (ZOCOR) 40 MG tablet TAKE 1 TABLET AT BEDTIME 90 tablet 2   No current facility-administered medications on file prior to visit.     BP 132/80   Temp (!) 97.3 F (36.3 C) (Temporal)   Wt 194 lb (88 kg)   BMI 36.06 kg/m       Objective:   Physical Exam Vitals signs and nursing note reviewed.  Constitutional:      Appearance: Normal appearance.  HENT:     Nose: Nose normal. No congestion or rhinorrhea.     Mouth/Throat:  Mouth: Mucous membranes are moist.     Pharynx: Oropharynx is clear. No oropharyngeal exudate or  posterior oropharyngeal erythema.  Cardiovascular:     Rate and Rhythm: Regular rhythm. Bradycardia present.     Pulses: Normal pulses.     Heart sounds: Normal heart sounds.  Pulmonary:     Effort: Pulmonary effort is normal.  Abdominal:     General: Abdomen is flat.     Palpations: Abdomen is soft.  Musculoskeletal: Normal range of motion.  Skin:    General: Skin is warm and dry.  Neurological:     General: No focal deficit present.     Mental Status: She is alert and oriented to person, place, and time.  Psychiatric:        Mood and Affect: Mood normal.        Behavior: Behavior normal.        Thought Content: Thought content normal.        Judgment: Judgment normal.       Assessment & Plan:  1. Chest pain, unspecified type -EKGs showed sinus bradycardia, anterior infarct-age undetermined.  Negative T waves-anterior septal ischemia.  Rate 58. - EKG 12-Lead - Ambulatory referral to Cardiology -Bradycardia likely due to beta-blocker -If chest pain returns and she was advised to go to the emergency room  2. Fatigue, unspecified type -Possibly related to beta-blocker.  We will have her cut back to 6.25 mg twice daily.  Have her monitor her blood pressure closely at home and follow-up via MyChart with blood pressure readings over the weekend.  [name], NP

## 2019-05-07 ENCOUNTER — Encounter: Payer: Self-pay | Admitting: Adult Health

## 2019-05-08 ENCOUNTER — Other Ambulatory Visit: Payer: Self-pay | Admitting: Adult Health

## 2019-05-08 DIAGNOSIS — I10 Essential (primary) hypertension: Secondary | ICD-10-CM

## 2019-05-08 DIAGNOSIS — R002 Palpitations: Secondary | ICD-10-CM

## 2019-05-08 MED ORDER — CARVEDILOL 6.25 MG PO TABS: 6.2500 mg | ORAL_TABLET | Freq: Two times a day (BID) | ORAL | 1 refills | Status: DC

## 2019-05-09 ENCOUNTER — Other Ambulatory Visit: Payer: Self-pay

## 2019-05-09 ENCOUNTER — Encounter: Payer: Self-pay | Admitting: Cardiovascular Disease

## 2019-05-09 ENCOUNTER — Ambulatory Visit (INDEPENDENT_AMBULATORY_CARE_PROVIDER_SITE_OTHER): Payer: Medicare Other | Admitting: Cardiovascular Disease

## 2019-05-09 VITALS — BP 145/79 | HR 75 | Ht 62.0 in | Wt 199.0 lb

## 2019-05-09 DIAGNOSIS — Z01812 Encounter for preprocedural laboratory examination: Secondary | ICD-10-CM

## 2019-05-09 DIAGNOSIS — R0602 Shortness of breath: Secondary | ICD-10-CM

## 2019-05-09 DIAGNOSIS — R072 Precordial pain: Secondary | ICD-10-CM | POA: Diagnosis not present

## 2019-05-09 DIAGNOSIS — R002 Palpitations: Secondary | ICD-10-CM

## 2019-05-09 LAB — BASIC METABOLIC PANEL
BUN/Creatinine Ratio: 22 (ref 12–28)
BUN: 29 mg/dL — ABNORMAL HIGH (ref 8–27)
CO2: 20 mmol/L (ref 20–29)
Calcium: 9.5 mg/dL (ref 8.7–10.3)
Chloride: 105 mmol/L (ref 96–106)
Creatinine, Ser: 1.3 mg/dL — ABNORMAL HIGH (ref 0.57–1.00)
GFR calc Af Amer: 46 mL/min/{1.73_m2} — ABNORMAL LOW (ref >59)
GFR calc non Af Amer: 40 mL/min/{1.73_m2} — ABNORMAL LOW (ref >59)
Glucose: 104 mg/dL — ABNORMAL HIGH (ref 65–99)
Potassium: 5 mmol/L (ref 3.5–5.2)
Sodium: 139 mmol/L (ref 134–144)

## 2019-05-09 MED ORDER — METOPROLOL TARTRATE 50 MG PO TABS: ORAL_TABLET | ORAL | 0 refills | Status: DC

## 2019-05-09 NOTE — Assessment & Plan Note (Signed)
History of essential hypertension with blood pressure measured today at 145/79.  She is on carvedilol and lisinopril.

## 2019-05-09 NOTE — Patient Instructions (Signed)
Medication Instructions:  TAKE 50MG  METOPROLOL 2 HOURS PRIOR TO CTA.  If you need a refill on your cardiac medications before your next appointment, please call your pharmacy.   Lab work: BMET If you have labs (blood work) drawn today and your tests are completely normal, you will receive your results only by: [place] (if you have MyChart) OR A paper copy in the mail If you have any lab test that is abnormal or we need to change your treatment, we will call you to review the results.  Testing: Non-Cardiac CT Angiography (CTA), is a special type of CT scan that uses a computer to produce multi-dimensional views of major blood vessels throughout the body. In CT angiography, a contrast material is injected through an IV to help visualize the blood vessels   Follow-Up: At [facility], you and your health needs are our priority.  As part of our continuing mission to provide you with exceptional heart care, we have created designated Provider Care Teams.  These Care Teams include your primary Cardiologist (physician) and Advanced Practice Providers (APPs -  Physician Assistants and Nurse Practitioners) who all work together to provide you with the care you need, when you need it. You may see Dr Gwenlyn Found or one of the following Advanced Practice Providers on your designated Care Team:    Kerin Ransom, PA-C  [place], [place]  Coletta Memos, [place] Your physician wants you to follow-up after your results of CTA.     Any Other Special Instructions Will Be Listed Below (If Applicable). Testing/Procedures: Your cardiac CT will be scheduled at one of the below locations:   [facility] [address] [place], [place] [address] [phone]  If scheduled at [facility], please arrive at the [facility] main entrance of [facility] 30-45 minutes prior to test start time. Proceed to the [facility] Radiology Department (first floor) to check-in and test  prep.   Please follow these instructions carefully (unless otherwise directed):   On the Night Before the Test: . Be sure to Drink plenty of water. . Do not consume any caffeinated/decaffeinated beverages or chocolate 12 hours prior to your test. . Do not take any antihistamines 12 hours prior to your test.  On the Day of the Test: . Drink plenty of water. Do not drink any water within one hour of the test. . Do not eat any food 4 hours prior to the test. . You may take your regular medications prior to the test.  . Take 50mg  metoprolol (Lopressor) two hours prior to test. . HOLD Furosemide/Hydrochlorothiazide morning of the test. . FEMALES- please wear underwire-free bra if available       After the Test: . Drink plenty of water. . After receiving IV contrast, you may experience a mild flushed feeling. This is normal. . On occasion, you may experience a mild rash up to 24 hours after the test. This is not dangerous. If this occurs, you can take Benadryl 25 mg and increase your fluid intake. . If you experience trouble breathing, this can be serious. If it is severe call 911 IMMEDIATELY. If it is mild, please call our office. . If you take any of these medications: Glipizide/Metformin, Avandament, Glucavance, please do not take 48 hours after completing test unless otherwise instructed.   Once we have confirmed authorization from your insurance company, we will call you to set up a date and time for your test.   For non-scheduling related  questions, please contact the cardiac imaging nurse navigator should you have any questions/concerns: Marchia Bond, RN Navigator Cardiac Imaging [facility] [phone] Office

## 2019-05-09 NOTE — Assessment & Plan Note (Addendum)
Hannah Morrow was referred to me by [name] NP for evaluation of chest pain.  Risk factors include treated hypertension hyperlipidemia.  She does have GERD.  The pain began last Tuesday and lasted for 3 days and then resolve spontaneously.  She said it somewhat improved with eructation.  The pain went into her back neck and jaws as well.  She said no recurrent symptoms.  I will get a coronary CTA to further evaluate.

## 2019-05-09 NOTE — Assessment & Plan Note (Signed)
History of hyperlipidemia on statin therapy with lipid profile performed 07/28/2018 revealing a total cholesterol 199, LDL of 106 and HDL of 54.

## 2019-05-09 NOTE — Progress Notes (Signed)
05/09/2019 Hannah Morrow   09/23/1940  [id number]  Primary Physician Nafziger, Tommi Rumps, NP Primary Cardiologist: Lorretta Harp MD [name], [name]  HPI:  Hannah Morrow is a 78 y.o. moderately overweight married Caucasian female mother of 2 daughters, grandmother of 5 grandchildren and great grandmother of a great grandchildren referred by [name] NP for evaluation of chest pain.  Her husband Gene is also a patient of mine.  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  Her risk factors include treated hypertension and hyperlipidemia.  There is no family history of heart disease.  She is never had a heart attack or stroke.  She is otherwise healthy.  She does have a history of GERD.  She had chest pain last week lasting 3 days rating to her neck back shoulders and throat relieved with eructation.  She has had no recurrent symptoms.   Current Meds  Medication Sig  . Ascorbic Acid (VITAMIN C) 1000 MG tablet Take 1,000 mg by mouth daily.  Marland Kitchen b complex vitamins tablet Take 1 tablet by mouth daily.  . calcium carbonate (TUMS - DOSED IN MG ELEMENTAL CALCIUM) 500 MG chewable tablet Chew 1 tablet by mouth daily.  . carvedilol (COREG) 6.25 MG tablet Take 1 tablet (6.25 mg total) by mouth 2 (two) times daily with a meal.  . Cholecalciferol (VITAMIN D) 2000 UNITS CAPS Take by mouth.  Marland Kitchen ibuprofen (ADVIL,MOTRIN) 600 MG tablet Take 600 mg by mouth at bedtime as needed.    Marland Kitchen LEVOXYL 112 MCG tablet TAKE 1 TABLET DAILY  . lisinopril (PRINIVIL,ZESTRIL) 40 MG tablet TAKE 1 TABLET DAILY  . omeprazole (PRILOSEC) 20 MG capsule TAKE 1 CAPSULE DAILY  . simvastatin (ZOCOR) 40 MG tablet TAKE 1 TABLET AT BEDTIME     Allergies  Allergen Reactions  . Amlodipine     Pt states it made her feel "awful and weird."  . Hydrocodone-Acetaminophen Swelling    rash  . Metoprolol Other (See Comments)    " feel weird"  . Penicillins Other (See Comments)    SOB, red face  .  Vicodin [Hydrocodone-Acetaminophen]     Red face, pt doesn't remember other symptoms    Social History   Socioeconomic History  . Marital status: Married    Spouse name: Not on file  . Number of children: 2  . Years of education: Not on file  . Highest education level: Not on file  Occupational History  . Not on file  Social Needs  . Financial resource strain: Not on file  . Food insecurity    Worry: Not on file    Inability: Not on file  . Transportation needs    Medical: Not on file    Non-medical: Not on file  Tobacco Use  . Smoking status: Never Smoker  . Smokeless tobacco: Never Used  Substance and Sexual Activity  . Alcohol use: Yes    Alcohol/week: 5.0 standard drinks    Types: 5 Glasses of wine per week    Comment: Occasional  . Drug use: No  . Sexual activity: Yes    Partners: Male    Birth control/protection: Surgical, Other-see comments    Comment: hysterectomy  Lifestyle  . Physical activity    Days per week: Not on file    Minutes per session: Not on file  . Stress: Not on file  Relationships  . Social connections    Talks on phone: Not on file  Gets together: Not on file    Attends religious service: Not on file    Active member of club or organization: Not on file    Attends meetings of clubs or organizations: Not on file    Relationship status: Not on file  . Intimate partner violence    Fear of current or ex partner: Not on file    Emotionally abused: Not on file    Physically abused: Not on file    Forced sexual activity: Not on file  Other Topics Concern  . Not on file  Social History Narrative   Married for 13 years   Two daughters, five grand children and 5 great grand children. Daughter lives in [place]. Other daughter lives in [place].    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    Exercise Does not exercise. Was going to exercise class but has since quite.    Diet: Veggies, chicken,  salmon. Very little red meat. Eats lots of eggs.    Exercise --- walking , gym 3 classes a week     Review of Systems: General: negative for chills, fever, night sweats or weight changes.  Cardiovascular: negative for chest pain, dyspnea on exertion, edema, orthopnea, palpitations, paroxysmal nocturnal dyspnea or shortness of breath Dermatological: negative for rash Respiratory: negative for cough or wheezing Urologic: negative for hematuria Abdominal: negative for nausea, vomiting, diarrhea, bright red blood per rectum, melena, or hematemesis Neurologic: negative for visual changes, syncope, or dizziness All other systems reviewed and are otherwise negative except as noted above.    Blood pressure (!) 145/79, pulse 75, height 5\' 2"  (1.575 m), weight 199 lb (90.3 kg), SpO2 93 %.  General appearance: alert and no distress Neck: no adenopathy, no carotid bruit, no JVD, supple, symmetrical, trachea midline and thyroid not enlarged, symmetric, no tenderness/mass/nodules Lungs: clear to auscultation bilaterally Heart: regular rate and rhythm, S1, S2 normal, no murmur, click, rub or gallop Extremities: extremities normal, atraumatic, no cyanosis or edema Pulses: 2+ and symmetric Skin: Skin color, texture, turgor normal. No rashes or lesions Neurologic: Alert and oriented X 3, normal strength and tone. Normal symmetric reflexes. Normal coordination and gait  EKG sinus rhythm 75 with septal Q waves.  I personally reviewed this EKG.  ASSESSMENT AND PLAN:   Hyperlipidemia History of hyperlipidemia on statin therapy with lipid profile performed 07/28/2018 revealing a total cholesterol 199, LDL of 106 and HDL of 54.  Essential hypertension History of essential hypertension with blood pressure measured today at 145/79.  She is on carvedilol and lisinopril.  CHEST PAIN UNSPECIFIED Ms. Guzzardi was referred to me by [name] NP for evaluation of chest pain.  Risk factors include treated  hypertension hyperlipidemia.  She does have GERD.  The pain began last Tuesday and lasted for 3 days and then resolve spontaneously.  She said it somewhat improved with eructation.  The pain went into her back neck and jaws as well.  She said no recurrent symptoms.  I will get a coronary CTA to further evaluate.      Lorretta Harp MD FACP,FACC,FAHA, [facility] 05/09/2019 10:21 AM

## 2019-05-10 ENCOUNTER — Telehealth: Payer: Self-pay | Admitting: Cardiovascular Disease

## 2019-05-10 NOTE — Telephone Encounter (Signed)
Patient returning call.

## 2019-05-10 NOTE — Telephone Encounter (Signed)
See lab results ./cy 

## 2019-05-16 DIAGNOSIS — R1084 Generalized abdominal pain: Secondary | ICD-10-CM | POA: Diagnosis not present

## 2019-05-16 DIAGNOSIS — R0789 Other chest pain: Secondary | ICD-10-CM | POA: Diagnosis not present

## 2019-05-16 DIAGNOSIS — I10 Essential (primary) hypertension: Secondary | ICD-10-CM | POA: Diagnosis not present

## 2019-05-16 DIAGNOSIS — R52 Pain, unspecified: Secondary | ICD-10-CM | POA: Diagnosis not present

## 2019-05-16 DIAGNOSIS — R079 Chest pain, unspecified: Secondary | ICD-10-CM | POA: Diagnosis not present

## 2019-05-17 ENCOUNTER — Telehealth: Payer: Self-pay | Admitting: Gastroenterology

## 2019-05-17 ENCOUNTER — Other Ambulatory Visit: Payer: Self-pay | Admitting: Adult Health

## 2019-05-17 ENCOUNTER — Encounter: Payer: Self-pay | Admitting: Adult Health

## 2019-05-17 DIAGNOSIS — R0789 Other chest pain: Secondary | ICD-10-CM

## 2019-05-17 DIAGNOSIS — K8020 Calculus of gallbladder without cholecystitis without obstruction: Secondary | ICD-10-CM

## 2019-05-17 NOTE — Telephone Encounter (Signed)
Dr. Silverio Decamp, this patient was last seen by you in 2016.  She requested to transfer care to Dr. Carlean Purl because her daughter is Dr. Celesta Aver patient.  Will you approve the switch?

## 2019-05-18 NOTE — Telephone Encounter (Signed)
Dr. Carlean Purl will you accept this patient?

## 2019-05-18 NOTE — Telephone Encounter (Signed)
Dr. Carlean Purl, please see below.  There is a referral for Mrs. Grate to be evaluated for non-cardiac chest pain.  Concern for gastritis vs esophageal spasms.  Please advise.

## 2019-05-18 NOTE — Telephone Encounter (Signed)
Ok. I will see her.

## 2019-05-18 NOTE — Telephone Encounter (Signed)
Yes it is fine.  I only saw her during a procedure 4 years ago.

## 2019-05-22 ENCOUNTER — Telehealth: Payer: Self-pay | Admitting: *Deleted

## 2019-05-22 NOTE — Telephone Encounter (Signed)
Spoke with patient and she has been having chest pains when she walks her dog. She has been walking her dog about a block twice a day and now she only gets a couple houses in. She has pain/tightness that radiates down her left arm, relieved with rest. Unable to get patients Cardiac CT moved up. Discussed with Doreene Burke PA and he will see in her office tomorrow. Advised patient if worse or pain with no relief to call 911, verbalized understanding.

## 2019-05-23 ENCOUNTER — Other Ambulatory Visit: Payer: Self-pay

## 2019-05-23 ENCOUNTER — Encounter: Payer: Self-pay | Admitting: Cardiology

## 2019-05-23 ENCOUNTER — Ambulatory Visit (INDEPENDENT_AMBULATORY_CARE_PROVIDER_SITE_OTHER): Payer: Medicare Other | Admitting: Cardiology

## 2019-05-23 DIAGNOSIS — E785 Hyperlipidemia, unspecified: Secondary | ICD-10-CM

## 2019-05-23 DIAGNOSIS — R9431 Abnormal electrocardiogram [ECG] [EKG]: Secondary | ICD-10-CM | POA: Diagnosis not present

## 2019-05-23 DIAGNOSIS — R079 Chest pain, unspecified: Secondary | ICD-10-CM | POA: Diagnosis not present

## 2019-05-23 DIAGNOSIS — I10 Essential (primary) hypertension: Secondary | ICD-10-CM | POA: Diagnosis not present

## 2019-05-23 MED ORDER — ASPIRIN EC 81 MG PO TBEC: 81.0000 mg | DELAYED_RELEASE_TABLET | Freq: Every day | ORAL | 3 refills | Status: DC

## 2019-05-23 MED ORDER — NITROGLYCERIN 0.4 MG SL SUBL: 0.4000 mg | SUBLINGUAL_TABLET | SUBLINGUAL | 3 refills | Status: DC | PRN

## 2019-05-23 NOTE — Progress Notes (Signed)
Cardiology Office Note:    Date:  05/23/2019   ID:  Hannah Morrow, DOB 1940/12/01, MRN [id number]  PCP:  [name], NP  Cardiologist:  Dr Gwenlyn Found Electrophysiologist:  None   Referring MD: [name], NP   CC:  Chest pain "tightness" with exertion  History of Present Illness:    Hannah Morrow is a 78 y.o. female with a hx of hypertension and hyperlipidemia who was referred to Dr. Gwenlyn Found May 09, 2019 for evaluation of chest pain. A coronary CTA was ordered but could not be done until Dec. The patient called the office yesterday c/o exertional chest tightness while walking her dog.  Her symptoms have been going on for 2-3 weeks.  She describes "tightness" with positive Levine sign.  It does radiate to her Lt arm.  No associated nausea, vomiting, or dyspnea.  Her symptoms are relieved with rest.  She actually called EMS at one poiint but declined admission.  She was added on to my shcedule today.  In reviewing her EKG she does have new TWI and septal Qs compared with July 2020.  She is pain free at rest.    Past Medical History:  Diagnosis Date  . Allergy    no meds  . ANXIETY   . Cancer [facility])    cervical cancer - hysterectomy  . GERD   . Hyperlipidemia   . Hypertension   . Hypothyroid   . Plantar fasciitis    right  . RESTLESS LEGS SYNDROME     Past Surgical History:  Procedure Laterality Date  . ABDOMINAL HYSTERECTOMY    . BREAST SURGERY     Bilateral breast reduction  . COLOSTOMY  04-22-2010   [name]  . cone bx     cervix  . REDUCTION MAMMAPLASTY Bilateral   . WISDOM TOOTH EXTRACTION      Current Medications: Current Meds  Medication Sig  . Ascorbic Acid (VITAMIN C) 1000 MG tablet Take 1,000 mg by mouth daily.  Marland Kitchen b complex vitamins tablet Take 1 tablet by mouth daily.  . calcium carbonate (TUMS - DOSED IN MG ELEMENTAL CALCIUM) 500 MG chewable tablet Chew 1 tablet by mouth daily.  . carvedilol (COREG) 6.25 MG tablet Take 1 tablet  (6.25 mg total) by mouth 2 (two) times daily with a meal.  . Cholecalciferol (VITAMIN D) 2000 UNITS CAPS Take by mouth.  Marland Kitchen ibuprofen (ADVIL,MOTRIN) 600 MG tablet Take 600 mg by mouth at bedtime as needed.    Marland Kitchen LEVOXYL 112 MCG tablet TAKE 1 TABLET DAILY  . lisinopril (PRINIVIL,ZESTRIL) 40 MG tablet TAKE 1 TABLET DAILY  . metoprolol tartrate (LOPRESSOR) 50 MG tablet Take 2 hours prior to CT  . omeprazole (PRILOSEC) 20 MG capsule TAKE 1 CAPSULE DAILY  . simvastatin (ZOCOR) 40 MG tablet TAKE 1 TABLET AT BEDTIME     Allergies:   Amlodipine, Hydrocodone-acetaminophen, Metoprolol, Penicillins, and Vicodin [hydrocodone-acetaminophen]   Social History   Socioeconomic History  . Marital status: Married    Spouse name: Not on file  . Number of children: 2  . Years of education: Not on file  . Highest education level: Not on file  Occupational History  . Not on file  Social Needs  . Financial resource strain: Not on file  . Food insecurity    Worry: Not on file    Inability: Not on file  . Transportation needs    Medical: Not on file    Non-medical: Not on file  Tobacco Use  .  Smoking status: Never Smoker  . Smokeless tobacco: Never Used  Substance and Sexual Activity  . Alcohol use: Yes    Alcohol/week: 5.0 standard drinks    Types: 5 Glasses of wine per week    Comment: Occasional  . Drug use: No  . Sexual activity: Yes    Partners: Male    Birth control/protection: Surgical, Other-see comments    Comment: hysterectomy  Lifestyle  . Physical activity    Days per week: Not on file    Minutes per session: Not on file  . Stress: Not on file  Relationships  . Social Herbalist on phone: Not on file    Gets together: Not on file    Attends religious service: Not on file    Active member of club or organization: Not on file    Attends meetings of clubs or organizations: Not on file    Relationship status: Not on file  Other Topics Concern  . Not on file  Social  History Narrative   Married for 46 years   Two daughters, five grand children and 5 great grand children. Daughter lives in [place]. Other daughter lives in [place].    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    Exercise Does not exercise. Was going to exercise class but has since quite.    Diet: Veggies, chicken, salmon. Very little red meat. Eats lots of eggs.    Exercise --- walking , gym 3 classes a week     Family History: The patient's family history includes Breast cancer (age of onset: 72) in her daughter; Breast cancer (age of onset: 54) in her mother; Cancer in her sister; Colon cancer (age of onset: 52) in her father; Stomach cancer in her maternal grandfather. There is no history of Rectal cancer or Esophageal cancer.  ROS:   Please see the history of present illness. Back pian     All other systems reviewed and are negative.  EKGs/Labs/Other Studies Reviewed:      EKG:  EKG is ordered today.  The ekg ordered today demonstrates NSR, HR 62, septal Qs V2 with TWI V2-V4  Recent Labs: 07/28/2018: ALT 26; Hemoglobin 14.2; Platelets 227.0; TSH 1.15 05/09/2019: BUN 29; Creatinine, Ser 1.30; Potassium 5.0; Sodium 139  Recent Lipid Panel    Component Value Date/Time   CHOL 199 07/28/2018 1153   TRIG 195.0 (H) 07/28/2018 1153   TRIG 169 (H) 06/17/2006 0936   HDL 54.60 07/28/2018 1153   CHOLHDL 4 07/28/2018 1153   VLDL 39.0 07/28/2018 1153   LDLCALC 106 (H) 07/28/2018 1153   LDLDIRECT 107.0 07/26/2017 0939    Physical Exam:    VS:  BP 118/80   Pulse 66   Temp (!) 95.9 F (35.5 C) (Temporal)   Ht 5\' 2"  (1.575 m)   Wt 194 lb (88 kg)   SpO2 97%   BMI 35.48 kg/m     Wt Readings from Last 3 Encounters:  05/23/19 194 lb (88 kg)  05/09/19 199 lb (90.3 kg)  05/04/19 194 lb (88 kg)     GEN:  Overweight Caucasian female, well developed in no acute distress HEENT: Normal NECK: No JVD; No carotid bruits LYMPHATICS: No  lymphadenopathy CARDIAC: RRR, no murmurs, rubs, gallops RESPIRATORY:  Clear to auscultation without rales, wheezing or rhonchi  ABDOMEN: Obese, soft, non-tender, non-distended MUSCULOSKELETAL:  No edema; No deformity  SKIN: Warm  and dry NEUROLOGIC:  Alert and oriented x 3 PSYCHIATRIC:  Normal affect   ASSESSMENT:    Chest pain with high risk for cardiac etiology Pt gives a history of exertional angina -"chest tightness" with exertion for two weeks.  She has new anterior TWI c/w her EKG in July 2020  Abnormal EKG Septal Qs and TWI - new c/w July 2020  Dyslipidemia, goal LDL below 70 LDL was 106 on Zocor 40 mg Jan 2020  Essential hypertension Controlled  PLAN:    Reviewed with Dr Percival Spanish DOD- add ASA 81 mg and NTG PRN- OP cath ASAP.   The patient understands that risks included but are not limited to stroke (1 in 1000), death (1 in 3), kidney failure [usually temporary] (1 in 500), bleeding (1 in 200), allergic reaction [possibly serious] (1 in 200).  The patient understands and agrees to proceed.    Medication Adjustments/Labs and Tests Ordered: Current medicines are reviewed at length with the patient today.  Concerns regarding medicines are outlined above.  No orders of the defined types were placed in this encounter.  No orders of the defined types were placed in this encounter.   There are no Patient Instructions on file for this visit.   Signed, Kerin Ransom, PA-C  05/23/2019 1:23 PM    [facility]

## 2019-05-23 NOTE — Patient Instructions (Addendum)
Medication Instructions:  START Aspirin 81mg  Take 1 tablet once a day  START Nitrostat Take 1 tablet as needed for emergency chest pain *If you need a refill on your cardiac medications before your next appointment, please call your pharmacy*  Lab Work: Your physician recommends that you return for lab work in: TODAY-BMET, CBC If you have labs (blood work) drawn today and your tests are completely normal, you will receive your results only by: Marland Kitchen MyChart Message (if you have MyChart) OR . A paper copy in the mail If you have any lab test that is abnormal or we need to change your treatment, we will call you to review the results.  Testing/Procedures: Your physician has requested that you have a cardiac catheterization. Cardiac catheterization is used to diagnose and/or treat various heart conditions. Doctors may recommend this procedure for a number of different reasons. The most common reason is to evaluate chest pain. Chest pain can be a symptom of coronary artery disease (CAD), and cardiac catheterization can show whether plaque is narrowing or blocking your heart's arteries. This procedure is also used to evaluate the valves, as well as measure the blood flow and oxygen levels in different parts of your heart. For further information please visit [email or web address]. Please follow instruction sheet, as given. SEE INSTRUCTIONS BELOW  Follow-Up: At [facility], you and your health needs are our priority.  As part of our continuing mission to provide you with exceptional heart care, we have created designated Provider Care Teams.  These Care Teams include your primary Cardiologist (physician) and Advanced Practice Providers (APPs -  Physician Assistants and Nurse Practitioners) who all work together to provide you with the care you need, when you need it.  Your next appointment:    TO BE DETERMINED AFTER  CATH  The format for your next appointment:   In Person  Provider:   You may see  Quay Burow, MD or one of the following Advanced Practice Providers on youR designated Care Team:    Kerin Ransom, PA-C  [place], [place]  Coletta Memos, [place]   Other Instructions           [place] [address] Dept: [phone] Loc: [place]  05/23/2019  You are scheduled for a Cardiac Catheterization on Friday, November 20 with Dr. Glenetta [place].  1. Please arrive at the [facility] (Main Entrance A) at [facility]: [address] [place], [place] [address] at 11:30 AM (This time is two hours before your procedure to ensure your preparation). Free valet parking service is available.   Special note: Every effort is made to have your procedure done on time. Please understand that emergencies sometimes delay scheduled procedures.  2. Diet: Do not eat solid foods after midnight.  The patient may have clear liquids until 5am upon the day of the procedure.  3. Labs: You will need to have blood drawn on Wednesday, November 18 at [place]  Open: 8am - 5pm (Lunch 12:30 - 1:30)   Phone: [phone]. You do not need to be fasting.  Covid Testing:  YOU MUST COMPLETE A COVID TEST 4 DAYS PRIOR TO YOUR PROCEDURE. ONCE YOU COMPLETE YOUR COVID TEST YOU MUST REMAIN QUARANTINED UNTIL THE DAY OF YOUR PROCEDURE.  YOUR COVID TEST WILL BE COMPLETED AT THE OLD [facility] ON [address]. LOOK FOR THE  SIGNS TO TELL YOU WHICH LINE TO GET IN. YOU WILL HAVE YOUR NOSE SWABBED FROM YOUR VEHICLE.   YOUR APPOINTMENT TIME FOR YOUR TEST IS: 05/24/2019 at 8:20am.  4. Medication instructions in preparation for your procedure:   Contrast Allergy: No  On the morning of your procedure, take your Aspirin and any morning medicines NOT listed above.  You may use sips of water.  5. Plan for  one night stay--bring personal belongings. 6. Bring a current list of your medications and current insurance cards. 7. You MUST have a responsible person to drive you home. 8. Someone MUST be with you the first 24 hours after you arrive home or your discharge will be delayed. 9. Please wear clothes that are easy to get on and off and wear slip-on shoes.  Thank you for allowing [place] to care for you!   -- [facility]

## 2019-05-23 NOTE — Assessment & Plan Note (Signed)
Controlled.  

## 2019-05-23 NOTE — Assessment & Plan Note (Signed)
Pt gives a history of exertional angina -"chest tightness" with exertion for two weeks.  She has new anterior TWI c/w her EKG in July 2020

## 2019-05-23 NOTE — Assessment & Plan Note (Signed)
LDL was 106 on Zocor 40 mg Jan 2020

## 2019-05-23 NOTE — Assessment & Plan Note (Signed)
Septal Qs and TWI - new c/w July 2020

## 2019-05-23 NOTE — H&P (View-Only) (Signed)
Cardiology Office Note:    Date:  05/23/2019   ID:  Hannah Morrow, DOB 06/03/1941, MRN [id number]  PCP:  [name], NP  Cardiologist:  Dr Gwenlyn Found Electrophysiologist:  None   Referring MD: [name], NP   CC:  Chest pain "tightness" with exertion  History of Present Illness:    Hannah Morrow is a 78 y.o. female with a hx of hypertension and hyperlipidemia who was referred to Dr. Gwenlyn Found May 09, 2019 for evaluation of chest pain. A coronary CTA was ordered but could not be done until Dec. The patient called the office yesterday c/o exertional chest tightness while walking her dog.  Her symptoms have been going on for 2-3 weeks.  She describes "tightness" with positive Levine sign.  It does radiate to her Lt arm.  No associated nausea, vomiting, or dyspnea.  Her symptoms are relieved with rest.  She actually called EMS at one poiint but declined admission.  She was added on to my shcedule today.  In reviewing her EKG she does have new TWI and septal Qs compared with July 2020.  She is pain free at rest.    Past Medical History:  Diagnosis Date  . Allergy    no meds  . ANXIETY   . Cancer [facility])    cervical cancer - hysterectomy  . GERD   . Hyperlipidemia   . Hypertension   . Hypothyroid   . Plantar fasciitis    right  . RESTLESS LEGS SYNDROME     Past Surgical History:  Procedure Laterality Date  . ABDOMINAL HYSTERECTOMY    . BREAST SURGERY     Bilateral breast reduction  . COLOSTOMY  04-22-2010   [name]  . cone bx     cervix  . REDUCTION MAMMAPLASTY Bilateral   . WISDOM TOOTH EXTRACTION      Current Medications: Current Meds  Medication Sig  . Ascorbic Acid (VITAMIN C) 1000 MG tablet Take 1,000 mg by mouth daily.  Marland Kitchen b complex vitamins tablet Take 1 tablet by mouth daily.  . calcium carbonate (TUMS - DOSED IN MG ELEMENTAL CALCIUM) 500 MG chewable tablet Chew 1 tablet by mouth daily.  . carvedilol (COREG) 6.25 MG tablet Take 1 tablet  (6.25 mg total) by mouth 2 (two) times daily with a meal.  . Cholecalciferol (VITAMIN D) 2000 UNITS CAPS Take by mouth.  Marland Kitchen ibuprofen (ADVIL,MOTRIN) 600 MG tablet Take 600 mg by mouth at bedtime as needed.    Marland Kitchen LEVOXYL 112 MCG tablet TAKE 1 TABLET DAILY  . lisinopril (PRINIVIL,ZESTRIL) 40 MG tablet TAKE 1 TABLET DAILY  . metoprolol tartrate (LOPRESSOR) 50 MG tablet Take 2 hours prior to CT  . omeprazole (PRILOSEC) 20 MG capsule TAKE 1 CAPSULE DAILY  . simvastatin (ZOCOR) 40 MG tablet TAKE 1 TABLET AT BEDTIME     Allergies:   Amlodipine, Hydrocodone-acetaminophen, Metoprolol, Penicillins, and Vicodin [hydrocodone-acetaminophen]   Social History   Socioeconomic History  . Marital status: Married    Spouse name: Not on file  . Number of children: 2  . Years of education: Not on file  . Highest education level: Not on file  Occupational History  . Not on file  Social Needs  . Financial resource strain: Not on file  . Food insecurity    Worry: Not on file    Inability: Not on file  . Transportation needs    Medical: Not on file    Non-medical: Not on file  Tobacco Use  .  Smoking status: Never Smoker  . Smokeless tobacco: Never Used  Substance and Sexual Activity  . Alcohol use: Yes    Alcohol/week: 5.0 standard drinks    Types: 5 Glasses of wine per week    Comment: Occasional  . Drug use: No  . Sexual activity: Yes    Partners: Male    Birth control/protection: Surgical, Other-see comments    Comment: hysterectomy  Lifestyle  . Physical activity    Days per week: Not on file    Minutes per session: Not on file  . Stress: Not on file  Relationships  . Social Herbalist on phone: Not on file    Gets together: Not on file    Attends religious service: Not on file    Active member of club or organization: Not on file    Attends meetings of clubs or organizations: Not on file    Relationship status: Not on file  Other Topics Concern  . Not on file  Social  History Narrative   Married for 17 years   Two daughters, five grand children and 5 great grand children. Daughter lives in [place]. Other daughter lives in [place].    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    Exercise Does not exercise. Was going to exercise class but has since quite.    Diet: Veggies, chicken, salmon. Very little red meat. Eats lots of eggs.    Exercise --- walking , gym 3 classes a week     Family History: The patient's family history includes Breast cancer (age of onset: 35) in her daughter; Breast cancer (age of onset: 71) in her mother; Cancer in her sister; Colon cancer (age of onset: 25) in her father; Stomach cancer in her maternal grandfather. There is no history of Rectal cancer or Esophageal cancer.  ROS:   Please see the history of present illness. Back pian     All other systems reviewed and are negative.  EKGs/Labs/Other Studies Reviewed:      EKG:  EKG is ordered today.  The ekg ordered today demonstrates NSR, HR 62, septal Qs V2 with TWI V2-V4  Recent Labs: 07/28/2018: ALT 26; Hemoglobin 14.2; Platelets 227.0; TSH 1.15 05/09/2019: BUN 29; Creatinine, Ser 1.30; Potassium 5.0; Sodium 139  Recent Lipid Panel    Component Value Date/Time   CHOL 199 07/28/2018 1153   TRIG 195.0 (H) 07/28/2018 1153   TRIG 169 (H) 06/17/2006 0936   HDL 54.60 07/28/2018 1153   CHOLHDL 4 07/28/2018 1153   VLDL 39.0 07/28/2018 1153   LDLCALC 106 (H) 07/28/2018 1153   LDLDIRECT 107.0 07/26/2017 0939    Physical Exam:    VS:  BP 118/80   Pulse 66   Temp (!) 95.9 F (35.5 C) (Temporal)   Ht 5\' 2"  (1.575 m)   Wt 194 lb (88 kg)   SpO2 97%   BMI 35.48 kg/m     Wt Readings from Last 3 Encounters:  05/23/19 194 lb (88 kg)  05/09/19 199 lb (90.3 kg)  05/04/19 194 lb (88 kg)     GEN:  Overweight Caucasian female, well developed in no acute distress HEENT: Normal NECK: No JVD; No carotid bruits LYMPHATICS: No  lymphadenopathy CARDIAC: RRR, no murmurs, rubs, gallops RESPIRATORY:  Clear to auscultation without rales, wheezing or rhonchi  ABDOMEN: Obese, soft, non-tender, non-distended MUSCULOSKELETAL:  No edema; No deformity  SKIN: Warm  and dry NEUROLOGIC:  Alert and oriented x 3 PSYCHIATRIC:  Normal affect   ASSESSMENT:    Chest pain with high risk for cardiac etiology Pt gives a history of exertional angina -"chest tightness" with exertion for two weeks.  She has new anterior TWI c/w her EKG in July 2020  Abnormal EKG Septal Qs and TWI - new c/w July 2020  Dyslipidemia, goal LDL below 70 LDL was 106 on Zocor 40 mg Jan 2020  Essential hypertension Controlled  PLAN:    Reviewed with Dr Percival Spanish DOD- add ASA 81 mg and NTG PRN- OP cath ASAP.   The patient understands that risks included but are not limited to stroke (1 in 1000), death (1 in 25), kidney failure [usually temporary] (1 in 500), bleeding (1 in 200), allergic reaction [possibly serious] (1 in 200).  The patient understands and agrees to proceed.    Medication Adjustments/Labs and Tests Ordered: Current medicines are reviewed at length with the patient today.  Concerns regarding medicines are outlined above.  No orders of the defined types were placed in this encounter.  No orders of the defined types were placed in this encounter.   There are no Patient Instructions on file for this visit.   Signed, Kerin Ransom, PA-C  05/23/2019 1:23 PM    [facility]

## 2019-05-24 ENCOUNTER — Other Ambulatory Visit: Payer: Medicare Other

## 2019-05-24 ENCOUNTER — Other Ambulatory Visit (HOSPITAL_COMMUNITY)
Admission: RE | Admit: 2019-05-24 | Discharge: 2019-05-24 | Disposition: A | Discharge status: Home / Self Care | Payer: Medicare Other | Source: Ambulatory Visit | Attending: Cardiology | Admitting: Cardiology

## 2019-05-24 ENCOUNTER — Telehealth: Payer: Self-pay | Admitting: *Deleted

## 2019-05-24 DIAGNOSIS — Z20828 Contact with and (suspected) exposure to other viral communicable diseases: Secondary | ICD-10-CM | POA: Insufficient documentation

## 2019-05-24 DIAGNOSIS — Z01812 Encounter for preprocedural laboratory examination: Secondary | ICD-10-CM | POA: Insufficient documentation

## 2019-05-24 LAB — CBC WITH DIFFERENTIAL/PLATELET
Basophils Absolute: 0.1 10*3/uL (ref 0.0–0.2)
Basos: 1 %
EOS (ABSOLUTE): 0.3 10*3/uL (ref 0.0–0.4)
Eos: 4 %
Hematocrit: 44.7 % (ref 34.0–46.6)
Hemoglobin: 14 g/dL (ref 11.1–15.9)
Immature Grans (Abs): 0 10*3/uL (ref 0.0–0.1)
Immature Granulocytes: 0 %
Lymphocytes Absolute: 2.7 10*3/uL (ref 0.7–3.1)
Lymphs: 35 %
MCH: 24.9 pg — ABNORMAL LOW (ref 26.6–33.0)
MCHC: 31.3 g/dL — ABNORMAL LOW (ref 31.5–35.7)
MCV: 80 fL (ref 79–97)
Monocytes Absolute: 0.9 10*3/uL (ref 0.1–0.9)
Monocytes: 12 %
Neutrophils Absolute: 3.7 10*3/uL (ref 1.4–7.0)
Neutrophils: 48 %
Platelets: 234 10*3/uL (ref 150–450)
RBC: 5.62 x10E6/uL — ABNORMAL HIGH (ref 3.77–5.28)
RDW: 12.8 % (ref 11.7–15.4)
WBC: 7.6 10*3/uL (ref 3.4–10.8)

## 2019-05-24 LAB — BASIC METABOLIC PANEL
BUN/Creatinine Ratio: 22 (ref 12–28)
BUN: 28 mg/dL — ABNORMAL HIGH (ref 8–27)
CO2: 20 mmol/L (ref 20–29)
Calcium: 9.7 mg/dL (ref 8.7–10.3)
Chloride: 100 mmol/L (ref 96–106)
Creatinine, Ser: 1.3 mg/dL — ABNORMAL HIGH (ref 0.57–1.00)
GFR calc Af Amer: 46 mL/min/{1.73_m2} — ABNORMAL LOW (ref >59)
GFR calc non Af Amer: 40 mL/min/{1.73_m2} — ABNORMAL LOW (ref >59)
Glucose: 87 mg/dL (ref 65–99)
Potassium: 4.8 mmol/L (ref 3.5–5.2)
Sodium: 139 mmol/L (ref 134–144)

## 2019-05-24 LAB — SARS CORONAVIRUS 2 (TAT 6-24 HRS): SARS Coronavirus 2: NEGATIVE

## 2019-05-24 NOTE — Telephone Encounter (Addendum)
Pt contacted pre-catheterization scheduled at [facility] for: Friday May 25, 2019 1:30 PM Verified arrival time and place: [facility]) at: 8:30 AM-pre procedure hydration   No solid food after midnight prior to cath, clear liquids until 5 AM day of procedure. Contrast allergy: no  Hold: Lisinopril-day before and day of procedure-GFR 40 NSAIDs-day before and day of procedure-GFR 40  Except hold medications AM meds can be  taken pre-cath with sip of water including: ASA 81 mg   Confirmed patient has responsible adult to drive home post procedure and observe 24 hours after arriving home: yes  Currently, due to Covid-19 pandemic, only one support person will be allowed with patient. Must be the same support person for that patient's entire stay, will be screened and required to wear a mask. They will be asked to wait in the waiting room for the duration of the patient's stay.  Patients are required to wear a mask when they enter the hospital.  Pt on the way to get Covid-19 test, will call her back in about 1 hour.

## 2019-05-24 NOTE — Telephone Encounter (Signed)
    COVID-19 Pre-Screening Questions:  . In the past 7 to 10 days have you had a cough,  shortness of breath, headache, congestion, fever (100 or greater) body aches, chills, sore throat, or sudden loss of taste or sense of smell? no . Have you been around anyone with known Covid 19? no . Have you been around anyone who is awaiting Covid 19 test results in the past 7 to 10 days? no . Have you been around anyone who has been exposed to Covid 19, or has mentioned symptoms of Covid 19 within the past 7 to 10 days? no    I reviewed procedure/mask/visitor instructions, Covid-19 screening questions with patient, she verbalized understanding, thanked me for call.          

## 2019-05-24 NOTE — Telephone Encounter (Addendum)
Pt not at home, will call back.

## 2019-05-25 ENCOUNTER — Encounter (HOSPITAL_COMMUNITY)
Admission: RE | Disposition: A | Discharge status: Home / Self Care | Payer: Self-pay | Source: Home / Self Care | Attending: Cardiothoracic Surgery

## 2019-05-25 ENCOUNTER — Other Ambulatory Visit: Payer: Self-pay

## 2019-05-25 ENCOUNTER — Inpatient Hospital Stay (HOSPITAL_COMMUNITY)
Admission: RE | Admit: 2019-05-25 | Discharge: 2019-06-02 | DRG: 234 | Disposition: A | Discharge status: Home / Self Care | Payer: Medicare Other | Attending: Cardiothoracic Surgery | Admitting: Cardiothoracic Surgery

## 2019-05-25 ENCOUNTER — Other Ambulatory Visit: Payer: Self-pay | Admitting: *Deleted

## 2019-05-25 DIAGNOSIS — I2510 Atherosclerotic heart disease of native coronary artery without angina pectoris: Secondary | ICD-10-CM

## 2019-05-25 DIAGNOSIS — D696 Thrombocytopenia, unspecified: Secondary | ICD-10-CM | POA: Diagnosis not present

## 2019-05-25 DIAGNOSIS — D62 Acute posthemorrhagic anemia: Secondary | ICD-10-CM | POA: Diagnosis not present

## 2019-05-25 DIAGNOSIS — E039 Hypothyroidism, unspecified: Secondary | ICD-10-CM | POA: Diagnosis not present

## 2019-05-25 DIAGNOSIS — R931 Abnormal findings on diagnostic imaging of heart and coronary circulation: Secondary | ICD-10-CM | POA: Diagnosis not present

## 2019-05-25 DIAGNOSIS — K219 Gastro-esophageal reflux disease without esophagitis: Secondary | ICD-10-CM | POA: Diagnosis present

## 2019-05-25 DIAGNOSIS — R9431 Abnormal electrocardiogram [ECG] [EKG]: Secondary | ICD-10-CM | POA: Diagnosis present

## 2019-05-25 DIAGNOSIS — R197 Diarrhea, unspecified: Secondary | ICD-10-CM | POA: Diagnosis not present

## 2019-05-25 DIAGNOSIS — Z951 Presence of aortocoronary bypass graft: Secondary | ICD-10-CM | POA: Diagnosis not present

## 2019-05-25 DIAGNOSIS — E785 Hyperlipidemia, unspecified: Secondary | ICD-10-CM | POA: Diagnosis not present

## 2019-05-25 DIAGNOSIS — I200 Unstable angina: Secondary | ICD-10-CM | POA: Diagnosis not present

## 2019-05-25 DIAGNOSIS — Z808 Family history of malignant neoplasm of other organs or systems: Secondary | ICD-10-CM | POA: Diagnosis not present

## 2019-05-25 DIAGNOSIS — Z90710 Acquired absence of both cervix and uterus: Secondary | ICD-10-CM

## 2019-05-25 DIAGNOSIS — Y92239 Unspecified place in hospital as the place of occurrence of the external cause: Secondary | ICD-10-CM | POA: Diagnosis not present

## 2019-05-25 DIAGNOSIS — Z803 Family history of malignant neoplasm of breast: Secondary | ICD-10-CM

## 2019-05-25 DIAGNOSIS — Z933 Colostomy status: Secondary | ICD-10-CM | POA: Diagnosis not present

## 2019-05-25 DIAGNOSIS — G2581 Restless legs syndrome: Secondary | ICD-10-CM | POA: Diagnosis present

## 2019-05-25 DIAGNOSIS — Z800 Family history of malignant neoplasm of digestive organs: Secondary | ICD-10-CM

## 2019-05-25 DIAGNOSIS — Z79899 Other long term (current) drug therapy: Secondary | ICD-10-CM | POA: Diagnosis not present

## 2019-05-25 DIAGNOSIS — R519 Headache, unspecified: Secondary | ICD-10-CM | POA: Diagnosis not present

## 2019-05-25 DIAGNOSIS — Z888 Allergy status to other drugs, medicaments and biological substances status: Secondary | ICD-10-CM

## 2019-05-25 DIAGNOSIS — F419 Anxiety disorder, unspecified: Secondary | ICD-10-CM | POA: Diagnosis present

## 2019-05-25 DIAGNOSIS — R110 Nausea: Secondary | ICD-10-CM | POA: Diagnosis not present

## 2019-05-25 DIAGNOSIS — Z880 Allergy status to penicillin: Secondary | ICD-10-CM | POA: Diagnosis not present

## 2019-05-25 DIAGNOSIS — Z01810 Encounter for preprocedural cardiovascular examination: Secondary | ICD-10-CM | POA: Diagnosis not present

## 2019-05-25 DIAGNOSIS — Z6835 Body mass index (BMI) 35.0-35.9, adult: Secondary | ICD-10-CM | POA: Diagnosis not present

## 2019-05-25 DIAGNOSIS — E8770 Fluid overload, unspecified: Secondary | ICD-10-CM | POA: Diagnosis not present

## 2019-05-25 DIAGNOSIS — Z791 Long term (current) use of non-steroidal anti-inflammatories (NSAID): Secondary | ICD-10-CM | POA: Diagnosis not present

## 2019-05-25 DIAGNOSIS — I459 Conduction disorder, unspecified: Secondary | ICD-10-CM | POA: Diagnosis not present

## 2019-05-25 DIAGNOSIS — N179 Acute kidney failure, unspecified: Secondary | ICD-10-CM | POA: Diagnosis not present

## 2019-05-25 DIAGNOSIS — I8390 Asymptomatic varicose veins of unspecified lower extremity: Secondary | ICD-10-CM | POA: Diagnosis present

## 2019-05-25 DIAGNOSIS — Z8541 Personal history of malignant neoplasm of cervix uteri: Secondary | ICD-10-CM | POA: Diagnosis not present

## 2019-05-25 DIAGNOSIS — I25110 Atherosclerotic heart disease of native coronary artery with unstable angina pectoris: Principal | ICD-10-CM | POA: Diagnosis present

## 2019-05-25 DIAGNOSIS — I10 Essential (primary) hypertension: Secondary | ICD-10-CM | POA: Diagnosis present

## 2019-05-25 DIAGNOSIS — Z20828 Contact with and (suspected) exposure to other viral communicable diseases: Secondary | ICD-10-CM | POA: Diagnosis not present

## 2019-05-25 DIAGNOSIS — Z01811 Encounter for preprocedural respiratory examination: Secondary | ICD-10-CM

## 2019-05-25 DIAGNOSIS — E669 Obesity, unspecified: Secondary | ICD-10-CM | POA: Diagnosis not present

## 2019-05-25 DIAGNOSIS — J9811 Atelectasis: Secondary | ICD-10-CM | POA: Diagnosis not present

## 2019-05-25 DIAGNOSIS — T501X5A Adverse effect of loop [high-ceiling] diuretics, initial encounter: Secondary | ICD-10-CM | POA: Diagnosis not present

## 2019-05-25 DIAGNOSIS — Z79890 Hormone replacement therapy (postmenopausal): Secondary | ICD-10-CM

## 2019-05-25 DIAGNOSIS — M25561 Pain in right knee: Secondary | ICD-10-CM | POA: Diagnosis not present

## 2019-05-25 HISTORY — PX: LEFT HEART CATH AND CORONARY ANGIOGRAPHY: CATH118249

## 2019-05-25 SURGERY — LEFT HEART CATH AND CORONARY ANGIOGRAPHY
Anesthesia: LOCAL

## 2019-05-25 MED ORDER — NITROGLYCERIN IN D5W 200-5 MCG/ML-% IV SOLN
0.0000 ug/min | INTRAVENOUS | Status: DC
  Administered 2019-05-25: 5 ug/min via INTRAVENOUS
  Administered 2019-05-26: 18:00:00 25 ug/min via INTRAVENOUS
  Administered 2019-05-26: 20 ug/min via INTRAVENOUS
  Administered 2019-05-26: 30 ug/min via INTRAVENOUS
  Administered 2019-05-27: 08:00:00 25 ug/min via INTRAVENOUS
  Administered 2019-05-27: 11:00:00 10 ug/min via INTRAVENOUS
  Administered 2019-05-27: 09:00:00 15 ug/min via INTRAVENOUS
  Administered 2019-05-27: 09:00:00 20 ug/min via INTRAVENOUS
  Administered 2019-05-27: 12:00:00 5 ug/min via INTRAVENOUS
  Administered 2019-05-28: 16.6 ug/min via INTRAVENOUS
  Filled 2019-05-25 (×2): qty 250

## 2019-05-25 MED ORDER — VERAPAMIL HCL 2.5 MG/ML IV SOLN
INTRAVENOUS | Status: DC | PRN
  Administered 2019-05-25: 10 mL via INTRA_ARTERIAL

## 2019-05-25 MED ORDER — MIDAZOLAM HCL 2 MG/2ML IJ SOLN
INTRAMUSCULAR | Status: AC
  Filled 2019-05-25: qty 2

## 2019-05-25 MED ORDER — HEPARIN SODIUM (PORCINE) 1000 UNIT/ML IJ SOLN
INTRAMUSCULAR | Status: AC
  Filled 2019-05-25: qty 1

## 2019-05-25 MED ORDER — MIDAZOLAM HCL 2 MG/2ML IJ SOLN
INTRAMUSCULAR | Status: DC | PRN
  Administered 2019-05-25: 2 mg via INTRAVENOUS

## 2019-05-25 MED ORDER — LIDOCAINE HCL (PF) 1 % IJ SOLN
INTRAMUSCULAR | Status: DC | PRN
  Administered 2019-05-25: 2 mL

## 2019-05-25 MED ORDER — FENTANYL CITRATE (PF) 100 MCG/2ML IJ SOLN
INTRAMUSCULAR | Status: AC
  Filled 2019-05-25: qty 2

## 2019-05-25 MED ORDER — LIDOCAINE HCL (PF) 1 % IJ SOLN
INTRAMUSCULAR | Status: AC
  Filled 2019-05-25: qty 30

## 2019-05-25 MED ORDER — SIMVASTATIN 20 MG PO TABS
40.0000 mg | ORAL_TABLET | Freq: Every day | ORAL | Status: DC
  Administered 2019-05-25: 40 mg via ORAL
  Filled 2019-05-25: qty 2

## 2019-05-25 MED ORDER — SODIUM CHLORIDE 0.9 % IV SOLN
250.0000 mL | INTRAVENOUS | Status: DC | PRN
  Administered 2019-05-28: 12:00:00 via INTRAVENOUS

## 2019-05-25 MED ORDER — HEPARIN (PORCINE) IN NACL 1000-0.9 UT/500ML-% IV SOLN
INTRAVENOUS | Status: AC
  Filled 2019-05-25: qty 500

## 2019-05-25 MED ORDER — HEPARIN (PORCINE) IN NACL 1000-0.9 UT/500ML-% IV SOLN
INTRAVENOUS | Status: DC | PRN
  Administered 2019-05-25 (×2): 500 mL

## 2019-05-25 MED ORDER — DIPHENHYDRAMINE HCL 25 MG PO CAPS
25.0000 mg | ORAL_CAPSULE | Freq: Every day | ORAL | Status: DC
  Administered 2019-05-25 – 2019-05-27 (×3): 25 mg via ORAL
  Filled 2019-05-25 (×3): qty 1

## 2019-05-25 MED ORDER — SODIUM CHLORIDE 0.9 % IV SOLN: INTRAVENOUS | Status: AC

## 2019-05-25 MED ORDER — SODIUM CHLORIDE 0.9 % WEIGHT BASED INFUSION: 1.0000 mL/kg/h | INTRAVENOUS | Status: DC

## 2019-05-25 MED ORDER — LEVOTHYROXINE SODIUM 112 MCG PO TABS
112.0000 ug | ORAL_TABLET | Freq: Every day | ORAL | Status: DC
  Administered 2019-05-26 – 2019-06-02 (×8): 112 ug via ORAL
  Filled 2019-05-25 (×9): qty 1

## 2019-05-25 MED ORDER — SODIUM CHLORIDE 0.9% FLUSH: 3.0000 mL | INTRAVENOUS | Status: DC | PRN

## 2019-05-25 MED ORDER — NITROGLYCERIN 0.4 MG SL SUBL: 0.4000 mg | SUBLINGUAL_TABLET | SUBLINGUAL | Status: DC | PRN

## 2019-05-25 MED ORDER — CARVEDILOL 6.25 MG PO TABS
6.2500 mg | ORAL_TABLET | Freq: Two times a day (BID) | ORAL | Status: DC
  Administered 2019-05-25 – 2019-05-27 (×4): 6.25 mg via ORAL
  Filled 2019-05-25 (×4): qty 1

## 2019-05-25 MED ORDER — HEPARIN (PORCINE) 25000 UT/250ML-% IV SOLN
1000.0000 [IU]/h | INTRAVENOUS | Status: DC
  Administered 2019-05-25: 23:00:00 850 [IU]/h via INTRAVENOUS
  Administered 2019-05-27: 950 [IU]/h via INTRAVENOUS
  Filled 2019-05-25 (×2): qty 250

## 2019-05-25 MED ORDER — LABETALOL HCL 5 MG/ML IV SOLN
INTRAVENOUS | Status: AC
  Filled 2019-05-25: qty 4

## 2019-05-25 MED ORDER — LABETALOL HCL 5 MG/ML IV SOLN
10.0000 mg | INTRAVENOUS | Status: AC | PRN
  Administered 2019-05-25: 10 mg via INTRAVENOUS

## 2019-05-25 MED ORDER — LISINOPRIL 20 MG PO TABS
40.0000 mg | ORAL_TABLET | Freq: Every day | ORAL | Status: DC
  Administered 2019-05-26 – 2019-05-27 (×2): 40 mg via ORAL
  Filled 2019-05-25 (×2): qty 1

## 2019-05-25 MED ORDER — HYDRALAZINE HCL 20 MG/ML IJ SOLN: 10.0000 mg | INTRAMUSCULAR | Status: AC | PRN

## 2019-05-25 MED ORDER — ASPIRIN 81 MG PO CHEW: 81.0000 mg | CHEWABLE_TABLET | ORAL | Status: DC

## 2019-05-25 MED ORDER — ASPIRIN EC 81 MG PO TBEC
81.0000 mg | DELAYED_RELEASE_TABLET | Freq: Every day | ORAL | Status: DC
  Administered 2019-05-26 – 2019-05-27 (×2): 81 mg via ORAL
  Filled 2019-05-25 (×2): qty 1

## 2019-05-25 MED ORDER — SODIUM CHLORIDE 0.9% FLUSH: 3.0000 mL | Freq: Two times a day (BID) | INTRAVENOUS | Status: DC

## 2019-05-25 MED ORDER — IBUPROFEN 200 MG PO TABS
400.0000 mg | ORAL_TABLET | Freq: Four times a day (QID) | ORAL | Status: DC | PRN
  Administered 2019-05-25: 22:00:00 400 mg via ORAL
  Filled 2019-05-25: qty 2

## 2019-05-25 MED ORDER — PANTOPRAZOLE SODIUM 40 MG PO TBEC
40.0000 mg | DELAYED_RELEASE_TABLET | Freq: Every day | ORAL | Status: DC
  Administered 2019-05-26 – 2019-05-27 (×2): 40 mg via ORAL
  Filled 2019-05-25 (×2): qty 1

## 2019-05-25 MED ORDER — IOHEXOL 350 MG/ML SOLN
INTRAVENOUS | Status: DC | PRN
  Administered 2019-05-25: 65 mL

## 2019-05-25 MED ORDER — SODIUM CHLORIDE 0.9 % WEIGHT BASED INFUSION
3.0000 mL/kg/h | INTRAVENOUS | Status: DC
  Administered 2019-05-25: 10:00:00 3 mL/kg/h via INTRAVENOUS

## 2019-05-25 MED ORDER — FENTANYL CITRATE (PF) 100 MCG/2ML IJ SOLN
INTRAMUSCULAR | Status: DC | PRN
  Administered 2019-05-25: 25 ug via INTRAVENOUS

## 2019-05-25 MED ORDER — VERAPAMIL HCL 2.5 MG/ML IV SOLN
INTRAVENOUS | Status: AC
  Filled 2019-05-25: qty 2

## 2019-05-25 MED ORDER — ONDANSETRON HCL 4 MG/2ML IJ SOLN: 4.0000 mg | Freq: Four times a day (QID) | INTRAMUSCULAR | Status: DC | PRN

## 2019-05-25 MED ORDER — HEPARIN SODIUM (PORCINE) 1000 UNIT/ML IJ SOLN
INTRAMUSCULAR | Status: DC | PRN
  Administered 2019-05-25: 4500 [IU] via INTRAVENOUS

## 2019-05-25 SURGICAL SUPPLY — 12 items
CATH INFINITI 5FR ANG PIGTAIL (CATHETERS) ×1 IMPLANT
CATH OPTITORQUE TIG 4.0 5F (CATHETERS) ×1 IMPLANT
DEVICE RAD COMP TR BAND LRG (VASCULAR PRODUCTS) ×1 IMPLANT
GLIDESHEATH SLEND SS 6F .021 (SHEATH) ×1 IMPLANT
GUIDEWIRE INQWIRE 1.5J.035X260 (WIRE) IMPLANT
INQWIRE 1.5J .035X260CM (WIRE) ×2
KIT HEART LEFT (KITS) ×2 IMPLANT
PACK CARDIAC CATHETERIZATION (CUSTOM PROCEDURE TRAY) ×2 IMPLANT
SHEATH PROBE COVER 6X72 (BAG) ×1 IMPLANT
SYR MEDRAD MARK 7 150ML (SYRINGE) ×2 IMPLANT
TRANSDUCER W/STOPCOCK (MISCELLANEOUS) ×2 IMPLANT
TUBING CIL FLEX 10 FLL-RA (TUBING) ×2 IMPLANT

## 2019-05-25 NOTE — Consult Note (Signed)
[address]       [place] [address]             [phone]        Hannah Morrow Record [id number] Date of Birth: 05/01/1941  Referring: Dr. Ellyn Hack, MD Primary Care: [name], NP Primary Cardiologist: Dr. Gwenlyn Found, MD  Chief Complaint:   Chest tightness Reason for consultation: Coronary artery disease   History of Present Illness:     This is a 78 year old female with a past medical history of hypertension and hyperlipidemia who since the end of October has been experiencing chest tightness with ambulation.  Originally the pain started at rest and then was relieved spontaneously .  On one occasion she had pain EMS came to her house but by the time they got there the pain had resolved and she was not admitted . She  was seen by Dr. Gwenlyn Found on 05/09/2019 and a coronary CTA was scheduled for December however, this could not be done until December. The patient called the cardiology office on 11/17 because she had another episode of chest tightness while walking the dog. She describes pain radiation down her left arm but no associated shortness of breath, nausea, or diaphoresis.  Patient presented today for cardiac catheterization, which was done by Dr. Ellyn Hack. Results showed LVEF 50-55%, ostial LAD with 95% stenosis and ramus with a 70% stenosis.Cardiac surgery consult requested and patient admitted .At the time of my exam, patient had no chest pain or tightness, no shortness of breath and vitas signs stable, except she was hypertensive (151/91). Of note, she is COVID negative.   Current Activity/ Functional Status: Patient is independent with mobility/ambulation, transfers, ADL's, IADL's.   Zubrod Score: At the time of surgery this patient's most appropriate activity status/level should be described as: []     0    Normal activity, no symptoms [x]     1    Restricted in physical strenuous activity but ambulatory, able to do out light  work []     2    Ambulatory and capable of self care, unable to do work activities, up and about more than 50%  Of the time                            []     3    Only limited self care, in bed greater than 50% of waking hours []     4    Completely disabled, no self care, confined to bed or chair []     5    Moribund  Past Medical History:  Diagnosis Date  . Allergy    no meds  . ANXIETY   . Cancer [facility])    cervical cancer - hysterectomy  . GERD   . Hyperlipidemia   . Hypertension   . Hypothyroid   . Plantar fasciitis    right  . RESTLESS LEGS SYNDROME     Past Surgical History:  Procedure Laterality Date  . ABDOMINAL HYSTERECTOMY    . BREAST SURGERY     Bilateral breast reduction  . COLOSTOMY  04-22-2010   Hannah Morrow  . cone bx     cervix  . REDUCTION MAMMAPLASTY Bilateral   . WISDOM TOOTH EXTRACTION      Social History   Tobacco Use  Smoking Status Never Smoker  Smokeless Tobacco Never Used  Patient did smoke briefly when she was  in her 35's   Social History   Substance and Sexual Activity  Alcohol Use Yes  . Alcohol/week: 5.0 standard drinks  . Types: 5 Glasses of wine per week   Comment: Occasional     Allergies  Allergen Reactions  . Penicillins Shortness Of Breath    red face Did it involve swelling of the face/tongue/throat, SOB, or low BP? Yes Did it involve sudden or severe rash/hives, skin peeling, or any reaction on the inside of your mouth or nose? Yes Did you need to seek medical attention at a hospital or doctor's office? Yes When did it last happen?40+ years If all above answers are "NO", may proceed with cephalosporin use.   . Amlodipine     Pt states it made her feel "awful and weird."  . Metoprolol Other (See Comments)    " feel weird"  . Betadine [Povidone Iodine] Rash  . Hydrocodone-Acetaminophen Rash    ras    Current Facility-Administered Medications  Medication Dose Route Frequency Provider Last Rate Last Dose  . 0.9%  sodium chloride infusion  1 mL/kg/hr Intravenous Continuous Erlene Quan, PA-C 88 mL/hr at 05/25/19 1030 1 mL/kg/hr at 05/25/19 1030  . aspirin chewable tablet 81 mg  81 mg Oral Pre-Cath Kilroy, Luke K, [place]      . sodium chloride flush (NS) 0.9 % injection 3 mL  3 mL Intravenous PRN Kerin Ransom K, PA-C        Medications Prior to Admission  Medication Sig Dispense Refill Last Dose  . Ascorbic Acid (VITAMIN C) 1000 MG tablet Take 1,000 mg by mouth daily.   05/24/2019 at Unknown time  . aspirin EC 81 MG tablet Take 1 tablet (81 mg total) by mouth daily. 90 tablet 3 05/25/2019 at 0730  . b complex vitamins tablet Take 1 tablet by mouth daily.   05/24/2019 at Unknown time  . calcium carbonate (TUMS - DOSED IN MG ELEMENTAL CALCIUM) 500 MG chewable tablet Chew 1 tablet by mouth daily as needed for indigestion or heartburn.    05/24/2019 at 2230  . carvedilol (COREG) 6.25 MG tablet Take 1 tablet (6.25 mg total) by mouth 2 (two) times daily with a meal. 180 tablet 1 05/25/2019 at 0730  . Cholecalciferol (VITAMIN D) 2000 UNITS CAPS Take by mouth.   05/24/2019 at Unknown time  . diphenhydrAMINE (BENADRYL) 25 MG tablet Take 25 mg by mouth at bedtime.   05/24/2019 at Unknown time  . fluocinonide ointment (LIDEX) AB-123456789 % Apply 1 application topically daily as needed (itching).   Past Week at Unknown time  . ibuprofen (ADVIL) 200 MG tablet Take 400 mg by mouth every 6 (six) hours as needed for headache or moderate pain.   Past Week at Unknown time  . LEVOXYL 112 MCG tablet TAKE 1 TABLET DAILY (Patient taking differently: Take 112 mcg by mouth daily. ) 90 tablet 3 05/25/2019 at 0730  . lisinopril (PRINIVIL,ZESTRIL) 40 MG tablet TAKE 1 TABLET DAILY (Patient taking differently: Take 40 mg by mouth daily. ) 90 tablet 3 05/24/2019 at 0800  . nitroGLYCERIN (NITROSTAT) 0.4 MG SL tablet Place 1 tablet (0.4 mg total) under the tongue every 5 (five) minutes as needed for chest pain. 25 tablet 3   . omeprazole  (PRILOSEC) 20 MG capsule TAKE 1 CAPSULE DAILY (Patient taking differently: Take 20 mg by mouth daily. ) 90 capsule 3 Past Week at Unknown time  . simvastatin (ZOCOR) 40 MG tablet TAKE 1 TABLET AT BEDTIME (Patient taking  differently: Take 40 mg by mouth at bedtime. ) 90 tablet 2 05/24/2019 at 2230  . metoprolol tartrate (LOPRESSOR) 50 MG tablet Take 2 hours prior to CT 1 tablet 0     Family History  Problem Relation Age of Onset  . Cancer Sister        melanoma- with mets  . Colon cancer Father 63  . Stomach cancer Maternal Grandfather   . Breast cancer Mother 35  . Breast cancer Daughter 58  . Rectal cancer Neg Hx   . Esophageal cancer Neg Hx   Patient is married-her husband is 25 years older than she, he has balance issues and walks with a cane Patient is retired and was an Haematologist   Review of Systems:     Cardiac Review of Systems: Y or  [  N  ]= no  Chest Pain [ N   ]  Resting SOB [ N  ] Exertional SOB  [ N ]  Orthopnea [ N ]   Pedal Edema [ N  ]    Palpitations Aqua.Slicker  ] Syncope  [ N ]   Presyncope [ N  ]  General Review of Systems: [Y] = yes [N  ]=no Constitional:  anorexia [ N ];  nausea [N  ]; night sweats [  N]; fever [ N]; or chills [ N ]                                                               Dental: Last Dentist visit: About 3 weeks ago  Eye : blurred vision [ N ];  Amaurosis fugax[ N ]; Resp: cough [ N ];  wheezing[ N ];  hemoptysis[N  ];  GI: vomiting[ N ];  dysphagia[ N ]; melena[N  ];  hematochezia [  N];  GU:  hematuria[ N ];               Skin: rash, swelling[ N ];, Musculosketetal:  back pain[ N ];  Heme/Lymph:   bleeding[ N ];  anemia[N  ];  Neuro: Sharlene.Ates  ];    stroke[ N ];  vertigo[N  ];  seizures[ N ];     Psych:depression[N  ]; anxiety[ N ];  Endocrine: diabetes[ N ];  thyroid dysfunction[Y  ];                Physical Exam: BP (!) 171/88   Pulse 77   Temp (!) 97.2 F (36.2 C) (Skin)   Resp 13   Ht 5\' 2"  (1.575 m)   Wt 86.2 kg   SpO2 100%   BMI  34.75 kg/m    General appearance: alert, cooperative and no distress Head: Normocephalic, without obvious abnormality, atraumatic Neck: no carotid bruit, no JVD and supple, symmetrical, trachea midline Resp: Clear to auscultation bilaterally. Well healed scars below breasts (breast reduction surgery) Cardio: RRR, no murmur GI: Soft, non tender, protuberant, bowel sounds present Extremities: Trace LE edema. Palpable pulses bialterally in feet Neurologic: Grossly normal Patient has varicose veins both legs below knees, left handed , two iv in the left arm  Diagnostic Studies & Laboratory data: LEFT HEART CATH AND CORONARY ANGIOGRAPHY  Conclusion    There is mild left ventricular systolic dysfunction. The left ventricular ejection fraction is 50-55%  by visual estimate.  -------------------------------------  SEVERE TWO-VESSEL DISEASE  Ost LAD lesion is 95% stenosed.  Prox LAD lesion is 90% stenosed ->Prox LAD to Mid LAD lesion is 40% stenosed with 40% stenosed side branch in 1st Diag.  Ramus lesion is 70% stenosed.   SUMMARY: SEVERE TWO-VESSEL DISEASE:  -Ostial LAD 95% ulcerated lesion followed by 90% proximal LAD just prior to1st Diag extending beyond the branch.  -70% proximal large branching RAMUS INTERMEDIUS (RI)  Preserved EF of 50 to 55% with anteroapical-apical-inferoapical hypokinesis   RECOMMENDATIONS  Based on the severity and location of her disease, in reviewing with primary cardiologist, we both feel like the safest option is for her to be admitted under telemetry for monitoring until she is able to undergo CABG.  We have consulted CVTS.  We will start IV heparin after 8 hours from TR band placement  Initiate IV nitroglycerin infusion for blood pressure and antianginal effect.  We will restart patient's home blood pressure medications (has both carvedilol and metoprolol written for, will use carvedilol for better blood pressure control)  Check 2D  echocardiogram.  Other pre-CABG test will be ordered by CVTS.   Dominance: Right Left Main  Vessel is large.  Left Anterior Descending  Ost LAD lesion 95% stenosed  Ost LAD lesion is 95% stenosed. The lesion is located at the major branch, focal, discrete, concentric and ulcerative.  Prox LAD lesion 90% stenosed  Prox LAD lesion is 90% stenosed. The lesion is concentric and irregular.  Prox LAD to Mid LAD lesion 40% stenosed with side branch in 1st Diag 40% stenosed  Prox LAD to Mid LAD lesion is 40% stenosed with 40% stenosed side branch in 1st Diag.  First Diagonal Branch  Vessel is moderate in size.  Second Diagonal Branch  Vessel is small in size.  Left Anterior Descending  Ost LAD lesion 95% stenosed  Ost LAD lesion is 95% stenosed. The lesion is located at the major branch, focal, discrete, concentric and ulcerative.  Prox LAD lesion 90% stenosed  Prox LAD lesion is 90% stenosed. The lesion is concentric and irregular.  Prox LAD to Mid LAD lesion 40% stenosed with side branch in 1st Diag 40% stenosed  Prox LAD to Mid LAD lesion is 40% stenosed with 40% stenosed side branch in 1st Diag.  First Diagonal Branch  Vessel is moderate in size.  Second Diagonal Branch  Vessel is small in size.  Ramus Intermedius  Vessel is large  Ramus lesion 70% stenosed  Ramus lesion is 70% stenosed. The lesion is focal, concentric and irregular.  Left Circumflex  Vessel is moderate in size.  Right Coronary Artery  Vessel is large.  First Right Posterolateral Branch  Vessel is small in size.  Intervention  No interventions have been documented. Wall Motion  Resting                  Recent Radiology Findings:  No chest xray has been done    I have independently reviewed the above radiologic studies and discussed with the patient   Recent Lab Findings: Lab Results  Component Value Date   WBC 7.6 05/23/2019   HGB 14.0 05/23/2019   HCT 44.7 05/23/2019   PLT 234 05/23/2019    GLUCOSE 87 05/23/2019   CHOL 199 07/28/2018   TRIG 195.0 (H) 07/28/2018   HDL 54.60 07/28/2018   LDLDIRECT 107.0 07/26/2017   LDLCALC 106 (H) 07/28/2018   ALT 26 07/28/2018   AST 25 07/28/2018  NA 139 05/23/2019   K 4.8 05/23/2019   CL 100 05/23/2019   CREATININE 1.30 (H) 05/23/2019   BUN 28 (H) 05/23/2019   CO2 20 05/23/2019   TSH 1.15 07/28/2018   HGBA1C 5.7 04/08/2009   Assessment / Plan:   1. Coronary artery disease-high-grade two-vessel disease with unstable anginal symptoms.  I reviewed the films and studies with the patient and agree with the recommendation to proceed with coronary artery bypass graft.  With her unstable symptoms it is appropriate that she is admitted to the hospital, heparin is to be started . We will continue pre op work up of echo, carotid US etc. plan coronary artery bypass grafting on Monday 2. History of hypertension-on Lisinopril 40 mg daily  3. History of hyperlipidemia-on Simvastatin 40 mg at hs prior to admission 4. History of hypothyroidism-on Levoxyl 112 mcg daily prior to admission     Lars Pinks PA-C 05/25/2019 3:35 PM  I have seen and examined the patient reviewed the films with her and developed the above plan With her varicose veins we will also obtain lower extremity vein mapping Risks and options of surgery were discussed with her and she is willing to proceed.  I have seen and examined Hannah Morrow and agree with the above assessment  and plan.  Grace Isaac MD Beeper [phone] Office [phone] 05/25/2019 5:20 PM

## 2019-05-25 NOTE — Progress Notes (Signed)
ANTICOAGULATION CONSULT NOTE - Initial Consult  Pharmacy Consult for heparin  Indication: chest pain/ACS  Allergies  Allergen Reactions  . Penicillins Shortness Of Breath    red face Did it involve swelling of the face/tongue/throat, SOB, or low BP? Yes Did it involve sudden or severe rash/hives, skin peeling, or any reaction on the inside of your mouth or nose? Yes Did you need to seek medical attention at a hospital or doctor's office? Yes When did it last happen?40+ years If all above answers are "NO", may proceed with cephalosporin use.   . Amlodipine     Pt states it made her feel "awful and weird."  . Metoprolol Other (See Comments)    " feel weird"  . Betadine [Povidone Iodine] Rash  . Hydrocodone-Acetaminophen Rash    ras    Patient Measurements: Height: 5\' 2"  (157.5 cm) Weight: 190 lb (86.2 kg) IBW/kg (Calculated) : 50.1 Heparin Dosing Weight: 70kg  Vital Signs: Temp: 97.2 F (36.2 C) (11/20 0905) Temp Source: Skin (11/20 0905) BP: 151/91 (11/20 1605) Pulse Rate: 73 (11/20 1605)  Labs: Recent Labs    05/23/19 1403  HGB 14.0  HCT 44.7  PLT 234  CREATININE 1.30*    Estimated Creatinine Clearance: 36.9 mL/min (A) (by C-G formula based on SCr of 1.3 mg/dL (H)).   Medical History: Past Medical History:  Diagnosis Date  . Allergy    no meds  . ANXIETY   . Cancer [facility])    cervical cancer - hysterectomy  . GERD   . Hyperlipidemia   . Hypertension   . Hypothyroid   . Plantar fasciitis    right  . RESTLESS LEGS SYNDROME     Medications:  Medications Prior to Admission  Medication Sig Dispense Refill Last Dose  . Ascorbic Acid (VITAMIN C) 1000 MG tablet Take 1,000 mg by mouth daily.   05/24/2019 at Unknown time  . aspirin EC 81 MG tablet Take 1 tablet (81 mg total) by mouth daily. 90 tablet 3 05/25/2019 at 0730  . b complex vitamins tablet Take 1 tablet by mouth daily.   05/24/2019 at Unknown time  . calcium carbonate (TUMS - DOSED IN MG  ELEMENTAL CALCIUM) 500 MG chewable tablet Chew 1 tablet by mouth daily as needed for indigestion or heartburn.    05/24/2019 at 2230  . carvedilol (COREG) 6.25 MG tablet Take 1 tablet (6.25 mg total) by mouth 2 (two) times daily with a meal. 180 tablet 1 05/25/2019 at 0730  . Cholecalciferol (VITAMIN D) 2000 UNITS CAPS Take by mouth.   05/24/2019 at Unknown time  . diphenhydrAMINE (BENADRYL) 25 MG tablet Take 25 mg by mouth at bedtime.   05/24/2019 at Unknown time  . fluocinonide ointment (LIDEX) AB-123456789 % Apply 1 application topically daily as needed (itching).   Past Week at Unknown time  . ibuprofen (ADVIL) 200 MG tablet Take 400 mg by mouth every 6 (six) hours as needed for headache or moderate pain.   Past Week at Unknown time  . LEVOXYL 112 MCG tablet TAKE 1 TABLET DAILY (Patient taking differently: Take 112 mcg by mouth daily. ) 90 tablet 3 05/25/2019 at 0730  . lisinopril (PRINIVIL,ZESTRIL) 40 MG tablet TAKE 1 TABLET DAILY (Patient taking differently: Take 40 mg by mouth daily. ) 90 tablet 3 05/24/2019 at 0800  . nitroGLYCERIN (NITROSTAT) 0.4 MG SL tablet Place 1 tablet (0.4 mg total) under the tongue every 5 (five) minutes as needed for chest pain. 25 tablet 3   .  omeprazole (PRILOSEC) 20 MG capsule TAKE 1 CAPSULE DAILY (Patient taking differently: Take 20 mg by mouth daily. ) 90 capsule 3 Past Week at Unknown time  . simvastatin (ZOCOR) 40 MG tablet TAKE 1 TABLET AT BEDTIME (Patient taking differently: Take 40 mg by mouth at bedtime. ) 90 tablet 2 05/24/2019 at 2230  . metoprolol tartrate (LOPRESSOR) 50 MG tablet Take 2 hours prior to CT 1 tablet 0     Assessment: 78 yo female s/p cath with plans for CABG. Pharmacy consulted to start heparin 8 hours after sheath removal (removed ~ 3pm)  Goal of Therapy:  Heparin level 0.3-0.7 units/ml Monitor platelets by anticoagulation protocol: Yes   Plan:  -Start heparin at 850 units/hr at 11pm -Heparin level in 8 hours and daily wth CBC  daily  Hildred Laser, PharmD Clinical Pharmacist **Pharmacist phone directory can now be found on [email or web address] ([email or web address]).  Listed under [place].

## 2019-05-25 NOTE — Interval H&P Note (Signed)
History and Physical Interval Note:  05/25/2019 2:30 PM  Hannah Morrow  has presented today for surgery, with the diagnosis of Chest pain - Progressive Angina.  The various methods of treatment have been discussed with the patient and family. After consideration of risks, benefits and other options for treatment, the patient has consented to  Procedure(s): LEFT HEART CATH AND CORONARY ANGIOGRAPHY (N/A)  PERCUTANEOUS CORONARY INTERVENTION  as a surgical intervention.  The patient's history has been reviewed, patient examined, no change in status, stable for surgery.  I have reviewed the patient's chart and labs.  Questions were answered to the patient's satisfaction.   Cath Lab Visit (complete for each Cath Lab visit)  Clinical Evaluation Leading to the Procedure:   ACS: No.  Non-ACS:    Anginal Classification: CCS IV  Anti-ischemic medical therapy: Minimal Therapy (1 class of medications)  Non-Invasive Test Results: No non-invasive testing performed  Prior CABG: No previous CABG    Glenetta Hew

## 2019-05-26 ENCOUNTER — Inpatient Hospital Stay (HOSPITAL_COMMUNITY): Payer: Medicare Other

## 2019-05-26 ENCOUNTER — Encounter (HOSPITAL_COMMUNITY): Payer: Medicare Other

## 2019-05-26 ENCOUNTER — Other Ambulatory Visit (HOSPITAL_COMMUNITY): Payer: Medicare Other

## 2019-05-26 ENCOUNTER — Encounter (HOSPITAL_COMMUNITY): Payer: Self-pay

## 2019-05-26 DIAGNOSIS — I25110 Atherosclerotic heart disease of native coronary artery with unstable angina pectoris: Secondary | ICD-10-CM

## 2019-05-26 DIAGNOSIS — I200 Unstable angina: Secondary | ICD-10-CM

## 2019-05-26 LAB — CBC
HCT: 39.1 % (ref 36.0–46.0)
Hemoglobin: 12.4 g/dL (ref 12.0–15.0)
MCH: 25.3 pg — ABNORMAL LOW (ref 26.0–34.0)
MCHC: 31.7 g/dL (ref 30.0–36.0)
MCV: 79.8 fL — ABNORMAL LOW (ref 80.0–100.0)
Platelets: 178 10*3/uL (ref 150–400)
RBC: 4.9 MIL/uL (ref 3.87–5.11)
RDW: 13.1 % (ref 11.5–15.5)
WBC: 6.9 10*3/uL (ref 4.0–10.5)
nRBC: 0 % (ref 0.0–0.2)

## 2019-05-26 LAB — ECHOCARDIOGRAM COMPLETE
Height: 62 in
Weight: 3060.8 oz

## 2019-05-26 LAB — HEPARIN LEVEL (UNFRACTIONATED)
Heparin Unfractionated: 0.26 IU/mL — ABNORMAL LOW (ref 0.30–0.70)
Heparin Unfractionated: 0.37 IU/mL (ref 0.30–0.70)

## 2019-05-26 MED ORDER — EPINEPHRINE HCL 5 MG/250ML IV SOLN IN NS
0.0000 ug/min | INTRAVENOUS | Status: DC
  Filled 2019-05-26: qty 250

## 2019-05-26 MED ORDER — TRANEXAMIC ACID 1000 MG/10ML IV SOLN
1.5000 mg/kg/h | INTRAVENOUS | Status: AC
  Administered 2019-05-28: 1.5 mg/kg/h via INTRAVENOUS
  Filled 2019-05-26: qty 25

## 2019-05-26 MED ORDER — MILRINONE LACTATE IN DEXTROSE 20-5 MG/100ML-% IV SOLN
0.3000 ug/kg/min | INTRAVENOUS | Status: DC
  Filled 2019-05-26: qty 100

## 2019-05-26 MED ORDER — LEVOFLOXACIN IN D5W 500 MG/100ML IV SOLN
500.0000 mg | INTRAVENOUS | Status: AC
  Administered 2019-05-28: 500 mg via INTRAVENOUS
  Filled 2019-05-26: qty 100

## 2019-05-26 MED ORDER — ATORVASTATIN CALCIUM 80 MG PO TABS
80.0000 mg | ORAL_TABLET | Freq: Every day | ORAL | Status: DC
  Administered 2019-05-26 – 2019-06-01 (×7): 80 mg via ORAL
  Filled 2019-05-26 (×7): qty 1

## 2019-05-26 MED ORDER — TRANEXAMIC ACID (OHS) BOLUS VIA INFUSION
15.0000 mg/kg | INTRAVENOUS | Status: AC
  Administered 2019-05-28: 1302 mg via INTRAVENOUS
  Filled 2019-05-26: qty 1302

## 2019-05-26 MED ORDER — HEPARIN BOLUS VIA INFUSION
1000.0000 [IU] | Freq: Once | INTRAVENOUS | Status: DC
  Filled 2019-05-26: qty 1000

## 2019-05-26 MED ORDER — DOPAMINE-DEXTROSE 3.2-5 MG/ML-% IV SOLN
0.0000 ug/kg/min | INTRAVENOUS | Status: DC
  Filled 2019-05-26: qty 250

## 2019-05-26 MED ORDER — POTASSIUM CHLORIDE 2 MEQ/ML IV SOLN
80.0000 meq | INTRAVENOUS | Status: DC
  Filled 2019-05-26: qty 40

## 2019-05-26 MED ORDER — ACETAMINOPHEN 325 MG PO TABS
650.0000 mg | ORAL_TABLET | Freq: Four times a day (QID) | ORAL | Status: DC | PRN
  Administered 2019-05-26 – 2019-05-27 (×2): 650 mg via ORAL
  Filled 2019-05-26 (×2): qty 2

## 2019-05-26 MED ORDER — TRANEXAMIC ACID (OHS) PUMP PRIME SOLUTION
2.0000 mg/kg | INTRAVENOUS | Status: DC
  Filled 2019-05-26: qty 1.74

## 2019-05-26 MED ORDER — NITROGLYCERIN IN D5W 200-5 MCG/ML-% IV SOLN
2.0000 ug/min | INTRAVENOUS | Status: DC
  Filled 2019-05-26: qty 250

## 2019-05-26 MED ORDER — VANCOMYCIN HCL 10 G IV SOLR
1500.0000 mg | INTRAVENOUS | Status: AC
  Administered 2019-05-28: 1500 mg via INTRAVENOUS
  Filled 2019-05-26: qty 1500

## 2019-05-26 MED ORDER — SODIUM CHLORIDE 0.9 % IV SOLN
INTRAVENOUS | Status: DC
  Filled 2019-05-26: qty 30

## 2019-05-26 MED ORDER — MAGNESIUM SULFATE 50 % IJ SOLN
40.0000 meq | INTRAMUSCULAR | Status: DC
  Filled 2019-05-26: qty 9.85

## 2019-05-26 MED ORDER — INSULIN REGULAR(HUMAN) IN NACL 100-0.9 UT/100ML-% IV SOLN
INTRAVENOUS | Status: AC
  Administered 2019-05-28: .7 [IU]/h via INTRAVENOUS
  Filled 2019-05-26 (×2): qty 100

## 2019-05-26 MED ORDER — PHENYLEPHRINE HCL-NACL 20-0.9 MG/250ML-% IV SOLN
30.0000 ug/min | INTRAVENOUS | Status: AC
  Administered 2019-05-28: 10 ug/min via INTRAVENOUS
  Filled 2019-05-26: qty 250

## 2019-05-26 MED ORDER — DEXMEDETOMIDINE HCL IN NACL 400 MCG/100ML IV SOLN
0.1000 ug/kg/h | INTRAVENOUS | Status: AC
  Administered 2019-05-28: 0.4 ug/kg/h via INTRAVENOUS
  Filled 2019-05-26: qty 100

## 2019-05-26 MED ORDER — PLASMA-LYTE 148 IV SOLN
INTRAVENOUS | Status: DC
  Filled 2019-05-26: qty 2.5

## 2019-05-26 NOTE — Progress Notes (Signed)
ANTICOAGULATION CONSULT NOTE - Initial Consult  Pharmacy Consult for heparin  Indication: chest pain/ACS  Allergies  Allergen Reactions  . Penicillins Shortness Of Breath    red face Did it involve swelling of the face/tongue/throat, SOB, or low BP? Yes Did it involve sudden or severe rash/hives, skin peeling, or any reaction on the inside of your mouth or nose? Yes Did you need to seek medical attention at a hospital or doctor's office? Yes When did it last happen?40+ years If all above answers are "NO", may proceed with cephalosporin use.   . Amlodipine     Pt states it made her feel "awful and weird."  . Metoprolol Other (See Comments)    " feel weird"  . Betadine [Povidone Iodine] Rash  . Hydrocodone-Acetaminophen Rash    ras    Patient Measurements: Height: 5\' 2"  (157.5 cm) Weight: 191 lb 4.8 oz (86.8 kg) IBW/kg (Calculated) : 50.1 Heparin Dosing Weight: 70kg  Vital Signs: Temp: 98.2 F (36.8 C) (11/21 0505) Temp Source: Oral (11/21 0505) BP: 128/47 (11/21 0505) Pulse Rate: 66 (11/21 0505)  Labs: Recent Labs    05/23/19 1403 05/26/19 0440 05/26/19 0649  HGB 14.0 12.4  --   HCT 44.7 39.1  --   PLT 234 178  --   HEPARINUNFRC  --   --  0.26*  CREATININE 1.30*  --   --     Estimated Creatinine Clearance: 37.1 mL/min (A) (by C-G formula based on SCr of 1.3 mg/dL (H)).   Medical History: Past Medical History:  Diagnosis Date  . Allergy    no meds  . ANXIETY   . Cancer [facility])    cervical cancer - hysterectomy  . GERD   . Hyperlipidemia   . Hypertension   . Hypothyroid   . Plantar fasciitis    right  . RESTLESS LEGS SYNDROME     Medications:  Medications Prior to Admission  Medication Sig Dispense Refill Last Dose  . Ascorbic Acid (VITAMIN C) 1000 MG tablet Take 1,000 mg by mouth daily.   05/24/2019 at Unknown time  . aspirin EC 81 MG tablet Take 1 tablet (81 mg total) by mouth daily. 90 tablet 3 05/25/2019 at 0730  . b complex vitamins  tablet Take 1 tablet by mouth daily.   05/24/2019 at Unknown time  . calcium carbonate (TUMS - DOSED IN MG ELEMENTAL CALCIUM) 500 MG chewable tablet Chew 1 tablet by mouth daily as needed for indigestion or heartburn.    05/24/2019 at 2230  . carvedilol (COREG) 6.25 MG tablet Take 1 tablet (6.25 mg total) by mouth 2 (two) times daily with a meal. 180 tablet 1 05/25/2019 at 0730  . Cholecalciferol (VITAMIN D) 2000 UNITS CAPS Take by mouth.   05/24/2019 at Unknown time  . diphenhydrAMINE (BENADRYL) 25 MG tablet Take 25 mg by mouth at bedtime.   05/24/2019 at Unknown time  . fluocinonide ointment (LIDEX) [id number] % Apply 1 application topically daily as needed (itching).   Past Week at Unknown time  . ibuprofen (ADVIL) 200 MG tablet Take 400 mg by mouth every 6 (six) hours as needed for headache or moderate pain.   Past Week at Unknown time  . LEVOXYL 112 MCG tablet TAKE 1 TABLET DAILY (Patient taking differently: Take 112 mcg by mouth daily. ) 90 tablet 3 05/25/2019 at 0730  . lisinopril (PRINIVIL,ZESTRIL) 40 MG tablet TAKE 1 TABLET DAILY (Patient taking differently: Take 40 mg by mouth daily. ) 90 tablet 3 05/24/2019  at 0800  . nitroGLYCERIN (NITROSTAT) 0.4 MG SL tablet Place 1 tablet (0.4 mg total) under the tongue every 5 (five) minutes as needed for chest pain. 25 tablet 3   . omeprazole (PRILOSEC) 20 MG capsule TAKE 1 CAPSULE DAILY (Patient taking differently: Take 20 mg by mouth daily. ) 90 capsule 3 Past Week at Unknown time  . simvastatin (ZOCOR) 40 MG tablet TAKE 1 TABLET AT BEDTIME (Patient taking differently: Take 40 mg by mouth at bedtime. ) 90 tablet 2 05/24/2019 at 2230  . metoprolol tartrate (LOPRESSOR) 50 MG tablet Take 2 hours prior to CT 1 tablet 0     Assessment: 78 yo female s/p cath with plans for CABG. Pharmacy consulted to start heparin 8 hours after sheath removal (removed ~ 3pm on 11/20).  Heparin level is subtherapeutic at 0.26 on 11/21 @ 0649. No issues with infusion reported  by RN. Some slight bleeding is reported around the IV line, but starting to resolve slightly. Hg/Hct dropped but wnls at 12.4/39.1. Plts have also dropped from 234 to 178, but still wnls.  Goal of Therapy:  Heparin level 0.3-0.7 units/ml Monitor platelets by anticoagulation protocol: Yes   Plan:  -Increase heparin to 950 units/hr. (will not bolus due to bleeding around IV site) -Heparin level in 8 hours and daily wth CBC daily -Monitor for signs/symptoms of beeding  Sherren Kerns, PharmD PGY1 Acute Care Pharmacy Resident **Pharmacist phone directory can now be found on [email or web address] ([email or web address]).  Listed under [place].

## 2019-05-26 NOTE — Progress Notes (Signed)
[address]       [place], [address]             [phone]                 1 Day Post-Op Procedure(s) (LRB): LEFT HEART CATH AND CORONARY ANGIOGRAPHY (N/A)  LOS: 1 day   Subjective: No recurrent chest pain now on IV heparin and nitroglycerin  Objective: Vital signs in last 24 hours: Patient Vitals for the past 24 hrs:  BP Temp Temp src Pulse Resp SpO2 Height Weight  05/26/19 1125 (!) 147/75 98 F (36.7 C) Oral 63 18 96 % - -  05/26/19 0841 126/76 97.8 F (36.6 C) Oral 70 20 97 % - -  05/26/19 0505 (!) 128/47 98.2 F (36.8 C) Oral 66 20 97 % - 86.8 kg  05/26/19 0504 (!) 128/47 - - 68 20 97 % - -  05/26/19 0500 (!) 121/54 - - 64 - - - -  05/26/19 0400 124/63 - - 60 - - - -  05/26/19 0300 (!) 124/48 - - 66 - - - -  05/26/19 0200 121/70 - - 69 - - - -  05/26/19 0104 (!) 117/46 - - 66 - - - -  05/26/19 0051 133/68 98.5 F (36.9 C) Oral 70 20 95 % - -  05/26/19 0000 (!) 128/48 - - 70 - - - -  05/25/19 2300 (!) 141/76 - - 68 - - - -  05/25/19 2200 (!) 143/55 - - 73 - - - -  05/25/19 2130 138/63 - - 74 - - - -  05/25/19 2100 138/61 - - 81 - - - -  05/25/19 2049 (!) 140/54 98.3 F (36.8 C) Oral 81 20 97 % - -  05/25/19 2030 (!) 143/66 - - 76 - - - -  05/25/19 2000 (!) 152/71 - - 80 - - - -  05/25/19 1935 (!) 154/72 - - 74 - 98 % - -  05/25/19 1900 (!) 144/77 - - 76 - 98 % - -  05/25/19 1830 (!) 160/80 - - 73 - - - -  05/25/19 1800 (!) 161/78 - - 72 - 98 % - -  05/25/19 1747 (!) 169/72 - - 72 - 97 % - -  05/25/19 1731 (!) 167/71 98.6 F (37 C) Oral 71 - 95 % - -  05/25/19 1725 - - - - - - 5\' 2"  (1.575 m) 86.7 kg  05/25/19 1715 (!) 169/72 98.6 F (37 C) Oral 69 20 98 % - -  05/25/19 1700 (!) 166/69 - - 70 - - - -  05/25/19 1636 (!) 155/71 - - 71 - 100 % - -  05/25/19 1605 (!) 151/91 - - 73 17 98 % - -  05/25/19 1600 (!) 186/54 - - 73 19 97 % - -  05/25/19 1555 (!) 188/63 - - 69 15 98 % - -  05/25/19 1550 (!) 188/83 - - 78 13 96 % - -  05/25/19  1545 (!) 185/74 - - 76 12 97 % - -  05/25/19 1540 (!) 176/95 - - 81 12 99 % - -  05/25/19 1535 (!) 184/81 - - 63 16 94 % - -  05/25/19 1530 (!) 183/71 - - 77 15 100 % - -  05/25/19 1525 (!) 171/88 - - 77 13 100 % - -  05/25/19 1510 (!) 173/87 - - (!) 0 (!) 21 99 % - -  05/25/19 1505 (!) 167/84 - - 71 12 98 % - -  05/25/19 1500 (!) 151/78 - - 72 (!) 32 96 % - -  05/25/19 1455 (!) 157/78 - - 74 14 96 % - -  05/25/19 1450 (!) 153/73 - - 72 15 95 % - -  05/25/19 1450 (!) 153/73 - - 72 (!) 36 95 % - -  05/25/19 1445 - - - 89 (!) 0 (!) 0 % - -  05/25/19 1440 (!) 141/80 - - 65 16 96 % - -  05/25/19 1435 (!) 169/83 - - 69 15 97 % - -  05/25/19 1430 (!) 188/87 - - (!) 114 12 100 % - -  05/25/19 1425 (!) 207/89 - - 75 (!) 21 100 % - -  05/25/19 1425 - - - - - 100 % - -  05/25/19 1424 - - - 70 - - - -    Filed Weights   05/25/19 0905 05/25/19 1725 05/26/19 0505  Weight: 86.2 kg 86.7 kg 86.8 kg    Hemodynamic parameters for last 24 hours:    Intake/Output from previous day: 11/20 0701 - 11/21 0700 In: 1790.1 [P.O.:720; I.V.:1070.1] Out: 950 [Urine:950] Intake/Output this shift: No intake/output data recorded.  Scheduled Meds: . aspirin EC  81 mg Oral Daily  . atorvastatin  80 mg Oral q1800  . carvedilol  6.25 mg Oral BID WC  . diphenhydrAMINE  25 mg Oral QHS  . levothyroxine  112 mcg Oral Q0600  . lisinopril  40 mg Oral Daily  . pantoprazole  40 mg Oral Daily  . sodium chloride flush  3 mL Intravenous Q12H   Continuous Infusions: . sodium chloride    . heparin 950 Units/hr (05/26/19 0812)  . nitroGLYCERIN 15 mcg/min (05/25/19 1848)   PRN Meds:.sodium chloride, acetaminophen, nitroGLYCERIN, ondansetron (ZOFRAN) IV, sodium chloride flush  General appearance: alert and cooperative Neurologic: intact Heart: regular rate and rhythm, S1, S2 normal, no murmur, click, rub or gallop Lungs: clear to auscultation bilaterally Abdomen: soft, non-tender; bowel sounds normal; no masses,   no organomegaly Extremities: IV heparin going in left forearm, bruising right radial cath site  Lab Results: CBC: Recent Labs    05/23/19 1403 05/26/19 0440  WBC 7.6 6.9  HGB 14.0 12.4  HCT 44.7 39.1  PLT 234 178   BMET:  Recent Labs    05/23/19 1403  NA 139  K 4.8  CL 100  CO2 20  GLUCOSE 87  BUN 28*  CREATININE 1.30*  CALCIUM 9.7    PT/INR: No results for input(s): LABPROT, INR in the last 72 hours.   Radiology Dg Chest 2 View  Result Date: 05/26/2019 CLINICAL DATA:  Preop for heart surgery. EXAM: CHEST - 2 VIEW COMPARISON:  None. FINDINGS: Lateral view degraded by patient arm position. Numerous leads and wires project over the chest. Midline trachea. Normal heart size and mediastinal contours. No pleural effusion or pneumothorax. Clear lungs. No congestive failure. IMPRESSION: No acute cardiopulmonary disease. Electronically Signed   By: Abigail Miyamoto M.D.   On: 05/26/2019 10:57     Assessment/Plan: S/P Procedure(s) (LRB): LEFT HEART CATH AND CORONARY ANGIOGRAPHY (N/A)  Plan coronary artery bypass grafting Monday Waiting for preop Doppler studies, and vein mapping, echocardiogram  Discussed plan procedure with the patient her daughter who was present and husband on the telephone, questions were answered   Grace Isaac MD 05/26/2019 1:31 PM

## 2019-05-26 NOTE — Progress Notes (Signed)
Pt had episode of hypertension/possible panic attack.  B/P had spiked to 181/83, nitro gtt titrated until systolic B/P in the [id number].  Spoke with Dr. Fransico Him, MD (cardiology) who modified orders of nitro gtt.  Per Dr. Radford Pax, can maintain nitro gtt at a rate that yields a systolic B/p below 0000000.  For anxiety, night nurse can try Benadryl and if anxiety persists, please page for additional orders. Patient's daughter call at shift change with information that patient has had anxiety/paic attacks in the past.

## 2019-05-26 NOTE — Progress Notes (Signed)
Patient had an additional episodes of intermittent bleeding with heparin gtt that resolved.  Informed night shift RN to call pharmacy and will continue to monitor.

## 2019-05-26 NOTE — Progress Notes (Signed)
Was notified by dayshift that pt is bleeding out of both IV sites, Pharmacy notified and was refered to MD, MD notified.  Will continue to monitor.

## 2019-05-26 NOTE — Progress Notes (Signed)
Progress Note  Patient Name: Hannah Morrow Date of Encounter: 05/26/2019  Primary Cardiologist: Dr Gwenlyn Found  Subjective   No chest pain or SOB  Inpatient Medications    Scheduled Meds: . aspirin EC  81 mg Oral Daily  . carvedilol  6.25 mg Oral BID WC  . diphenhydrAMINE  25 mg Oral QHS  . levothyroxine  112 mcg Oral Q0600  . lisinopril  40 mg Oral Daily  . pantoprazole  40 mg Oral Daily  . simvastatin  40 mg Oral QHS  . sodium chloride flush  3 mL Intravenous Q12H   Continuous Infusions: . sodium chloride    . heparin 850 Units/hr (05/25/19 2316)  . nitroGLYCERIN 15 mcg/min (05/25/19 1848)   PRN Meds: sodium chloride, ibuprofen, nitroGLYCERIN, ondansetron (ZOFRAN) IV, sodium chloride flush   Vital Signs    Vitals:   05/26/19 0400 05/26/19 0500 05/26/19 0504 05/26/19 0505  BP: 124/63 (!) 121/54 (!) 128/47 (!) 128/47  Pulse: 60 64 68 66  Resp:   20 20  Temp:    98.2 F (36.8 C)  TempSrc:    Oral  SpO2:   97% 97%  Weight:    86.8 kg  Height:        Intake/Output Summary (Last 24 hours) at 05/26/2019 0748 Last data filed at 05/26/2019 0600 Gross per 24 hour  Intake 1790.08 ml  Output 950 ml  Net 840.08 ml   Last 3 Weights 05/26/2019 05/25/2019 05/25/2019  Weight (lbs) 191 lb 4.8 oz 191 lb 3.2 oz 190 lb  Weight (kg) 86.773 kg 86.728 kg 86.183 kg      Telemetry    SR - Personally Reviewed  ECG    n/a - Personally Reviewed  Physical Exam   GEN: No acute distress.   Neck: No JVD Cardiac: RRR, no murmurs, rubs, or gallops.  Respiratory: Clear to auscultation bilaterally. GI: Soft, nontender, non-distended  MS: No edema; No deformity. Neuro:  Nonfocal  Psych: Normal affect   Labs    High Sensitivity Troponin:  No results for input(s): TROPONINIHS in the last 720 hours.    Chemistry Recent Labs  Lab 05/23/19 1403  NA 139  K 4.8  CL 100  CO2 20  GLUCOSE 87  BUN 28*  CREATININE 1.30*  CALCIUM 9.7  GFRNONAA 40*  GFRAA 46*      Hematology Recent Labs  Lab 05/23/19 1403 05/26/19 0440  WBC 7.6 6.9  RBC 5.62* 4.90  HGB 14.0 12.4  HCT 44.7 39.1  MCV 80 79.8*  MCH 24.9* 25.3*  MCHC 31.3* 31.7  RDW 12.8 13.1  PLT 234 178    BNPNo results for input(s): BNP, PROBNP in the last 168 hours.   DDimer No results for input(s): DDIMER in the last 168 hours.   Radiology    No results found.  Cardiac Studies    Patient Profile     Hannah Morrow is a 78 y.o. female with a hx of hypertension and hyperlipidemia who was referred to Dr. Gwenlyn Found May 09, 2019 for evaluation of chest pain and referred for outpatient cath.   Assessment & Plan    1. CAD/Chest pain - referred for outpatient cath due to chest pain - cath showed ostail LAD 95%, prox LAD 90%, ramus 70%, LCX patent, RCA patent - being worked up for possible CABG by CT surgery, looks like surgery likely Monday - echo pending  - medical therapy with ASA, coreg 6.25mg  bid, hep gtt, lisinopril 40, nitrgo  gtt, simva 40 - in setting of CAD change to high dose statin, change simva to 80mg  daily.     For questions or updates, please contact [facility] Please consult [email or web address] for contact info under        Signed, Carlyle Dolly, MD  05/26/2019, 7:48 AM

## 2019-05-26 NOTE — Progress Notes (Signed)
ANTICOAGULATION CONSULT NOTE  Pharmacy Consult:  Heparin Indication: chest pain/ACS  Allergies  Allergen Reactions  . Penicillins Shortness Of Breath    red face Did it involve swelling of the face/tongue/throat, SOB, or low BP? Yes Did it involve sudden or severe rash/hives, skin peeling, or any reaction on the inside of your mouth or nose? Yes Did you need to seek medical attention at a hospital or doctor's office? Yes When did it last happen?40+ years If all above answers are "NO", may proceed with cephalosporin use.   . Amlodipine     Pt states it made her feel "awful and weird."  . Metoprolol Other (See Comments)    " feel weird"  . Betadine [Povidone Iodine] Rash  . Hydrocodone-Acetaminophen Rash    ras    Patient Measurements: Height: 5\' 2"  (157.5 cm) Weight: 191 lb 4.8 oz (86.8 kg) IBW/kg (Calculated) : 50.1 Heparin Dosing Weight: 70kg  Vital Signs: Temp: 98.4 F (36.9 C) (11/21 1648) Temp Source: Oral (11/21 1648) BP: 181/83 (11/21 1648) Pulse Rate: 75 (11/21 1648)  Labs: Recent Labs    05/26/19 0440 05/26/19 0649 05/26/19 1625  HGB 12.4  --   --   HCT 39.1  --   --   PLT 178  --   --   HEPARINUNFRC  --  0.26* 0.37    Estimated Creatinine Clearance: 37.1 mL/min (A) (by C-G formula based on SCr of 1.3 mg/dL (H)).   Assessment: 60 YOF s/p cath with plans for CABG. Pharmacy consulted to continue IV heparin.  Heparin level is therapeutic.  RN reports intermittent mild bleeding from IV site, last episode was late this morning.  Goal of Therapy:  Heparin level 0.3-0.7 units/ml Monitor platelets by anticoagulation protocol: Yes   Plan:  Continue heparin gtt at 950 units/hr F/U AM labs  Zander Ingham D. Mina Marble, PharmD, BCPS, [place] 05/26/2019, 4:53 PM

## 2019-05-26 NOTE — Progress Notes (Addendum)
Patient had 2 episodes of intermittent bleeding with heparin gtt that resolved.  Informed pharmacy and will continue to monitor.

## 2019-05-26 NOTE — Progress Notes (Signed)
  Echocardiogram 2D Echocardiogram has been performed.  Johny Chess 05/26/2019, 9:54 AM

## 2019-05-27 ENCOUNTER — Inpatient Hospital Stay (HOSPITAL_COMMUNITY): Payer: Medicare Other

## 2019-05-27 ENCOUNTER — Encounter (HOSPITAL_COMMUNITY): Payer: Self-pay | Admitting: Certified Registered Nurse Anesthetist

## 2019-05-27 DIAGNOSIS — Z01810 Encounter for preprocedural cardiovascular examination: Secondary | ICD-10-CM

## 2019-05-27 LAB — BASIC METABOLIC PANEL
Anion gap: 8 (ref 5–15)
BUN: 16 mg/dL (ref 8–23)
CO2: 25 mmol/L (ref 22–32)
Calcium: 8.9 mg/dL (ref 8.9–10.3)
Chloride: 109 mmol/L (ref 98–111)
Creatinine, Ser: 1.2 mg/dL — ABNORMAL HIGH (ref 0.44–1.00)
GFR calc Af Amer: 50 mL/min — ABNORMAL LOW (ref >60)
GFR calc non Af Amer: 44 mL/min — ABNORMAL LOW (ref >60)
Glucose, Bld: 111 mg/dL — ABNORMAL HIGH (ref 70–99)
Potassium: 3.7 mmol/L (ref 3.5–5.1)
Sodium: 142 mmol/L (ref 135–145)

## 2019-05-27 LAB — CBC
HCT: 39.4 % (ref 36.0–46.0)
Hemoglobin: 12.5 g/dL (ref 12.0–15.0)
MCH: 25.3 pg — ABNORMAL LOW (ref 26.0–34.0)
MCHC: 31.7 g/dL (ref 30.0–36.0)
MCV: 79.8 fL — ABNORMAL LOW (ref 80.0–100.0)
Platelets: 184 10*3/uL (ref 150–400)
RBC: 4.94 MIL/uL (ref 3.87–5.11)
RDW: 13.2 % (ref 11.5–15.5)
WBC: 6.9 10*3/uL (ref 4.0–10.5)
nRBC: 0 % (ref 0.0–0.2)

## 2019-05-27 LAB — URINALYSIS, ROUTINE W REFLEX MICROSCOPIC
Bilirubin Urine: NEGATIVE
Glucose, UA: NEGATIVE mg/dL
Hgb urine dipstick: NEGATIVE
Ketones, ur: NEGATIVE mg/dL
Leukocytes,Ua: NEGATIVE
Nitrite: NEGATIVE
Protein, ur: NEGATIVE mg/dL
Specific Gravity, Urine: 1.013 (ref 1.005–1.030)
pH: 6 (ref 5.0–8.0)

## 2019-05-27 LAB — BLOOD GAS, ARTERIAL
Acid-Base Excess: 0.1 mmol/L (ref 0.0–2.0)
Bicarbonate: 23.8 mmol/L (ref 20.0–28.0)
FIO2: 21
O2 Saturation: 97 %
Patient temperature: 36.9
pCO2 arterial: 35.8 mmHg (ref 32.0–48.0)
pH, Arterial: 7.438 (ref 7.350–7.450)
pO2, Arterial: 79.9 mmHg — ABNORMAL LOW (ref 83.0–108.0)

## 2019-05-27 LAB — LIPID PANEL
Cholesterol: 143 mg/dL (ref 0–200)
HDL: 47 mg/dL (ref >40)
LDL Cholesterol: 68 mg/dL (ref 0–99)
Total CHOL/HDL Ratio: 3 RATIO
Triglycerides: 141 mg/dL (ref <150)
VLDL: 28 mg/dL (ref 0–40)

## 2019-05-27 LAB — MRSA PCR SCREENING: MRSA by PCR: NEGATIVE

## 2019-05-27 LAB — HEPARIN LEVEL (UNFRACTIONATED): Heparin Unfractionated: 0.34 IU/mL (ref 0.30–0.70)

## 2019-05-27 LAB — PROTIME-INR
INR: 1 (ref 0.8–1.2)
Prothrombin Time: 13.5 seconds (ref 11.4–15.2)

## 2019-05-27 LAB — TYPE AND SCREEN
ABO/RH(D): B POS
Antibody Screen: NEGATIVE

## 2019-05-27 LAB — HEMOGLOBIN A1C
Hgb A1c MFr Bld: 5.5 % (ref 4.8–5.6)
Mean Plasma Glucose: 111.15 mg/dL

## 2019-05-27 LAB — APTT: aPTT: 76 seconds — ABNORMAL HIGH (ref 24–36)

## 2019-05-27 LAB — ABO/RH: ABO/RH(D): B POS

## 2019-05-27 MED ORDER — CHLORHEXIDINE GLUCONATE CLOTH 2 % EX PADS
6.0000 | MEDICATED_PAD | Freq: Once | CUTANEOUS | Status: AC
  Administered 2019-05-27: 6 via TOPICAL

## 2019-05-27 MED ORDER — CARVEDILOL 6.25 MG PO TABS
6.2500 mg | ORAL_TABLET | Freq: Two times a day (BID) | ORAL | Status: DC
  Administered 2019-05-27: 6.25 mg via ORAL
  Filled 2019-05-27: qty 1

## 2019-05-27 MED ORDER — TEMAZEPAM 15 MG PO CAPS
15.0000 mg | ORAL_CAPSULE | Freq: Once | ORAL | Status: AC | PRN
  Administered 2019-05-27: 23:00:00 15 mg via ORAL
  Filled 2019-05-27: qty 1

## 2019-05-27 MED ORDER — BISACODYL 5 MG PO TBEC
5.0000 mg | DELAYED_RELEASE_TABLET | Freq: Once | ORAL | Status: AC
  Administered 2019-05-27: 5 mg via ORAL
  Filled 2019-05-27: qty 1

## 2019-05-27 MED ORDER — CHLORHEXIDINE GLUCONATE CLOTH 2 % EX PADS
6.0000 | MEDICATED_PAD | Freq: Once | CUTANEOUS | Status: AC
  Administered 2019-05-28: 06:00:00 6 via TOPICAL

## 2019-05-27 MED ORDER — CHLORHEXIDINE GLUCONATE 0.12 % MT SOLN
15.0000 mL | Freq: Once | OROMUCOSAL | Status: AC
  Administered 2019-05-28: 15 mL via OROMUCOSAL
  Filled 2019-05-27: qty 15

## 2019-05-27 MED ORDER — LABETALOL HCL 5 MG/ML IV SOLN: 5.0000 mg | Freq: Four times a day (QID) | INTRAVENOUS | Status: DC | PRN

## 2019-05-27 NOTE — Progress Notes (Signed)
LE saphenous vein mapping and Pre CABG studies       have been completed. Preliminary results can be found under CV proc through chart review. June Leap, BS, RDMS, RVT

## 2019-05-27 NOTE — Plan of Care (Signed)
  Problem: Activity: Goal: Risk for activity intolerance will decrease Outcome: Progressing   Problem: Safety: Goal: Ability to remain free from injury will improve Outcome: Progressing   

## 2019-05-27 NOTE — Progress Notes (Signed)
Patient ID: Hannah Morrow, female   DOB: 20-Nov-1940, 78 y.o.   MRN: [id number]      [address]       [place] [address]             [phone]                 2 Days Post-Op Procedure(s) (LRB): LEFT HEART CATH AND CORONARY ANGIOGRAPHY (N/A)  LOS: 2 days   Subjective: No chest pain, patient did have elevated blood pressure last night  Objective: Vital signs in last 24 hours: Patient Vitals for the past 24 hrs:  BP Temp Temp src Pulse Resp SpO2 Weight  05/27/19 1344 (!) 131/59 -- -- 77 -- 97 % --  05/27/19 1150 (!) 141/65 -- -- 67 -- 97 % --  05/27/19 1052 118/63 -- -- 69 18 97 % --  05/27/19 0925 123/61 -- -- 74 -- 97 % --  05/27/19 0830 (!) 121/59 98.2 F (36.8 C) Oral 72 18 97 % --  05/27/19 0541 (!) 107/57 98.4 F (36.9 C) Oral 70 19 97 % 86.2 kg  05/27/19 0011 121/65 98.2 F (36.8 C) Oral 78 18 96 % --  05/26/19 2005 (!) 152/69 98.9 F (37.2 C) Oral 80 18 95 % --  05/26/19 1831 (!) 159/67 -- -- 84 18 99 % --  05/26/19 1822 (!) 154/66 -- -- 83 18 99 % --  05/26/19 1818 (!) 158/63 -- -- 84 18 99 % --  05/26/19 1811 (!) 164/65 -- -- 84 18 99 % --  05/26/19 1800 (!) 164/59 -- -- 81 18 99 % --  05/26/19 1755 (!) 174/68 -- -- 83 18 99 % --  05/26/19 1749 (!) 178/71 -- -- 80 18 99 % --  05/26/19 1648 (!) 181/83 98.4 F (36.9 C) Oral 75 18 99 % --    Filed Weights   05/25/19 1725 05/26/19 0505 05/27/19 0541  Weight: 86.7 kg 86.8 kg 86.2 kg    Hemodynamic parameters for last 24 hours:    Intake/Output from previous day: 11/21 0701 - 11/22 0700 In: 603.8 [P.O.:480; I.V.:123.8] Out: 1050 [Urine:700; Stool:350] Intake/Output this shift: Total I/O In: 360 [P.O.:360] Out: -   Scheduled Meds:  aspirin EC  81 mg Oral Daily   atorvastatin  80 mg Oral q1800   carvedilol  6.25 mg Oral BID WC   diphenhydrAMINE  25 mg Oral QHS   [START ON 05/28/2019] epinephrine  0-10 mcg/min Intravenous To OR   [START ON 05/28/2019]  heparin-papaverine-plasmalyte irrigation   Irrigation To OR   [START ON 05/28/2019] insulin   Intravenous To OR   levothyroxine  112 mcg Oral Q0600   lisinopril  40 mg Oral Daily   [START ON 05/28/2019] magnesium sulfate  40 mEq Other To OR   pantoprazole  40 mg Oral Daily   [START ON 05/28/2019] phenylephrine  30-200 mcg/min Intravenous To OR   [START ON 05/28/2019] potassium chloride  80 mEq Other To OR   sodium chloride flush  3 mL Intravenous Q12H   [START ON 05/28/2019] tranexamic acid  15 mg/kg Intravenous To OR   [START ON 05/28/2019] tranexamic acid  2 mg/kg Intracatheter To OR   Continuous Infusions:  sodium chloride     [START ON 05/28/2019] dexmedetomidine     [START ON 05/28/2019] DOPamine     [START ON 05/28/2019] heparin 30,000 units/NS 1000 mL solution for CELLSAVER     heparin 1,000 Units/hr (05/27/19 0924)   [  START ON 05/28/2019] levofloxacin (LEVAQUIN) IV     [START ON 05/28/2019] milrinone     nitroGLYCERIN Stopped (05/27/19 1339)   [START ON 05/28/2019] nitroGLYCERIN     [START ON 05/28/2019] tranexamic acid (CYKLOKAPRON) infusion ([facility])     [START ON 05/28/2019] vancomycin     PRN Meds:.sodium chloride, acetaminophen, labetalol, nitroGLYCERIN, ondansetron (ZOFRAN) IV, sodium chloride flush  General appearance: alert and cooperative Neurologic: intact Heart: regular rate and rhythm, S1, S2 normal, no murmur, click, rub or gallop Lungs: clear to auscultation bilaterally Abdomen: soft, non-tender; bowel sounds normal; no masses,  no organomegaly Extremities: extremities normal, atraumatic, no cyanosis or edema and Homans sign is negative, no sign of DVT  Lab Results: CBC: Recent Labs    05/26/19 0440 05/27/19 0411  WBC 6.9 6.9  HGB 12.4 12.5  HCT 39.1 39.4  PLT 178 184   BMET:  Recent Labs    05/27/19 0411  NA 142  K 3.7  CL 109  CO2 25  GLUCOSE 111*  BUN 16  CREATININE 1.20*  CALCIUM 8.9    PT/INR: No results for  input(s): LABPROT, INR in the last 72 hours.   Radiology Dg Chest 2 View  Result Date: 05/26/2019 CLINICAL DATA:  Preop for heart surgery. EXAM: CHEST - 2 VIEW COMPARISON:  None. FINDINGS: Lateral view degraded by patient arm position. Numerous leads and wires project over the chest. Midline trachea. Normal heart size and mediastinal contours. No pleural effusion or pneumothorax. Clear lungs. No congestive failure. IMPRESSION: No acute cardiopulmonary disease. Electronically Signed   By: Abigail Miyamoto M.D.   On: 05/26/2019 10:57   Vas [place] Lower Extremity Saphenous Vein Mapping  Result Date: 05/27/2019 LOWER EXTREMITY VEIN MAPPING Indications: Pre-op History:     Varicose veins.  Performing Technologist: June Leap RDMS, RVT  Examination Guidelines: A complete evaluation includes B-mode imaging, spectral Doppler, color Doppler, and power Doppler as needed of all accessible portions of each vessel. Bilateral testing is considered an integral part of a complete examination. Limited examinations for reoccurring indications may be performed as noted. +--------------+---------------+--------------------+--------------+-----------+   RT Diameter     RT Findings           GSV           LT Diameter   LT Findings        (cm)                                                (cm)                   +--------------+---------------+--------------------+--------------+-----------+       0.59                         Saphenofemoral         0.55                                                         Junction                                   +--------------+---------------+--------------------+--------------+-----------+  0.55                         Proximal thigh         0.61                   +--------------+---------------+--------------------+--------------+-----------+       0.56                           Mid thigh            0.56                    +--------------+---------------+--------------------+--------------+-----------+       0.72         branching        Distal thigh          0.49                   +--------------+---------------+--------------------+--------------+-----------+       0.41         branching            Knee              0.52       branching   +--------------+---------------+--------------------+--------------+-----------+       0.59                           Prox calf            0.33       branching   +--------------+---------------+--------------------+--------------+-----------+       0.21         multiple           Mid calf            0.32       branching                      branches                                                      +--------------+---------------+--------------------+--------------+-----------+                                     Distal calf           0.33       branching   +--------------+---------------+--------------------+--------------+-----------+ Diagnosing physician: Servando Snare MD Electronically signed by Servando Snare MD on 05/27/2019 at 10:57:30 AM.    Final    Vas US Doppler Pre Cabg  Result Date: 05/27/2019 PREOPERATIVE VASCULAR EVALUATION  Indications:      Pre-CABG. Risk Factors:     Hypertension, hyperlipidemia. Comparison Study: no prior Performing Technologist: June Leap RDMS, RVT  Examination Guidelines: A complete evaluation includes B-mode imaging, spectral Doppler, color Doppler, and power Doppler as needed of all accessible portions of each vessel. Bilateral testing is considered an integral part of a complete examination. Limited examinations for reoccurring indications may be performed as noted.  Right Carotid Findings: +----------+--------+--------+--------+--------+---------+             PSV cm/s EDV cm/s Stenosis Describe Comments   +----------+--------+--------+--------+--------+---------+  CCA Prox   119      14                                     +----------+--------+--------+--------+--------+---------+  CCA Distal 69       14                                    +----------+--------+--------+--------+--------+---------+  ICA Prox   78       20       1-39%    calcific Shadowing  +----------+--------+--------+--------+--------+---------+  ICA Distal 72       17                                    +----------+--------+--------+--------+--------+---------+  ECA        135      1                                     +----------+--------+--------+--------+--------+---------+ Portions of this table do not appear on this page. +----------+--------+-------+----------------+------------+             PSV cm/s EDV cms Describe         Arm Pressure  +----------+--------+-------+----------------+------------+  Subclavian 244              Multiphasic, WNL 113           +----------+--------+-------+----------------+------------+ +---------+--------+--+--------+--+---------+  Vertebral PSV cm/s 79 EDV cm/s 20 Antegrade  +---------+--------+--+--------+--+---------+ Left Carotid Findings: +----------+--------+--------+--------+-----------+----------------------------+             PSV cm/s EDV cm/s Stenosis Describe    Comments                      +----------+--------+--------+--------+-----------+----------------------------+  CCA Prox   148      22                                                          +----------+--------+--------+--------+-----------+----------------------------+  CCA Distal 98       24                                                          +----------+--------+--------+--------+-----------+----------------------------+  ICA Prox   147      39       40-59%   homogeneous velocities may be elevated                                                       due to tortuosity             +----------+--------+--------+--------+-----------+----------------------------+  ICA Distal 82       22                                                           +----------+--------+--------+--------+-----------+----------------------------+  ECA        116      11                                                          +----------+--------+--------+--------+-----------+----------------------------+ +----------+--------+--------+----------------+------------+  Subclavian PSV cm/s EDV cm/s Describe         Arm Pressure  +----------+--------+--------+----------------+------------+             217               Multiphasic, WNL 119           +----------+--------+--------+----------------+------------+ +---------+--------+--+--------+-+---------+  Vertebral PSV cm/s 42 EDV cm/s 9 Antegrade  +---------+--------+--+--------+-+---------+   Right Doppler Findings: +--------+--------+-----+---------+--------+  Site     Pressure Index Doppler   Comments  +--------+--------+-----+---------+--------+  Brachial 113            triphasic           +--------+--------+-----+---------+--------+  Radial                  triphasic           +--------+--------+-----+---------+--------+  Ulnar                   triphasic           +--------+--------+-----+---------+--------+  Left Doppler Findings: +--------+--------+-----+---------+--------+  Site     Pressure Index Doppler   Comments  +--------+--------+-----+---------+--------+  Brachial 119            triphasic           +--------+--------+-----+---------+--------+  Radial                  triphasic           +--------+--------+-----+---------+--------+  Ulnar                   triphasic           +--------+--------+-----+---------+--------+  Summary: Right Carotid: Velocities in the right ICA are consistent with a 1-39% stenosis. Left Carotid: Velocities in the left ICA are consistent with a 40-59% stenosis. ABI: Pedal artery waveforms within normal limits. Right Upper Extremity: Doppler waveforms decrease >50% with right radial compression. Doppler waveforms decrease >50% with right ulnar compression. Left Upper Extremity: Doppler  waveform obliterate with left radial compression. Doppler waveforms remain within normal limits with left ulnar compression.  Electronically signed by Servando Snare MD on 05/27/2019 at 10:57:39 AM.    Final      Assessment/Plan: S/P Procedure(s) (LRB): LEFT HEART CATH AND CORONARY ANGIOGRAPHY (N/A)  Doppler studies of upper extremities-so radial arteries not adequate for use of grafts   The goals risks and alternatives of the planned surgical procedure Procedure(s): LEFT HEART CATH AND CORONARY ANGIOGRAPHY (N/A)  have been discussed with the patient and her daughter  in detail. The risks of the procedure including death, infection, stroke, myocardial infarction, bleeding, blood transfusion have all been discussed specifically.  I have quoted [name] a 2 % of perioperative mortality and a complication rate as high as 30 %. The patient's questions have been answered.Vittoria Wait is willing  to proceed with the planned procedure.   Grace Isaac MD 05/27/2019 1:51 PM

## 2019-05-27 NOTE — Progress Notes (Signed)
ANTICOAGULATION CONSULT NOTE  Pharmacy Consult:  Heparin Indication: chest pain/ACS  Patient Measurements: Height: 5\' 2"  (157.5 cm) Weight: 190 lb (86.2 kg) IBW/kg (Calculated) : 50.1 Heparin Dosing Weight: 82kg  Vital Signs: Temp: 98.2 F (36.8 C) (11/22 0830) Temp Source: Oral (11/22 0830) BP: 121/59 (11/22 0830) Pulse Rate: 72 (11/22 0830)  Labs: Recent Labs    05/26/19 0440 05/26/19 0649 05/26/19 1625 05/27/19 0411  HGB 12.4  --   --  12.5  HCT 39.1  --   --  39.4  PLT 178  --   --  184  HEPARINUNFRC  --  0.26* 0.37 0.34  CREATININE  --   --   --  1.20*    Estimated Creatinine Clearance: 40 mL/min (A) (by C-G formula based on SCr of 1.2 mg/dL (H)).   Assessment: 58 YOF s/p cath with plans for CABG. Pharmacy consulted to continue IV heparin.  Heparin level is therapeutic.  RN reports intermittent mild bleeding from IV site.  HL at low end of goal with slight down trend. Will increase rate slightly to maintain in goal.   Goal of Therapy:  Heparin level 0.3-0.7 units/ml Monitor platelets by anticoagulation protocol: Yes   Plan:  Increase heparin gtt 1000 units/hr Monitor daily HL, CBC, plt Monitor for signs/symptoms of bleeding     Benetta Spar, PharmD, BCPS, BCCP Clinical Pharmacist  Please check AMION for all [facility] phone numbers After 10:00 PM, call [place]

## 2019-05-27 NOTE — Progress Notes (Signed)
Pt scheduled for CABG on 05/28/19: Per Dr. Servando Snare, MD 'Make sure patient gets preop dose of Coreg in early am even though is npo and will not have "with meals".

## 2019-05-27 NOTE — Plan of Care (Signed)
  Problem: Education: Goal: Knowledge of General Education information will improve Description: Including pain rating scale, medication(s)/side effects and non-pharmacologic comfort measures Outcome: Progressing   Problem: Health Behavior/Discharge Planning: Goal: Ability to manage health-related needs will improve Outcome: Progressing   Problem: Clinical Measurements: Goal: Ability to maintain clinical measurements within normal limits will improve Outcome: Progressing   Problem: Coping: Goal: Level of anxiety will decrease Outcome: Progressing   Problem: Pain Managment: Goal: General experience of comfort will improve Outcome: Progressing   Problem: Cardiovascular: Goal: Ability to achieve and maintain adequate cardiovascular perfusion will improve Outcome: Progressing

## 2019-05-27 NOTE — Anesthesia Preprocedure Evaluation (Addendum)
Anesthesia Evaluation  Patient identified by MRN, date of birth, ID band Patient awake    Reviewed: Allergy & Precautions, NPO status , Patient's Chart, lab work & pertinent test results  History of Anesthesia Complications Negative for: history of anesthetic complications  Airway Mallampati: II  TM Distance: >3 FB Neck ROM: Full    Dental  (+) Dental Advisory Given, Teeth Intact   Pulmonary neg pulmonary ROS,    Pulmonary exam normal        Cardiovascular hypertension, Pt. on medications and Pt. on home beta blockers + angina + CAD  Normal cardiovascular exam   '20 Carotid US - 40-59% left ICAS, 1-39% right ICAS  '20 TTE - EF 50 to 55%. No left ventricular hypertrophy. Apical septal segment and apex are mildly hypokinetic. Grade I diastolic dysfunction (impaired relaxation). No valvulopathy.  '20 Cath - Ost LAD lesion is 95% stenosed. Prox LAD lesion is 90% stenosed ->Prox LAD to Mid LAD lesion is 40% stenosed with 40% stenosed side branch in 1st Diag. Ramus lesion is 70% stenosed.   Neuro/Psych PSYCHIATRIC DISORDERS Anxiety  RLS     GI/Hepatic Neg liver ROS, GERD  Medicated and Controlled,  Endo/Other  Hypothyroidism  Obesity   Renal/GU Renal InsufficiencyRenal disease     Musculoskeletal negative musculoskeletal ROS (+)   Abdominal   Peds  Hematology negative hematology ROS (+)   Anesthesia Other Findings Covid negative 11/19  Reproductive/Obstetrics                            Anesthesia Physical Anesthesia Plan  ASA: IV  Anesthesia Plan: General   Post-op Pain Management:    Induction: Intravenous  PONV Risk Score and Plan: 3 and Treatment may vary due to age or medical condition  Airway Management Planned: Oral ETT  Additional Equipment: Arterial line, CVP, PA Cath, TEE and Ultrasound Guidance Line Placement  Intra-op Plan:   Post-operative Plan:  Post-operative intubation/ventilation  Informed Consent: I have reviewed the patients History and Physical, chart, labs and discussed the procedure including the risks, benefits and alternatives for the proposed anesthesia with the patient or authorized representative who has indicated his/her understanding and acceptance.     Dental advisory given  Plan Discussed with: CRNA and Anesthesiologist  Anesthesia Plan Comments:        Anesthesia Quick Evaluation

## 2019-05-27 NOTE — Progress Notes (Signed)
Progress Note  Patient Name: Hannah Morrow Date of Encounter: 05/27/2019  Primary Cardiologist: Dr Gwenlyn Found  Subjective   No chest pain or SOB. Some headache throughout the night  Inpatient Medications    Scheduled Meds: . aspirin EC  81 mg Oral Daily  . atorvastatin  80 mg Oral q1800  . carvedilol  6.25 mg Oral BID WC  . diphenhydrAMINE  25 mg Oral QHS  . [START ON 05/28/2019] epinephrine  0-10 mcg/min Intravenous To OR  . [START ON 05/28/2019] heparin-papaverine-plasmalyte irrigation   Irrigation To OR  . [START ON 05/28/2019] insulin   Intravenous To OR  . levothyroxine  112 mcg Oral Q0600  . lisinopril  40 mg Oral Daily  . [START ON 05/28/2019] magnesium sulfate  40 mEq Other To OR  . pantoprazole  40 mg Oral Daily  . [START ON 05/28/2019] phenylephrine  30-200 mcg/min Intravenous To OR  . [START ON 05/28/2019] potassium chloride  80 mEq Other To OR  . sodium chloride flush  3 mL Intravenous Q12H  . [START ON 05/28/2019] tranexamic acid  15 mg/kg Intravenous To OR  . [START ON 05/28/2019] tranexamic acid  2 mg/kg Intracatheter To OR   Continuous Infusions: . sodium chloride    . [START ON 05/28/2019] dexmedetomidine    . [START ON 05/28/2019] DOPamine    . [START ON 05/28/2019] heparin 30,000 units/NS 1000 mL solution for CELLSAVER    . heparin 950 Units/hr (05/27/19 0134)  . [START ON 05/28/2019] levofloxacin (LEVAQUIN) IV    . [START ON 05/28/2019] milrinone    . nitroGLYCERIN 30 mcg/min (05/26/19 1814)  . [START ON 05/28/2019] nitroGLYCERIN    . [START ON 05/28/2019] tranexamic acid (CYKLOKAPRON) infusion ([facility])    . [START ON 05/28/2019] vancomycin     PRN Meds: sodium chloride, acetaminophen, nitroGLYCERIN, ondansetron (ZOFRAN) IV, sodium chloride flush   Vital Signs    Vitals:   05/26/19 1831 05/26/19 2005 05/27/19 0011 05/27/19 0541  BP: (!) 159/67 (!) 152/69 121/65 (!) 107/57  Pulse: 84 80 78 70  Resp: 18 18 18 19   Temp:  98.9 F (37.2 C)  98.2 F (36.8 C) 98.4 F (36.9 C)  TempSrc:  Oral Oral Oral  SpO2: 99% 95% 96% 97%  Weight:    86.2 kg  Height:        Intake/Output Summary (Last 24 hours) at 05/27/2019 0758 Last data filed at 05/27/2019 0556 Gross per 24 hour  Intake 603.79 ml  Output 1050 ml  Net -446.21 ml   Last 3 Weights 05/27/2019 05/26/2019 05/25/2019  Weight (lbs) 190 lb 191 lb 4.8 oz 191 lb 3.2 oz  Weight (kg) 86.183 kg 86.773 kg 86.728 kg      Telemetry    SR - Personally Reviewed  ECG    n/a - Personally Reviewed  Physical Exam   GEN: No acute distress.   Neck: No JVD Cardiac: RRR, no murmurs, rubs, or gallops.  Respiratory: Clear to auscultation bilaterally. GI: Soft, nontender, non-distended  MS: No edema; No deformity. Neuro:  Nonfocal  Psych: Normal affect   Labs    High Sensitivity Troponin:  No results for input(s): TROPONINIHS in the last 720 hours.    Chemistry Recent Labs  Lab 05/23/19 1403 05/27/19 0411  NA 139 142  K 4.8 3.7  CL 100 109  CO2 20 25  GLUCOSE 87 111*  BUN 28* 16  CREATININE 1.30* 1.20*  CALCIUM 9.7 8.9  GFRNONAA 40* 44*  GFRAA 46*  [place]  --  8     Hematology Recent Labs  Lab 05/23/19 1403 05/26/19 0440 05/27/19 0411  WBC 7.6 6.9 6.9  RBC 5.62* 4.90 4.94  HGB 14.0 12.4 12.5  HCT 44.7 39.1 39.4  MCV 80 79.8* 79.8*  MCH 24.9* 25.3* 25.3*  MCHC 31.3* 31.7 31.7  RDW 12.8 13.1 13.2  PLT 234 178 184    BNPNo results for input(s): BNP, PROBNP in the last 168 hours.   DDimer No results for input(s): DDIMER in the last 168 hours.   Radiology    Dg Chest 2 View  Result Date: 05/26/2019 CLINICAL DATA:  Preop for heart surgery. EXAM: CHEST - 2 VIEW COMPARISON:  None. FINDINGS: Lateral view degraded by patient arm position. Numerous leads and wires project over the chest. Midline trachea. Normal heart size and mediastinal contours. No pleural effusion or pneumothorax. Clear lungs. No congestive failure. IMPRESSION: No acute  cardiopulmonary disease. Electronically Signed   By: Abigail Miyamoto M.D.   On: 05/26/2019 10:57    Cardiac Studies    Patient Profile     Hannah Skerritt Sinclairis a 78 y.o.femalewith a hx of hypertension and hyperlipidemia who was referred to Dr. Gwenlyn Found May 09, 2019 for evaluation of chest pain and referred for outpatient cath.   Assessment & Plan    1. CAD/Chest pain - referred for outpatient cath due to chest pain - cath showed ostail LAD 95%, prox LAD 90%, ramus 70%, LCX patent, RCA patent - 05/2019 echo LVEF 50-55%, apical hypokinesis, grade I diastolic dysfunction - being worked up for possible CABG by CT surgery, looks like surgery likely Monday - medical therapy with ASA, coreg 6.25mg  bid, hep gtt, lisinopril 40, nitrgo gtt, atorva 80  - continue workup for CABG Monday per CT surgery - some headache on NG gtt, asked nursing to lower dosing and adjust for chest pain only not bp, start prn labetalol for elevated bp's.      For questions or updates, please contact [facility] Please consult [email or web address] for contact info under        Signed, Carlyle Dolly, MD  05/27/2019, 7:58 AM

## 2019-05-28 ENCOUNTER — Inpatient Hospital Stay (HOSPITAL_COMMUNITY): Payer: Medicare Other | Admitting: Certified Registered Nurse Anesthetist

## 2019-05-28 ENCOUNTER — Encounter (HOSPITAL_COMMUNITY)
Admission: RE | Disposition: A | Discharge status: Home / Self Care | Payer: Self-pay | Source: Home / Self Care | Attending: Cardiothoracic Surgery

## 2019-05-28 ENCOUNTER — Inpatient Hospital Stay (HOSPITAL_COMMUNITY): Payer: Medicare Other

## 2019-05-28 ENCOUNTER — Encounter (HOSPITAL_COMMUNITY): Payer: Self-pay | Admitting: Cardiology

## 2019-05-28 DIAGNOSIS — I25110 Atherosclerotic heart disease of native coronary artery with unstable angina pectoris: Secondary | ICD-10-CM

## 2019-05-28 HISTORY — PX: TEE WITHOUT CARDIOVERSION: SHX5443

## 2019-05-28 HISTORY — PX: CORONARY ARTERY BYPASS GRAFT: SHX141

## 2019-05-28 LAB — CBC
HCT: 41 % (ref 36.0–46.0)
HCT: 35.1 % — ABNORMAL LOW (ref 36.0–46.0)
HCT: 36.1 % (ref 36.0–46.0)
Hemoglobin: 12.7 g/dL (ref 12.0–15.0)
Hemoglobin: 10.9 g/dL — ABNORMAL LOW (ref 12.0–15.0)
Hemoglobin: 11.3 g/dL — ABNORMAL LOW (ref 12.0–15.0)
MCH: 25 pg — ABNORMAL LOW (ref 26.0–34.0)
MCH: 25.3 pg — ABNORMAL LOW (ref 26.0–34.0)
MCH: 25.6 pg — ABNORMAL LOW (ref 26.0–34.0)
MCHC: 31 g/dL (ref 30.0–36.0)
MCHC: 31.1 g/dL (ref 30.0–36.0)
MCHC: 31.3 g/dL (ref 30.0–36.0)
MCV: 80.7 fL (ref 80.0–100.0)
MCV: 81.4 fL (ref 80.0–100.0)
MCV: 81.9 fL (ref 80.0–100.0)
Platelets: 189 10*3/uL (ref 150–400)
Platelets: 103 10*3/uL — ABNORMAL LOW (ref 150–400)
Platelets: 127 10*3/uL — ABNORMAL LOW (ref 150–400)
RBC: 5.08 MIL/uL (ref 3.87–5.11)
RBC: 4.31 MIL/uL (ref 3.87–5.11)
RBC: 4.41 MIL/uL (ref 3.87–5.11)
RDW: 13.2 % (ref 11.5–15.5)
RDW: 13.2 % (ref 11.5–15.5)
RDW: 13.2 % (ref 11.5–15.5)
WBC: 7.1 10*3/uL (ref 4.0–10.5)
WBC: 10.9 10*3/uL — ABNORMAL HIGH (ref 4.0–10.5)
WBC: 10.9 10*3/uL — ABNORMAL HIGH (ref 4.0–10.5)
nRBC: 0 % (ref 0.0–0.2)
nRBC: 0 % (ref 0.0–0.2)
nRBC: 0 % (ref 0.0–0.2)

## 2019-05-28 LAB — MAGNESIUM: Magnesium: 3 mg/dL — ABNORMAL HIGH (ref 1.7–2.4)

## 2019-05-28 LAB — POCT I-STAT 7, (LYTES, BLD GAS, ICA,H+H)
Acid-Base Excess: 2 mmol/L (ref 0.0–2.0)
Acid-base deficit: 3 mmol/L — ABNORMAL HIGH (ref 0.0–2.0)
Acid-base deficit: 4 mmol/L — ABNORMAL HIGH (ref 0.0–2.0)
Acid-base deficit: 5 mmol/L — ABNORMAL HIGH (ref 0.0–2.0)
Acid-base deficit: 4 mmol/L — ABNORMAL HIGH (ref 0.0–2.0)
Bicarbonate: 25.7 mmol/L (ref 20.0–28.0)
Bicarbonate: 21.6 mmol/L (ref 20.0–28.0)
Bicarbonate: 22.5 mmol/L (ref 20.0–28.0)
Bicarbonate: 20.1 mmol/L (ref 20.0–28.0)
Bicarbonate: 21.9 mmol/L (ref 20.0–28.0)
Calcium, Ion: 0.97 mmol/L — ABNORMAL LOW (ref 1.15–1.40)
Calcium, Ion: 1.06 mmol/L — ABNORMAL LOW (ref 1.15–1.40)
Calcium, Ion: 1.09 mmol/L — ABNORMAL LOW (ref 1.15–1.40)
Calcium, Ion: 1.09 mmol/L — ABNORMAL LOW (ref 1.15–1.40)
Calcium, Ion: 1.1 mmol/L — ABNORMAL LOW (ref 1.15–1.40)
HCT: 26 % — ABNORMAL LOW (ref 36.0–46.0)
HCT: 28 % — ABNORMAL LOW (ref 36.0–46.0)
HCT: 34 % — ABNORMAL LOW (ref 36.0–46.0)
HCT: 35 % — ABNORMAL LOW (ref 36.0–46.0)
HCT: 33 % — ABNORMAL LOW (ref 36.0–46.0)
Hemoglobin: 8.8 g/dL — ABNORMAL LOW (ref 12.0–15.0)
Hemoglobin: 9.5 g/dL — ABNORMAL LOW (ref 12.0–15.0)
Hemoglobin: 11.6 g/dL — ABNORMAL LOW (ref 12.0–15.0)
Hemoglobin: 11.9 g/dL — ABNORMAL LOW (ref 12.0–15.0)
Hemoglobin: 11.2 g/dL — ABNORMAL LOW (ref 12.0–15.0)
O2 Saturation: 100 %
O2 Saturation: 99 %
O2 Saturation: 97 %
O2 Saturation: 98 %
O2 Saturation: 99 %
Patient temperature: 36.7
Patient temperature: 37
Patient temperature: 37.4
Potassium: 4.9 mmol/L (ref 3.5–5.1)
Potassium: 3.8 mmol/L (ref 3.5–5.1)
Potassium: 3.8 mmol/L (ref 3.5–5.1)
Potassium: 4.6 mmol/L (ref 3.5–5.1)
Potassium: 4.6 mmol/L (ref 3.5–5.1)
Sodium: 136 mmol/L (ref 135–145)
Sodium: 141 mmol/L (ref 135–145)
Sodium: 143 mmol/L (ref 135–145)
Sodium: 141 mmol/L (ref 135–145)
Sodium: 141 mmol/L (ref 135–145)
TCO2: 27 mmol/L (ref 22–32)
TCO2: 23 mmol/L (ref 22–32)
TCO2: 24 mmol/L (ref 22–32)
TCO2: 21 mmol/L — ABNORMAL LOW (ref 22–32)
TCO2: 23 mmol/L (ref 22–32)
pCO2 arterial: 35.4 mmHg (ref 32.0–48.0)
pCO2 arterial: 33.8 mmHg (ref 32.0–48.0)
pCO2 arterial: 47.1 mmHg (ref 32.0–48.0)
pCO2 arterial: 38.3 mmHg (ref 32.0–48.0)
pCO2 arterial: 42 mmHg (ref 32.0–48.0)
pH, Arterial: 7.47 — ABNORMAL HIGH (ref 7.350–7.450)
pH, Arterial: 7.413 (ref 7.350–7.450)
pH, Arterial: 7.286 — ABNORMAL LOW (ref 7.350–7.450)
pH, Arterial: 7.328 — ABNORMAL LOW (ref 7.350–7.450)
pH, Arterial: 7.328 — ABNORMAL LOW (ref 7.350–7.450)
pO2, Arterial: 433 mmHg — ABNORMAL HIGH (ref 83.0–108.0)
pO2, Arterial: 138 mmHg — ABNORMAL HIGH (ref 83.0–108.0)
pO2, Arterial: 100 mmHg (ref 83.0–108.0)
pO2, Arterial: 111 mmHg — ABNORMAL HIGH (ref 83.0–108.0)
pO2, Arterial: 133 mmHg — ABNORMAL HIGH (ref 83.0–108.0)

## 2019-05-28 LAB — POCT I-STAT, CHEM 8
BUN: 15 mg/dL (ref 8–23)
BUN: 14 mg/dL (ref 8–23)
BUN: 12 mg/dL (ref 8–23)
BUN: 13 mg/dL (ref 8–23)
BUN: 13 mg/dL (ref 8–23)
BUN: 13 mg/dL (ref 8–23)
Calcium, Ion: 1.24 mmol/L (ref 1.15–1.40)
Calcium, Ion: 1.2 mmol/L (ref 1.15–1.40)
Calcium, Ion: 0.91 mmol/L — ABNORMAL LOW (ref 1.15–1.40)
Calcium, Ion: 0.99 mmol/L — ABNORMAL LOW (ref 1.15–1.40)
Calcium, Ion: 0.99 mmol/L — ABNORMAL LOW (ref 1.15–1.40)
Calcium, Ion: 1.06 mmol/L — ABNORMAL LOW (ref 1.15–1.40)
Chloride: 106 mmol/L (ref 98–111)
Chloride: 107 mmol/L (ref 98–111)
Chloride: 102 mmol/L (ref 98–111)
Chloride: 102 mmol/L (ref 98–111)
Chloride: 103 mmol/L (ref 98–111)
Chloride: 102 mmol/L (ref 98–111)
Creatinine, Ser: 1 mg/dL (ref 0.44–1.00)
Creatinine, Ser: 1 mg/dL (ref 0.44–1.00)
Creatinine, Ser: 0.8 mg/dL (ref 0.44–1.00)
Creatinine, Ser: 0.8 mg/dL (ref 0.44–1.00)
Creatinine, Ser: 0.9 mg/dL (ref 0.44–1.00)
Creatinine, Ser: 0.9 mg/dL (ref 0.44–1.00)
Glucose, Bld: 110 mg/dL — ABNORMAL HIGH (ref 70–99)
Glucose, Bld: 129 mg/dL — ABNORMAL HIGH (ref 70–99)
Glucose, Bld: 110 mg/dL — ABNORMAL HIGH (ref 70–99)
Glucose, Bld: 171 mg/dL — ABNORMAL HIGH (ref 70–99)
Glucose, Bld: 175 mg/dL — ABNORMAL HIGH (ref 70–99)
Glucose, Bld: 168 mg/dL — ABNORMAL HIGH (ref 70–99)
HCT: 37 % (ref 36.0–46.0)
HCT: 35 % — ABNORMAL LOW (ref 36.0–46.0)
HCT: 27 % — ABNORMAL LOW (ref 36.0–46.0)
HCT: 27 % — ABNORMAL LOW (ref 36.0–46.0)
HCT: 27 % — ABNORMAL LOW (ref 36.0–46.0)
HCT: 27 % — ABNORMAL LOW (ref 36.0–46.0)
Hemoglobin: 12.6 g/dL (ref 12.0–15.0)
Hemoglobin: 11.9 g/dL — ABNORMAL LOW (ref 12.0–15.0)
Hemoglobin: 9.2 g/dL — ABNORMAL LOW (ref 12.0–15.0)
Hemoglobin: 9.2 g/dL — ABNORMAL LOW (ref 12.0–15.0)
Hemoglobin: 9.2 g/dL — ABNORMAL LOW (ref 12.0–15.0)
Hemoglobin: 9.2 g/dL — ABNORMAL LOW (ref 12.0–15.0)
Potassium: 3.5 mmol/L (ref 3.5–5.1)
Potassium: 3.6 mmol/L (ref 3.5–5.1)
Potassium: 3.7 mmol/L (ref 3.5–5.1)
Potassium: 4.5 mmol/L (ref 3.5–5.1)
Potassium: 4.6 mmol/L (ref 3.5–5.1)
Potassium: 3.7 mmol/L (ref 3.5–5.1)
Sodium: 143 mmol/L (ref 135–145)
Sodium: 141 mmol/L (ref 135–145)
Sodium: 142 mmol/L (ref 135–145)
Sodium: 138 mmol/L (ref 135–145)
Sodium: 140 mmol/L (ref 135–145)
Sodium: 140 mmol/L (ref 135–145)
TCO2: 26 mmol/L (ref 22–32)
TCO2: 23 mmol/L (ref 22–32)
TCO2: 25 mmol/L (ref 22–32)
TCO2: 24 mmol/L (ref 22–32)
TCO2: 24 mmol/L (ref 22–32)
TCO2: 21 mmol/L — ABNORMAL LOW (ref 22–32)

## 2019-05-28 LAB — BASIC METABOLIC PANEL
Anion gap: 7 (ref 5–15)
Anion gap: 5 (ref 5–15)
BUN: 15 mg/dL (ref 8–23)
BUN: 14 mg/dL (ref 8–23)
CO2: 25 mmol/L (ref 22–32)
CO2: 22 mmol/L (ref 22–32)
Calcium: 8.7 mg/dL — ABNORMAL LOW (ref 8.9–10.3)
Calcium: 7.6 mg/dL — ABNORMAL LOW (ref 8.9–10.3)
Chloride: 108 mmol/L (ref 98–111)
Chloride: 110 mmol/L (ref 98–111)
Creatinine, Ser: 1.32 mg/dL — ABNORMAL HIGH (ref 0.44–1.00)
Creatinine, Ser: 1.11 mg/dL — ABNORMAL HIGH (ref 0.44–1.00)
GFR calc Af Amer: 45 mL/min — ABNORMAL LOW (ref >60)
GFR calc Af Amer: 55 mL/min — ABNORMAL LOW (ref >60)
GFR calc non Af Amer: 39 mL/min — ABNORMAL LOW (ref >60)
GFR calc non Af Amer: 48 mL/min — ABNORMAL LOW (ref >60)
Glucose, Bld: 107 mg/dL — ABNORMAL HIGH (ref 70–99)
Glucose, Bld: 148 mg/dL — ABNORMAL HIGH (ref 70–99)
Potassium: 3.5 mmol/L (ref 3.5–5.1)
Potassium: 4.6 mmol/L (ref 3.5–5.1)
Sodium: 140 mmol/L (ref 135–145)
Sodium: 137 mmol/L (ref 135–145)

## 2019-05-28 LAB — GLUCOSE, CAPILLARY
Glucose-Capillary: 140 mg/dL — ABNORMAL HIGH (ref 70–99)
Glucose-Capillary: 130 mg/dL — ABNORMAL HIGH (ref 70–99)
Glucose-Capillary: 148 mg/dL — ABNORMAL HIGH (ref 70–99)
Glucose-Capillary: 143 mg/dL — ABNORMAL HIGH (ref 70–99)
Glucose-Capillary: 133 mg/dL — ABNORMAL HIGH (ref 70–99)
Glucose-Capillary: 143 mg/dL — ABNORMAL HIGH (ref 70–99)
Glucose-Capillary: 146 mg/dL — ABNORMAL HIGH (ref 70–99)
Glucose-Capillary: 155 mg/dL — ABNORMAL HIGH (ref 70–99)
Glucose-Capillary: 128 mg/dL — ABNORMAL HIGH (ref 70–99)
Glucose-Capillary: 129 mg/dL — ABNORMAL HIGH (ref 70–99)
Glucose-Capillary: 165 mg/dL — ABNORMAL HIGH (ref 70–99)

## 2019-05-28 LAB — APTT: aPTT: 32 seconds (ref 24–36)

## 2019-05-28 LAB — ECHO INTRAOPERATIVE TEE
Height: 62 in
Weight: 3044.8 oz

## 2019-05-28 LAB — PROTIME-INR
INR: 1.4 — ABNORMAL HIGH (ref 0.8–1.2)
Prothrombin Time: 16.6 seconds — ABNORMAL HIGH (ref 11.4–15.2)

## 2019-05-28 LAB — HEPARIN LEVEL (UNFRACTIONATED): Heparin Unfractionated: 0.24 IU/mL — ABNORMAL LOW (ref 0.30–0.70)

## 2019-05-28 LAB — SURGICAL PCR SCREEN
MRSA, PCR: NEGATIVE
Staphylococcus aureus: NEGATIVE

## 2019-05-28 LAB — HEMOGLOBIN AND HEMATOCRIT, BLOOD
HCT: 28.6 % — ABNORMAL LOW (ref 36.0–46.0)
Hemoglobin: 9 g/dL — ABNORMAL LOW (ref 12.0–15.0)

## 2019-05-28 LAB — PLATELET COUNT: Platelets: 141 10*3/uL — ABNORMAL LOW (ref 150–400)

## 2019-05-28 SURGERY — CORONARY ARTERY BYPASS GRAFTING (CABG)
Anesthesia: General | Site: Chest

## 2019-05-28 MED ORDER — ALBUMIN HUMAN 5 % IV SOLN
250.0000 mL | INTRAVENOUS | Status: AC | PRN
  Administered 2019-05-28 (×2): 12.5 g via INTRAVENOUS

## 2019-05-28 MED ORDER — ALBUMIN HUMAN 5 % IV SOLN
INTRAVENOUS | Status: DC | PRN
  Administered 2019-05-28: 12:00:00 via INTRAVENOUS

## 2019-05-28 MED ORDER — MIDAZOLAM HCL 2 MG/2ML IJ SOLN: 2.0000 mg | INTRAMUSCULAR | Status: DC | PRN

## 2019-05-28 MED ORDER — SODIUM CHLORIDE 0.9 % IV SOLN: INTRAVENOUS | Status: DC

## 2019-05-28 MED ORDER — CHLORHEXIDINE GLUCONATE 0.12 % MT SOLN
15.0000 mL | OROMUCOSAL | Status: AC
  Administered 2019-05-28: 15 mL via OROMUCOSAL

## 2019-05-28 MED ORDER — SODIUM CHLORIDE 0.45 % IV SOLN
INTRAVENOUS | Status: DC | PRN
  Administered 2019-05-28: 13:00:00 400 mL via INTRAVENOUS

## 2019-05-28 MED ORDER — FAMOTIDINE IN NACL 20-0.9 MG/50ML-% IV SOLN
20.0000 mg | Freq: Two times a day (BID) | INTRAVENOUS | Status: DC
  Administered 2019-05-28: 13:00:00 20 mg via INTRAVENOUS

## 2019-05-28 MED ORDER — PROPOFOL 10 MG/ML IV BOLUS
INTRAVENOUS | Status: AC
  Filled 2019-05-28: qty 20

## 2019-05-28 MED ORDER — SUCCINYLCHOLINE CHLORIDE 200 MG/10ML IV SOSY
PREFILLED_SYRINGE | INTRAVENOUS | Status: AC
  Filled 2019-05-28: qty 10

## 2019-05-28 MED ORDER — CARVEDILOL 3.125 MG PO TABS
3.1250 mg | ORAL_TABLET | Freq: Two times a day (BID) | ORAL | Status: DC
  Administered 2019-05-29 – 2019-05-31 (×6): 3.125 mg via ORAL
  Filled 2019-05-28 (×6): qty 1

## 2019-05-28 MED ORDER — LACTATED RINGERS IV SOLN
INTRAVENOUS | Status: DC
  Administered 2019-05-28: 400 mL via INTRAVENOUS

## 2019-05-28 MED ORDER — PROTAMINE SULFATE 10 MG/ML IV SOLN
INTRAVENOUS | Status: DC | PRN
  Administered 2019-05-28: 300 mg via INTRAVENOUS

## 2019-05-28 MED ORDER — CHLORHEXIDINE GLUCONATE CLOTH 2 % EX PADS
6.0000 | MEDICATED_PAD | Freq: Every day | CUTANEOUS | Status: DC
  Administered 2019-05-28 – 2019-05-31 (×4): 6 via TOPICAL

## 2019-05-28 MED ORDER — ARTIFICIAL TEARS OPHTHALMIC OINT
TOPICAL_OINTMENT | OPHTHALMIC | Status: AC
  Filled 2019-05-28: qty 3.5

## 2019-05-28 MED ORDER — LACTATED RINGERS IV SOLN: INTRAVENOUS | Status: DC

## 2019-05-28 MED ORDER — BISACODYL 5 MG PO TBEC
10.0000 mg | DELAYED_RELEASE_TABLET | Freq: Every day | ORAL | Status: DC
  Administered 2019-05-29 – 2019-05-31 (×3): 10 mg via ORAL
  Filled 2019-05-28 (×3): qty 2

## 2019-05-28 MED ORDER — SODIUM CHLORIDE 0.9 % IV SOLN: 250.0000 mL | INTRAVENOUS | Status: DC

## 2019-05-28 MED ORDER — LIDOCAINE 2% (20 MG/ML) 5 ML SYRINGE
INTRAMUSCULAR | Status: AC
  Filled 2019-05-28: qty 5

## 2019-05-28 MED ORDER — MIDAZOLAM HCL 5 MG/5ML IJ SOLN
INTRAMUSCULAR | Status: DC | PRN
  Administered 2019-05-28: 1 mg via INTRAVENOUS
  Administered 2019-05-28: 3 mg via INTRAVENOUS
  Administered 2019-05-28 (×2): 2 mg via INTRAVENOUS

## 2019-05-28 MED ORDER — HEMOSTATIC AGENTS (NO CHARGE) OPTIME
TOPICAL | Status: DC | PRN
  Administered 2019-05-28 (×6): 1 via TOPICAL

## 2019-05-28 MED ORDER — PHENYLEPHRINE 40 MCG/ML (10ML) SYRINGE FOR IV PUSH (FOR BLOOD PRESSURE SUPPORT)
PREFILLED_SYRINGE | INTRAVENOUS | Status: DC | PRN
  Administered 2019-05-28: 40 ug via INTRAVENOUS
  Administered 2019-05-28: 5 ug via INTRAVENOUS

## 2019-05-28 MED ORDER — ASPIRIN EC 325 MG PO TBEC
325.0000 mg | DELAYED_RELEASE_TABLET | Freq: Every day | ORAL | Status: DC
  Administered 2019-05-29 – 2019-06-02 (×5): 325 mg via ORAL
  Filled 2019-05-28 (×5): qty 1

## 2019-05-28 MED ORDER — ACETAMINOPHEN 160 MG/5ML PO SOLN: 650.0000 mg | Freq: Once | ORAL | Status: AC

## 2019-05-28 MED ORDER — HEPARIN SODIUM (PORCINE) 1000 UNIT/ML IJ SOLN
INTRAMUSCULAR | Status: AC
  Filled 2019-05-28: qty 1

## 2019-05-28 MED ORDER — LACTATED RINGERS IV SOLN
INTRAVENOUS | Status: DC | PRN
  Administered 2019-05-28: 07:00:00 via INTRAVENOUS

## 2019-05-28 MED ORDER — ROCURONIUM BROMIDE 10 MG/ML (PF) SYRINGE
PREFILLED_SYRINGE | INTRAVENOUS | Status: AC
  Filled 2019-05-28: qty 10

## 2019-05-28 MED ORDER — 0.9 % SODIUM CHLORIDE (POUR BTL) OPTIME
TOPICAL | Status: DC | PRN
  Administered 2019-05-28: 5000 mL

## 2019-05-28 MED ORDER — PROPOFOL 10 MG/ML IV BOLUS
INTRAVENOUS | Status: DC | PRN
  Administered 2019-05-28: 30 mg via INTRAVENOUS
  Administered 2019-05-28: 130 mg via INTRAVENOUS

## 2019-05-28 MED ORDER — PANTOPRAZOLE SODIUM 40 MG PO TBEC
40.0000 mg | DELAYED_RELEASE_TABLET | Freq: Every day | ORAL | Status: DC
  Administered 2019-05-30 – 2019-06-02 (×4): 40 mg via ORAL
  Filled 2019-05-28 (×4): qty 1

## 2019-05-28 MED ORDER — MAGNESIUM SULFATE 4 GM/100ML IV SOLN
4.0000 g | Freq: Once | INTRAVENOUS | Status: AC
  Administered 2019-05-28: 4 g via INTRAVENOUS
  Filled 2019-05-28: qty 100

## 2019-05-28 MED ORDER — ROCURONIUM BROMIDE 100 MG/10ML IV SOLN: INTRAVENOUS | Status: DC | PRN

## 2019-05-28 MED ORDER — DOCUSATE SODIUM 100 MG PO CAPS
200.0000 mg | ORAL_CAPSULE | Freq: Every day | ORAL | Status: DC
  Administered 2019-05-29 – 2019-05-31 (×3): 200 mg via ORAL
  Filled 2019-05-28 (×3): qty 2

## 2019-05-28 MED ORDER — NITROGLYCERIN IN D5W 200-5 MCG/ML-% IV SOLN: 0.0000 ug/min | INTRAVENOUS | Status: DC

## 2019-05-28 MED ORDER — ASPIRIN 81 MG PO CHEW: 324.0000 mg | CHEWABLE_TABLET | Freq: Every day | ORAL | Status: DC

## 2019-05-28 MED ORDER — PHENYLEPHRINE 40 MCG/ML (10ML) SYRINGE FOR IV PUSH (FOR BLOOD PRESSURE SUPPORT)
PREFILLED_SYRINGE | INTRAVENOUS | Status: AC
  Filled 2019-05-28: qty 10

## 2019-05-28 MED ORDER — EPHEDRINE 5 MG/ML INJ
INTRAVENOUS | Status: AC
  Filled 2019-05-28: qty 10

## 2019-05-28 MED ORDER — VANCOMYCIN HCL IN DEXTROSE 1-5 GM/200ML-% IV SOLN
1000.0000 mg | Freq: Once | INTRAVENOUS | Status: AC
  Administered 2019-05-28: 20:00:00 1000 mg via INTRAVENOUS
  Filled 2019-05-28: qty 200

## 2019-05-28 MED ORDER — POTASSIUM CHLORIDE 10 MEQ/50ML IV SOLN
10.0000 meq | INTRAVENOUS | Status: AC
  Administered 2019-05-28 (×3): 10 meq via INTRAVENOUS

## 2019-05-28 MED ORDER — TRAMADOL HCL 50 MG PO TABS
50.0000 mg | ORAL_TABLET | ORAL | Status: DC | PRN
  Administered 2019-05-28 – 2019-05-31 (×7): 100 mg via ORAL
  Filled 2019-05-28 (×7): qty 2

## 2019-05-28 MED ORDER — FENTANYL CITRATE (PF) 250 MCG/5ML IJ SOLN
INTRAMUSCULAR | Status: AC
  Filled 2019-05-28: qty 30

## 2019-05-28 MED ORDER — ROCURONIUM BROMIDE 10 MG/ML (PF) SYRINGE
PREFILLED_SYRINGE | INTRAVENOUS | Status: DC | PRN
  Administered 2019-05-28: 40 mg via INTRAVENOUS
  Administered 2019-05-28: 100 mg via INTRAVENOUS

## 2019-05-28 MED ORDER — DEXTROSE 50 % IV SOLN: 0.0000 mL | INTRAVENOUS | Status: DC | PRN

## 2019-05-28 MED ORDER — FENTANYL CITRATE (PF) 100 MCG/2ML IJ SOLN
50.0000 ug | INTRAMUSCULAR | Status: DC | PRN
  Administered 2019-05-28 – 2019-05-29 (×5): 100 ug via INTRAVENOUS
  Administered 2019-05-29: 21:00:00 50 ug via INTRAVENOUS
  Administered 2019-05-29 (×2): 100 ug via INTRAVENOUS
  Administered 2019-05-30: 11:00:00 50 ug via INTRAVENOUS
  Filled 2019-05-28 (×9): qty 2

## 2019-05-28 MED ORDER — MIDAZOLAM HCL (PF) 10 MG/2ML IJ SOLN
INTRAMUSCULAR | Status: AC
  Filled 2019-05-28: qty 2

## 2019-05-28 MED ORDER — BISACODYL 10 MG RE SUPP: 10.0000 mg | Freq: Every day | RECTAL | Status: DC

## 2019-05-28 MED ORDER — PLASMA-LYTE 148 IV SOLN
INTRAVENOUS | Status: DC | PRN
  Administered 2019-05-28: 500 mL

## 2019-05-28 MED ORDER — INSULIN REGULAR(HUMAN) IN NACL 100-0.9 UT/100ML-% IV SOLN: INTRAVENOUS | Status: DC

## 2019-05-28 MED ORDER — ACETAMINOPHEN 500 MG PO TABS
1000.0000 mg | ORAL_TABLET | Freq: Four times a day (QID) | ORAL | Status: DC
  Administered 2019-05-28 – 2019-06-02 (×18): 1000 mg via ORAL
  Filled 2019-05-28 (×18): qty 2

## 2019-05-28 MED ORDER — PHENYLEPHRINE HCL-NACL 20-0.9 MG/250ML-% IV SOLN: 0.0000 ug/min | INTRAVENOUS | Status: DC

## 2019-05-28 MED ORDER — DEXMEDETOMIDINE HCL IN NACL 400 MCG/100ML IV SOLN
0.0000 ug/kg/h | INTRAVENOUS | Status: DC
  Filled 2019-05-28: qty 100

## 2019-05-28 MED ORDER — ONDANSETRON HCL 4 MG/2ML IJ SOLN: 4.0000 mg | Freq: Four times a day (QID) | INTRAMUSCULAR | Status: DC | PRN

## 2019-05-28 MED ORDER — LEVOFLOXACIN IN D5W 750 MG/150ML IV SOLN
750.0000 mg | INTRAVENOUS | Status: DC
  Administered 2019-05-29: 750 mg via INTRAVENOUS
  Filled 2019-05-28: qty 150

## 2019-05-28 MED ORDER — PROTAMINE SULFATE 10 MG/ML IV SOLN
INTRAVENOUS | Status: AC
  Filled 2019-05-28: qty 25

## 2019-05-28 MED ORDER — SODIUM CHLORIDE 0.9% FLUSH
3.0000 mL | Freq: Two times a day (BID) | INTRAVENOUS | Status: DC
  Administered 2019-05-29 – 2019-06-01 (×5): 3 mL via INTRAVENOUS

## 2019-05-28 MED ORDER — FENTANYL CITRATE (PF) 250 MCG/5ML IJ SOLN
INTRAMUSCULAR | Status: DC | PRN
  Administered 2019-05-28 (×2): 150 ug via INTRAVENOUS
  Administered 2019-05-28 (×2): 100 ug via INTRAVENOUS
  Administered 2019-05-28: 150 ug via INTRAVENOUS
  Administered 2019-05-28: 100 ug via INTRAVENOUS
  Administered 2019-05-28: 150 ug via INTRAVENOUS
  Administered 2019-05-28 (×4): 100 ug via INTRAVENOUS
  Administered 2019-05-28: 50 ug via INTRAVENOUS

## 2019-05-28 MED ORDER — ACETAMINOPHEN 160 MG/5ML PO SOLN: 1000.0000 mg | Freq: Four times a day (QID) | ORAL | Status: DC

## 2019-05-28 MED ORDER — ACETAMINOPHEN 650 MG RE SUPP
650.0000 mg | Freq: Once | RECTAL | Status: AC
  Administered 2019-05-28: 14:00:00 650 mg via RECTAL

## 2019-05-28 MED ORDER — LACTATED RINGERS IV SOLN: 500.0000 mL | Freq: Once | INTRAVENOUS | Status: DC | PRN

## 2019-05-28 MED ORDER — HEPARIN SODIUM (PORCINE) 1000 UNIT/ML IJ SOLN
INTRAMUSCULAR | Status: DC | PRN
  Administered 2019-05-28: 30000 [IU] via INTRAVENOUS

## 2019-05-28 MED ORDER — SODIUM CHLORIDE 0.9% FLUSH: 3.0000 mL | INTRAVENOUS | Status: DC | PRN

## 2019-05-28 MED ORDER — LACTATED RINGERS IV SOLN
INTRAVENOUS | Status: DC | PRN
  Administered 2019-05-28: 08:00:00 via INTRAVENOUS

## 2019-05-28 SURGICAL SUPPLY — 82 items
ADH SKN CLS APL DERMABOND .7 (GAUZE/BANDAGES/DRESSINGS) ×2
AGENT HMST KT MTR STRL THRMB (HEMOSTASIS) ×2
BAG DECANTER FOR FLEXI CONT (MISCELLANEOUS) ×3 IMPLANT
BLADE CLIPPER SURG (BLADE) ×3 IMPLANT
BLADE STERNUM SYSTEM 6 (BLADE) ×3 IMPLANT
BLADE SURG 11 STRL SS (BLADE) ×1 IMPLANT
BNDG ELASTIC 4X5.8 VLCR STR LF (GAUZE/BANDAGES/DRESSINGS) ×3 IMPLANT
BNDG ELASTIC 6X5.8 VLCR STR LF (GAUZE/BANDAGES/DRESSINGS) ×3 IMPLANT
BNDG GAUZE ELAST 4 BULKY (GAUZE/BANDAGES/DRESSINGS) ×3 IMPLANT
CANISTER SUCT 3000ML PPV (MISCELLANEOUS) ×3 IMPLANT
CATH CPB KIT GERHARDT (MISCELLANEOUS) ×3 IMPLANT
CATH THORACIC 28FR (CATHETERS) ×3 IMPLANT
COVER WAND RF STERILE (DRAPES) ×2 IMPLANT
DEFOGGER ANTIFOG KIT (MISCELLANEOUS) ×1 IMPLANT
DERMABOND ADVANCED (GAUZE/BANDAGES/DRESSINGS) ×1
DERMABOND ADVANCED .7 DNX12 (GAUZE/BANDAGES/DRESSINGS) IMPLANT
DRAIN CHANNEL 28F RND 3/8 FF (WOUND CARE) ×3 IMPLANT
DRAPE CARDIOVASCULAR INCISE (DRAPES) ×3
DRAPE SLUSH/WARMER DISC (DRAPES) ×3 IMPLANT
DRAPE SRG 135X102X78XABS (DRAPES) ×2 IMPLANT
DRESSING PREVENA PLUS CUSTOM (GAUZE/BANDAGES/DRESSINGS) IMPLANT
DRSG AQUACEL AG ADV 3.5X14 (GAUZE/BANDAGES/DRESSINGS) ×3 IMPLANT
DRSG PREVENA PLUS CUSTOM (GAUZE/BANDAGES/DRESSINGS) ×3
ELECT BLADE 4.0 EZ CLEAN MEGAD (MISCELLANEOUS) ×3
ELECT REM PT RETURN 9FT ADLT (ELECTROSURGICAL) ×6
ELECTRODE BLDE 4.0 EZ CLN MEGD (MISCELLANEOUS) ×2 IMPLANT
ELECTRODE REM PT RTRN 9FT ADLT (ELECTROSURGICAL) ×4 IMPLANT
FELT TEFLON 1X6 (MISCELLANEOUS) ×5 IMPLANT
GAUZE SPONGE 4X4 12PLY STRL (GAUZE/BANDAGES/DRESSINGS) ×6 IMPLANT
GAUZE SPONGE 4X4 12PLY STRL LF (GAUZE/BANDAGES/DRESSINGS) ×1 IMPLANT
GAUZE SPONGE 4X4 16PLY XRAY LF (GAUZE/BANDAGES/DRESSINGS) ×1 IMPLANT
GLOVE BIO SURGEON STRL SZ 6 (GLOVE) ×3 IMPLANT
GLOVE BIO SURGEON STRL SZ 6.5 (GLOVE) ×10 IMPLANT
GLOVE BIOGEL PI IND STRL 6 (GLOVE) IMPLANT
GLOVE BIOGEL PI INDICATOR 6 (GLOVE) ×2
GLOVE INDICATOR 7.5 STRL GRN (GLOVE) ×3 IMPLANT
GOWN STRL REUS W/ TWL LRG LVL3 (GOWN DISPOSABLE) ×8 IMPLANT
GOWN STRL REUS W/TWL LRG LVL3 (GOWN DISPOSABLE) ×30
HEMOSTAT POWDER SURGIFOAM 1G (HEMOSTASIS) ×9 IMPLANT
HEMOSTAT SURGICEL .5X2 ABSORB (HEMOSTASIS) ×1 IMPLANT
HEMOSTAT SURGICEL 2X14 (HEMOSTASIS) ×3 IMPLANT
KIT BASIN OR (CUSTOM PROCEDURE TRAY) ×3 IMPLANT
KIT CATH SUCT 8FR (CATHETERS) ×3 IMPLANT
KIT SUCTION CATH 14FR (SUCTIONS) ×6 IMPLANT
KIT TURNOVER KIT B (KITS) ×3 IMPLANT
KIT VASOVIEW HEMOPRO 2 VH 4000 (KITS) ×3 IMPLANT
LEAD PACING MYOCARDI (MISCELLANEOUS) ×3 IMPLANT
MARKER GRAFT CORONARY BYPASS (MISCELLANEOUS) ×9 IMPLANT
NS IRRIG 1000ML POUR BTL (IV SOLUTION) ×15 IMPLANT
PACK E OPEN HEART (SUTURE) ×3 IMPLANT
PACK OPEN HEART (CUSTOM PROCEDURE TRAY) ×3 IMPLANT
PAD ARMBOARD 7.5X6 YLW CONV (MISCELLANEOUS) ×6 IMPLANT
PAD ELECT DEFIB RADIOL ZOLL (MISCELLANEOUS) ×3 IMPLANT
PENCIL BUTTON HOLSTER BLD 10FT (ELECTRODE) ×3 IMPLANT
POSITIONER HEAD DONUT 9IN (MISCELLANEOUS) ×3 IMPLANT
PUNCH AORTIC ROTATE  4.5MM 8IN (MISCELLANEOUS) ×1 IMPLANT
SET CARDIOPLEGIA MPS 5001102 (MISCELLANEOUS) ×1 IMPLANT
SPONGE LAP 18X18 X RAY DECT (DISPOSABLE) ×2 IMPLANT
SURGIFLO W/THROMBIN 8M KIT (HEMOSTASIS) ×1 IMPLANT
SUT BONE WAX W31G (SUTURE) ×3 IMPLANT
SUT MNCRL AB 4-0 PS2 18 (SUTURE) ×1 IMPLANT
SUT PROLENE 3 0 SH1 36 (SUTURE) ×3 IMPLANT
SUT PROLENE 4 0 RB 1 (SUTURE) ×3
SUT PROLENE 4 0 TF (SUTURE) ×6 IMPLANT
SUT PROLENE 4-0 RB1 .5 CRCL 36 (SUTURE) IMPLANT
SUT PROLENE 6 0 C 1 30 (SUTURE) ×1 IMPLANT
SUT PROLENE 6 0 CC (SUTURE) ×8 IMPLANT
SUT PROLENE 7 0 BV1 MDA (SUTURE) ×3 IMPLANT
SUT STEEL 6MS V (SUTURE) ×3 IMPLANT
SUT STEEL SZ 6 DBL 3X14 BALL (SUTURE) ×3 IMPLANT
SUT VIC AB 1 CTX 18 (SUTURE) ×6 IMPLANT
SUT VIC AB 2-0 CT1 27 (SUTURE) ×3
SUT VIC AB 2-0 CT1 TAPERPNT 27 (SUTURE) IMPLANT
SUT VIC AB 2-0 CTX 36 (SUTURE) ×1 IMPLANT
SYSTEM SAHARA CHEST DRAIN ATS (WOUND CARE) ×3 IMPLANT
TAPE CLOTH SURG 4X10 WHT LF (GAUZE/BANDAGES/DRESSINGS) ×1 IMPLANT
TOWEL GREEN STERILE (TOWEL DISPOSABLE) ×3 IMPLANT
TOWEL GREEN STERILE FF (TOWEL DISPOSABLE) ×2 IMPLANT
TRAY FOLEY SLVR 16FR TEMP STAT (SET/KITS/TRAYS/PACK) ×3 IMPLANT
TUBING LAP HI FLOW INSUFFLATIO (TUBING) ×3 IMPLANT
UNDERPAD 30X30 (UNDERPADS AND DIAPERS) ×3 IMPLANT
WATER STERILE IRR 1000ML POUR (IV SOLUTION) ×6 IMPLANT

## 2019-05-28 NOTE — Progress Notes (Signed)
      [address]       [place] [address]             [phone]    Pre Procedure note for inpatients:   Hannah Morrow has been scheduled for Procedure(s): CORONARY ARTERY BYPASS GRAFTING (CABG) (N/A) TRANSESOPHAGEAL ECHOCARDIOGRAM (TEE) (N/A) today. The various methods of treatment have been discussed with the patient. After consideration of the risks, benefits and treatment options the patient has consented to the planned procedure.   The patient has been seen and labs reviewed. There are no changes in the patient's condition to prevent proceeding with the planned procedure today.  Recent labs:  Lab Results  Component Value Date   WBC 7.1 05/28/2019   HGB 12.7 05/28/2019   HCT 41.0 05/28/2019   PLT 189 05/28/2019   GLUCOSE 107 (H) 05/28/2019   CHOL 143 05/27/2019   TRIG 141 05/27/2019   HDL 47 05/27/2019   LDLDIRECT 107.0 07/26/2017   LDLCALC 68 05/27/2019   ALT 26 07/28/2018   AST 25 07/28/2018   NA 140 05/28/2019   K 3.5 05/28/2019   CL 108 05/28/2019   CREATININE 1.32 (H) 05/28/2019   BUN 15 05/28/2019   CO2 25 05/28/2019   TSH 1.15 07/28/2018   INR 1.0 05/27/2019   HGBA1C 5.5 05/27/2019    Grace Isaac, MD 05/28/2019 7:13 AM

## 2019-05-28 NOTE — Brief Op Note (Addendum)
      [address]       [place] [address]             [phone]      05/28/2019  1:07 PM  PATIENT:  Hannah Morrow  78 y.o. female  PRE-OPERATIVE DIAGNOSIS:  CORONARY ARTERY DISEASE  POST-OPERATIVE DIAGNOSIS:  CORONARY ARTERY DISEASE  PROCEDURE:  Procedure(s):  CORONARY ARTERY BYPASS GRAFTING x 3 -LIMA to LAD -SVG to DIAGONAL -SVG to RAMUS INTERMEDIATE  ENDOSCOPIC HARVEST GREATER SAPHENOUS VEIN -Right Thigh to below the knee  TRANSESOPHAGEAL ECHOCARDIOGRAM (TEE) (N/A)  SURGEON:  Surgeon(s) and Role:    * Grace Isaac, MD - Primary  PHYSICIAN ASSISTANT: Ellwood Handler PA-C  ANESTHESIA:   general  EBL:  1050 mL   BLOOD ADMINISTERED: CELLSAVER  DRAINS: left pleural chest tube, mediastinal chest drains   LOCAL MEDICATIONS USED:  NONE  SPECIMEN:  No Specimen  DISPOSITION OF SPECIMEN:  N/A  COUNTS:  YES   DICTATION: .Dragon Dictation  PLAN OF CARE: Admit to inpatient   PATIENT DISPOSITION:  ICU - intubated and hemodynamically stable.   Delay start of Pharmacological VTE agent (>24hrs) due to surgical blood loss or risk of bleeding: yes

## 2019-05-28 NOTE — Transfer of Care (Signed)
Immediate Anesthesia Transfer of Care Note  Patient: Hannah Morrow  Procedure(s) Performed: CORONARY ARTERY BYPASS GRAFTING (CABG) using the LIMA to LAD; Endoscopic right saphenous vein harvesting to the Ramus and Diag1. (N/A Chest) TRANSESOPHAGEAL ECHOCARDIOGRAM (TEE) (N/A )  Patient Location: ICU  Anesthesia Type:General  Level of Consciousness: sedated and Patient remains intubated per anesthesia plan  Airway & Oxygen Therapy: Patient remains intubated per anesthesia plan and Patient placed on Ventilator (see vital sign flow sheet for setting)  Post-op Assessment: Report given to RN and Post -op Vital signs reviewed and stable  Post vital signs: Reviewed and stable  Last Vitals:  Vitals Value Taken Time  BP 108/47   Temp    Pulse 90 (paced)   Resp (see vent FS)   SpO2 96%     Last Pain:  Vitals:   05/28/19 0441  TempSrc: Oral  PainSc:       Patients Stated Pain Goal: 2 ([id number])  Complications: No apparent anesthesia complications

## 2019-05-28 NOTE — Anesthesia Procedure Notes (Signed)
Arterial Line Insertion Start/End11/23/2020 6:50 AM, 05/28/2019 7:00 AM Performed by: Lowella Dell, CRNA, CRNA  Patient location: Pre-op. Preanesthetic checklist: patient identified, IV checked, site marked, risks and benefits discussed, surgical consent, monitors and equipment checked, pre-op evaluation, timeout performed and anesthesia consent Lidocaine 1% used for infiltration and patient sedated Right, radial was placed Catheter size: 20 G Hand hygiene performed  and maximum sterile barriers used   Attempts: 2 Procedure performed without using ultrasound guided technique. Following insertion, dressing applied and Biopatch. Post procedure assessment: normal  Complications: bruising present from previous access. Patient tolerated the procedure well with no immediate complications. Additional procedure comments: Vascular ultrasound reviewed. Location of Aline approved by Dr Fransisco Beau.Marland Kitchen

## 2019-05-28 NOTE — Anesthesia Procedure Notes (Signed)
Central Venous Catheter Insertion Performed by: Audry Pili, MD, anesthesiologist Start/End11/23/2020 6:50 AM, 05/28/2019 7:12 AM Patient location: Pre-op. Preanesthetic checklist: patient identified, IV checked, risks and benefits discussed, surgical consent, monitors and equipment checked, pre-op evaluation, timeout performed and anesthesia consent Position: Trendelenburg Lidocaine 1% used for infiltration and patient sedated Hand hygiene performed , maximum sterile barriers used  and Seldinger technique used Catheter size: 8.5 Fr Central line was placed.MAC introducer Procedure performed using ultrasound guided technique. Ultrasound Notes:anatomy identified, needle tip was noted to be adjacent to the nerve/plexus identified, no ultrasound evidence of intravascular and/or intraneural injection and image(s) printed for medical record Attempts: 2 (First attempt, difficulty threading wire to 20cm) Following insertion, line sutured, dressing applied and Biopatch. Post procedure assessment: blood return through all ports, no air and free fluid flow  Patient tolerated the procedure well with no immediate complications. Additional procedure comments: Wire confirmed in right IJ. Unable to pass PA catheter beyond 20cm due to resistance. PA catheter placement aborted. Surgeon notified.Marland Kitchen

## 2019-05-28 NOTE — Procedures (Signed)
Extubation Procedure Note  Patient Details:   Name: Rashiya Rafiq DOB: June 15, 1941 MRN: [id number]   Airway Documentation:    Vent end date: 05/28/19 Vent end time: 1635   Evaluation  O2 sats: stable throughout Complications: No apparent complications Patient did tolerate procedure well. Bilateral Breath Sounds: Clear   Yes   Patient was extubated to a 4L [place] without any complications, dyspnea or stridor noted. Patient was instructed on IS x 5, highest goal reached was 260mL. Positive cuff leak.   Claretta Fraise 05/28/2019, 04:35 PM

## 2019-05-28 NOTE — Anesthesia Procedure Notes (Signed)
Procedure Name: Intubation Date/Time: 05/28/2019 7:45 AM Performed by: Lowella Dell, CRNA Pre-anesthesia Checklist: Patient identified, Emergency Drugs available, Suction available and Patient being monitored Patient Re-evaluated:Patient Re-evaluated prior to induction Oxygen Delivery Method: Circle System Utilized Preoxygenation: Pre-oxygenation with 100% oxygen Induction Type: IV induction Ventilation: Mask ventilation without difficulty Laryngoscope Size: Mac and 3 Grade View: Grade I Tube type: Oral Tube size: 7.5 mm Number of attempts: 1 Airway Equipment and Method: Stylet Placement Confirmation: ETT inserted through vocal cords under direct vision,  positive ETCO2 and breath sounds checked- equal and bilateral Secured at: 21 cm Tube secured with: Tape Dental Injury: Teeth and Oropharynx as per pre-operative assessment

## 2019-05-28 NOTE — Progress Notes (Signed)
  Echocardiogram Echocardiogram Transesophageal has been performed.  Juna Caban A Jaanvi Fizer 05/28/2019, 8:30 AM 

## 2019-05-28 NOTE — Anesthesia Postprocedure Evaluation (Signed)
Anesthesia Post Note  Patient: Hannah Morrow  Procedure(s) Performed: CORONARY ARTERY BYPASS GRAFTING (CABG) using the LIMA to LAD; Endoscopic right saphenous vein harvesting to the Ramus and Diag1. (N/A Chest) TRANSESOPHAGEAL ECHOCARDIOGRAM (TEE) (N/A )     Patient location during evaluation: ICU Anesthesia Type: General Level of consciousness: sedated and patient remains intubated per anesthesia plan Pain management: pain level controlled Vital Signs Assessment: post-procedure vital signs reviewed and stable Respiratory status: patient remains intubated per anesthesia plan Cardiovascular status: stable Postop Assessment: no apparent nausea or vomiting Anesthetic complications: no    Last Vitals:  Vitals:   05/28/19 1915 05/28/19 1930  BP:    Pulse: 89 89  Resp: 12 16  Temp: 37.5 C 37.5 C  SpO2: 98% 98%    Last Pain:  Vitals:   05/28/19 1905  TempSrc:   PainSc: [place]

## 2019-05-28 NOTE — Progress Notes (Signed)
Pt's cardiac index and cardiac output dropped significantly according to the [employer], therefore, RN gave albumin per standing orders. Aline is now whipped and positional despite flushing , zeroing line, and checking all connections. [employer] rep was called to verify that cardiac index and output are affected by the position of the aline. Blood pressure will now be checked with cuff. Pt stable, will continue to monitor.

## 2019-05-28 NOTE — Progress Notes (Signed)
      [address]       [place] [address]             [phone]      S/p CABG x 3  Extubated BP 114/81   Pulse 89   Temp 99 F (37.2 C)   Resp 13   Ht 5\' 2"  (1.575 m)   Wt 86.3 kg Comment: scale c  SpO2 95%   BMI 34.81 kg/m   CI= 2.1  Intake/Output Summary (Last 24 hours) at 05/28/2019 1737 Last data filed at 05/28/2019 1700 Gross per 24 hour  Intake 4313.03 ml  Output 4197 ml  Net 116.03 ml   Hct= 35, K= 4.6  Doing well early postop  Remo Lipps C. Roxan Hockey, MD [facility] [phone]

## 2019-05-28 NOTE — Telephone Encounter (Unsigned)
PJ spoke with patient's daughter and she is currently having open heart surgery.  Will call back to schedule once she recovers if needed

## 2019-05-29 ENCOUNTER — Inpatient Hospital Stay (HOSPITAL_COMMUNITY): Payer: Medicare Other

## 2019-05-29 ENCOUNTER — Encounter (HOSPITAL_COMMUNITY): Payer: Self-pay | Admitting: Cardiothoracic Surgery

## 2019-05-29 DIAGNOSIS — Z951 Presence of aortocoronary bypass graft: Secondary | ICD-10-CM

## 2019-05-29 LAB — CBC
HCT: 34.3 % — ABNORMAL LOW (ref 36.0–46.0)
HCT: 32.6 % — ABNORMAL LOW (ref 36.0–46.0)
Hemoglobin: 10.4 g/dL — ABNORMAL LOW (ref 12.0–15.0)
Hemoglobin: 10 g/dL — ABNORMAL LOW (ref 12.0–15.0)
MCH: 25.2 pg — ABNORMAL LOW (ref 26.0–34.0)
MCH: 25.3 pg — ABNORMAL LOW (ref 26.0–34.0)
MCHC: 30.3 g/dL (ref 30.0–36.0)
MCHC: 30.7 g/dL (ref 30.0–36.0)
MCV: 83.3 fL (ref 80.0–100.0)
MCV: 82.3 fL (ref 80.0–100.0)
Platelets: 147 10*3/uL — ABNORMAL LOW (ref 150–400)
Platelets: 145 10*3/uL — ABNORMAL LOW (ref 150–400)
RBC: 4.12 MIL/uL (ref 3.87–5.11)
RBC: 3.96 MIL/uL (ref 3.87–5.11)
RDW: 13.7 % (ref 11.5–15.5)
RDW: 13.9 % (ref 11.5–15.5)
WBC: 11.2 10*3/uL — ABNORMAL HIGH (ref 4.0–10.5)
WBC: 14.8 10*3/uL — ABNORMAL HIGH (ref 4.0–10.5)
nRBC: 0 % (ref 0.0–0.2)
nRBC: 0 % (ref 0.0–0.2)

## 2019-05-29 LAB — BASIC METABOLIC PANEL
Anion gap: 7 (ref 5–15)
Anion gap: 8 (ref 5–15)
BUN: 14 mg/dL (ref 8–23)
BUN: 19 mg/dL (ref 8–23)
CO2: 23 mmol/L (ref 22–32)
CO2: 22 mmol/L (ref 22–32)
Calcium: 7.9 mg/dL — ABNORMAL LOW (ref 8.9–10.3)
Calcium: 8.2 mg/dL — ABNORMAL LOW (ref 8.9–10.3)
Chloride: 107 mmol/L (ref 98–111)
Chloride: 101 mmol/L (ref 98–111)
Creatinine, Ser: 1.13 mg/dL — ABNORMAL HIGH (ref 0.44–1.00)
Creatinine, Ser: 1.42 mg/dL — ABNORMAL HIGH (ref 0.44–1.00)
GFR calc Af Amer: 54 mL/min — ABNORMAL LOW (ref >60)
GFR calc Af Amer: 41 mL/min — ABNORMAL LOW (ref >60)
GFR calc non Af Amer: 47 mL/min — ABNORMAL LOW (ref >60)
GFR calc non Af Amer: 36 mL/min — ABNORMAL LOW (ref >60)
Glucose, Bld: 110 mg/dL — ABNORMAL HIGH (ref 70–99)
Glucose, Bld: 151 mg/dL — ABNORMAL HIGH (ref 70–99)
Potassium: 4.2 mmol/L (ref 3.5–5.1)
Potassium: 4.4 mmol/L (ref 3.5–5.1)
Sodium: 137 mmol/L (ref 135–145)
Sodium: 131 mmol/L — ABNORMAL LOW (ref 135–145)

## 2019-05-29 LAB — GLUCOSE, CAPILLARY
Glucose-Capillary: 101 mg/dL — ABNORMAL HIGH (ref 70–99)
Glucose-Capillary: 103 mg/dL — ABNORMAL HIGH (ref 70–99)
Glucose-Capillary: 113 mg/dL — ABNORMAL HIGH (ref 70–99)
Glucose-Capillary: 114 mg/dL — ABNORMAL HIGH (ref 70–99)
Glucose-Capillary: 114 mg/dL — ABNORMAL HIGH (ref 70–99)
Glucose-Capillary: 116 mg/dL — ABNORMAL HIGH (ref 70–99)
Glucose-Capillary: 118 mg/dL — ABNORMAL HIGH (ref 70–99)
Glucose-Capillary: 118 mg/dL — ABNORMAL HIGH (ref 70–99)
Glucose-Capillary: 99 mg/dL (ref 70–99)

## 2019-05-29 LAB — MAGNESIUM
Magnesium: 2.6 mg/dL — ABNORMAL HIGH (ref 1.7–2.4)
Magnesium: 2 mg/dL (ref 1.7–2.4)

## 2019-05-29 MED ORDER — INSULIN ASPART 100 UNIT/ML ~~LOC~~ SOLN: 0.0000 [IU] | SUBCUTANEOUS | Status: DC

## 2019-05-29 MED ORDER — ENOXAPARIN SODIUM 40 MG/0.4ML ~~LOC~~ SOLN
40.0000 mg | Freq: Every day | SUBCUTANEOUS | Status: DC
  Administered 2019-05-29: 40 mg via SUBCUTANEOUS
  Filled 2019-05-29: qty 0.4

## 2019-05-29 MED ORDER — FUROSEMIDE 10 MG/ML IJ SOLN
40.0000 mg | Freq: Once | INTRAMUSCULAR | Status: AC
  Administered 2019-05-29: 40 mg via INTRAVENOUS
  Filled 2019-05-29: qty 4

## 2019-05-29 NOTE — Plan of Care (Signed)
  Problem: Clinical Measurements: Goal: Ability to maintain clinical measurements within normal limits will improve Outcome: Progressing Goal: Respiratory complications will improve Outcome: Progressing   Problem: Activity: Goal: Risk for activity intolerance will decrease Outcome: Progressing   Problem: Elimination: Goal: Will not experience complications related to urinary retention Outcome: Progressing   Problem: Pain Managment: Goal: General experience of comfort will improve Outcome: Progressing   Problem: Safety: Goal: Ability to remain free from injury will improve Outcome: Progressing   Problem: Skin Integrity: Goal: Risk for impaired skin integrity will decrease Outcome: Progressing   Problem: Skin Integrity: Goal: Risk for impaired skin integrity will decrease Outcome: Progressing   Problem: Cardiovascular: Goal: Ability to achieve and maintain adequate cardiovascular perfusion will improve Outcome: Progressing Goal: Vascular access site(s) Level 0-1 will be maintained Outcome: Progressing

## 2019-05-29 NOTE — Progress Notes (Addendum)
[facility] DAILY ICU PROGRESS NOTE                   Hannah Morrow.[address]            [place] [address]          [phone]   1 Day Post-Op Procedure(s) (LRB): CORONARY ARTERY BYPASS GRAFTING (CABG) using the LIMA to LAD; Endoscopic right saphenous vein harvesting to the Ramus and Diag1. (N/A) TRANSESOPHAGEAL ECHOCARDIOGRAM (TEE) (N/A)  Total Length of Stay:  LOS: 4 days   Subjective:  Patient with non specific complaints.  Denies N/V.  Has some pain responds to pain medication  Objective: Vital signs in last 24 hours: Temp:  [97.9 F (36.6 C)-99.9 F (37.7 C)] 98.8 F (37.1 C) (11/24 0600) Pulse Rate:  [79-102] 89 (11/24 0600) Cardiac Rhythm: Atrial paced (11/24 0400) Resp:  [0-20] 19 (11/24 0600) BP: (97-153)/(47-81) 115/73 (11/24 0600) SpO2:  [95 %-100 %] 96 % (11/24 0600) Arterial Line BP: (71-150)/(49-87) 115/51 (11/24 0600) FiO2 (%):  [40 %-50 %] 40 % (11/23 1600) Weight:  [90.9 kg] 90.9 kg (11/24 0500)  Filed Weights   05/27/19 0541 05/28/19 0441 05/29/19 0500  Weight: 86.2 kg 86.3 kg 90.9 kg    Weight change: 4.58 kg   Hemodynamic parameters for last 24 hours: CVP:  [5 mmHg-7 mmHg] 7 mmHg  Intake/Output from previous day: 11/23 0701 - 11/24 0700 In: 4338.7 [I.V.:2976.2; Blood:582; IV Piggyback:780.5] Out: [id number] [Urine:1735; Blood:1050; Chest Tube:350]   Current Meds: Scheduled Meds: . acetaminophen  1,000 mg Oral Q6H   Or  . acetaminophen (TYLENOL) oral liquid 160 mg/5 mL  1,000 mg Per Tube Q6H  . aspirin EC  325 mg Oral Daily   Or  . aspirin  324 mg Per Tube Daily  . atorvastatin  80 mg Oral q1800  . bisacodyl  10 mg Oral Daily   Or  . bisacodyl  10 mg Rectal Daily  . carvedilol  3.125 mg Oral BID WC  . Chlorhexidine Gluconate Cloth  6 each Topical Daily  . docusate sodium  200 mg Oral Daily  . enoxaparin (LOVENOX) injection  40 mg Subcutaneous QHS  . insulin aspart  0-24 Units Subcutaneous Q4H  . levothyroxine  112 mcg Oral Q0600  .  [START ON 05/30/2019] pantoprazole  40 mg Oral Daily  . sodium chloride flush  3 mL Intravenous Q12H   Continuous Infusions: . sodium chloride 10 mL/hr at 05/29/19 0600  . sodium chloride    . sodium chloride    . albumin human 12.5 g (05/28/19 2114)  . dexmedetomidine (PRECEDEX) IV infusion Stopped (05/28/19 1523)  . lactated ringers    . lactated ringers 20 mL/hr at 05/29/19 0600  . lactated ringers    . levofloxacin (LEVAQUIN) IV    . nitroGLYCERIN Stopped (05/28/19 1852)  . phenylephrine (NEO-SYNEPHRINE) Adult infusion Stopped (05/28/19 1523)   PRN Meds:.sodium chloride, albumin human, fentaNYL (SUBLIMAZE) injection, lactated ringers, midazolam, ondansetron (ZOFRAN) IV, sodium chloride flush, traMADol  General appearance: alert, cooperative and no distress Heart: regular rate and rhythm Lungs: clear to auscultation bilaterally Abdomen: soft, non-tender; bowel sounds normal; no masses,  no organomegaly Extremities: edema trace Wound: clean and dry  Lab Results: CBC: Recent Labs    05/28/19 1858 05/29/19 0243  WBC 10.9* 11.2*  HGB 11.3* 10.4*  HCT 36.1 34.3*  PLT 127* 147*   BMET:  Recent Labs    05/28/19 1858 05/29/19 0243  NA 137 137  K  4.6 4.2  CL 110 107  CO2 22 23  GLUCOSE 148* 110*  BUN 14 14  CREATININE 1.11* 1.13*  CALCIUM 7.6* 7.9*    CMET: Lab Results  Component Value Date   WBC 11.2 (H) 05/29/2019   HGB 10.4 (L) 05/29/2019   HCT 34.3 (L) 05/29/2019   PLT 147 (L) 05/29/2019   GLUCOSE 110 (H) 05/29/2019   CHOL 143 05/27/2019   TRIG 141 05/27/2019   HDL 47 05/27/2019   LDLDIRECT 107.0 07/26/2017   LDLCALC 68 05/27/2019   ALT 26 07/28/2018   AST 25 07/28/2018   NA 137 05/29/2019   K 4.2 05/29/2019   CL 107 05/29/2019   CREATININE 1.13 (H) 05/29/2019   BUN 14 05/29/2019   CO2 23 05/29/2019   TSH 1.15 07/28/2018   INR 1.4 (H) 05/28/2019   HGBA1C 5.5 05/27/2019      PT/INR:  Recent Labs    05/28/19 1245  LABPROT 16.6*  INR 1.4*    Radiology: Dg Chest Port 1 View  Result Date: 05/28/2019 CLINICAL DATA:  Status post coronary bypass graft. EXAM: PORTABLE CHEST 1 VIEW COMPARISON:  May 26, 2019. FINDINGS: Stable cardiomediastinal silhouette. Endotracheal and nasogastric tubes are in grossly good position. Left-sided chest tube is noted without pneumothorax. Lungs are clear. Bony thorax is unremarkable. IMPRESSION: Endotracheal and nasogastric tubes are in grossly good position. Left-sided chest tube is noted without pneumothorax. No acute cardiopulmonary abnormality is noted. Electronically Signed   By: Marijo Conception M.D.   On: 05/28/2019 13:11     Assessment/Plan: S/P Procedure(s) (LRB): CORONARY ARTERY BYPASS GRAFTING (CABG) using the LIMA to LAD; Endoscopic right saphenous vein harvesting to the Ramus and Diag1. (N/A) TRANSESOPHAGEAL ECHOCARDIOGRAM (TEE) (N/A)  1. CV- NSR, BP ok- will resume home Cozaar at reduced dose 2. Pulm- CXR with bilateral atelectasis, no significant effusions, leave pleural chest tubes in place today, will d/c MT tubes 3. Renal- creatinine stable, weight is up about 10 lbs, will give 40 mg IV lasix today 4. Expected post operative blood loss anemia, Hgb stable at 10.4 5. Expected post operative mild thrombocytopenia, mild will start Lovenox for DVT prophylaxis, place PAS hose 6. CBGs controlled, patient is off insulin drip 7. Dispo- patient maintaining NSR, will start home Cozaar at reduced dose, d/c MT tubes, start gentle diuretics, patient will require a lot of encouragement to get up and ambulate, POD #1 progression orders  Ellwood Handler, PA-C  05/29/2019 7:34 AM   Patient stable this am, d/c lines and tubes  moblize I have seen and examined Hannah Morrow and agree with the above assessment  and plan.  Grace Isaac MD Beeper [phone] Office [phone] 05/29/2019 7:57 AM

## 2019-05-29 NOTE — Op Note (Signed)
NAME: VERLEY, MUCCIO [facility] MEDICAL RECORD [id number] ACCOUNT [id number] DATE OF BIRTH:02-22-1941 FACILITY: [facility] LOCATION: [facility] PHYSICIAN:Melik Blancett Bari Leigha Olberding, MD  OPERATIVE REPORT  DATE OF PROCEDURE:  05/28/2019  PREOPERATIVE DIAGNOSIS:  Unstable angina.  POSTOPERATIVE DIAGNOSES:  Unstable angina.  SURGICAL PROCEDURE:  Coronary artery bypass grafting x3 with the left internal mammary to the left anterior descending coronary artery, reverse saphenous vein graft to the diagonal coronary artery, reverse saphenous vein graft to the intermediate  coronary artery, with right thigh endoscopic right greater saphenous vein harvesting.  SURGEON:  Sheliah Plane, MD  FIRST ASSISTANT:  Lowella Dandy, PA-C  BRIEF HISTORY:  The patient is a 78 year old female who presented to the cardiology office with increasing episodes of substernal chest pain with exertion.  She was scheduled for evaluation including cardiac CT; however, before this could be carried out  she had recurrent pain while walking her dog, called the cardiology office and was scheduled for cardiac catheterization done by Dr. Herbie Baltimore.  At time of catheterization, she was found to have intact right coronary artery with a greater than 95% ostial  LAD lesion and 70-80% mid-LAD lesion.  In addition, there was a 70% lesion of a large ramus branch that was the primary supplier of the lateral wall of the heart.  Overall, ventricular function was preserved.  Because of the complex nature of the  anatomy, it was not felt suitable for angioplasty or stenting, coronary artery bypass grafting was recommended to the patient who agreed and signed informed consent.  Risks and options have been discussed with her in detail.  DESCRIPTION OF PROCEDURE:  With Swan-Ganz introducer in place attempt to place a Swan catheter was unsuccessful.  Flow track was attached to the patient's arterial line.  She underwent general endotracheal anesthesia by Dr.  Mal Amabile.  TEE probe was placed  and showed good LV function without significant valvular abnormalities.  Skin of the chest and legs were prepped with Betadine, draped in the usual sterile manner.  Appropriate timeout was performed and we proceeded with right greater saphenous vein  harvesting.  The vein was slightly enlarged with some varicosities, but after removal, 2 segments of vein were selected without varicosities.  Median sternotomy was performed.  The left internal mammary artery was dissected down as a pedicle graft.  The  distal artery was divided and had good free flow.  The pericardium was opened.  Overall, ventricular function appeared preserved.  The patient was systemically heparinized.  The ascending aorta was cannulated.  The right atrium was cannulated with a dual  stage venous cannula.  The patient was placed on cardiopulmonary bypass 2.4 liters per minute per meter square.  Aortic root vent cardioplegia needle was introduced into the ascending aorta.  Sites of anastomosis were selected and dissected at the  epicardium.  The patient's body temperature was cooled to 32 degrees.  Aortic crossclamp was applied and 500 mL of cold blood potassium cardioplegia was administered with diastolic arrest of the heart.  Myocardial septal temperatures monitored throughout  the crossclamp.  Topical cold was also used.  We then elevated the heart and the intermediate coronary artery was opened and admitted a 1.5 mm probe.  Using a running 7-0 Prolene, distal anastomosis was performed with a segment of reverse saphenous vein  graft.  We then turned our attention to the diagonal coronary artery, which was smaller, but did admit a 1.5 mm probe.  Using a running 7-0 Prolene, distal anastomosis was performed.  Additional cold blood cardioplegia  was administered down the vein  grafts.  We then turned our attention to the mid left anterior descending coronary artery, which was opened and admitted 1.5 mm probe  proximally and distally.  Using a running 8-0 Prolene, the left internal mammary artery was anastomosed to the left  anterior descending coronary artery.  With the crossclamp still in place, 2 punch aortotomies were performed and each of the 2 vein grafts were anastomosed to their ascending aorta.  The bulldog was removed from the mammary artery with prompt rise in  myocardial septal temperature.  The fascia of the mammary was tacked to the epicardium.  The heart was allowed to passively fill and deair.  The proximal anastomoses were completed and aortic crossclamp was removed with total crossclamp time of 77  minutes.  Sites of anastomosis were inspected and were free of bleeding.  With the patient's body temperature rewarmed to 37 degrees atrial and ventricular pacing wires were applied.  She was atrially paced at 90 to increased rate.  She was ventilated  and weaned from cardiopulmonary bypass without difficulty.  TEE showed good LV function and adequate filling of the heart.  Total crossclamp time was 77 minutes.  She was decannulated in the usual fashion.  Protamine sulfate was administered with  operative field hemostatic.  Graft markers were applied.  A left pleural tube and Blake mediastinal drain were left in place.  The pericardium was loosely reapproximated.  Sternum was closed with 6 stainless steel wire.  Fascia closed with interrupted 0  Vicryl, running 3-0 Vicryl in subcutaneous tissue.  A Prevena incisional wound VAC was applied.  The patient was then transferred to the surgical intensive care unit for further postoperative care, having tolerated the procedure without obvious  complication.  Sponge and needle count was reported as correct at completion of the procedure.  RF scanning reported clear code.  The patient did not require any blood bank blood products during the operative procedure.  CN/NUANCE  D:05/29/2019 T:05/29/2019 JOB:[id number]

## 2019-05-29 NOTE — Discharge Instructions (Signed)

## 2019-05-29 NOTE — Progress Notes (Signed)
Patient ID: Brylei Precourt, female   DOB: 1940-11-05, 78 y.o.   MRN: [id number] EVENING ROUNDS NOTE :     [address]       ,[place] [address]             [phone]                 1 Day Post-Op Procedure(s) (LRB): CORONARY ARTERY BYPASS GRAFTING (CABG) using the LIMA to LAD; Endoscopic right saphenous vein harvesting to the Ramus and Diag1. (N/A) TRANSESOPHAGEAL ECHOCARDIOGRAM (TEE) (N/A)  Total Length of Stay:  LOS: 4 days  BP (!) 106/47   Pulse 81   Temp 98.3 F (36.8 C) (Oral)   Resp 16   Ht 5\' 2"  (1.575 m)   Wt 90.9 kg   SpO2 96%   BMI 36.65 kg/m   .Intake/Output      11/24 0701 - 11/25 0700   I.V. (mL/kg) 57.8 (0.6)   Blood    IV Piggyback    Total Intake(mL/kg) 57.8 (0.6)   Urine (mL/kg/hr) 575 (0.5)   Other    Blood    Chest Tube 40   Total Output 615   Net -557.2         . sodium chloride Stopped (05/29/19 0802)  . sodium chloride    . sodium chloride    . dexmedetomidine (PRECEDEX) IV infusion Stopped (05/28/19 1523)  . lactated ringers    . lactated ringers Stopped (05/29/19 0751)  . lactated ringers    . nitroGLYCERIN Stopped (05/28/19 1852)  . phenylephrine (NEO-SYNEPHRINE) Adult infusion Stopped (05/28/19 1523)     Lab Results  Component Value Date   WBC 14.8 (H) 05/29/2019   HGB 10.0 (L) 05/29/2019   HCT 32.6 (L) 05/29/2019   PLT 145 (L) 05/29/2019   GLUCOSE 110 (H) 05/29/2019   CHOL 143 05/27/2019   TRIG 141 05/27/2019   HDL 47 05/27/2019   LDLDIRECT 107.0 07/26/2017   LDLCALC 68 05/27/2019   ALT 26 07/28/2018   AST 25 07/28/2018   NA 137 05/29/2019   K 4.2 05/29/2019   CL 107 05/29/2019   CREATININE 1.13 (H) 05/29/2019   BUN 14 05/29/2019   CO2 23 05/29/2019   TSH 1.15 07/28/2018   INR 1.4 (H) 05/28/2019   HGBA1C 5.5 05/27/2019   Stable day , up to chair    Grace Isaac MD  Beeper [phone] Office [phone] 05/29/2019 7:11 PM

## 2019-05-29 NOTE — Discharge Summary (Signed)
Physician Discharge Summary  Patient ID: Hannah Morrow MRN: [id number] DOB/AGE: May 12, 1941 78 y.o.  Admit date: 05/25/2019 Discharge date: 06/02/2019  Admission Diagnoses:  Patient Active Problem List   Diagnosis Date Noted  . Progressive angina ([facility]) 05/25/2019  . Coronary artery disease involving native coronary artery of native heart with unstable angina pectoris ([place]) 05/25/2019  . Abnormal EKG 05/23/2019  . BACK PAIN 08/26/2010  . Vitamin D deficiency 09/04/2009  . GERD 09/04/2009  . Chest pain with high risk for cardiac etiology 12/10/2008  . FATIGUE 08/29/2008  . RESTLESS LEGS SYNDROME 09/28/2007  . INSOMNIA 09/28/2007  . PALPITATIONS 09/28/2007  . Hypothyroidism 08/10/2007  . Dyslipidemia, goal LDL below 70 08/10/2007  . ANXIETY 08/10/2007  . Essential hypertension 08/10/2007  . ASTHMA 08/10/2007   Discharge Diagnoses:   Patient Active Problem List   Diagnosis Date Noted  . S/P CABG x 3 05/29/2019  . Progressive angina ([place]) 05/25/2019  . Coronary artery disease involving native coronary artery of native heart with unstable angina pectoris ([place]) 05/25/2019  . Abnormal EKG 05/23/2019  . BACK PAIN 08/26/2010  . Vitamin D deficiency 09/04/2009  . GERD 09/04/2009  . Chest pain with high risk for cardiac etiology 12/10/2008  . FATIGUE 08/29/2008  . RESTLESS LEGS SYNDROME 09/28/2007  . INSOMNIA 09/28/2007  . PALPITATIONS 09/28/2007  . Hypothyroidism 08/10/2007  . Dyslipidemia, goal LDL below 70 08/10/2007  . ANXIETY 08/10/2007  . Essential hypertension 08/10/2007  . ASTHMA 08/10/2007   Discharged Condition: good  History of Present Illness:  Mrs. Hannah Morrow is a 78 yo obese female with history of HTN and Hyperlipidemia.  She presented with exertional chest discomfort that started in October while ambulating.  The pain would relieve with rest or spontaneously.  She was evaluated by Dr. Gwenlyn Found on 11/4 who recommended at CTA of the chest, however this  couldn't be done until December.  She contacted the Cardiology office with complaints of chest pain that occurred when walking the dog.  The pain radiated down her left arm but was not associated with shortness of breath, nausea, or diaphoresis.  It was felt she should undergo cardiac catheterization on 05/25/2019. This showed a preserved EF and 2 vessel CAD.  It was felt coronary bypass grafting would be indicated, she was admitted and cardiothoracic surgery consult was requested.  Hospital Course:   She remained chest pain free during hospitalization.  The patient was evaluted by Dr. Servando Snare who was in agreement the patient would benefit from coronary bypass grafting procedure.  The risks and benefits of the procedure were explained to the patient and she was agreeable to proceed.  The patient was taken to the operating room on 05/28/2019.  She underwent CABG x 3 utilizing LIMA to LAD, SVG to Ramus Intermediate, and SVG to Diagonal.  She also underwent endoscopic harvest of greater saphenous vein from the right leg. She tolerated the procedure without difficulty and was taken to the SICU in stable condition.  The patient was extubated without difficulty.  During her stay in the SICU the patient's arterial and central were removed without difficulty.  The patient's chest tubes were removed without difficulty.  She developed a mild elevation in her creatinine.  It was felt to be related to IV diuretics.  She was maintaining NSR.  She was medically stable for transfer to the progressive care unit.  The patient continued to make good progress.  She remains in NSR.  Her pacing wires have been removed without difficulty.  Her creatinine level increased to 1.84 and this was felt to be due to Lasix.  This recovered to her baseline level.  Her coreg dose has been increased to her home regimen for additional HR and BP control.n  She is ambulating with assist of a rolling walker.  Her Pravena wound vac has been removed.   Her incisions are healing without evidence of infection.  She is medically stable for discharge home today.      Significant Diagnostic Studies: angiography:   SEVERE TWO-VESSEL DISEASE:  -Ostial LAD 95% ulcerated lesion followed by 90% proximal LAD just prior to1st Diag extending beyond the branch.  -70% proximal large branching RAMUS INTERMEDIUS (RI)  Preserved EF of 50 to 55% with anteroapical-apical-inferoapical hypokinesis  Treatments: surgery:   Coronary artery bypass grafting x3 with the left internal mammary to the left anterior descending coronary artery, reverse saphenous vein graft to the diagonal coronary artery, reverse saphenous vein graft to the intermediate  coronary artery, with right thigh endoscopic right greater saphenous vein harvesting.  Discharge Exam: Blood pressure (!) 155/90, pulse 95, temperature 98.8 F (37.1 C), temperature source Oral, resp. rate 20, height 5\' 2"  (1.575 m), weight 87.7 kg, SpO2 97 %.   General appearance: alert, cooperative and no distress Heart: regular rate and rhythm, S1, S2 normal, no murmur, click, rub or gallop Lungs: clear to auscultation bilaterally Abdomen: soft, non-tender; bowel sounds normal; no masses,  no organomegaly Extremities: extremities normal, atraumatic, no cyanosis or edema Wound: clean and dry covered with a prevena dressing   Discharge Medications:   The patient has been discharged on:   1.Beta Blocker:  Yes [ X  ]                              No   [   ]                              If No, reason:  2.Ace Inhibitor/ARB: Yes [   ]                                     No  [    ]                                     If No, reason:  3.Statin:   Yes [ X  ]                  No  [   ]                  If No, reason:  4.Ecasa:  Yes  Valu.Nieves  ]                  No   [   ]                  If No, reason:    Discharge Instructions    Amb Referral to Cardiac Rehabilitation   Complete by: As directed     Diagnosis: CABG   CABG X ___: 3   After initial evaluation and assessments completed: Virtual Based Care may be provided alone or in conjunction with Phase 2  Cardiac Rehab based on patient barriers.: Yes   Discharge patient   Complete by: As directed    Daughter will be home to help   Discharge disposition: 01-Home or Self Care   Discharge patient date: 06/02/2019     Allergies as of 06/02/2019      Reactions   Penicillins Shortness Of Breath   red face Did it involve swelling of the face/tongue/throat, SOB, or low BP? Yes Did it involve sudden or severe rash/hives, skin peeling, or any reaction on the inside of your mouth or nose? Yes Did you need to seek medical attention at a hospital or doctor's office? Yes When did it last happen?40+ years If all above answers are "NO", may proceed with cephalosporin use.   Amlodipine    Pt states it made her feel "awful and weird."   Metoprolol Other (See Comments)   " feel weird"   Betadine [povidone Iodine] Rash   Hydrocodone-acetaminophen Rash   ras      Medication List    STOP taking these medications   ibuprofen 200 MG tablet Commonly known as: ADVIL   metoprolol tartrate 50 MG tablet Commonly known as: LOPRESSOR   nitroGLYCERIN 0.4 MG SL tablet Commonly known as: NITROSTAT   simvastatin 40 MG tablet Commonly known as: ZOCOR     TAKE these medications   acetaminophen 500 MG tablet Commonly known as: TYLENOL Take 2 tablets (1,000 mg total) by mouth every 6 (six) hours.   aspirin 325 MG EC tablet Take 1 tablet (325 mg total) by mouth daily. What changed:   medication strength  how much to take   atorvastatin 80 MG tablet Commonly known as: LIPITOR Take 1 tablet (80 mg total) by mouth daily at 6 PM.   b complex vitamins tablet Take 1 tablet by mouth daily.   calcium carbonate 500 MG chewable tablet Commonly known as: TUMS - dosed in mg elemental calcium Chew 1 tablet by mouth daily as needed for  indigestion or heartburn.   carvedilol 6.25 MG tablet Commonly known as: COREG Take 1 tablet (6.25 mg total) by mouth 2 (two) times daily with a meal.   diphenhydrAMINE 25 MG tablet Commonly known as: BENADRYL Take 25 mg by mouth at bedtime.   fluocinonide ointment 0.05 % Commonly known as: LIDEX Apply 1 application topically daily as needed (itching).   Levoxyl 112 MCG tablet Generic drug: levothyroxine TAKE 1 TABLET DAILY What changed: how much to take   lisinopril 20 MG tablet Commonly known as: ZESTRIL Take 1 tablet (20 mg total) by mouth daily. What changed:   medication strength  how much to take   omeprazole 20 MG capsule Commonly known as: PRILOSEC TAKE 1 CAPSULE DAILY   traMADol 50 MG tablet Commonly known as: ULTRAM Take 1 tablet (50 mg total) by mouth every 6 (six) hours as needed for moderate pain.   vitamin C 1000 MG tablet Take 1,000 mg by mouth daily.   Vitamin D 50 MCG (2000 UT) Caps Take by mouth.      Follow-up Information    [facility] Follow up on 07/03/2019.   Specialty: Cardiothoracic Surgery Why: Appoitment is at 1:00, please get CXR at 12:30 at [facility] located on first floor of our office building Contact information: [place], [place] [phone]       Lorretta Harp, MD Follow up on 06/13/2019.   Specialties: Cardiology, Radiology Why: Appointment is  at 2:30 Contact information: [address] [place] [address] [phone]           Signed:  Elgie Collard, PA-C 06/02/2019, 8:22 AM

## 2019-05-29 NOTE — Progress Notes (Addendum)
Progress Note  Patient Name: Hannah Morrow Date of Encounter: 05/29/2019  Primary Cardiologist: No primary care provider on file.   Subjective   [name] reports that she is still having some chest pain today, located around the surgical site. Also reports some right knee pain near the surgical site. She denies any shortness of breath.   Inpatient Medications    Scheduled Meds:  acetaminophen  1,000 mg Oral Q6H   Or   acetaminophen (TYLENOL) oral liquid 160 mg/5 mL  1,000 mg Per Tube Q6H   aspirin EC  325 mg Oral Daily   Or   aspirin  324 mg Per Tube Daily   atorvastatin  80 mg Oral q1800   bisacodyl  10 mg Oral Daily   Or   bisacodyl  10 mg Rectal Daily   carvedilol  3.125 mg Oral BID WC   Chlorhexidine Gluconate Cloth  6 each Topical Daily   docusate sodium  200 mg Oral Daily   insulin aspart  0-24 Units Subcutaneous Q4H   levothyroxine  112 mcg Oral Q0600   [START ON 05/30/2019] pantoprazole  40 mg Oral Daily   sodium chloride flush  3 mL Intravenous Q12H   Continuous Infusions:  sodium chloride 10 mL/hr at 05/29/19 0600   sodium chloride     sodium chloride     albumin human 12.5 g (05/28/19 2114)   dexmedetomidine (PRECEDEX) IV infusion Stopped (05/28/19 1523)   lactated ringers     lactated ringers 20 mL/hr at 05/29/19 0600   lactated ringers     levofloxacin (LEVAQUIN) IV     nitroGLYCERIN Stopped (05/28/19 1852)   phenylephrine (NEO-SYNEPHRINE) Adult infusion Stopped (05/28/19 1523)   PRN Meds: sodium chloride, albumin human, fentaNYL (SUBLIMAZE) injection, lactated ringers, midazolam, ondansetron (ZOFRAN) IV, sodium chloride flush, traMADol   Vital Signs    Vitals:   05/29/19 0300 05/29/19 0400 05/29/19 0500 05/29/19 0600  BP: 97/64 110/68 120/66 115/73  Pulse: 90 90 89 89  Resp: 11 10 10 19   Temp: 99 F (37.2 C) 98.8 F (37.1 C) 98.8 F (37.1 C) 98.8 F (37.1 C)  TempSrc:  Bladder    SpO2: 96% 97% 96%  96%  Weight:   90.9 kg   Height:        Intake/Output Summary (Last 24 hours) at 05/29/2019 0718 Last data filed at 05/29/2019 0600 Gross per 24 hour  Intake 4338.69 ml  Output 4752 ml  Net -413.31 ml   Filed Weights   05/27/19 0541 05/28/19 0441 05/29/19 0500  Weight: 86.2 kg 86.3 kg 90.9 kg    Telemetry    NSR - Personally Reviewed  ECG    NSR, no ST changes - Personally Reviewed  Physical Exam   GEN: No acute distress.   Neck: No JVD Cardiac: RRR, no murmurs, rubs, or gallops.  Respiratory: Clear to auscultation bilaterally. GI: Soft, nontender, non-distended  MS: No edema; No deformity. Chest tube in place. Substernal chest wound packing in place.  Neuro:  Nonfocal  Psych: Normal affect   Labs    Chemistry Recent Labs  Lab 05/28/19 0522  05/28/19 1141  05/28/19 1754 05/28/19 1858 05/29/19 0243  NA 140   < > 140   < > 141 137 137  K 3.5   < > 3.7   < > 4.6 4.6 4.2  CL 108   < > 102  --   --  110 107  CO2 25  --   --   --   --  22 23  GLUCOSE 107*   < > 168*  --   --  148* 110*  BUN 15   < > 13  --   --  14 14  CREATININE 1.32*   < > 0.90  --   --  1.11* 1.13*  CALCIUM 8.7*  --   --   --   --  7.6* 7.9*  GFRNONAA 39*  --   --   --   --  48* 47*  GFRAA 45*  --   --   --   --  55* 54*  ANIONGAP 7  --   --   --   --  5 7   < > = values in this interval not displayed.     Hematology Recent Labs  Lab 05/28/19 1245  05/28/19 1754 05/28/19 1858 05/29/19 0243  WBC 10.9*  --   --  10.9* 11.2*  RBC 4.31  --   --  4.41 4.12  HGB 10.9*   < > 11.2* 11.3* 10.4*  HCT 35.1*   < > 33.0* 36.1 34.3*  MCV 81.4  --   --  81.9 83.3  MCH 25.3*  --   --  25.6* 25.2*  MCHC 31.1  --   --  31.3 30.3  RDW 13.2  --   --  13.2 13.7  PLT 103*  --   --  127* 147*   < > = values in this interval not displayed.    Cardiac EnzymesNo results for input(s): TROPONINI in the last 168 hours. No results for input(s): TROPIPOC in the last 168 hours.   BNPNo results for  input(s): BNP, PROBNP in the last 168 hours.   DDimer No results for input(s): DDIMER in the last 168 hours.   Radiology    Dg Chest Port 1 View  Result Date: 05/28/2019 CLINICAL DATA:  Status post coronary bypass graft. EXAM: PORTABLE CHEST 1 VIEW COMPARISON:  May 26, 2019. FINDINGS: Stable cardiomediastinal silhouette. Endotracheal and nasogastric tubes are in grossly good position. Left-sided chest tube is noted without pneumothorax. Lungs are clear. Bony thorax is unremarkable. IMPRESSION: Endotracheal and nasogastric tubes are in grossly good position. Left-sided chest tube is noted without pneumothorax. No acute cardiopulmonary abnormality is noted. Electronically Signed   By: Marijo Conception M.D.   On: 05/28/2019 13:11   Vas [place] Lower Extremity Saphenous Vein Mapping  Result Date: 05/27/2019 LOWER EXTREMITY VEIN MAPPING Indications: Pre-op History:     Varicose veins.  Performing Technologist: June Leap RDMS, RVT  Examination Guidelines: A complete evaluation includes B-mode imaging, spectral Doppler, color Doppler, and power Doppler as needed of all accessible portions of each vessel. Bilateral testing is considered an integral part of a complete examination. Limited examinations for reoccurring indications may be performed as noted. +--------------+---------------+--------------------+--------------+-----------+   RT Diameter     RT Findings           GSV           LT Diameter   LT Findings        (cm)                                                (cm)                   +--------------+---------------+--------------------+--------------+-----------+  0.59                         Saphenofemoral         0.55                                                         Junction                                   +--------------+---------------+--------------------+--------------+-----------+       0.55                         Proximal thigh         0.61                    +--------------+---------------+--------------------+--------------+-----------+       0.56                           Mid thigh            0.56                   +--------------+---------------+--------------------+--------------+-----------+       0.72         branching        Distal thigh          0.49                   +--------------+---------------+--------------------+--------------+-----------+       0.41         branching            Knee              0.52       branching   +--------------+---------------+--------------------+--------------+-----------+       0.59                           Prox calf            0.33       branching   +--------------+---------------+--------------------+--------------+-----------+       0.21         multiple           Mid calf            0.32       branching                      branches                                                      +--------------+---------------+--------------------+--------------+-----------+                                     Distal calf           0.33       branching   +--------------+---------------+--------------------+--------------+-----------+  Diagnosing physician: Servando Snare MD Electronically signed by Servando Snare MD on 05/27/2019 at 10:57:30 AM.    Final    Vas US Doppler Pre Cabg  Result Date: 05/27/2019 PREOPERATIVE VASCULAR EVALUATION  Indications:      Pre-CABG. Risk Factors:     Hypertension, hyperlipidemia. Comparison Study: no prior Performing Technologist: June Leap RDMS, RVT  Examination Guidelines: A complete evaluation includes B-mode imaging, spectral Doppler, color Doppler, and power Doppler as needed of all accessible portions of each vessel. Bilateral testing is considered an integral part of a complete examination. Limited examinations for reoccurring indications may be performed as noted.  Right Carotid Findings: +----------+--------+--------+--------+--------+---------+             PSV cm/s EDV  cm/s Stenosis Describe Comments   +----------+--------+--------+--------+--------+---------+  CCA Prox   119      14                                    +----------+--------+--------+--------+--------+---------+  CCA Distal 69       14                                    +----------+--------+--------+--------+--------+---------+  ICA Prox   78       20       1-39%    calcific Shadowing  +----------+--------+--------+--------+--------+---------+  ICA Distal 72       17                                    +----------+--------+--------+--------+--------+---------+  ECA        135      1                                     +----------+--------+--------+--------+--------+---------+ Portions of this table do not appear on this page. +----------+--------+-------+----------------+------------+             PSV cm/s EDV cms Describe         Arm Pressure  +----------+--------+-------+----------------+------------+  Subclavian 244              Multiphasic, WNL 113           +----------+--------+-------+----------------+------------+ +---------+--------+--+--------+--+---------+  Vertebral PSV cm/s 79 EDV cm/s 20 Antegrade  +---------+--------+--+--------+--+---------+ Left Carotid Findings: +----------+--------+--------+--------+-----------+----------------------------+             PSV cm/s EDV cm/s Stenosis Describe    Comments                      +----------+--------+--------+--------+-----------+----------------------------+  CCA Prox   148      22                                                          +----------+--------+--------+--------+-----------+----------------------------+  CCA Distal 98       24                                                          +----------+--------+--------+--------+-----------+----------------------------+  ICA Prox   147      39       40-59%   homogeneous velocities may be elevated                                                       due to tortuosity              +----------+--------+--------+--------+-----------+----------------------------+  ICA Distal 82       22                                                          +----------+--------+--------+--------+-----------+----------------------------+  ECA        116      11                                                          +----------+--------+--------+--------+-----------+----------------------------+ +----------+--------+--------+----------------+------------+  Subclavian PSV cm/s EDV cm/s Describe         Arm Pressure  +----------+--------+--------+----------------+------------+             217               Multiphasic, WNL 119           +----------+--------+--------+----------------+------------+ +---------+--------+--+--------+-+---------+  Vertebral PSV cm/s 42 EDV cm/s 9 Antegrade  +---------+--------+--+--------+-+---------+   Right Doppler Findings: +--------+--------+-----+---------+--------+  Site     Pressure Index Doppler   Comments  +--------+--------+-----+---------+--------+  Brachial 113            triphasic           +--------+--------+-----+---------+--------+  Radial                  triphasic           +--------+--------+-----+---------+--------+  Ulnar                   triphasic           +--------+--------+-----+---------+--------+  Left Doppler Findings: +--------+--------+-----+---------+--------+  Site     Pressure Index Doppler   Comments  +--------+--------+-----+---------+--------+  Brachial 119            triphasic           +--------+--------+-----+---------+--------+  Radial                  triphasic           +--------+--------+-----+---------+--------+  Ulnar                   triphasic           +--------+--------+-----+---------+--------+  Summary: Right Carotid: Velocities in the right ICA are consistent with a 1-39% stenosis. Left Carotid: Velocities in the left ICA are consistent with a 40-59% stenosis. ABI: Pedal artery waveforms within normal limits. Right Upper Extremity:  Doppler waveforms decrease >50% with right radial compression. Doppler waveforms decrease >50% with right ulnar compression. Left Upper Extremity: Doppler waveform obliterate with left radial compression. Doppler waveforms remain within normal limits  with left ulnar compression.  Electronically signed by Servando Snare MD on 05/27/2019 at 10:57:39 AM.    Final     Cardiac Studies   05/25/19 Cardiac catheterization:  There is mild left ventricular systolic dysfunction. The left ventricular ejection fraction is 50-55% by visual estimate.  -------------------------------------  SEVERE TWO-VESSEL DISEASE  Ost LAD lesion is 95% stenosed.  Prox LAD lesion is 90% stenosed ->Prox LAD to Mid LAD lesion is 40% stenosed with 40% stenosed side branch in 1st Diag.  Ramus lesion is 70% stenosed.   SUMMARY: SEVERE TWO-VESSEL DISEASE:  -Ostial LAD 95% ulcerated lesion followed by 90% proximal LAD just prior to1st Diag extending beyond the branch.  -70% proximal large branching RAMUS INTERMEDIUS (RI)  Preserved EF of 50 to 55% with anteroapical-apical-inferoapical hypokinesis   RECOMMENDATIONS  Based on the severity and location of her disease, in reviewing with primary cardiologist, we both feel like the safest option is for her to be admitted under telemetry for monitoring until she is able to undergo CABG.  We have consulted CVTS.  ? After discussion with Dr. Quay Burow, he is requesting consideration for possible all arterial conduits   -We will start IV heparin after 8 hours from TR band placement  Initiate IV nitroglycerin infusion for blood pressure and antianginal effect.  We will restart patient's home blood pressure medications (has both carvedilol and metoprolol written for, will use carvedilol for better blood pressure control)  Check 2D echocardiogram.  Other pre-CABG test will be ordered by CVTS.  05/26/19 Echocardiogram:  There is mild left ventricular systolic  dysfunction. The left ventricular ejection fraction is 50-55% by visual estimate.  -------------------------------------  SEVERE TWO-VESSEL DISEASE  Ost LAD lesion is 95% stenosed.  Prox LAD lesion is 90% stenosed ->Prox LAD to Mid LAD lesion is 40% stenosed with 40% stenosed side branch in 1st Diag.  Ramus lesion is 70% stenosed.   SUMMARY: SEVERE TWO-VESSEL DISEASE:  -Ostial LAD 95% ulcerated lesion followed by 90% proximal LAD just prior to1st Diag extending beyond the branch.  -70% proximal large branching RAMUS INTERMEDIUS (RI)  Preserved EF of 50 to 55% with anteroapical-apical-inferoapical hypokinesis   RECOMMENDATIONS  Based on the severity and location of her disease, in reviewing with primary cardiologist, we both feel like the safest option is for her to be admitted under telemetry for monitoring until she is able to undergo CABG.  We have consulted CVTS.  ? After discussion with Dr. Quay Burow, he is requesting consideration for possible all arterial conduits   -We will start IV heparin after 8 hours from TR band placement  Initiate IV nitroglycerin infusion for blood pressure and antianginal effect.  We will restart patient's home blood pressure medications (has both carvedilol and metoprolol written for, will use carvedilol for better blood pressure control)  Check 2D echocardiogram.  Other pre-CABG test will be ordered by CVTS.  Patient Profile     78 y.o. female with a history of HTN and HLD who presented for evaluation of chest pain and referred for outpatient cardiac cath.   Assessment & Plan    1. CAD with unstable angina: Patient was referred to Dr. Gwenlyn Found Nov 4th, 2020 for evaluation of chest pain, CTA was planned however was unable to be done until Dec, she subsequently developed chest pain that was related to exertion and releived with rest. She was noted to have new TWI and septal Qs on her EKG. Admitted for cardiac catherization. Cardiac cath  showed ostail LAD  95%, proximal LAD 90%, ramus 70%, LCx patent, RCA patent. Echocardiogram showed EF 50-55%, apical hypokinesis, and G1DD. CT surgery was consulted and took for CABG. S/p CABG x3 on 05/28/19, LIMA to LAD, SVG to diagnonal, and SVG to ramus intermediate. Overnight her cardiac index and cardiac output dropped according to the Flotrac, given albumin. She reports some mild chest pain around the surgical site. No arrhythmias noted overnight.  -Currently on ASA, lipitor, and carvedilol -CT surgery managing, planning of d/c'ing MT tubes, gentle diuresis and mobilizing -Will follow from a distance  2. HLD: On atorvastatin 80, continue this.   For questions or updates, please contact [facility] Please consult [email or web address] for contact info under Cardiology/STEMI.      Signed, Asencion Noble, MD  05/29/2019, 7:18 AM    I have personally seen and examined this patient with Dr. Sherry Ruffing. I agree with the assessment and plan as outlined above. She is doing well post bypass surgery. BP stable. Off of pressors. Continue ASA, statin and beta blocker.   Lauree Chandler 05/29/2019 9:54 AM

## 2019-05-30 ENCOUNTER — Inpatient Hospital Stay (HOSPITAL_COMMUNITY): Payer: Medicare Other

## 2019-05-30 LAB — BASIC METABOLIC PANEL
Anion gap: 7 (ref 5–15)
BUN: 23 mg/dL (ref 8–23)
CO2: 23 mmol/L (ref 22–32)
Calcium: 8.2 mg/dL — ABNORMAL LOW (ref 8.9–10.3)
Chloride: 102 mmol/L (ref 98–111)
Creatinine, Ser: 1.84 mg/dL — ABNORMAL HIGH (ref 0.44–1.00)
GFR calc Af Amer: 30 mL/min — ABNORMAL LOW (ref >60)
GFR calc non Af Amer: 26 mL/min — ABNORMAL LOW (ref >60)
Glucose, Bld: 110 mg/dL — ABNORMAL HIGH (ref 70–99)
Potassium: 4.4 mmol/L (ref 3.5–5.1)
Sodium: 132 mmol/L — ABNORMAL LOW (ref 135–145)

## 2019-05-30 LAB — CBC
HCT: 28.2 % — ABNORMAL LOW (ref 36.0–46.0)
Hemoglobin: 8.9 g/dL — ABNORMAL LOW (ref 12.0–15.0)
MCH: 25.9 pg — ABNORMAL LOW (ref 26.0–34.0)
MCHC: 31.6 g/dL (ref 30.0–36.0)
MCV: 82 fL (ref 80.0–100.0)
Platelets: 121 10*3/uL — ABNORMAL LOW (ref 150–400)
RBC: 3.44 MIL/uL — ABNORMAL LOW (ref 3.87–5.11)
RDW: 13.8 % (ref 11.5–15.5)
WBC: 12.2 10*3/uL — ABNORMAL HIGH (ref 4.0–10.5)
nRBC: 0 % (ref 0.0–0.2)

## 2019-05-30 LAB — GLUCOSE, CAPILLARY
Glucose-Capillary: 92 mg/dL (ref 70–99)
Glucose-Capillary: 119 mg/dL — ABNORMAL HIGH (ref 70–99)
Glucose-Capillary: 108 mg/dL — ABNORMAL HIGH (ref 70–99)

## 2019-05-30 MED ORDER — SODIUM CHLORIDE 0.9% FLUSH
3.0000 mL | Freq: Two times a day (BID) | INTRAVENOUS | Status: DC
Start: 2019-05-30 — End: 2019-06-02
  Administered 2019-05-30 – 2019-06-01 (×6): 3 mL via INTRAVENOUS

## 2019-05-30 MED ORDER — ENOXAPARIN SODIUM 30 MG/0.3ML ~~LOC~~ SOLN
30.0000 mg | Freq: Every day | SUBCUTANEOUS | Status: DC
  Administered 2019-05-30: 22:00:00 30 mg via SUBCUTANEOUS
  Filled 2019-05-30: qty 0.3

## 2019-05-30 MED ORDER — SODIUM CHLORIDE 0.9% FLUSH: 3.0000 mL | INTRAVENOUS | Status: DC | PRN

## 2019-05-30 MED ORDER — SODIUM CHLORIDE 0.9 % IV SOLN: 250.0000 mL | INTRAVENOUS | Status: DC | PRN

## 2019-05-30 MED ORDER — ~~LOC~~ CARDIAC SURGERY, PATIENT & FAMILY EDUCATION
Freq: Once | Status: AC
  Administered 2019-05-30: 13:00:00

## 2019-05-30 NOTE — Progress Notes (Signed)
Pt arrived to 4e from 2h. Pt oriented to room and staff. Vitals obtained. Telemetry box applied and CCMD notified x2 verifiers. Pt has sternal wound vac. Will continue current plan of care.

## 2019-05-30 NOTE — Progress Notes (Addendum)
Progress Note  Patient Name: Hannah Morrow Date of Encounter: 05/30/2019\ Primary Cardiologist: No primary care provider on file.   Subjective   Ms. Eimers reports that she is doing well today, she reports that she is a little tired, did not get a lot of sleep last night. She reports that she has some chest pain but that the pain medications help.   Inpatient Medications    Scheduled Meds:  acetaminophen  1,000 mg Oral Q6H   Or   acetaminophen (TYLENOL) oral liquid 160 mg/5 mL  1,000 mg Per Tube Q6H   aspirin EC  325 mg Oral Daily   Or   aspirin  324 mg Per Tube Daily   atorvastatin  80 mg Oral q1800   bisacodyl  10 mg Oral Daily   Or   bisacodyl  10 mg Rectal Daily   carvedilol  3.125 mg Oral BID WC   Chlorhexidine Gluconate Cloth  6 each Topical Daily   [place], Patient & Family Education   Does not apply Once   docusate sodium  200 mg Oral Daily   enoxaparin (LOVENOX) injection  40 mg Subcutaneous QHS   levothyroxine  112 mcg Oral Q0600   pantoprazole  40 mg Oral Daily   sodium chloride flush  3 mL Intravenous Q12H   sodium chloride flush  3 mL Intravenous Q12H   Continuous Infusions:  sodium chloride Stopped (05/29/19 0802)   sodium chloride     sodium chloride     sodium chloride     dexmedetomidine (PRECEDEX) IV infusion Stopped (05/28/19 1523)   lactated ringers     lactated ringers Stopped (05/29/19 0751)   lactated ringers     PRN Meds: sodium chloride, sodium chloride, fentaNYL (SUBLIMAZE) injection, lactated ringers, midazolam, ondansetron (ZOFRAN) IV, sodium chloride flush, sodium chloride flush, traMADol   Vital Signs    Vitals:   05/30/19 0300 05/30/19 0400 05/30/19 0500 05/30/19 0600  BP: (!) 92/48 (!) 94/49 (!) 109/50 (!) 104/53  Pulse: 70 67 72 77  Resp: 10 10 10 13   Temp:  98.5 F (36.9 C)    TempSrc:  Oral    SpO2: 96% 96% 96% 95%  Weight:   90.1 kg   Height:         Intake/Output Summary (Last 24 hours) at 05/30/2019 0739 Last data filed at 05/30/2019 0600 Gross per 24 hour  Intake 57.76 ml  Output 940 ml  Net -882.24 ml   Filed Weights   05/28/19 0441 05/29/19 0500 05/30/19 0500  Weight: 86.3 kg 90.9 kg 90.1 kg    Telemetry    NSR, HR 80s- Personally Reviewed  ECG    None today  Physical Exam   GEN: No acute distress.   Neck: No JVD Cardiac: RRR, no murmurs, rubs, or gallops.  Respiratory: Clear to auscultation bilaterally. GI: Soft, nontender, non-distended  MS: No edema; No deformity. Chest tube in place. Substernal chest wound packing in place.  Neuro:  Nonfocal  Psych: Normal affect   Labs    Chemistry Recent Labs  Lab 05/29/19 0243 05/29/19 1847 05/30/19 0236  NA 137 131* 132*  K 4.2 4.4 4.4  CL 107 101 102  CO2 23 22 23   GLUCOSE 110* 151* 110*  BUN 14 19 23   CREATININE 1.13* 1.42* 1.84*  CALCIUM 7.9* 8.2* 8.2*  GFRNONAA 47* 36* 26*  GFRAA 54* 41* 30*  ANIONGAP 7 8 7      Hematology Recent Labs  Lab 05/29/19 0243 05/29/19 1847 05/30/19 0236  WBC 11.2* 14.8* 12.2*  RBC 4.12 3.96 3.44*  HGB 10.4* 10.0* 8.9*  HCT 34.3* 32.6* 28.2*  MCV 83.3 82.3 82.0  MCH 25.2* 25.3* 25.9*  MCHC 30.3 30.7 31.6  RDW 13.7 13.9 13.8  PLT 147* 145* 121*    Cardiac EnzymesNo results for input(s): TROPONINI in the last 168 hours. No results for input(s): TROPIPOC in the last 168 hours.   BNPNo results for input(s): BNP, PROBNP in the last 168 hours.   DDimer No results for input(s): DDIMER in the last 168 hours.   Radiology    Dg Chest Port 1 View  Result Date: 05/29/2019 CLINICAL DATA:  78 year old female postoperative day 1 status post CABG. EXAM: PORTABLE CHEST 1 VIEW COMPARISON:  05/28/2019 and earlier. FINDINGS: Portable AP semi upright view at 0554 hours. Extubated and enteric tube removed. Right IJ introducer sheath, left chest tube and mediastinal tube remain. Mildly lower lung volumes. Stable cardiac size  and mediastinal contours. No pneumothorax identified. No pulmonary edema. Stable perihilar atelectasis greater on the left. Negative visible bowel gas pattern. IMPRESSION: 1. Extubated and enteric tube removed. Otherwise stable lines and tubes. 2. Mildly lower lung volumes. Stable atelectasis, no pneumothorax or pulmonary edema. Electronically Signed   By: Genevie Ann M.D.   On: 05/29/2019 08:50   Dg Chest Port 1 View  Result Date: 05/28/2019 CLINICAL DATA:  Status post coronary bypass graft. EXAM: PORTABLE CHEST 1 VIEW COMPARISON:  May 26, 2019. FINDINGS: Stable cardiomediastinal silhouette. Endotracheal and nasogastric tubes are in grossly good position. Left-sided chest tube is noted without pneumothorax. Lungs are clear. Bony thorax is unremarkable. IMPRESSION: Endotracheal and nasogastric tubes are in grossly good position. Left-sided chest tube is noted without pneumothorax. No acute cardiopulmonary abnormality is noted. Electronically Signed   By: Marijo Conception M.D.   On: 05/28/2019 13:11    Cardiac Studies   05/25/19 Cardiac catheterization:  There is mild left ventricular systolic dysfunction. The left ventricular ejection fraction is 50-55% by visual estimate.  -------------------------------------  SEVERE TWO-VESSEL DISEASE  Ost LAD lesion is 95% stenosed.  Prox LAD lesion is 90% stenosed ->Prox LAD to Mid LAD lesion is 40% stenosed with 40% stenosed side branch in 1st Diag.  Ramus lesion is 70% stenosed.   SUMMARY: SEVERE TWO-VESSEL DISEASE:  -Ostial LAD 95% ulcerated lesion followed by 90% proximal LAD just prior to1st Diag extending beyond the branch.  -70% proximal large branching RAMUS INTERMEDIUS (RI)  Preserved EF of 50 to 55% with anteroapical-apical-inferoapical hypokinesis   RECOMMENDATIONS  Based on the severity and location of her disease, in reviewing with primary cardiologist, we both feel like the safest option is for her to be admitted under  telemetry for monitoring until she is able to undergo CABG.  We have consulted CVTS.  ? After discussion with Dr. Quay Burow, he is requesting consideration for possible all arterial conduits   -We will start IV heparin after 8 hours from TR band placement  Initiate IV nitroglycerin infusion for blood pressure and antianginal effect.  We will restart patient's home blood pressure medications (has both carvedilol and metoprolol written for, will use carvedilol for better blood pressure control)  Check 2D echocardiogram.  Other pre-CABG test will be ordered by CVTS.  05/26/19 Echocardiogram:  There is mild left ventricular systolic dysfunction. The left ventricular ejection fraction is 50-55% by visual estimate.  -------------------------------------  SEVERE TWO-VESSEL DISEASE  Ost LAD lesion is 95% stenosed.  Prox LAD lesion is 90% stenosed ->Prox LAD to Mid LAD lesion is 40% stenosed with 40% stenosed side branch in 1st Diag.  Ramus lesion is 70% stenosed.   SUMMARY: SEVERE TWO-VESSEL DISEASE:  -Ostial LAD 95% ulcerated lesion followed by 90% proximal LAD just prior to1st Diag extending beyond the branch.  -70% proximal large branching RAMUS INTERMEDIUS (RI)  Preserved EF of 50 to 55% with anteroapical-apical-inferoapical hypokinesis   RECOMMENDATIONS  Based on the severity and location of her disease, in reviewing with primary cardiologist, we both feel like the safest option is for her to be admitted under telemetry for monitoring until she is able to undergo CABG.  We have consulted CVTS.  ? After discussion with Dr. Quay Burow, he is requesting consideration for possible all arterial conduits   -We will start IV heparin after 8 hours from TR band placement  Initiate IV nitroglycerin infusion for blood pressure and antianginal effect.  We will restart patient's home blood pressure medications (has both carvedilol and metoprolol written for, will use carvedilol  for better blood pressure control)  Check 2D echocardiogram.  Other pre-CABG test will be ordered by CVTS.  Patient Profile     78 y.o. female with a history of HTN and HLD who presented for evaluation of chest pain and referred for outpatient cardiac cath.   Assessment & Plan    1. CAD with unstable angina: Patient was referred to Dr. Gwenlyn Found Nov 4th, 2020 for evaluation of chest pain, CTA was planned however was unable to be done until Dec, she subsequently developed chest pain that was related to exertion and releived with rest. She was noted to have new TWI and septal Qs on her EKG. Admitted for cardiac catherization. Cardiac cath showed ostail LAD 95%, proximal LAD 90%, ramus 70%, LCx patent, RCA patent. Echocardiogram showed EF 50-55%, apical hypokinesis, and G1DD. CT surgery was consulted and took for CABG. S/p CABG x3 on 05/28/19, LIMA to LAD, SVG to diagnonal, and SVG to ramus intermediate. She is healing well, was able to walk around the unit yesterday with no issues, reports mild chest pain around surgical site..  -Currently on ASA, lipitor, and carvedilol -CT surgery managing, plan to d/c chest tube today -Will follow from a distance  2. HLD: On atorvastatin 80, continue this.   3. AKI: Cr increased to 1.84 from 1.42 yesterday. She did receive IV lasix, monitoring renal function. Still appears slightly volume overloaded on exam today.   For questions or updates, please contact [facility] Please consult [email or web address] for contact info under Cardiology/STEMI.      Signed, Asencion Noble, MD  05/30/2019, 7:39 AM    I have personally seen and examined this patient with Dr. Sherry Ruffing. I agree with the assessment and plan as outlined above. She is doing well post CABG. BP stable. She is in sinus. Continue ASA, statin and Coreg.   Lauree Chandler 05/30/2019 10:04 AM

## 2019-05-30 NOTE — Progress Notes (Addendum)
[facility] DAILY ICU PROGRESS NOTE                   [address]            [place] [address]          [phone]   2 Days Post-Op Procedure(s) (LRB): CORONARY ARTERY BYPASS GRAFTING (CABG) using the LIMA to LAD; Endoscopic right saphenous vein harvesting to the Ramus and Diag1. (N/A) TRANSESOPHAGEAL ECHOCARDIOGRAM (TEE) (N/A)  Total Length of Stay:  LOS: 5 days   Subjective:  Up in chair, wanting to go back into bed.  States she isn't eat much.  Had some nausea when she walked.  Asking when she can go home.  Educated patient on importance of ambulating at least 3 times per day.  Objective: Vital signs in last 24 hours: Temp:  [98 F (36.7 C)-99.1 F (37.3 C)] 98.5 F (36.9 C) (11/25 0400) Pulse Rate:  [67-88] 77 (11/25 0600) Cardiac Rhythm: Normal sinus rhythm;Heart block (11/25 0400) Resp:  [10-20] 13 (11/25 0600) BP: (74-118)/(36-100) 104/53 (11/25 0600) SpO2:  [60 %-98 %] 95 % (11/25 0600) Arterial Line BP: (90)/(59) 90/59 (11/24 0800) Weight:  [90.1 kg] 90.1 kg (11/25 0500)  Filed Weights   05/28/19 0441 05/29/19 0500 05/30/19 0500  Weight: 86.3 kg 90.9 kg 90.1 kg    Weight change: -0.8 kg   Intake/Output from previous day: 11/24 0701 - 11/25 0700 In: 57.8 [I.V.:57.8] Out: 940 [Urine:840; Chest Tube:100]  Current Meds: Scheduled Meds: . acetaminophen  1,000 mg Oral Q6H   Or  . acetaminophen (TYLENOL) oral liquid 160 mg/5 mL  1,000 mg Per Tube Q6H  . aspirin EC  325 mg Oral Daily   Or  . aspirin  324 mg Per Tube Daily  . atorvastatin  80 mg Oral q1800  . bisacodyl  10 mg Oral Daily   Or  . bisacodyl  10 mg Rectal Daily  . carvedilol  3.125 mg Oral BID WC  . Chlorhexidine Gluconate Cloth  6 each Topical Daily  . [facility], Patient & Family Education   Does not apply Once  . docusate sodium  200 mg Oral Daily  . enoxaparin (LOVENOX) injection  40 mg Subcutaneous QHS  . levothyroxine  112 mcg Oral Q0600  . pantoprazole  40  mg Oral Daily  . sodium chloride flush  3 mL Intravenous Q12H  . sodium chloride flush  3 mL Intravenous Q12H   Continuous Infusions: . sodium chloride Stopped (05/29/19 0802)  . sodium chloride    . sodium chloride    . sodium chloride    . dexmedetomidine (PRECEDEX) IV infusion Stopped (05/28/19 1523)  . lactated ringers    . lactated ringers Stopped (05/29/19 0751)  . lactated ringers     PRN Meds:.sodium chloride, sodium chloride, fentaNYL (SUBLIMAZE) injection, lactated ringers, midazolam, ondansetron (ZOFRAN) IV, sodium chloride flush, sodium chloride flush, traMADol  General appearance: alert, cooperative and no distress Heart: regular rate and rhythm Lungs: clear to auscultation bilaterally Abdomen: soft, non-tender; bowel sounds normal; no masses,  no organomegaly Extremities: edema trace Wound: clean and dry  Lab Results: CBC: Recent Labs    05/29/19 1847 05/30/19 0236  WBC 14.8* 12.2*  HGB 10.0* 8.9*  HCT 32.6* 28.2*  PLT 145* 121*   BMET:  Recent Labs    05/29/19 1847 05/30/19 0236  NA 131* 132*  K 4.4 4.4  CL 101 102  CO2 22 23  GLUCOSE  151* 110*  BUN 19 23  CREATININE 1.42* 1.84*  CALCIUM 8.2* 8.2*    CMET: Lab Results  Component Value Date   WBC 12.2 (H) 05/30/2019   HGB 8.9 (L) 05/30/2019   HCT 28.2 (L) 05/30/2019   PLT 121 (L) 05/30/2019   GLUCOSE 110 (H) 05/30/2019   CHOL 143 05/27/2019   TRIG 141 05/27/2019   HDL 47 05/27/2019   LDLDIRECT 107.0 07/26/2017   LDLCALC 68 05/27/2019   ALT 26 07/28/2018   AST 25 07/28/2018   NA 132 (L) 05/30/2019   K 4.4 05/30/2019   CL 102 05/30/2019   CREATININE 1.84 (H) 05/30/2019   BUN 23 05/30/2019   CO2 23 05/30/2019   TSH 1.15 07/28/2018   INR 1.4 (H) 05/28/2019   HGBA1C 5.5 05/27/2019      PT/INR:  Recent Labs    05/28/19 1245  LABPROT 16.6*  INR 1.4*   Radiology: No results found.   Assessment/Plan: S/P Procedure(s) (LRB): CORONARY ARTERY BYPASS GRAFTING (CABG) using the  LIMA to LAD; Endoscopic right saphenous vein harvesting to the Ramus and Diag1. (N/A) TRANSESOPHAGEAL ECHOCARDIOGRAM (TEE) (N/A)  1. CV- NSR, BP stable- continue low dose Coreg 2. Pulm- d/c chest tubes, CXR w/o pneumothorax, significant effusions, + atelectasis, continue IS 3. Renal- creatinine slightly increased to 1.84, likely due to IV Lasix, yesterday, K is 4.4.Marland Kitchen. hold off on further diuretics today, monitor closely 4. Expected post operative blood loss anemia, stable at 10.4 5. Expected Thrombocytopenia, mild relatively stable on Lovenox for DVT prophylaxis 6. CBGs- controlled, patient is not a diabetic, will d/c SSIP 7. Dispo- patient stable in NSR, creatinine slightly elevated likely due to IV Lasix yesterday monitor, patient needs a lot of encouragement to ambulate, will put PT consult in, possibly transfer to [place], PA-C  05/30/2019 7:38 AM   D/c chest tube today Will need pt To 4e  Monitor cr , mild elevation but not aki level I have seen and examined Hannah Morrow and agree with the above assessment  and plan.  Grace Isaac MD [facility] [phone] Office [phone] 05/30/2019 10:02 AM

## 2019-05-30 NOTE — Evaluation (Signed)
Physical Therapy Evaluation Patient Details Name: Hannah Morrow MRN: [id number] DOB: June 13, 1941 Today's Date: 05/30/2019   History of Present Illness  78 y.o female with unstable angina that underwent CABG x3 on 05/28/2019. PT has PMH significant for HTN, HLD, cervical cancer, and plantar fasciitis.  Clinical Impression  Pt demonstrates deficits in functional mobility, gait, balance, endurance, strength, power, and endurance. Pt requires cues for sternal precaution management, especially during transfers. Pt also requiring the use of an assistive device and ambulating with slowed gait. Pt will benefit from continued acute PT services to reduce falls risk and aide in a return to her PLOF.    Follow Up Recommendations Home health PT;Supervision/Assistance - 24 hour(may progress to no needs)    Equipment Recommendations  None recommended by PT(pt already owns RW)    Recommendations for Other Services       Precautions / Restrictions Precautions Precautions: Fall;Sternal Restrictions Weight Bearing Restrictions: No Other Position/Activity Restrictions: sternal precautions      Mobility  Bed Mobility Overal bed mobility: (pt received up in recliner)                Transfers Overall transfer level: Needs assistance   Transfers: Sit to/from Stand Sit to Stand: Min guard         General transfer comment: PT providing cues for technique and forward lean  Ambulation/Gait Ambulation/Gait assistance: Supervision Gait Distance (Feet): 150 Feet Assistive device: Rolling walker (2 wheeled) Gait Pattern/deviations: Step-through pattern Gait velocity: reduced Gait velocity interpretation: <1.8 ft/sec, indicate of risk for recurrent falls General Gait Details: pt with steady step through gait, relying on UE support for balance  Stairs            Wheelchair Mobility    Modified Rankin (Stroke Patients Only)       Balance Overall balance assessment:  Needs assistance Sitting-balance support: No upper extremity supported;Feet supported Sitting balance-Leahy Scale: Good     Standing balance support: Bilateral upper extremity supported Standing balance-Leahy Scale: Good Standing balance comment: supervision with BUE support of RW                             Pertinent Vitals/Pain Pain Assessment: Faces Faces Pain Scale: Hurts even more Pain Location: chest Pain Descriptors / Indicators: Aching Pain Intervention(s): Limited activity within patient's tolerance    Home Living Family/patient expects to be discharged to:: Private residence Living Arrangements: Spouse/significant other Available Help at Discharge: Family;Available 24 hours/day Type of Home: House Home Access: Stairs to enter Entrance Stairs-Rails: Right Entrance Stairs-Number of Steps: 1 Home Layout: One level Home Equipment: Walker - 2 wheels;Cane - single point;Grab bars - tub/shower      Prior Function Level of Independence: Independent               Hand Dominance   Dominant Hand: Right    Extremity/Trunk Assessment   Upper Extremity Assessment Upper Extremity Assessment: (limited 2/2 sternal precautions, otherwise WFL)    Lower Extremity Assessment Lower Extremity Assessment: Overall WFL for tasks assessed    Cervical / Trunk Assessment Cervical / Trunk Assessment: Normal  Communication   Communication: No difficulties  Cognition Arousal/Alertness: Awake/alert Behavior During Therapy: WFL for tasks assessed/performed Overall Cognitive Status: Within Functional Limits for tasks assessed  General Comments General comments (skin integrity, edema, etc.): VSS on RA, pt reports mild wooziness twice during ambulation but is able to continue ambulation after brief standing rest breaks (noted that pt was recently given fentanyl prior to session    Exercises     Assessment/Plan     PT Assessment Patient needs continued PT services  PT Problem List Decreased activity tolerance;Decreased balance;Decreased mobility;Decreased knowledge of use of DME;Decreased safety awareness;Decreased knowledge of precautions;Cardiopulmonary status limiting activity       PT Treatment Interventions DME instruction;Gait training;Functional mobility training;Stair training;Therapeutic activities;Therapeutic exercise;Balance training;Neuromuscular re-education;Patient/family education    PT Goals (Current goals can be found in the Care Plan section)  Acute Rehab PT Goals Patient Stated Goal: To return to independence PT Goal Formulation: With patient Time For Goal Achievement: 06/13/19 Potential to Achieve Goals: Good    Frequency Min 3X/week   Barriers to discharge        Co-evaluation               AM-PAC PT "6 Clicks" Mobility  Outcome Measure Help needed turning from your back to your side while in a flat bed without using bedrails?: A Lot Help needed moving from lying on your back to sitting on the side of a flat bed without using bedrails?: A Lot Help needed moving to and from a bed to a chair (including a wheelchair)?: A Little Help needed standing up from a chair using your arms (e.g., wheelchair or bedside chair)?: A Little Help needed to walk in hospital room?: None Help needed climbing 3-5 steps with a railing? : A Lot 6 Click Score: 16    End of Session Equipment Utilized During Treatment: (none) Activity Tolerance: Patient tolerated treatment well Patient left: in chair;with call bell/phone within reach Nurse Communication: Mobility status PT Visit Diagnosis: Unsteadiness on feet (R26.81)    Time: [id number] PT Time Calculation (min) (ACUTE ONLY): 23 min   Charges:   PT Evaluation $PT Eval Moderate Complexity: 1 Mod          Zenaida Niece, PT, DPT Acute Rehabilitation Pager: [phone]   Zenaida Niece 05/30/2019, 3:04 PM

## 2019-05-31 ENCOUNTER — Inpatient Hospital Stay (HOSPITAL_COMMUNITY): Payer: Medicare Other

## 2019-05-31 LAB — BASIC METABOLIC PANEL
Anion gap: 9 (ref 5–15)
BUN: 29 mg/dL — ABNORMAL HIGH (ref 8–23)
CO2: 24 mmol/L (ref 22–32)
Calcium: 8.5 mg/dL — ABNORMAL LOW (ref 8.9–10.3)
Chloride: 100 mmol/L (ref 98–111)
Creatinine, Ser: 1.59 mg/dL — ABNORMAL HIGH (ref 0.44–1.00)
GFR calc Af Amer: 36 mL/min — ABNORMAL LOW (ref >60)
GFR calc non Af Amer: 31 mL/min — ABNORMAL LOW (ref >60)
Glucose, Bld: 110 mg/dL — ABNORMAL HIGH (ref 70–99)
Potassium: 4.3 mmol/L (ref 3.5–5.1)
Sodium: 133 mmol/L — ABNORMAL LOW (ref 135–145)

## 2019-05-31 LAB — CBC
HCT: 27.9 % — ABNORMAL LOW (ref 36.0–46.0)
Hemoglobin: 8.7 g/dL — ABNORMAL LOW (ref 12.0–15.0)
MCH: 25.4 pg — ABNORMAL LOW (ref 26.0–34.0)
MCHC: 31.2 g/dL (ref 30.0–36.0)
MCV: 81.6 fL (ref 80.0–100.0)
Platelets: 148 10*3/uL — ABNORMAL LOW (ref 150–400)
RBC: 3.42 MIL/uL — ABNORMAL LOW (ref 3.87–5.11)
RDW: 13.9 % (ref 11.5–15.5)
WBC: 11.9 10*3/uL — ABNORMAL HIGH (ref 4.0–10.5)
nRBC: 0 % (ref 0.0–0.2)

## 2019-05-31 MED ORDER — ENOXAPARIN SODIUM 40 MG/0.4ML ~~LOC~~ SOLN
40.0000 mg | Freq: Every day | SUBCUTANEOUS | Status: DC
  Administered 2019-05-31 – 2019-06-01 (×2): 40 mg via SUBCUTANEOUS
  Filled 2019-05-31 (×2): qty 0.4

## 2019-05-31 NOTE — Progress Notes (Signed)
Progress Note  Patient Name: Hannah Morrow Date of Encounter: 05/31/2019\ Primary Cardiologist:  Gwenlyn Found  Subjective   No chest pain. No events overnight.   Inpatient Medications    Scheduled Meds: . acetaminophen  1,000 mg Oral Q6H   Or  . acetaminophen (TYLENOL) oral liquid 160 mg/5 mL  1,000 mg Per Tube Q6H  . aspirin EC  325 mg Oral Daily   Or  . aspirin  324 mg Per Tube Daily  . atorvastatin  80 mg Oral q1800  . bisacodyl  10 mg Oral Daily   Or  . bisacodyl  10 mg Rectal Daily  . carvedilol  3.125 mg Oral BID WC  . Chlorhexidine Gluconate Cloth  6 each Topical Daily  . docusate sodium  200 mg Oral Daily  . enoxaparin (LOVENOX) injection  30 mg Subcutaneous QHS  . levothyroxine  112 mcg Oral Q0600  . pantoprazole  40 mg Oral Daily  . sodium chloride flush  3 mL Intravenous Q12H  . sodium chloride flush  3 mL Intravenous Q12H   Continuous Infusions: . sodium chloride Stopped (05/29/19 0802)  . sodium chloride    . sodium chloride    . sodium chloride    . lactated ringers    . lactated ringers Stopped (05/29/19 0751)  . lactated ringers     PRN Meds: sodium chloride, sodium chloride, fentaNYL (SUBLIMAZE) injection, lactated ringers, ondansetron (ZOFRAN) IV, sodium chloride flush, sodium chloride flush, traMADol   Vital Signs    Vitals:   05/30/19 1958 05/30/19 2300 05/31/19 0249 05/31/19 0400  BP: 122/61 118/66  122/62  Pulse: 82 81  80  Resp: 18 14  14   Temp: 98.2 F (36.8 C) 98.5 F (36.9 C)  98.7 F (37.1 C)  TempSrc: Oral Oral  Oral  SpO2: 94% 90%  98%  Weight:   91.8 kg   Height:        Intake/Output Summary (Last 24 hours) at 05/31/2019 0804 Last data filed at 05/31/2019 0300 Gross per 24 hour  Intake 230 ml  Output 750 ml  Net -520 ml   Filed Weights   05/29/19 0500 05/30/19 0500 05/31/19 0249  Weight: 90.9 kg 90.1 kg 91.8 kg    Telemetry    Sinus- Personally Reviewed  ECG    No EKG today  Physical Exam     General: Well developed, well nourished, NAD  HEENT: OP clear, mucus membranes moist  SKIN: warm, dry. No rashes. Neuro: No focal deficits  Musculoskeletal: Muscle strength 5/5 all ext  Psychiatric: Mood and affect normal  Neck: No JVD, no carotid bruits, no thyromegaly, no lymphadenopathy.  Lungs:Clear bilaterally, no wheezes, rhonci, crackles Cardiovascular: Regular rate and rhythm. No murmurs, gallops or rubs. Abdomen:Soft. Bowel sounds present. Non-tender.  Extremities: No lower extremity edema. Pulses are 2 + in the bilateral DP/PT.   Labs    Chemistry Recent Labs  Lab 05/29/19 1847 05/30/19 0236 05/31/19 0516  NA 131* 132* 133*  K 4.4 4.4 4.3  CL 101 102 100  CO2 22 23 24   GLUCOSE 151* 110* 110*  BUN 19 23 29*  CREATININE 1.42* 1.84* 1.59*  CALCIUM 8.2* 8.2* 8.5*  GFRNONAA 36* 26* 31*  GFRAA 41* 30* 36*  ANIONGAP 8 7 9      Hematology Recent Labs  Lab 05/29/19 1847 05/30/19 0236 05/31/19 0516  WBC 14.8* 12.2* 11.9*  RBC 3.96 3.44* 3.42*  HGB 10.0* 8.9* 8.7*  HCT 32.6* 28.2* 27.9*  MCV 82.3  82.0 81.6  MCH 25.3* 25.9* 25.4*  MCHC 30.7 31.6 31.2  RDW 13.9 13.8 13.9  PLT 145* 121* 148*    Cardiac EnzymesNo results for input(s): TROPONINI in the last 168 hours. No results for input(s): TROPIPOC in the last 168 hours.   BNPNo results for input(s): BNP, PROBNP in the last 168 hours.   DDimer No results for input(s): DDIMER in the last 168 hours.   Radiology    Dg Chest 2 View  Result Date: 05/31/2019 CLINICAL DATA:  Three days postop open heart surgery EXAM: CHEST - 2 VIEW COMPARISON:  Most recently radiograph 05/30/2019 FINDINGS: Interval removal of a left pleural drain. Postsurgical changes related to prior CABG including intact and aligned sternotomy wires and multiple surgical clips projecting over the mediastinum. Few linear radiodensities in the upper abdomen likely reflect abandoned epicardial pacer wires. Few residual streaky opacities likely  reflect some mild atelectasis. Overall lung volumes appear improved. No pneumothorax. At most trace left effusion. Cardiomediastinal contours are similar to prior with a calcified aorta. No acute osseous or soft tissue abnormality. Degenerative changes are present in the imaged spine and shoulders. IMPRESSION: 1. Interval removal of left pleural drain. No pneumothorax. At most trace left effusion. 2. Mild residual streaky opacities in the lung bases, likely atelectasis. Electronically Signed   By: Lovena Le M.D.   On: 05/31/2019 06:57   Dg Chest Port 1 View  Result Date: 05/30/2019 CLINICAL DATA:  Sore chest status post CABG. EXAM: PORTABLE CHEST 1 VIEW COMPARISON:  05/29/2019. FINDINGS: Left chest tube in stable position. Interval removal of mediastinal drainage catheter. No pneumothorax. Prior CABG. Cardiomegaly. No pulmonary venous congestion. Mild bibasilar subsegmental atelectasis. No pleural effusion or pneumothorax. IMPRESSION: 1. Left chest tube in stable position. Interval removal of mediastinal drainage catheter. No pneumothorax. 2.  Prior CABG.  Stable cardiomegaly. 3. Low lung volumes with persistent bibasilar atelectasis. No interim change from prior exam. Electronically Signed   By: [place]   On: 05/30/2019 07:49    Cardiac Studies   05/25/19 Cardiac catheterization:  There is mild left ventricular systolic dysfunction. The left ventricular ejection fraction is 50-55% by visual estimate.  -------------------------------------  SEVERE TWO-VESSEL DISEASE  Ost LAD lesion is 95% stenosed.  Prox LAD lesion is 90% stenosed ->Prox LAD to Mid LAD lesion is 40% stenosed with 40% stenosed side branch in 1st Diag.  Ramus lesion is 70% stenosed.   SUMMARY: SEVERE TWO-VESSEL DISEASE:  -Ostial LAD 95% ulcerated lesion followed by 90% proximal LAD just prior to1st Diag extending beyond the branch.  -70% proximal large branching RAMUS INTERMEDIUS (RI)  Preserved EF of 50 to  55% with anteroapical-apical-inferoapical hypokinesis   RECOMMENDATIONS  Based on the severity and location of her disease, in reviewing with primary cardiologist, we both feel like the safest option is for her to be admitted under telemetry for monitoring until she is able to undergo CABG.  We have consulted CVTS.  ? After discussion with Dr. Quay Burow, he is requesting consideration for possible all arterial conduits   -We will start IV heparin after 8 hours from TR band placement  Initiate IV nitroglycerin infusion for blood pressure and antianginal effect.  We will restart patient's home blood pressure medications (has both carvedilol and metoprolol written for, will use carvedilol for better blood pressure control)  Check 2D echocardiogram.  Other pre-CABG test will be ordered by CVTS.  05/26/19 Echocardiogram:  There is mild left ventricular systolic dysfunction. The left ventricular ejection  fraction is 50-55% by visual estimate.  -------------------------------------  SEVERE TWO-VESSEL DISEASE  Ost LAD lesion is 95% stenosed.  Prox LAD lesion is 90% stenosed ->Prox LAD to Mid LAD lesion is 40% stenosed with 40% stenosed side branch in 1st Diag.  Ramus lesion is 70% stenosed.   SUMMARY: SEVERE TWO-VESSEL DISEASE:  -Ostial LAD 95% ulcerated lesion followed by 90% proximal LAD just prior to1st Diag extending beyond the branch.  -70% proximal large branching RAMUS INTERMEDIUS (RI)  Preserved EF of 50 to 55% with anteroapical-apical-inferoapical hypokinesis   RECOMMENDATIONS  Based on the severity and location of her disease, in reviewing with primary cardiologist, we both feel like the safest option is for her to be admitted under telemetry for monitoring until she is able to undergo CABG.  We have consulted CVTS.  ? After discussion with Dr. Quay Burow, he is requesting consideration for possible all arterial conduits   -We will start IV heparin after 8  hours from TR band placement  Initiate IV nitroglycerin infusion for blood pressure and antianginal effect.  We will restart patient's home blood pressure medications (has both carvedilol and metoprolol written for, will use carvedilol for better blood pressure control)  Check 2D echocardiogram.  Other pre-CABG test will be ordered by CVTS.  Patient Profile     78 y.o. female with a history of HTN and HLD who presented for evaluation of chest pain and was found to have severe double vessel CAD. She is now s/p CABG.    Assessment & Plan    1. CAD with unstable angina: Doing well post CABG. Continue ASA, statin and beta blocker.We will make sure she has cardiac follow up.   For questions or updates, please contact [place] Please consult [email or web address] for contact info under Cardiology/STEMI.      Signed, Lauree Chandler, MD  05/31/2019, 8:04 AM

## 2019-05-31 NOTE — Progress Notes (Signed)
PT Cancellation Note  Patient Details Name: Hannah Morrow MRN: [id number] DOB: Sep 27, 1940   Cancelled Treatment:    Reason Eval/Treat Not Completed: Patient declined, no reason specified. PT declining PT intervention, reporting that she recently ambulated in hallway with RN assistance and has been ambulating to and from bathroom multiple times per day. PT will attempt to follow up per PT POC.   Zenaida Niece 05/31/2019, 4:39 PM

## 2019-05-31 NOTE — Plan of Care (Signed)

## 2019-05-31 NOTE — Progress Notes (Addendum)
      [address]       [place] [address]             [phone]      3 Days Post-Op Procedure(s) (LRB): CORONARY ARTERY BYPASS GRAFTING (CABG) using the LIMA to LAD; Endoscopic right saphenous vein harvesting to the Ramus and Diag1. (N/A) TRANSESOPHAGEAL ECHOCARDIOGRAM (TEE) (N/A) Subjective: Only complaint is that the chair is hard and she wants to get back to bed  Objective: Vital signs in last 24 hours: Temp:  [98 F (36.7 C)-98.7 F (37.1 C)] 98.7 F (37.1 C) (11/26 0400) Pulse Rate:  [72-84] 80 (11/26 0400) Cardiac Rhythm: Normal sinus rhythm (11/26 0355) Resp:  [11-18] 14 (11/26 0400) BP: (103-139)/(52-66) 122/62 (11/26 0400) SpO2:  [88 %-98 %] 98 % (11/26 0400) Weight:  [91.8 kg] 91.8 kg (11/26 0249)     Intake/Output from previous day: 11/25 0701 - 11/26 0700 In: 230 [P.O.:230] Out: 750 [Urine:750] Intake/Output this shift: No intake/output data recorded.  General appearance: alert, cooperative and no distress Heart: regular rate and rhythm, S1, S2 normal, no murmur, click, rub or gallop Lungs: clear to auscultation bilaterally Abdomen: soft, non-tender; bowel sounds normal; no masses,  no organomegaly Extremities: extremities normal, atraumatic, no cyanosis or edema Wound: prevena covering wound-no drainage  Lab Results: Recent Labs    05/30/19 0236 05/31/19 0516  WBC 12.2* 11.9*  HGB 8.9* 8.7*  HCT 28.2* 27.9*  PLT 121* 148*   BMET:  Recent Labs    05/30/19 0236 05/31/19 0516  NA 132* 133*  K 4.4 4.3  CL 102 100  CO2 23 24  GLUCOSE 110* 110*  BUN 23 29*  CREATININE 1.84* 1.59*  CALCIUM 8.2* 8.5*    PT/INR:  Recent Labs    05/28/19 1245  LABPROT 16.6*  INR 1.4*   ABG    Component Value Date/Time   PHART 7.328 (L) 05/28/2019 1754   HCO3 21.9 05/28/2019 1754   TCO2 23 05/28/2019 1754   ACIDBASEDEF 4.0 (H) 05/28/2019 1754   O2SAT 99.0 05/28/2019 1754   CBG (last 3)  Recent Labs    05/30/19 0417 05/30/19  0758 05/30/19 1133  GLUCAP 92 119* 108*    Assessment/Plan: S/P Procedure(s) (LRB): CORONARY ARTERY BYPASS GRAFTING (CABG) using the LIMA to LAD; Endoscopic right saphenous vein harvesting to the Ramus and Diag1. (N/A) TRANSESOPHAGEAL ECHOCARDIOGRAM (TEE) (N/A)  1. CV- NSR, BP stable- continue low dose Coreg, ASA, statin.  2. Pulm- Chest tube removed. CXR showed no pneumothorax, trace left effusion, atelectasis in the lung bases.  3. Renal- creatinine decreased to 1.59, continue to hold diuretics 4. Expected post operative blood loss anemia, stable at 8.7/27.9 5. Expected Thrombocytopenia, mild relatively stable on Lovenox for DVT prophylaxis 6. CBGs- controlled, patient is not a diabetic, will d/c SSIP   Plan: Continue Prevena-no drainage. Ambulate in the halls today. Encouraged Incentive spirometer. Working on a bowel movement ++ flatus.    LOS: 6 days    Elgie Collard 05/31/2019  Agree with above Doing well Continue physical therapy dispo planning [place]

## 2019-06-01 MED ORDER — DIPHENHYDRAMINE HCL 25 MG PO CAPS
25.0000 mg | ORAL_CAPSULE | Freq: Every evening | ORAL | Status: DC | PRN
  Administered 2019-06-01: 21:00:00 25 mg via ORAL
  Filled 2019-06-01: qty 1

## 2019-06-01 MED ORDER — CARVEDILOL 6.25 MG PO TABS
6.2500 mg | ORAL_TABLET | Freq: Two times a day (BID) | ORAL | Status: DC
  Administered 2019-06-01 – 2019-06-02 (×3): 6.25 mg via ORAL
  Filled 2019-06-01 (×2): qty 1

## 2019-06-01 MED FILL — Mannitol IV Soln 20%: INTRAVENOUS | Qty: 500 | Status: AC

## 2019-06-01 MED FILL — Heparin Sodium (Porcine) Inj 1000 Unit/ML: INTRAMUSCULAR | Qty: 20 | Status: AC

## 2019-06-01 MED FILL — Magnesium Sulfate Inj 50%: INTRAMUSCULAR | Qty: 10 | Status: AC

## 2019-06-01 MED FILL — Heparin Sodium (Porcine) Inj 1000 Unit/ML: INTRAMUSCULAR | Qty: 30 | Status: AC

## 2019-06-01 MED FILL — Potassium Chloride Inj 2 mEq/ML: INTRAVENOUS | Qty: 40 | Status: AC

## 2019-06-01 MED FILL — Sodium Chloride IV Soln 0.9%: INTRAVENOUS | Qty: 3000 | Status: AC

## 2019-06-01 MED FILL — Electrolyte-R (PH 7.4) Solution: INTRAVENOUS | Qty: 4000 | Status: AC

## 2019-06-01 MED FILL — Lidocaine HCl Local Soln Prefilled Syringe 100 MG/5ML (2%): INTRAMUSCULAR | Qty: 5 | Status: AC

## 2019-06-01 MED FILL — Sodium Bicarbonate IV Soln 8.4%: INTRAVENOUS | Qty: 50 | Status: AC

## 2019-06-01 NOTE — Progress Notes (Signed)
Physical Therapy Treatment Patient Details Name: Hannah Morrow MRN: [id number] DOB: 01/09/1941 Today's Date: 06/01/2019    History of Present Illness Pt is a 78 y.o female with unstable angina that underwent CABG x3 on 05/28/2019. PT has PMH significant for HTN, HLD, cervical cancer, and plantar fasciitis.    PT Comments    Pt making steady progress with functional mobility. All VSS throughout. PT will continue to follow pt acutely to progress mobility as tolerated. Discussed with pt to ambulate at least 1-2 more times today with staff assistance. Pt agreeable to plan. Pt would continue to benefit from skilled physical therapy services at this time while admitted and after d/c to address the below listed limitations in order to improve overall safety and independence with functional mobility.   Follow Up Recommendations  Home health PT;Supervision - Intermittent     Equipment Recommendations  None recommended by PT    Recommendations for Other Services       Precautions / Restrictions Precautions Precautions: Fall;Sternal Precaution Comments: wound VAC to chest Restrictions Weight Bearing Restrictions: Yes Other Position/Activity Restrictions: sternal precautions    Mobility  Bed Mobility Overal bed mobility: Needs Assistance Bed Mobility: Rolling;Sidelying to Sit Rolling: Supervision Sidelying to sit: Supervision       General bed mobility comments: cueing to adhere to sternal precautions, supervision for safety  Transfers Overall transfer level: Needs assistance Equipment used: None;Rolling walker (2 wheeled) Transfers: Sit to/from Stand Sit to Stand: Supervision         General transfer comment: pt stood from EOB with RW and supervision, cueing to adhere to sternal precautions; pt without use of RW to stand from toilet  Ambulation/Gait Ambulation/Gait assistance: Supervision Gait Distance (Feet): 250 Feet Assistive device: Rolling walker (2  wheeled) Gait Pattern/deviations: Step-through pattern Gait velocity: decreased   General Gait Details: pt overall steady with use of RW, no LOB or need for physical assistance, supervision for safety   Stairs             Wheelchair Mobility    Modified Rankin (Stroke Patients Only)       Balance Overall balance assessment: Needs assistance Sitting-balance support: No upper extremity supported;Feet supported Sitting balance-Leahy Scale: Good     Standing balance support: During functional activity;No upper extremity supported Standing balance-Leahy Scale: Fair                              Cognition Arousal/Alertness: Awake/alert Behavior During Therapy: WFL for tasks assessed/performed Overall Cognitive Status: Within Functional Limits for tasks assessed                                        Exercises      General Comments        Pertinent Vitals/Pain Pain Assessment: No/denies pain    Home Living                      Prior Function            PT Goals (current goals can now be found in the care plan section) Acute Rehab PT Goals PT Goal Formulation: With patient Time For Goal Achievement: 06/13/19 Potential to Achieve Goals: Good Progress towards PT goals: Progressing toward goals    Frequency    Min 3X/week      PT Plan  Current plan remains appropriate    Co-evaluation              AM-PAC PT "6 Clicks" Mobility   Outcome Measure  Help needed turning from your back to your side while in a flat bed without using bedrails?: A Little Help needed moving from lying on your back to sitting on the side of a flat bed without using bedrails?: A Little Help needed moving to and from a bed to a chair (including a wheelchair)?: None Help needed standing up from a chair using your arms (e.g., wheelchair or bedside chair)?: None Help needed to walk in hospital room?: None Help needed climbing 3-5 steps  with a railing? : A Lot 6 Click Score: 20    End of Session   Activity Tolerance: Patient tolerated treatment well Patient left: in chair;with call bell/phone within reach;Other (comment)(seated in straight back chair at sink for wash-up) Nurse Communication: Mobility status PT Visit Diagnosis: Unsteadiness on feet (R26.81)     Time: [id number] PT Time Calculation (min) (ACUTE ONLY): 24 min  Charges:  $Gait Training: 8-22 mins $Therapeutic Activity: 8-22 mins                     Anastasio Champion, DPT  Acute Rehabilitation Services Pager [phone] Office [place] 06/01/2019, 12:55 PM

## 2019-06-01 NOTE — Progress Notes (Signed)
      [address]       [place], [place] [address]             [phone]      4 Days Post-Op Procedure(s) (LRB): CORONARY ARTERY BYPASS GRAFTING (CABG) using the LIMA to LAD; Endoscopic right saphenous vein harvesting to the Ramus and Diag1. (N/A) TRANSESOPHAGEAL ECHOCARDIOGRAM (TEE) (N/A)   Subjective:  No new complaints.  Declined PT yesterday, because she had walked with nursing. Encouraged patient to not decline PT sessions.  She had diarrhea yesterday.  Objective: Vital signs in last 24 hours: Temp:  [97.7 F (36.5 C)-99.1 F (37.3 C)] 98.3 F (36.8 C) (11/27 0542) Pulse Rate:  [84-90] 90 (11/27 0542) Cardiac Rhythm: Normal sinus rhythm (11/27 0542) Resp:  [13-18] 16 (11/27 0600) BP: (134-157)/(63-81) 155/75 (11/27 0542) SpO2:  [96 %] 96 % (11/27 0542) Weight:  [88.9 kg] 88.9 kg (11/27 0508)  Intake/Output from previous day: 11/26 0701 - 11/27 0700 In: 360 [P.O.:360] Out: 751 [Urine:750; Stool:1]  General appearance: alert, cooperative and no distress Heart: regular rate and rhythm Lungs: clear to auscultation bilaterally Abdomen: soft, non-tender; bowel sounds normal; no masses,  no organomegaly Extremities: edema trace Wound: clean and dry  Lab Results: Recent Labs    05/30/19 0236 05/31/19 0516  WBC 12.2* 11.9*  HGB 8.9* 8.7*  HCT 28.2* 27.9*  PLT 121* 148*   BMET:  Recent Labs    05/30/19 0236 05/31/19 0516  NA 132* 133*  K 4.4 4.3  CL 102 100  CO2 23 24  GLUCOSE 110* 110*  BUN 23 29*  CREATININE 1.84* 1.59*  CALCIUM 8.2* 8.5*    PT/INR: No results for input(s): LABPROT, INR in the last 72 hours. ABG    Component Value Date/Time   PHART 7.328 (L) 05/28/2019 1754   HCO3 21.9 05/28/2019 1754   TCO2 23 05/28/2019 1754   ACIDBASEDEF 4.0 (H) 05/28/2019 1754   O2SAT 99.0 05/28/2019 1754   CBG (last 3)  Recent Labs    05/30/19 0417 05/30/19 0758 05/30/19 1133  GLUCAP 92 119* 108*    Assessment/Plan: S/P Procedure(s)  (LRB): CORONARY ARTERY BYPASS GRAFTING (CABG) using the LIMA to LAD; Endoscopic right saphenous vein harvesting to the Ramus and Diag1. (N/A) TRANSESOPHAGEAL ECHOCARDIOGRAM (TEE) (N/A)  1. CV- NSR, + HTN_ increase Coreg to home regimen, if remains elevated tomorrow, can restart Lisinopril at discharge if creatinine remains stable 2. Pulm- no acute issues, continue 3. Renal- creatinine improved down to 1.59, weight is trending down, not requiring Lasix 4. Expected post operative blood loss anemia, Hgb stable 5. Deconditioning- needs to continue to ambulate 6. Dispo- patient stable, increase Coreg to home regimen for additional BP support, if creatinine stable can restart ACE tomorrow if remains Hypertensive, will d/c EPW today, possibly for d/c in AM   LOS: 7 days   Ellwood Handler, PA-C 06/01/2019

## 2019-06-01 NOTE — Progress Notes (Addendum)
CARDIAC REHAB PHASE I   1130-1210 Pt education completed with pt and daughter including diet, IS, sternal precautions, exercise guidelines, and wound care.  All questions answered and pt and daughter verbalized understanding.  No IS present in room.  Pt provided with new IS.  Pt not walked d/t recent walk with PT and bathing.  Referral placed to CRP 2 [place].  Noel Christmas, RN 06/01/2019 12:10 PM

## 2019-06-01 NOTE — Care Management Important Message (Signed)
Important Message  Patient Details  Name: Hannah Morrow MRN: [id number] Date of Birth: 11/15/1940   Medicare Important Message Given:  Yes     Shelda Altes 06/01/2019, 11:12 AM

## 2019-06-01 NOTE — Progress Notes (Signed)
Removed epicardial wires per order. 3 intact.  Pt tolerated procedure well.  Pt instructed to remain on bedrest for one hour.  Frequent vitals will be taken and documented. Pt resting with call bell within reach.  

## 2019-06-02 LAB — BASIC METABOLIC PANEL
Anion gap: 10 (ref 5–15)
BUN: 21 mg/dL (ref 8–23)
CO2: 24 mmol/L (ref 22–32)
Calcium: 8.5 mg/dL — ABNORMAL LOW (ref 8.9–10.3)
Chloride: 105 mmol/L (ref 98–111)
Creatinine, Ser: 1.23 mg/dL — ABNORMAL HIGH (ref 0.44–1.00)
GFR calc Af Amer: 49 mL/min — ABNORMAL LOW (ref >60)
GFR calc non Af Amer: 42 mL/min — ABNORMAL LOW (ref >60)
Glucose, Bld: 127 mg/dL — ABNORMAL HIGH (ref 70–99)
Potassium: 3.8 mmol/L (ref 3.5–5.1)
Sodium: 139 mmol/L (ref 135–145)

## 2019-06-02 MED ORDER — LISINOPRIL 10 MG PO TABS
20.0000 mg | ORAL_TABLET | Freq: Every day | ORAL | Status: DC
  Administered 2019-06-02: 20 mg via ORAL
  Filled 2019-06-02: qty 2

## 2019-06-02 MED ORDER — ASPIRIN 325 MG PO TBEC: 325.0000 mg | DELAYED_RELEASE_TABLET | Freq: Every day | ORAL | 0 refills | Status: DC

## 2019-06-02 MED ORDER — TRAMADOL HCL 50 MG PO TABS: 50.0000 mg | ORAL_TABLET | Freq: Four times a day (QID) | ORAL | 0 refills | Status: DC | PRN

## 2019-06-02 MED ORDER — ACETAMINOPHEN 500 MG PO TABS: 1000.0000 mg | ORAL_TABLET | Freq: Four times a day (QID) | ORAL | 0 refills | Status: DC

## 2019-06-02 MED ORDER — ATORVASTATIN CALCIUM 80 MG PO TABS: 80.0000 mg | ORAL_TABLET | Freq: Every day | ORAL | 1 refills | Status: DC

## 2019-06-02 MED ORDER — LISINOPRIL 20 MG PO TABS: 20.0000 mg | ORAL_TABLET | Freq: Every day | ORAL | 1 refills | Status: DC

## 2019-06-02 NOTE — Plan of Care (Signed)

## 2019-06-02 NOTE — Plan of Care (Signed)
Problem: Education: Goal: Knowledge of General Education information will improve Description: Including pain rating scale, medication(s)/side effects and non-pharmacologic comfort measures 06/02/2019 1320 by Glenard Haring, RN Outcome: Adequate for Discharge 06/02/2019 1315 by Glenard Haring, RN Outcome: Progressing   Problem: Health Behavior/Discharge Planning: Goal: Ability to manage health-related needs will improve 06/02/2019 1320 by Glenard Haring, RN Outcome: Adequate for Discharge 06/02/2019 1315 by Glenard Haring, RN Outcome: Progressing   Problem: Clinical Measurements: Goal: Ability to maintain clinical measurements within normal limits will improve 06/02/2019 1320 by Glenard Haring, RN Outcome: Adequate for Discharge 06/02/2019 1315 by Glenard Haring, RN Outcome: Progressing Goal: Will remain free from infection 06/02/2019 1320 by Glenard Haring, RN Outcome: Adequate for Discharge 06/02/2019 1315 by Glenard Haring, RN Outcome: Progressing Goal: Diagnostic test results will improve 06/02/2019 1320 by Glenard Haring, RN Outcome: Adequate for Discharge 06/02/2019 1315 by Glenard Haring, RN Outcome: Progressing Goal: Respiratory complications will improve 06/02/2019 1320 by Glenard Haring, RN Outcome: Adequate for Discharge 06/02/2019 1315 by Glenard Haring, RN Outcome: Progressing Goal: Cardiovascular complication will be avoided 06/02/2019 1320 by Glenard Haring, RN Outcome: Adequate for Discharge 06/02/2019 1315 by Glenard Haring, RN Outcome: Progressing   Problem: Activity: Goal: Risk for activity intolerance will decrease 06/02/2019 1320 by Glenard Haring, RN Outcome: Adequate for Discharge 06/02/2019 1315 by Glenard Haring, RN Outcome: Progressing   Problem: Nutrition: Goal: Adequate nutrition will be maintained 06/02/2019 1320 by Glenard Haring, RN Outcome: Adequate for Discharge 06/02/2019 1315  by Glenard Haring, RN Outcome: Progressing   Problem: Coping: Goal: Level of anxiety will decrease 06/02/2019 1320 by Glenard Haring, RN Outcome: Adequate for Discharge 06/02/2019 1315 by Glenard Haring, RN Outcome: Progressing   Problem: Elimination: Goal: Will not experience complications related to bowel motility 06/02/2019 1320 by Glenard Haring, RN Outcome: Adequate for Discharge 06/02/2019 1315 by Glenard Haring, RN Outcome: Progressing Goal: Will not experience complications related to urinary retention 06/02/2019 1320 by Glenard Haring, RN Outcome: Adequate for Discharge 06/02/2019 1315 by Glenard Haring, RN Outcome: Progressing   Problem: Pain Managment: Goal: General experience of comfort will improve 06/02/2019 1320 by Glenard Haring, RN Outcome: Adequate for Discharge 06/02/2019 1315 by Glenard Haring, RN Outcome: Progressing   Problem: Safety: Goal: Ability to remain free from injury will improve 06/02/2019 1320 by Glenard Haring, RN Outcome: Adequate for Discharge 06/02/2019 1315 by Glenard Haring, RN Outcome: Progressing   Problem: Skin Integrity: Goal: Risk for impaired skin integrity will decrease 06/02/2019 1320 by Glenard Haring, RN Outcome: Adequate for Discharge 06/02/2019 1315 by Glenard Haring, RN Outcome: Progressing   Problem: Education: Goal: Understanding of CV disease, CV risk reduction, and recovery process will improve 06/02/2019 1320 by Glenard Haring, RN Outcome: Adequate for Discharge 06/02/2019 1315 by Glenard Haring, RN Outcome: Progressing Goal: Individualized Educational Video(s) 06/02/2019 1320 by Glenard Haring, RN Outcome: Adequate for Discharge 06/02/2019 1315 by Glenard Haring, RN Outcome: Progressing   Problem: Activity: Goal: Ability to return to baseline activity level will improve 06/02/2019 1320 by Glenard Haring, RN Outcome: Adequate for Discharge 06/02/2019  1315 by Glenard Haring, RN Outcome: Progressing   Problem: Cardiovascular: Goal: Ability to achieve and maintain adequate cardiovascular perfusion will improve 06/02/2019 1320 by Glenard Haring, RN Outcome: Adequate for Discharge 06/02/2019 1315 by Glenard Haring, RN Outcome: Progressing Goal: Vascular access  site(s) Level 0-1 will be maintained 06/02/2019 1320 by Glenard Haring, RN Outcome: Adequate for Discharge 06/02/2019 1315 by Glenard Haring, RN Outcome: Progressing   Problem: Health Behavior/Discharge Planning: Goal: Ability to safely manage health-related needs after discharge will improve 06/02/2019 1320 by Glenard Haring, RN Outcome: Adequate for Discharge 06/02/2019 1315 by Glenard Haring, RN Outcome: Progressing

## 2019-06-02 NOTE — Progress Notes (Signed)
Discharge instructions given to Hannah Morrow and her daughter.  Discussed medications, medication changes and side effects.  Discussed activities and lifting restrictions until follow up with surgeon.  Discussed follow up appointments and signs and symptoms to watch for and when to contact the physician.  Verbalized understanding.

## 2019-06-02 NOTE — Progress Notes (Signed)
CARDIAC REHAB PHASE I   Reviewed d/c information with pt. Encouraged continued IS use, walks, and sternal precautions. Pt states she has a walker at home if needed. Encouraged monitoring incision daily. Pt referred to [facility] [place] Rufina Falco, RN BSN 06/02/2019 9:11 AM

## 2019-06-02 NOTE — Progress Notes (Addendum)
      [address]       [place] [address]             [phone]      5 Days Post-Op Procedure(s) (LRB): CORONARY ARTERY BYPASS GRAFTING (CABG) using the LIMA to LAD; Endoscopic right saphenous vein harvesting to the Ramus and Diag1. (N/A) TRANSESOPHAGEAL ECHOCARDIOGRAM (TEE) (N/A) Subjective: Feels good this morning. Ready to go home.   Objective: Vital signs in last 24 hours: Temp:  [98.1 F (36.7 C)-99.7 F (37.6 C)] 98.8 F (37.1 C) (11/28 0357) Pulse Rate:  [89-97] 95 (11/28 0357) Cardiac Rhythm: Normal sinus rhythm (11/27 1900) Resp:  [15-20] 20 (11/28 0357) BP: (133-164)/(67-98) 155/90 (11/28 0357) SpO2:  [95 %-99 %] 97 % (11/28 0357) Weight:  [87.7 kg] 87.7 kg (11/28 0300)     Intake/Output from previous day: 11/27 0701 - 11/28 0700 In: -  Out: 700 [Urine:700] Intake/Output this shift: No intake/output data recorded.  General appearance: alert, cooperative and no distress Heart: regular rate and rhythm, S1, S2 normal, no murmur, click, rub or gallop Lungs: clear to auscultation bilaterally Abdomen: soft, non-tender; bowel sounds normal; no masses,  no organomegaly Extremities: extremities normal, atraumatic, no cyanosis or edema Wound: clean and dry covered with a prevena dressing  Lab Results: Recent Labs    05/31/19 0516  WBC 11.9*  HGB 8.7*  HCT 27.9*  PLT 148*   BMET:  Recent Labs    05/31/19 0516 06/02/19 0259  NA 133* 139  K 4.3 3.8  CL 100 105  CO2 24 24  GLUCOSE 110* 127*  BUN 29* 21  CREATININE 1.59* 1.23*  CALCIUM 8.5* 8.5*    PT/INR: No results for input(s): LABPROT, INR in the last 72 hours. ABG    Component Value Date/Time   PHART 7.328 (L) 05/28/2019 1754   HCO3 21.9 05/28/2019 1754   TCO2 23 05/28/2019 1754   ACIDBASEDEF 4.0 (H) 05/28/2019 1754   O2SAT 99.0 05/28/2019 1754   CBG (last 3)  Recent Labs    05/30/19 0758 05/30/19 1133  GLUCAP 119* 108*    Assessment/Plan: S/P Procedure(s)  (LRB): CORONARY ARTERY BYPASS GRAFTING (CABG) using the LIMA to LAD; Endoscopic right saphenous vein harvesting to the Ramus and Diag1. (N/A) TRANSESOPHAGEAL ECHOCARDIOGRAM (TEE) (N/A)  1. CV-NSR, BP climbing-Will reorder lisinopril at  A reduced dose. Creatinine continues to trend down. EPW removed 2. Pulm-tolerating room air with good oxygen support. 3. Renal-creatinine 1.23, electrolytes okay 4. Endo-blood sugar well controlled 5. H and H stable at 8.7/27.9  Plan: Discharge today. Martin Majestic will be removed and wound care instructions explained to the patient. Follow-up has been arranged.     LOS: 8 days    Elgie Collard 06/02/2019   Agree with above Home today  [place]

## 2019-06-04 ENCOUNTER — Encounter: Payer: Self-pay | Admitting: Adult Health

## 2019-06-05 ENCOUNTER — Other Ambulatory Visit: Payer: Self-pay | Admitting: Adult Health

## 2019-06-05 ENCOUNTER — Ambulatory Visit (HOSPITAL_COMMUNITY)
Admission: RE | Admit: 2019-06-05 | Discharge: 2019-06-05 | Disposition: A | Discharge status: Home / Self Care | Payer: Medicare Other | Source: Ambulatory Visit | Attending: Cardiovascular Disease | Admitting: Cardiovascular Disease

## 2019-06-05 ENCOUNTER — Encounter (HOSPITAL_COMMUNITY): Payer: Medicare Other

## 2019-06-05 MED ORDER — TRAZODONE HCL 50 MG PO TABS: 25.0000 mg | ORAL_TABLET | Freq: Every evening | ORAL | 0 refills | Status: DC | PRN

## 2019-06-12 ENCOUNTER — Telehealth (INDEPENDENT_AMBULATORY_CARE_PROVIDER_SITE_OTHER): Payer: Medicare Other | Admitting: Adult Health

## 2019-06-12 ENCOUNTER — Telehealth: Payer: Self-pay

## 2019-06-12 ENCOUNTER — Encounter: Payer: Self-pay | Admitting: Adult Health

## 2019-06-12 ENCOUNTER — Other Ambulatory Visit: Payer: Self-pay

## 2019-06-12 DIAGNOSIS — T7840XA Allergy, unspecified, initial encounter: Secondary | ICD-10-CM | POA: Diagnosis not present

## 2019-06-12 DIAGNOSIS — I200 Unstable angina: Secondary | ICD-10-CM

## 2019-06-12 NOTE — Progress Notes (Signed)
Virtual Visit via Video Note  I connected with Hannah Morrow  on 06/12/19 at  1:30 PM EST by a video enabled telemedicine application and verified that I am speaking with the correct person using two identifiers.  Location patient: home Location provider:work or home office Persons participating in the virtual visit: patient, provider  I discussed the limitations of evaluation and management by telemedicine and the availability of in person appointments. The patient expressed understanding and agreed to proceed.   HPI: 78 year old female who is being evaluated today for an acute issue for rash.  She underwent cardiac cath on 05/25/2019.  This showed a preserved EF and two-vessel CAD.  She underwent CABG x3 as well as endoscopic harvest of greater saphenous vein from the right leg.  He was prescribed tramadol postop.  Soon after taking tramadol for a couple of days she developed a full-body rash that has been itching.  She stopped tramadol 2 days ago and the rash has slowly started to dissipate. Se has been taking Benadryl to help with the itching.  He denies any incisional pain, chest pain, or shortness of breath.   ROS: See pertinent positives and negatives per HPI.  Past Medical History:  Diagnosis Date  . Allergy    no meds  . ANXIETY   . Cancer [facility])    cervical cancer - hysterectomy  . GERD   . Hyperlipidemia   . Hypertension   . Hypothyroid   . Plantar fasciitis    right  . RESTLESS LEGS SYNDROME     Past Surgical History:  Procedure Laterality Date  . ABDOMINAL HYSTERECTOMY    . BREAST SURGERY     Bilateral breast reduction  . COLOSTOMY  04-22-2010   [name]  . cone bx     cervix  . CORONARY ARTERY BYPASS GRAFT N/A 05/28/2019   Procedure: CORONARY ARTERY BYPASS GRAFTING (CABG) using the LIMA to LAD; Endoscopic right saphenous vein harvesting to the Ramus and Diag1.;  Surgeon: Grace Isaac, MD;  Location: [facility];  Service: Open Heart Surgery;  Laterality: N/A;  .  LEFT HEART CATH AND CORONARY ANGIOGRAPHY N/A 05/25/2019   Procedure: LEFT HEART CATH AND CORONARY ANGIOGRAPHY;  Surgeon: Leonie Man, MD;  Location: [facility];  Service: Cardiovascular;  Laterality: N/A;  . REDUCTION MAMMAPLASTY Bilateral   . TEE WITHOUT CARDIOVERSION N/A 05/28/2019   Procedure: TRANSESOPHAGEAL ECHOCARDIOGRAM (TEE);  Surgeon: Grace Isaac, MD;  Location: [facility];  Service: Open Heart Surgery;  Laterality: N/A;  . WISDOM TOOTH EXTRACTION      Family History  Problem Relation Age of Onset  . Cancer Sister        melanoma- with mets  . Colon cancer Father 64  . Stomach cancer Maternal Grandfather   . Breast cancer Mother 10  . Breast cancer Daughter 51  . Rectal cancer Neg Hx   . Esophageal cancer Neg Hx      Current Outpatient Medications:  .  acetaminophen (TYLENOL) 500 MG tablet, Take 2 tablets (1,000 mg total) by mouth every 6 (six) hours., Disp: 30 tablet, Rfl: 0 .  Ascorbic Acid (VITAMIN C) 1000 MG tablet, Take 1,000 mg by mouth daily., Disp: , Rfl:  .  aspirin EC 325 MG EC tablet, Take 1 tablet (325 mg total) by mouth daily., Disp: 30 tablet, Rfl: 0 .  atorvastatin (LIPITOR) 80 MG tablet, Take 1 tablet (80 mg total) by mouth daily at 6 PM., Disp: 30 tablet, Rfl: 1 .  b complex vitamins tablet, Take 1 tablet by mouth daily., Disp: , Rfl:  .  calcium carbonate (TUMS - DOSED IN MG ELEMENTAL CALCIUM) 500 MG chewable tablet, Chew 1 tablet by mouth daily as needed for indigestion or heartburn. , Disp: , Rfl:  .  carvedilol (COREG) 6.25 MG tablet, Take 1 tablet (6.25 mg total) by mouth 2 (two) times daily with a meal., Disp: 180 tablet, Rfl: 1 .  Cholecalciferol (VITAMIN D) 2000 UNITS CAPS, Take by mouth., Disp: , Rfl:  .  diphenhydrAMINE (BENADRYL) 25 MG tablet, Take 25 mg by mouth at bedtime., Disp: , Rfl:  .  fluocinonide ointment (LIDEX) [id number] %, Apply 1 application topically daily as needed (itching)., Disp: , Rfl:  .  LEVOXYL 112 MCG tablet, TAKE 1  TABLET DAILY (Patient taking differently: Take 112 mcg by mouth daily. ), Disp: 90 tablet, Rfl: 3 .  lisinopril (ZESTRIL) 20 MG tablet, Take 1 tablet (20 mg total) by mouth daily., Disp: 30 tablet, Rfl: 1 .  omeprazole (PRILOSEC) 20 MG capsule, TAKE 1 CAPSULE DAILY (Patient taking differently: Take 20 mg by mouth daily. ), Disp: 90 capsule, Rfl: 3 .  traMADol (ULTRAM) 50 MG tablet, Take 1 tablet (50 mg total) by mouth every 6 (six) hours as needed for moderate pain., Disp: 30 tablet, Rfl: 0 .  traZODone (DESYREL) 50 MG tablet, Take 0.5-1 tablets (25-50 mg total) by mouth at bedtime as needed for sleep., Disp: 30 tablet, Rfl: 0  EXAM:  VITALS per patient if applicable:  GENERAL: alert, oriented, appears well and in no acute distress  HEENT: atraumatic, conjunttiva clear, no obvious abnormalities on inspection of external nose and ears  NECK: normal movements of the head and neck  LUNGS: on inspection no signs of respiratory distress, breathing rate appears normal, no obvious gross SOB, gasping or wheezing  CV: no obvious cyanosis  MS: moves all visible extremities without noticeable abnormality  PSYCH/NEURO: pleasant and cooperative, no obvious depression or anxiety, speech and thought processing grossly intact  ASSESSMENT AND PLAN:  Discussed the following assessment and plan:  1. Allergic reaction, initial encounter -Possibly due to tramadol.  Since she is having no incisional pain she was advised to stop this medication.  She can take Claritin with Benadryl to help with the histamine effect.  Rash should completely resolve in a few days. - Follow up as needed     I discussed the assessment and treatment plan with the patient. The patient was provided an opportunity to ask questions and all were answered. The patient agreed with the plan and demonstrated an understanding of the instructions.   The patient was advised to call back or seek an in-person evaluation if the symptoms  worsen or if the condition fails to improve as anticipated.   [name], NP

## 2019-06-12 NOTE — Telephone Encounter (Signed)
-----   Message from Grace Isaac, MD sent at 06/11/2019  4:59 PM EST ----- Regarding: RE: rash Contact: [phone] Maybe tramadol , try not to take it , if  rash continues will need tomsee her medical MD ----- Message ----- From: Marylen Ponto, LPN Sent: [id number]   4:49 PM EST To: Grace Isaac, MD, Elgie Collard, PA-C Subject: rash                                           Patient called today about developing a rash on upper chest/ arm pits and back of neck 2 days ago. The only new medications are Lipitor, tramadol, trazodone and tylenol gel caps. She has been on the Lipitor x1 wk, the tylenol gel caps x3 days,  She has only taken 3 doses of the Tramadol and 1 does of the Trazodone. No shortness of breath or swelling,  only itching, she has taken some benadryl.   Please advise Thanks Linden Dolin

## 2019-06-13 ENCOUNTER — Other Ambulatory Visit: Payer: Self-pay

## 2019-06-13 ENCOUNTER — Ambulatory Visit (INDEPENDENT_AMBULATORY_CARE_PROVIDER_SITE_OTHER): Payer: Medicare Other | Admitting: Cardiovascular Disease

## 2019-06-13 ENCOUNTER — Encounter: Payer: Self-pay | Admitting: Cardiovascular Disease

## 2019-06-13 VITALS — BP 116/78 | HR 79 | Temp 95.9°F | Ht 62.0 in | Wt 191.0 lb

## 2019-06-13 DIAGNOSIS — R9431 Abnormal electrocardiogram [ECG] [EKG]: Secondary | ICD-10-CM | POA: Diagnosis not present

## 2019-06-13 DIAGNOSIS — I6522 Occlusion and stenosis of left carotid artery: Secondary | ICD-10-CM

## 2019-06-13 DIAGNOSIS — I10 Essential (primary) hypertension: Secondary | ICD-10-CM

## 2019-06-13 DIAGNOSIS — R072 Precordial pain: Secondary | ICD-10-CM | POA: Diagnosis not present

## 2019-06-13 DIAGNOSIS — I200 Unstable angina: Secondary | ICD-10-CM | POA: Diagnosis not present

## 2019-06-13 DIAGNOSIS — R0602 Shortness of breath: Secondary | ICD-10-CM

## 2019-06-13 DIAGNOSIS — Z951 Presence of aortocoronary bypass graft: Secondary | ICD-10-CM | POA: Diagnosis not present

## 2019-06-13 DIAGNOSIS — I779 Disorder of arteries and arterioles, unspecified: Secondary | ICD-10-CM | POA: Insufficient documentation

## 2019-06-13 DIAGNOSIS — R079 Chest pain, unspecified: Secondary | ICD-10-CM | POA: Diagnosis not present

## 2019-06-13 NOTE — Assessment & Plan Note (Signed)
History of CAD status post cardiac catheterization performed by Dr. Ellyn Hack 05/25/2019 revealing severe two-vessel disease requiring CABG.  Her EF was normal.  Dr. Servando Snare performed coronary artery bypass grafting times 311/23/20 with a LIMA to the LAD, vein to a ramus branch and to a diagonal branch.  Her postop course was unremarkable.  She was discharged home on 05/25/2019.  Her major complaints are of fatigue and poor sleeping pattern.

## 2019-06-13 NOTE — Assessment & Plan Note (Signed)
Preop carotid Doppler showed moderate left ICA stenosis.  Carotid Dopplers will be repeated on an annual basis.

## 2019-06-13 NOTE — Assessment & Plan Note (Signed)
History of essential hypertension blood pressure measured today 116/78.  She is on carvedilol and lisinopril.

## 2019-06-13 NOTE — Assessment & Plan Note (Signed)
History of dyslipidemia on high-dose statin therapy lipid profile performed 05/27/2019 revealing total cholesterol 143, LDL 68 HDL 47

## 2019-06-13 NOTE — Patient Instructions (Signed)
Medication Instructions:  Your physician recommends that you continue on your current medications as directed. Please refer to the Current Medication list given to you today.  If you need a refill on your cardiac medications before your next appointment, please call your pharmacy.   Lab work: NONE  Testing/Procedures: Your physician has requested that you have a carotid duplex in 1 year. This test is an ultrasound of the carotid arteries in your neck. It looks at blood flow through these arteries that supply the brain with blood. Allow one hour for this exam. There are no restrictions or special instructions.   Follow-Up: At [facility], you and your health needs are our priority.  As part of our continuing mission to provide you with exceptional heart care, we have created designated Provider Care Teams.  These Care Teams include your primary Cardiologist (physician) and Advanced Practice Providers (APPs -  Physician Assistants and Nurse Practitioners) who all work together to provide you with the care you need, when you need it. You may see Dr Gwenlyn Found or one of the following Advanced Practice Providers on your designated Care Team:    Kerin Ransom, PA-C  [place], [place]  Coletta Memos, [place]  Your physician wants you to follow-up in: 3 months with Kerin Ransom, PA-C Your physician wants you to follow-up in: 6 months with Dr Gwenlyn Found

## 2019-06-13 NOTE — Progress Notes (Signed)
06/13/2019 Hannah Morrow   [birth year]  [id number]  Primary Physician Nafziger, Tommi Rumps, NP Primary Cardiologist: Lorretta Harp MD [name], [name]  HPI:  Hannah Morrow is a 78 y.o.  moderately overweight married Caucasian female mother of 2 daughters, grandmother of 5 grandchildren and great grandmother of a great grandchildren referred by [name] NP for evaluation of chest pain.  Her husband Gene is also a patient of mine.  I last saw her in the office 05/09/2019. 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  Her risk factors include treated hypertension and hyperlipidemia.  There is no family history of heart disease.  She is never had a heart attack or stroke.  She is otherwise healthy.  She does have a history of GERD.  She had chest pain last week lasting 3 days rating to her neck back shoulders and throat relieved with eructation.  She has had no recurrent symptoms.  I had arranged for her to undergo undergo coronary CTA which was not scheduled scheduled until the beginning of December.  She came back in to see Kerin Ransom because of progressive angina on 05/23/2019 with anterior T wave inversion more pronounced than on the prior EKG.  She was admitted for cardiac cath performed by Dr. Ellyn Hack on 05/25/2019 revealing severe two-vessel disease requiring surgical revascularization which was performed 3 days later by Dr. Servando Snare.  She had LIMA to LAD, vein to ramus branch and diagonal branch.  She was discharged home on 28 November and has been recuperating at home with major complaints of fatigue.   Current Meds  Medication Sig  . acetaminophen (TYLENOL) 500 MG tablet Take 2 tablets (1,000 mg total) by mouth every 6 (six) hours.  . Ascorbic Acid (VITAMIN C) 1000 MG tablet Take 1,000 mg by mouth daily.  Marland Kitchen aspirin EC 325 MG EC tablet Take 1 tablet (325 mg total) by mouth daily.  Marland Kitchen atorvastatin (LIPITOR) 80 MG tablet Take 1 tablet (80 mg total) by  mouth daily at 6 PM.  . b complex vitamins tablet Take 1 tablet by mouth daily.  . calcium carbonate (TUMS - DOSED IN MG ELEMENTAL CALCIUM) 500 MG chewable tablet Chew 1 tablet by mouth daily as needed for indigestion or heartburn.   . carvedilol (COREG) 6.25 MG tablet Take 1 tablet (6.25 mg total) by mouth 2 (two) times daily with a meal.  . Cholecalciferol (VITAMIN D) 2000 UNITS CAPS Take by mouth.  . diphenhydrAMINE (BENADRYL) 25 MG tablet Take 25 mg by mouth at bedtime.  . fluocinonide ointment (LIDEX) AB-123456789 % Apply 1 application topically daily as needed (itching).  . LEVOXYL 112 MCG tablet TAKE 1 TABLET DAILY (Patient taking differently: Take 112 mcg by mouth daily. )  . lisinopril (ZESTRIL) 20 MG tablet Take 1 tablet (20 mg total) by mouth daily.  Marland Kitchen omeprazole (PRILOSEC) 20 MG capsule TAKE 1 CAPSULE DAILY (Patient taking differently: Take 20 mg by mouth daily. )     Allergies  Allergen Reactions  . Penicillins Shortness Of Breath    red face Did it involve swelling of the face/tongue/throat, SOB, or low BP? Yes Did it involve sudden or severe rash/hives, skin peeling, or any reaction on the inside of your mouth or nose? Yes Did you need to seek medical attention at a hospital or doctor's office? Yes When did it last happen?40+ years If all above answers are "NO", may proceed with cephalosporin use.   . Amlodipine  Pt states it made her feel "awful and weird."  . Metoprolol Other (See Comments)    " feel weird"  . Betadine [Povidone Iodine] Rash  . Hydrocodone-Acetaminophen Rash    ras  . Tramadol Itching and Rash    Social History   Socioeconomic History  . Marital status: Married    Spouse name: Not on file  . Number of children: 2  . Years of education: Not on file  . Highest education level: Not on file  Occupational History  . Not on file  Social Needs  . Financial resource strain: Not hard at all  . Food insecurity    Worry: Patient refused     Inability: Patient refused  . Transportation needs    Medical: Patient refused    Non-medical: Patient refused  Tobacco Use  . Smoking status: Never Smoker  . Smokeless tobacco: Never Used  Substance and Sexual Activity  . Alcohol use: Yes    Alcohol/week: 5.0 standard drinks    Types: 5 Glasses of wine per week    Comment: Occasional  . Drug use: No  . Sexual activity: Yes    Partners: Male    Birth control/protection: Surgical, Other-see comments    Comment: hysterectomy  Lifestyle  . Physical activity    Days per week: Not on file    Minutes per session: Not on file  . Stress: Not on file  Relationships  . Social Herbalist on phone: Not on file    Gets together: Not on file    Attends religious service: Not on file    Active member of club or organization: Not on file    Attends meetings of clubs or organizations: Not on file    Relationship status: Not on file  . Intimate partner violence    Fear of current or ex partner: Not on file    Emotionally abused: Not on file    Physically abused: Not on file    Forced sexual activity: Not on file  Other Topics Concern  . Not on file  Social History Narrative   Married for 66 years   Two daughters, five grand children and 5 great grand children. Daughter lives in [place]. Other daughter lives in [place].    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    Exercise Does not exercise. Was going to exercise class but has since quite.    Diet: Veggies, chicken, salmon. Very little red meat. Eats lots of eggs.    Exercise --- walking , gym 3 classes a week     Review of Systems: General: negative for chills, fever, night sweats or weight changes.  Cardiovascular: negative for chest pain, dyspnea on exertion, edema, orthopnea, palpitations, paroxysmal nocturnal dyspnea or shortness of breath Dermatological: negative for rash Respiratory: negative for cough or wheezing  Urologic: negative for hematuria Abdominal: negative for nausea, vomiting, diarrhea, bright red blood per rectum, melena, or hematemesis Neurologic: negative for visual changes, syncope, or dizziness All other systems reviewed and are otherwise negative except as noted above.    Blood pressure 116/78, pulse 79, temperature (!) 95.9 F (35.5 C), height 5\' 2"  (1.575 m), weight 191 lb (86.6 kg).  General appearance: alert and no distress Neck: no adenopathy, no carotid bruit, no JVD, supple, symmetrical, trachea midline and thyroid not enlarged, symmetric, no tenderness/mass/nodules Lungs: clear to auscultation bilaterally Heart: regular rate and rhythm,  S1, S2 normal, no murmur, click, rub or gallop Extremities: extremities normal, atraumatic, no cyanosis or edema Pulses: 2+ and symmetric Skin: Skin color, texture, turgor normal. No rashes or lesions Neurologic: Alert and oriented X 3, normal strength and tone. Normal symmetric reflexes. Normal coordination and gait  EKG sinus rhythm at 79 with septal Q waves and anterior T wave inversion in leads V1 and V2 minimally changed from her last EKG performed in our office.  I personally reviewed this EKG.  ASSESSMENT AND PLAN:   Dyslipidemia, goal LDL below 70 History of dyslipidemia on high-dose statin therapy lipid profile performed 05/27/2019 revealing total cholesterol 143, LDL 68 HDL 47  Essential hypertension History of essential hypertension blood pressure measured today 116/78.  She is on carvedilol and lisinopril.  S/P CABG x 3 History of CAD status post cardiac catheterization performed by Dr. Ellyn Hack 05/25/2019 revealing severe two-vessel disease requiring CABG.  Her EF was normal.  Dr. Servando Snare performed coronary artery bypass grafting times 311/23/20 with a LIMA to the LAD, vein to a ramus branch and to a diagonal branch.  Her postop course was unremarkable.  She was discharged home on 05/25/2019.  Her major complaints are of  fatigue and poor sleeping pattern.  Carotid artery disease (HCC) Preop carotid Doppler showed moderate left ICA stenosis.  Carotid Dopplers will be repeated on an annual basis.      Lorretta Harp MD FACP,FACC,FAHA, [facility] 06/13/2019 3:20 PM

## 2019-06-27 ENCOUNTER — Encounter: Payer: Self-pay | Admitting: Adult Health

## 2019-06-28 ENCOUNTER — Encounter: Payer: Self-pay | Admitting: Adult Health

## 2019-07-02 ENCOUNTER — Encounter: Payer: Self-pay | Admitting: Adult Health

## 2019-07-02 ENCOUNTER — Other Ambulatory Visit: Payer: Self-pay | Admitting: Cardiothoracic Surgery

## 2019-07-02 DIAGNOSIS — Z951 Presence of aortocoronary bypass graft: Secondary | ICD-10-CM

## 2019-07-03 ENCOUNTER — Ambulatory Visit
Admission: RE | Admit: 2019-07-03 | Discharge: 2019-07-03 | Disposition: A | Discharge status: Home / Self Care | Payer: Medicare Other | Source: Ambulatory Visit | Attending: Cardiothoracic Surgery | Admitting: Cardiothoracic Surgery

## 2019-07-03 ENCOUNTER — Ambulatory Visit (INDEPENDENT_AMBULATORY_CARE_PROVIDER_SITE_OTHER): Payer: Self-pay | Admitting: Physician Assistant

## 2019-07-03 ENCOUNTER — Other Ambulatory Visit: Payer: Self-pay

## 2019-07-03 VITALS — BP 140/85 | HR 90 | Temp 97.8°F | Resp 20 | Ht 62.0 in | Wt 185.0 lb

## 2019-07-03 DIAGNOSIS — Z951 Presence of aortocoronary bypass graft: Secondary | ICD-10-CM

## 2019-07-03 DIAGNOSIS — I2510 Atherosclerotic heart disease of native coronary artery without angina pectoris: Secondary | ICD-10-CM

## 2019-07-03 MED ORDER — POTASSIUM CHLORIDE ER 10 MEQ PO CPCR: 20.0000 meq | ORAL_CAPSULE | Freq: Every day | ORAL | 0 refills | Status: DC

## 2019-07-03 MED ORDER — FUROSEMIDE 40 MG PO TABS: 40.0000 mg | ORAL_TABLET | Freq: Two times a day (BID) | ORAL | 0 refills | Status: DC

## 2019-07-03 NOTE — Progress Notes (Signed)
HPI: Patient returns for routine postoperative follow-up having undergone CABG x 3 on 05/28/2019. The patient's early postoperative recovery while in the hospital was notable for mildly elevated creatinine felt to be related to diuertic use. Since hospital discharge the patient reports that she feels like she isn't doing as well as when initially discharged.  She states she feels really good one day and wiped out the next.  Overall she doesn't feel like she has much energy. She also states she had issues with low blood pressure and his primary physician discontinued her Lisinopril.  She states when she contacts Cardiology office, they instruct her to contact PCP.  She is ambulating without significant difficulty.  She states her sleep has improved some.  She denies shortness of breath.  Her appetite hasn't improved much as things still don't taste very good.  She also has lost about 10 lbs since surgery.   Current Outpatient Medications  Medication Sig Dispense Refill  . acetaminophen (TYLENOL) 500 MG tablet Take 2 tablets (1,000 mg total) by mouth every 6 (six) hours. 30 tablet 0  . Ascorbic Acid (VITAMIN C) 1000 MG tablet Take 1,000 mg by mouth daily.    Marland Kitchen aspirin EC 325 MG EC tablet Take 1 tablet (325 mg total) by mouth daily. 30 tablet 0  . atorvastatin (LIPITOR) 80 MG tablet Take 1 tablet (80 mg total) by mouth daily at 6 PM. 30 tablet 1  . b complex vitamins tablet Take 1 tablet by mouth daily.    . calcium carbonate (TUMS - DOSED IN MG ELEMENTAL CALCIUM) 500 MG chewable tablet Chew 1 tablet by mouth daily as needed for indigestion or heartburn.     . carvedilol (COREG) 6.25 MG tablet Take 1 tablet (6.25 mg total) by mouth 2 (two) times daily with a meal. 180 tablet 1  . Cholecalciferol (VITAMIN D) 2000 UNITS CAPS Take by mouth.    . diphenhydrAMINE (BENADRYL) 25 MG tablet Take 25 mg by mouth at bedtime.    . fluocinonide ointment (LIDEX) AB-123456789 % Apply 1 application topically daily as needed  (itching).    . LEVOXYL 112 MCG tablet TAKE 1 TABLET DAILY (Patient taking differently: Take 112 mcg by mouth daily. ) 90 tablet 3  . lisinopril (ZESTRIL) 20 MG tablet Take 1 tablet (20 mg total) by mouth daily. 30 tablet 1  . omeprazole (PRILOSEC) 20 MG capsule TAKE 1 CAPSULE DAILY (Patient taking differently: Take 20 mg by mouth daily. ) 90 capsule 3   No current facility-administered medications for this visit.    Physical Exam:  BP 140/85   Pulse 90   Temp 97.8 F (36.6 C) (Skin)   Resp 20   Ht 5\' 2"  (1.575 m)   Wt 185 lb (83.9 kg)   SpO2 96% Comment: RA  BMI 33.84 kg/m   Gen: no apparent distress Heart: RRR Lungs: Diminished left base Incisions: chest tube sutures remain in place, surgical incisions are well healed.  Diagnostic Tests:  CXR: new moderate left pleural effusion   A/P:  1. CV- hemodynamically stable in NSR on Coreg, Lisinopril d/c'd by primary, BP okay in office today.  If fatigue persists could attempt reducing Coreg dose, however HR is acceptable 2. Pulm- new moderate left sided pleural effusion, patient wishes to avoid Thoracentesis if possible, she will be given 2 weeks of Lasix/Potassium and we will see her back in 2 weeks time with repeat CXR.  She was instructed to contact our office sooner, should she notice  she is developing worsening shortness of breath 3. Activity- increase as tolerated, patient wishes to not participate in cardiac rehab as she doesn't want to leave her home 4. RTC in 2 weeks with repeat CXR with EBG   Ellwood Handler, PA-C [facility] [phone]

## 2019-07-05 ENCOUNTER — Other Ambulatory Visit: Payer: Self-pay

## 2019-07-09 NOTE — Telephone Encounter (Signed)
I spoke with pt regarding my chart message. Lisinopril was recently stopped by primary care as pt was feeling weak and had low BP.  Yesterday her BP was 160/86.  Today she took Coreg and 10 mg lisinopril. BP about 2 hours after taking these was 149/55. Pt reports she stood up earlier today and felt dizzy like she was going to pass out. Had cold sweats and back pain. She lied down on the couch and felt better.  Currently her BP is 122/61 and heart rate 76. She is able to sit up without dizziness. Back pain is now gone. No longer having cold sweats. No shortness of breath. Was recently started on lasix by surgeon's office. Weight is 180 lbs today. Prior to this was 181 lbs on 1/2 and 1/3. I discussed orthostatic precautions with pt.  I asked her to check BP and pulse daily for several days and contact office with these readings.  I told her I would send message to Dr Gwenlyn Found and we would contact her if he wanted to make any changes.

## 2019-07-16 ENCOUNTER — Other Ambulatory Visit: Payer: Self-pay | Admitting: Cardiothoracic Surgery

## 2019-07-16 DIAGNOSIS — I25119 Atherosclerotic heart disease of native coronary artery with unspecified angina pectoris: Secondary | ICD-10-CM

## 2019-07-18 NOTE — Progress Notes (Signed)
[address]       [place],Hannah Morrow [id number]             [phone]      Leslye Cranford [place] Record [id number] Date of Birth: 1941-04-09  Referring: Leonie Man, MD Primary Care: [name], NP Primary Cardiologist: No primary care provider on file.   Chief Complaint:   POST OP FOLLOW UP OPERATIVE REPORT DATE OF PROCEDURE:  05/28/2019 PREOPERATIVE DIAGNOSIS:  Unstable angina. POSTOPERATIVE DIAGNOSES:  Unstable angina. SURGICAL PROCEDURE:  Coronary artery bypass grafting x3 with the left internal mammary to the left anterior descending coronary artery, reverse saphenous vein graft to the diagonal coronary artery, reverse saphenous vein graft to the intermediate  coronary artery, with right thigh endoscopic right greater saphenous vein harvesting. SURGEON:  Lanelle Bal, MD  History of Present Illness:     Patient comes to the office in follow-up after coronary artery bypass grafting done in November.  On her last visit she had a moderate left pleural effusion.  She returns today with a follow-up chest x-ray  She notes she has been increasing her physical activity appropriately, especially over the last 3 to 4 days she notes she is gained more endurance and strength.  She has had some difficulty sleeping.     Past Medical History:  Diagnosis Date  . Allergy    no meds  . ANXIETY   . Cancer [facility])    cervical cancer - hysterectomy  . GERD   . Hyperlipidemia   . Hypertension   . Hypothyroid   . Plantar fasciitis    right  . RESTLESS LEGS SYNDROME      Social History   Tobacco Use  Smoking Status Never Smoker  Smokeless Tobacco Never Used    Social History   Substance and Sexual Activity  Alcohol Use Yes  . Alcohol/week: 5.0 standard drinks  . Types: 5 Glasses of wine per week   Comment: Occasional     Allergies  Allergen Reactions  . Penicillins Shortness Of Breath    red face Did it involve swelling  of the face/tongue/throat, SOB, or low BP? Yes Did it involve sudden or severe rash/hives, skin peeling, or any reaction on the inside of your mouth or nose? Yes Did you need to seek medical attention at a hospital or doctor's office? Yes When did it last happen?40+ years If all above answers are "NO", may proceed with cephalosporin use.   . Amlodipine     Pt states it made her feel "awful and weird."  . Metoprolol Other (See Comments)    " feel weird"  . Betadine [Povidone Iodine] Rash  . Hydrocodone-Acetaminophen Rash    ras  . Tramadol Itching and Rash    Current Outpatient Medications  Medication Sig Dispense Refill  . acetaminophen (TYLENOL) 500 MG tablet Take 2 tablets (1,000 mg total) by mouth every 6 (six) hours. 30 tablet 0  . Ascorbic Acid (VITAMIN C) 1000 MG tablet Take 1,000 mg by mouth daily.    Marland Kitchen aspirin EC 325 MG EC tablet Take 1 tablet (325 mg total) by mouth daily. 30 tablet 0  . atorvastatin (LIPITOR) 80 MG tablet Take 1 tablet (80 mg total) by mouth daily at 6 PM. 30 tablet 1  . b complex vitamins tablet Take 1 tablet by mouth daily.    . calcium carbonate (TUMS - DOSED IN MG ELEMENTAL CALCIUM) 500 MG chewable tablet Chew 1 tablet  by mouth daily as needed for indigestion or heartburn.     . carvedilol (COREG) 6.25 MG tablet Take 1 tablet (6.25 mg total) by mouth 2 (two) times daily with a meal. 180 tablet 1  . Cholecalciferol (VITAMIN D) 2000 UNITS CAPS Take by mouth.    . diphenhydrAMINE (BENADRYL) 25 MG tablet Take 25 mg by mouth at bedtime.    . fluocinonide ointment (LIDEX) AB-123456789 % Apply 1 application topically daily as needed (itching).    . LEVOXYL 112 MCG tablet TAKE 1 TABLET DAILY (Patient taking differently: Take 112 mcg by mouth daily. ) 90 tablet 3  . omeprazole (PRILOSEC) 20 MG capsule TAKE 1 CAPSULE DAILY (Patient not taking: No sig reported) 90 capsule 3   No current facility-administered medications for this visit.       Physical Exam: BP  136/84 (BP Location: Right Arm, Patient Position: Sitting, Cuff Size: Large)   Pulse 84   Temp 97.7 F (36.5 C)   Resp 16   Ht 5\' 2"  (1.575 m)   Wt 83.9 kg   SpO2 99% Comment: RA  BMI 33.84 kg/m   General appearance: alert, cooperative and no distress Neurologic: intact Heart: regular rate and rhythm, S1, S2 normal, no murmur, click, rub or gallop Lungs: clear to auscultation bilaterally Abdomen: soft, non-tender; bowel sounds normal; no masses,  no organomegaly Extremities: extremities normal, atraumatic, no cyanosis or edema and Homans sign is negative, no sign of DVT Wound: Sternum is stable and well-healed   Diagnostic Studies & Laboratory data:     Recent Radiology Findings:   DG Chest 2 View  Result Date: 07/19/2019 CLINICAL DATA:  Coronary artery disease. EXAM: CHEST - 2 VIEW COMPARISON:  July 03, 2019. FINDINGS: The heart size and mediastinal contours are within normal limits. Both lungs are clear. No pneumothorax or pleural effusion is noted. Status post coronary bypass graft. The visualized skeletal structures are unremarkable. IMPRESSION: No active cardiopulmonary disease. Electronically Signed   By: Marijo Conception M.D.   On: 07/19/2019 15:55    I have independently reviewed the above radiology studies  and reviewed the findings with the patient.  Resolution of left pleural effusion noted on previous film  Recent Lab Findings: Lab Results  Component Value Date   WBC 11.9 (H) 05/31/2019   HGB 8.7 (L) 05/31/2019   HCT 27.9 (L) 05/31/2019   PLT 148 (L) 05/31/2019   GLUCOSE 127 (H) 06/02/2019   CHOL 143 05/27/2019   TRIG 141 05/27/2019   HDL 47 05/27/2019   LDLDIRECT 107.0 07/26/2017   LDLCALC 68 05/27/2019   ALT 26 07/28/2018   AST 25 07/28/2018   NA 139 06/02/2019   K 3.8 06/02/2019   CL 105 06/02/2019   CREATININE 1.23 (H) 06/02/2019   BUN 21 06/02/2019   CO2 24 06/02/2019   TSH 1.15 07/28/2018   INR 1.4 (H) 05/28/2019   HGBA1C 5.5 05/27/2019       Assessment / Plan:   Overall patient is making good progress following coronary artery bypass grafting in November She remains on aspirin beta blocker and Lipitor.  She does notes that the time of admission to the hospital her Lipitor was changed and dose increased.-She will call the cardiology office to straighten this out and to get her statin prescription through the mail in pharmacy.  Have encouraged her to continue her physical activity, ambulating outside as tolerated, she does not wish to go to cardiac rehab because of the concerns of Covid  infections.   Medication Changes: No orders of the defined types were placed in this encounter.     Grace Isaac MD      [address] [place], [address] Office [phone]     07/19/2019 4:20 PM

## 2019-07-19 ENCOUNTER — Other Ambulatory Visit: Payer: Self-pay

## 2019-07-19 ENCOUNTER — Encounter: Payer: Self-pay | Admitting: Cardiothoracic Surgery

## 2019-07-19 ENCOUNTER — Ambulatory Visit
Admission: RE | Admit: 2019-07-19 | Discharge: 2019-07-19 | Disposition: A | Discharge status: Home / Self Care | Payer: Medicare Other | Source: Ambulatory Visit | Attending: Cardiothoracic Surgery | Admitting: Cardiothoracic Surgery

## 2019-07-19 ENCOUNTER — Ambulatory Visit (INDEPENDENT_AMBULATORY_CARE_PROVIDER_SITE_OTHER): Payer: Self-pay | Admitting: Cardiothoracic Surgery

## 2019-07-19 VITALS — BP 136/84 | HR 84 | Temp 97.7°F | Resp 16 | Ht 62.0 in | Wt 185.0 lb

## 2019-07-19 DIAGNOSIS — I2510 Atherosclerotic heart disease of native coronary artery without angina pectoris: Secondary | ICD-10-CM | POA: Diagnosis not present

## 2019-07-19 DIAGNOSIS — I25119 Atherosclerotic heart disease of native coronary artery with unspecified angina pectoris: Secondary | ICD-10-CM

## 2019-07-19 DIAGNOSIS — Z951 Presence of aortocoronary bypass graft: Secondary | ICD-10-CM

## 2019-07-20 ENCOUNTER — Telehealth: Payer: Self-pay | Admitting: Cardiovascular Disease

## 2019-07-20 DIAGNOSIS — E785 Hyperlipidemia, unspecified: Secondary | ICD-10-CM

## 2019-07-20 NOTE — Telephone Encounter (Signed)
Pt c/o medication issue:  1. Name of Medication: Simvastatin 40mg    2. How are you currently taking this medication (dosage and times per day)? N/A  3. Are you having a reaction (difficulty breathing--STAT)? no  4. What is your medication issue? Patient had post-op visit yesterday and states she had some questions about her Cholesterol. Patient states that she was given Simvastatin for her procedure on 05/25/19 for heart cath. She was told to stop taking it after discharge but she still has some left and was wondering if it would be okay to continue taking it until it runs out.

## 2019-07-20 NOTE — Telephone Encounter (Signed)
Her lipid probe file is actually excellent on her home dose of simvastatin.  This can be continued.  Recheck a lipid liver profile in 3 months

## 2019-07-20 NOTE — Telephone Encounter (Signed)
Spoke with pt who states she was on simvastatin 40 mg prior to her recent admission to the hospital. She states she was discharged on atorvastatin 80 mg although she has been on simvastatin for years. Per pt, during her recent appt with Dr. Servando Snare, he questioned why she was switched to another statin. Pt also states mail order pharmacy was supposed to have delivered to her, but this never occurred. Pt consulting Dr. Gwenlyn Found on whether it is okay for her to continue on simvastatin, especially since she has three bottles of the medication at home. Informed pt that message to be routed to Dr. Gwenlyn Found for review. Based on recommendation, refill of atorvastatin 80 mg can be sent to pt preferred pharmacy or current med list can be updated to reflect change in statin back to simvastatin

## 2019-07-20 NOTE — Telephone Encounter (Signed)
Pt made aware that her lipid profile is actually excellent on her home dose of simvastatin. This can be continued. Recheck a lipid liver profile in 3 months. She verbalized understanding. Pt questions if she should keep upcoming f/u appt with Kerin Ransom, PA-C. Advised pt to keep appt. Pt aware med list will be updated in Epic to reflect that she will d/c atorvastatin and continue on simvastatin 40 mg daily. Pt expects to receive lab slip in the mail.   Orders placed. Lab slip mailed to pt 07/20/2019

## 2019-07-27 ENCOUNTER — Other Ambulatory Visit: Payer: Self-pay | Admitting: Adult Health

## 2019-07-27 ENCOUNTER — Ambulatory Visit: Payer: Medicare Other | Attending: Internal Medicine

## 2019-07-27 DIAGNOSIS — Z23 Encounter for immunization: Secondary | ICD-10-CM | POA: Insufficient documentation

## 2019-07-27 NOTE — Progress Notes (Signed)
   Covid-19 Vaccination Clinic  Name:  Vi Claborn    MRN: [id number] DOB: 1940-11-06  07/27/2019  Ms. Berganza was observed post Covid-19 immunization for 15 minutes without incidence. She was provided with Vaccine Information Sheet and instruction to access the V-Safe system.   Ms. Milnor was instructed to call 911 with any severe reactions post vaccine: Marland Kitchen Difficulty breathing  . Swelling of your face and throat  . A fast heartbeat  . A bad rash all over your body  . Dizziness and weakness    Immunizations Administered    Name Date Dose VIS Date Route   Pfizer COVID-19 Vaccine 07/27/2019  2:02 PM 0.3 mL 06/15/2019 Intramuscular   Manufacturer: [place]   Lot: [id number]   [place]: [id number]

## 2019-08-06 ENCOUNTER — Encounter: Payer: Self-pay | Admitting: Adult Health

## 2019-08-14 ENCOUNTER — Telehealth: Payer: Self-pay | Admitting: Adult Health

## 2019-08-14 ENCOUNTER — Ambulatory Visit: Payer: Medicare Other | Attending: Internal Medicine

## 2019-08-14 DIAGNOSIS — Z23 Encounter for immunization: Secondary | ICD-10-CM | POA: Insufficient documentation

## 2019-08-14 NOTE — Telephone Encounter (Signed)
Please advise 

## 2019-08-14 NOTE — Progress Notes (Signed)
   Covid-19 Vaccination Clinic  Name:  Hannah Morrow    MRN: [id number] DOB: 1940/08/26  08/14/2019  [name] was observed post Covid-19 immunization for 30 minutes based on pre-vaccination screening without incidence. She was provided with Vaccine Information Sheet and instruction to access the V-Safe system.   [name] was instructed to call 911 with any severe reactions post vaccine: Marland Kitchen Difficulty breathing  . Swelling of your face and throat  . A fast heartbeat  . A bad rash all over your body  . Dizziness and weakness    Immunizations Administered    Name Date Dose VIS Date Route   Pfizer COVID-19 Vaccine 08/14/2019  1:36 PM 0.3 mL 06/15/2019 Intramuscular   Manufacturer: [place]   Lot: [id number]   [place]: [id number]

## 2019-08-14 NOTE — Telephone Encounter (Signed)
Called pt 2x with no answer. Per [facility] verbally ok for pt to receive 2nd vaccine as long as no fever or chills. If fever are chills are accompanied by cough pt should not get vaccine.

## 2019-08-14 NOTE — Telephone Encounter (Signed)
Called pt again 2x going to vm. Closing note.

## 2019-08-14 NOTE — Telephone Encounter (Signed)
Pt has a cough from her cold she had two weeks ago and is scheduled to get her 2nd covid vaccine today at 1:45pm  She wants to know if it is still ok to get the second shot?   Pt can be reached at [phone]

## 2019-08-15 ENCOUNTER — Encounter: Payer: Self-pay | Admitting: Adult Health

## 2019-08-16 ENCOUNTER — Encounter: Payer: Self-pay | Admitting: Adult Health

## 2019-08-17 ENCOUNTER — Other Ambulatory Visit: Payer: Self-pay

## 2019-08-17 ENCOUNTER — Telehealth (INDEPENDENT_AMBULATORY_CARE_PROVIDER_SITE_OTHER): Payer: Medicare Other | Admitting: Adult Health

## 2019-08-17 DIAGNOSIS — J988 Other specified respiratory disorders: Secondary | ICD-10-CM

## 2019-08-17 MED ORDER — ALBUTEROL SULFATE HFA 108 (90 BASE) MCG/ACT IN AERS: 2.0000 | INHALATION_SPRAY | Freq: Four times a day (QID) | RESPIRATORY_TRACT | 1 refills | Status: DC | PRN

## 2019-08-17 NOTE — Progress Notes (Signed)
Virtual Visit via Telephone Note  I connected with [name] on 08/17/19 at  1:30 PM EST by telephone and verified that I am speaking with the correct person using two identifiers.   I discussed the limitations, risks, security and privacy concerns of performing an evaluation and management service by telephone and the availability of in person appointments. I also discussed with the patient that there may be a patient responsible charge related to this service. The patient expressed understanding and agreed to proceed.  Location patient: home Location provider: work or home office Participants present for the call: patient, provider Patient did not have a visit in the prior 7 days to address this/these issue(s).   History of Present Illness: 79 year old female who is being evaluated today for an acute issue.  Reports that approximately 2 to 3 weeks ago she was sick with what she thought was "a really bad cold".  She was never tested for Covid and unfortunately earlier this week her husband was taken to the emergency room with Covid-like symptoms and tested positive as well as having pneumonia.  He is hopefully coming home this weekend.  Pam is wondering if she needs to go to the emergency room to have an x-ray done.  She reports that her symptoms have been improving and she is not feeling acutely ill.  Her biggest complaint is that of intermittent mild chest tightness that she has had since the beginning if her symptoms and a dry cough that is resolving. .  She denies productive cough, fevers, chills, shortness of breath, chest pain, dizziness, lightheadedness, nausea, vomiting.    Observations/Objective: Patient sounds cheerful and well on the phone. I do not appreciate any SOB. Speech and thought processing are grossly intact. Patient reported vitals:  Assessment and Plan: 1. Respiratory infection -Since she reports improving symptoms, denies fevers, chills, shortness of  breath, or feeling acutely ill; no reason to believe that she has pneumonia.  Will send in albuterol inhaler.  She was advised to follow-up in the emergency room if her symptoms do not improve or if symptoms worsen - albuterol (VENTOLIN HFA) 108 (90 Base) MCG/ACT inhaler; Inhale 2 puffs into the lungs every 6 (six) hours as needed for wheezing or shortness of breath.  Dispense: 6.7 g; Refill: 1   Follow Up Instructions:  I did not refer this patient for an OV in the next 24 hours for this/these issue(s).  I discussed the assessment and treatment plan with the patient. The patient was provided an opportunity to ask questions and all were answered. The patient agreed with the plan and demonstrated an understanding of the instructions.   The patient was advised to call back or seek an in-person evaluation if the symptoms worsen or if the condition fails to improve as anticipated.  I provided 20 minutes of non-face-to-face time during this encounter.   [name], NP

## 2019-08-17 NOTE — Telephone Encounter (Signed)
Pt's daughter, Hannah Morrow, called and wants to make Nafziger aware of her mother's symptoms. Pt's husband is currently in the hospital with Pneumonia and COVID. A nurse from [place] had a virtual visit with her mother and father. She stated that Sariah needs a chest x-ray due to what she is expereincing and what her husband is in the hospital for.  Rokisha has a horrible cough, chills, and possible fever.  I have set up a virtual visit with Tommi Rumps for today at 1:30 but daughter wanted to make sure he knows what is really going on.

## 2019-08-17 NOTE — Telephone Encounter (Signed)
Pt has been scheduled by front office staff. Nothing further at this time.

## 2019-08-20 ENCOUNTER — Encounter: Payer: Self-pay | Admitting: Adult Health

## 2019-08-20 ENCOUNTER — Telehealth: Payer: Self-pay | Admitting: Cardiovascular Disease

## 2019-08-20 DIAGNOSIS — Z20828 Contact with and (suspected) exposure to other viral communicable diseases: Secondary | ICD-10-CM | POA: Diagnosis not present

## 2019-08-20 NOTE — Telephone Encounter (Signed)
Spoke with patient. She reports she just wanted [place] to know she may have Covid. She has not been tested. Her husband has been hospitalized for Covid.   Patient has no requests just wanted to give [place] information.

## 2019-08-20 NOTE — Telephone Encounter (Signed)
New Message    Pt is calling and states her Husband tested positive a week ago tomorrow for covid and she thinks she may have it. She says she has no symptoms. She said she did have a cough that lasted about 3 weeks a months ago   She just wanted to inform her cardiologist

## 2019-08-21 NOTE — Telephone Encounter (Signed)
Please advise 

## 2019-08-30 DIAGNOSIS — L820 Inflamed seborrheic keratosis: Secondary | ICD-10-CM | POA: Diagnosis not present

## 2019-08-30 DIAGNOSIS — D485 Neoplasm of uncertain behavior of skin: Secondary | ICD-10-CM | POA: Diagnosis not present

## 2019-08-30 DIAGNOSIS — Z85828 Personal history of other malignant neoplasm of skin: Secondary | ICD-10-CM | POA: Diagnosis not present

## 2019-08-30 DIAGNOSIS — L570 Actinic keratosis: Secondary | ICD-10-CM | POA: Diagnosis not present

## 2019-09-03 ENCOUNTER — Other Ambulatory Visit: Payer: Self-pay | Admitting: Adult Health

## 2019-09-04 NOTE — Telephone Encounter (Signed)
Due for yearly visit.  LMOM for a return call.

## 2019-09-04 NOTE — Telephone Encounter (Signed)
30 DAY SUPPLY SENT TO THE PHARMACY BY E-SCRIBE. 

## 2019-09-04 NOTE — Telephone Encounter (Signed)
Patient said she will call back to schedule yearly physical.

## 2019-09-21 ENCOUNTER — Ambulatory Visit: Payer: Medicare Other | Admitting: Cardiology

## 2019-09-24 DIAGNOSIS — E785 Hyperlipidemia, unspecified: Secondary | ICD-10-CM | POA: Diagnosis not present

## 2019-09-24 LAB — HEPATIC FUNCTION PANEL
ALT: 23 IU/L (ref 0–32)
AST: 27 IU/L (ref 0–40)
Albumin: 4.4 g/dL (ref 3.7–4.7)
Alkaline Phosphatase: 53 IU/L (ref 39–117)
Bilirubin Total: 0.5 mg/dL (ref 0.0–1.2)
Bilirubin, Direct: 0.15 mg/dL (ref 0.00–0.40)
Total Protein: 7 g/dL (ref 6.0–8.5)

## 2019-09-24 LAB — LIPID PANEL
Chol/HDL Ratio: 3.1 ratio (ref 0.0–4.4)
Cholesterol, Total: 160 mg/dL (ref 100–199)
HDL: 51 mg/dL
LDL Chol Calc (NIH): 77 mg/dL (ref 0–99)
Triglycerides: 193 mg/dL — ABNORMAL HIGH (ref 0–149)
VLDL Cholesterol Cal: 32 mg/dL (ref 5–40)

## 2019-10-01 ENCOUNTER — Other Ambulatory Visit: Payer: Self-pay

## 2019-10-01 ENCOUNTER — Other Ambulatory Visit: Payer: Self-pay | Admitting: Adult Health

## 2019-10-01 ENCOUNTER — Ambulatory Visit
Admission: RE | Admit: 2019-10-01 | Discharge: 2019-10-01 | Disposition: A | Discharge status: Home / Self Care | Payer: Medicare Other | Source: Ambulatory Visit | Attending: Adult Health | Admitting: Adult Health

## 2019-10-01 DIAGNOSIS — R921 Mammographic calcification found on diagnostic imaging of breast: Secondary | ICD-10-CM | POA: Diagnosis not present

## 2019-10-01 DIAGNOSIS — R928 Other abnormal and inconclusive findings on diagnostic imaging of breast: Secondary | ICD-10-CM

## 2019-10-02 ENCOUNTER — Ambulatory Visit (INDEPENDENT_AMBULATORY_CARE_PROVIDER_SITE_OTHER): Payer: Medicare Other | Admitting: Cardiology

## 2019-10-02 ENCOUNTER — Encounter: Payer: Self-pay | Admitting: Cardiology

## 2019-10-02 VITALS — BP 140/80 | HR 78 | Temp 95.7°F | Ht 62.0 in | Wt 187.4 lb

## 2019-10-02 DIAGNOSIS — N183 Chronic kidney disease, stage 3 unspecified: Secondary | ICD-10-CM | POA: Insufficient documentation

## 2019-10-02 DIAGNOSIS — J069 Acute upper respiratory infection, unspecified: Secondary | ICD-10-CM | POA: Diagnosis not present

## 2019-10-02 DIAGNOSIS — E785 Hyperlipidemia, unspecified: Secondary | ICD-10-CM

## 2019-10-02 DIAGNOSIS — D649 Anemia, unspecified: Secondary | ICD-10-CM

## 2019-10-02 DIAGNOSIS — I10 Essential (primary) hypertension: Secondary | ICD-10-CM

## 2019-10-02 DIAGNOSIS — Z951 Presence of aortocoronary bypass graft: Secondary | ICD-10-CM

## 2019-10-02 DIAGNOSIS — I6522 Occlusion and stenosis of left carotid artery: Secondary | ICD-10-CM

## 2019-10-02 DIAGNOSIS — N2889 Other specified disorders of kidney and ureter: Secondary | ICD-10-CM

## 2019-10-02 MED ORDER — ASPIRIN EC 81 MG PO TBEC: 81.0000 mg | DELAYED_RELEASE_TABLET | Freq: Every day | ORAL | Status: AC

## 2019-10-02 NOTE — Assessment & Plan Note (Signed)
Controlled on Coreg

## 2019-10-02 NOTE — Assessment & Plan Note (Signed)
CABG x 3 with LIMA-LAD, SVG-RI, SVG-Dx 05/29/2019 Normal LVF pre op by echo

## 2019-10-02 NOTE — Patient Instructions (Signed)
Medication Instructions:  Start aspirin 81mg  *If you need a refill on your cardiac medications before your next appointment, please call your pharmacy*  Lab Work: CBC TODAY  FASTING LIPID PANEL IN 5 MONTHS 1 WEEK BEFORE FOLLOW UP APPOINTMENT  If you have labs (blood work) drawn today and your tests are completely normal, you will receive your results only by:  [place] (if you have MyChart) OR A paper copy in the mail.  If you have any lab test that is abnormal or we need to change your treatment, we will call you to review the results.  Follow-Up: Your next appointment:  6 month(s)  In Person with Quay Burow, MD  At [facility], you and your health needs are our priority.  As part of our continuing mission to provide you with exceptional heart care, we have created designated Provider Care Teams.  These Care Teams include your primary Cardiologist (physician) and Advanced Practice Providers (APPs -  Physician Assistants and Nurse Practitioners) who all work together to provide you with the care you need, when you need it.  We recommend signing up for the patient portal called "MyChart".  Sign up information is provided on this After Visit Summary.  MyChart is used to connect with patients for Virtual Visits (Telemedicine).  Patients are able to view lab/test results, encounter notes, upcoming appointments, etc.  Non-urgent messages can be sent to your provider as well.   To learn more about what you can do with MyChart, go to [email or web address].

## 2019-10-02 NOTE — Assessment & Plan Note (Signed)
H/O URI Feb 2021- I suspect this was COVID despite negative test (husband admitted with COVID in Feb),

## 2019-10-02 NOTE — Assessment & Plan Note (Signed)
GFR 42 

## 2019-10-02 NOTE — Progress Notes (Signed)
Cardiology Office Note:    Date:  10/02/2019   ID:  Hannah Morrow, Hannah Morrow 04-22-1941, MRN [id number]  PCP:  [name], NP  Cardiologist:  No primary care provider on file.  Electrophysiologist:  None   Referring MD: [name], NP   CC: Occasional PVC, some DOE  History of Present Illness:    Hannah Morrow is a 79 y.o. female with a hx of hypertension and dyslipidemia.  Her husband is followed by Dr. Gwenlyn Found.  She was evaluated for chest pain in Nov 2020.  She was initially set up for coronary CTA but then presented to the office with a clear history of exertional chest pain and an abnormal EKG with T wave inversion.  She was admitted for diagnostic catheterization.  This revealed severe disease including left main disease.  She underwent LIMA to LAD, SVG to the ramus intermedius and a SVG to the diagonal.  She did well postoperatively.  In February 2020 she saw her PCP for an upper respiratory illness.  Her rapid Covid was negative but her husband was admitted in February [facility] to [facility] with Covid.  I suspect she had Covid as well.  She seen in the office today and has some complaints of palpitations.  It sounds like she is having PVCs..  She has had these before.  She also notes some dyspnea on exertion.  She admits she is under a significant amount of stress, she is helping care for her husband who is at home but still on oxygen.  She is not had exertional chest pain or chest pain similar to her precath symptoms.  Past Medical History:  Diagnosis Date  . Allergy    no meds  . ANXIETY   . Cancer [facility])    cervical cancer - hysterectomy  . GERD   . Hyperlipidemia   . Hypertension   . Hypothyroid   . Plantar fasciitis    right  . RESTLESS LEGS SYNDROME     Past Surgical History:  Procedure Laterality Date  . ABDOMINAL HYSTERECTOMY    . BREAST SURGERY     Bilateral breast reduction  . COLOSTOMY  04-22-2010   [name]  . cone bx     cervix  . CORONARY ARTERY BYPASS GRAFT N/A 05/28/2019   Procedure: CORONARY ARTERY BYPASS GRAFTING (CABG) using the LIMA to LAD; Endoscopic right saphenous vein harvesting to the Ramus and Diag1.;  Surgeon: Grace Isaac, MD;  Location: [place];  Service: Open Heart Surgery;  Laterality: N/A;  . LEFT HEART CATH AND CORONARY ANGIOGRAPHY N/A 05/25/2019   Procedure: LEFT HEART CATH AND CORONARY ANGIOGRAPHY;  Surgeon: Leonie Man, MD;  Location: [place] [facility];  Service: Cardiovascular;  Laterality: N/A;  . REDUCTION MAMMAPLASTY Bilateral   . TEE WITHOUT CARDIOVERSION N/A 05/28/2019   Procedure: TRANSESOPHAGEAL ECHOCARDIOGRAM (TEE);  Surgeon: Grace Isaac, MD;  Location: [place];  Service: Open Heart Surgery;  Laterality: N/A;  . WISDOM TOOTH EXTRACTION      Current Medications: Current Meds  Medication Sig  . acetaminophen (TYLENOL) 500 MG tablet Take 2 tablets (1,000 mg total) by mouth every 6 (six) hours.  Marland Kitchen albuterol (VENTOLIN HFA) 108 (90 Base) MCG/ACT inhaler Inhale 2 puffs into the lungs every 6 (six) hours as needed for wheezing or shortness of breath.  . Ascorbic Acid (VITAMIN C) 1000 MG tablet Take 1,000 mg by mouth daily.  Marland Kitchen aspirin 81 MG tablet Take 1 tablet (81 mg total) by  mouth daily.  Marland Kitchen b complex vitamins tablet Take 1 tablet by mouth daily.  . calcium carbonate (TUMS - DOSED IN MG ELEMENTAL CALCIUM) 500 MG chewable tablet Chew 1 tablet by mouth daily as needed for indigestion or heartburn.   . Cholecalciferol (VITAMIN D) 2000 UNITS CAPS Take by mouth.  . diphenhydrAMINE (BENADRYL) 25 MG tablet Take 25 mg by mouth at bedtime.  . fluocinonide ointment (LIDEX) [id number] % Apply 1 application topically daily as needed (itching).  Marland Kitchen levothyroxine (LEVOXYL) 112 MCG tablet Take 1 tablet (112 mcg total) by mouth daily. **DUE FOR YEARLY PHYSICAL**  . simvastatin (ZOCOR) 40 MG tablet TAKE 1 TABLET AT BEDTIME  . [DISCONTINUED] aspirin EC 325 MG EC tablet Take 1 tablet (325 mg  total) by mouth daily.     Allergies:   Penicillins, Amlodipine, Metoprolol, Betadine [povidone iodine], Hydrocodone-acetaminophen, and Tramadol   Social History   Socioeconomic History  . Marital status: Married    Spouse name: Not on file  . Number of children: 2  . Years of education: Not on file  . Highest education level: Not on file  Occupational History  . Not on file  Tobacco Use  . Smoking status: Never Smoker  . Smokeless tobacco: Never Used  Substance and Sexual Activity  . Alcohol use: Yes    Alcohol/week: 5.0 standard drinks    Types: 5 Glasses of wine per week    Comment: Occasional  . Drug use: No  . Sexual activity: Yes    Partners: Male    Birth control/protection: Surgical, Other-see comments    Comment: hysterectomy  Other Topics Concern  . Not on file  Social History Narrative   Married for 106 years   Two daughters, five grand children and 5 great grand children. Daughter lives in [place]. Other daughter lives in [place].    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    Exercise Does not exercise. Was going to exercise class but has since quite.    Diet: Veggies, chicken, salmon. Very little red meat. Eats lots of eggs.    Exercise --- walking , gym 3 classes a week   Social Determinants of Health   Financial Resource Strain: Low Risk   . Difficulty of Paying Living Expenses: Not hard at all  Food Insecurity: Unknown  . Worried About Charity fundraiser in the Last Year: Patient refused  . Ran Out of Food in the Last Year: Patient refused  Transportation Needs: Unknown  . Lack of Transportation (Medical): Patient refused  . Lack of Transportation (Non-Medical): Patient refused  Physical Activity:   . Days of Exercise per Week:   . Minutes of Exercise per Session:   Stress:   . Feeling of Stress :   Social Connections:   . Frequency of Communication with Friends and Family:   . Frequency of Social  Gatherings with Friends and Family:   . Attends Religious Services:   . Active Member of Clubs or Organizations:   . Attends Archivist Meetings:   Marland Kitchen Marital Status:      Family History: The patient's family history includes Breast cancer (age of onset: 52) in her daughter; Breast cancer (age of onset: 64) in her mother; Cancer in her sister; Colon cancer (age of onset: 59) in her father; Stomach cancer in her maternal grandfather. There is no history of Rectal cancer or Esophageal cancer.  ROS:  Please see the history of present illness.     All other systems reviewed and are negative.  EKGs/Labs/Other Studies Reviewed:    The following studies were reviewed today: Cath nov 2020 Echo nov 2020  EKG:  EKG is not ordered today.  The ekg ordered 06/13/2019 demonstrates NSR, anterior TWI  Recent Labs: 05/29/2019: Magnesium 2.0 05/31/2019: Hemoglobin 8.7; Platelets 148 06/02/2019: BUN 21; Creatinine, Ser 1.23; Potassium 3.8; Sodium 139 09/24/2019: ALT 23  Recent Lipid Panel    Component Value Date/Time   CHOL 160 09/24/2019 1054   TRIG 193 (H) 09/24/2019 1054   TRIG 169 (H) 06/17/2006 0936   HDL 51 09/24/2019 1054   CHOLHDL 3.1 09/24/2019 1054   CHOLHDL 3.0 05/27/2019 0411   VLDL 28 05/27/2019 0411   LDLCALC 77 09/24/2019 1054   LDLDIRECT 107.0 07/26/2017 0939    Physical Exam:    VS:  BP 140/80   Pulse 78   Temp (!) 95.7 F (35.4 C)   Ht 5\' 2"  (1.575 m)   Wt 187 lb 6.4 oz (85 kg)   SpO2 97%   BMI 34.28 kg/m     Wt Readings from Last 3 Encounters:  10/02/19 187 lb 6.4 oz (85 kg)  07/19/19 185 lb (83.9 kg)  07/03/19 185 lb (83.9 kg)     GEN: Overweight Caucasian female, well developed in no acute distress HEENT: Normal NECK: No JVD; No carotid bruits CARDIAC: RRR, no murmurs, rubs, gallops RESPIRATORY:  Clear to auscultation without rales, wheezing or rhonchi  ABDOMEN: Soft, non-tender, non-distended MUSCULOSKELETAL:  No edema; No deformity  SKIN:  Warm and dry NEUROLOGIC:  Alert and oriented x 3 PSYCHIATRIC:  Normal affect   ASSESSMENT:    S/P CABG x 3 CABG x 3 with LIMA-LAD, SVG-RI, SVG-Dx 05/29/2019 Normal LVF pre op by echo  URI (upper respiratory infection) H/O URI Feb 2021- I suspect this was COVID despite negative test (husband admitted with [facility] in Feb),   Essential hypertension Controlled on Coreg  CRI (chronic renal insufficiency), stage 3 (moderate) GFR 42  Dyslipidemia, goal LDL below 70 LDL 77 on Zocor 40 mg - will keep on same dose and check after some months of stricter diet. If still > 70 consider adding Zetia  PLAN:    Decrease ASA to 81 mg daily. Check lipids in 6 months and f/u with Dr Gwenlyn Found.  Some of her symptoms of fatigue and DOE could be attributed to suspected COVID infection in Feb.  If her symptoms increase check echo. Check CBC today to document improvement in her post op anemia.    Medication Adjustments/Labs and Tests Ordered: Current medicines are reviewed at length with the patient today.  Concerns regarding medicines are outlined above.  Orders Placed This Encounter  Procedures  . Lipid Profile  . CBC   Meds ordered this encounter  Medications  . aspirin 81 MG tablet    Sig: Take 1 tablet (81 mg total) by mouth daily.    Patient Instructions  Medication Instructions:  Start aspirin 81mg  *If you need a refill on your cardiac medications before your next appointment, please call your pharmacy*  Lab Work: CBC TODAY  FASTING LIPID PANEL IN 5 MONTHS 1 WEEK BEFORE FOLLOW UP APPOINTMENT  If you have labs (blood work) drawn today and your tests are completely normal, you will receive your results only by:  [place] (if you have MyChart) OR A paper copy in the mail.  If you have any lab test that  is abnormal or we need to change your treatment, we will call you to review the results.  Follow-Up: Your next appointment:  6 month(s)  In Person with Quay Burow, MD  At [facility], you and your health needs are our priority.  As part of our continuing mission to provide you with exceptional heart care, we have created designated Provider Care Teams.  These Care Teams include your primary Cardiologist (physician) and Advanced Practice Providers (APPs -  Physician Assistants and Nurse Practitioners) who all work together to provide you with the care you need, when you need it.  We recommend signing up for the patient portal called "MyChart".  Sign up information is provided on this After Visit Summary.  MyChart is used to connect with patients for Virtual Visits (Telemedicine).  Patients are able to view lab/test results, encounter notes, upcoming appointments, etc.  Non-urgent messages can be sent to your provider as well.   To learn more about what you can do with MyChart, go to [email or web address].        Signed, Kerin Ransom, PA-C  10/02/2019 2:00 PM    [place]

## 2019-10-02 NOTE — Assessment & Plan Note (Signed)
LDL 77 on Zocor 40 mg - will keep on same dose and check after some months of stricter diet. If still > 70 consider adding Zetia

## 2019-10-03 LAB — CBC
Hematocrit: 41.4 % (ref 34.0–46.6)
Hemoglobin: 13 g/dL (ref 11.1–15.9)
MCH: 24.3 pg — ABNORMAL LOW (ref 26.6–33.0)
MCHC: 31.4 g/dL — ABNORMAL LOW (ref 31.5–35.7)
MCV: 77 fL — ABNORMAL LOW (ref 79–97)
Platelets: 248 10*3/uL (ref 150–450)
RBC: 5.36 x10E6/uL — ABNORMAL HIGH (ref 3.77–5.28)
RDW: 13.7 % (ref 11.7–15.4)
WBC: 7.8 10*3/uL (ref 3.4–10.8)

## 2019-10-17 ENCOUNTER — Other Ambulatory Visit: Payer: Self-pay | Admitting: Adult Health

## 2019-10-17 DIAGNOSIS — I10 Essential (primary) hypertension: Secondary | ICD-10-CM

## 2019-10-17 DIAGNOSIS — R002 Palpitations: Secondary | ICD-10-CM

## 2019-10-17 NOTE — Telephone Encounter (Signed)
[place] TO THE PHARMACY.  SHE IS PAST DUE FOR CPX.

## 2019-10-18 ENCOUNTER — Other Ambulatory Visit: Payer: Self-pay

## 2019-10-18 ENCOUNTER — Encounter: Payer: Self-pay | Admitting: Adult Health

## 2019-10-19 ENCOUNTER — Ambulatory Visit (INDEPENDENT_AMBULATORY_CARE_PROVIDER_SITE_OTHER): Payer: Medicare Other | Admitting: Adult Health

## 2019-10-19 ENCOUNTER — Encounter: Payer: Self-pay | Admitting: Adult Health

## 2019-10-19 VITALS — BP 140/78 | Temp 97.2°F | Wt 188.0 lb

## 2019-10-19 DIAGNOSIS — I6522 Occlusion and stenosis of left carotid artery: Secondary | ICD-10-CM | POA: Diagnosis not present

## 2019-10-19 DIAGNOSIS — M2550 Pain in unspecified joint: Secondary | ICD-10-CM

## 2019-10-19 NOTE — Progress Notes (Signed)
Subjective:    Patient ID: Hannah Morrow, female    DOB: May 30, 1941, 79 y.o.   MRN: [id number]  HPI 79 year old female who  has a past medical history of Allergy, ANXIETY, Cancer ([place]), GERD, Hyperlipidemia, Hypertension, Hypothyroid, Plantar fasciitis, and RESTLESS LEGS SYNDROME.  She presents to the office today for an acute issue of "hurting all over".  She reports sudden onset of pain in her joints including neck, shoulders, right hip and bilateral knees and bilateral wrists.  Her symptoms started 3 to 4 days ago and were worse yesterday than they are today.  Prior to the discomfort she was doing more exercises with her husband and the day before yesterday she was working outside [employer].  Today her pain is much improved but she continues to feel "achy" in the joints  She has been taking Tylenol which works minimally.  She denies fevers, chills, redness, or warmth to the joints.  She denies trauma.  She has not noticed any tick bites or had a full body rash.  No recent travel     Review of Systems See HPI   Past Medical History:  Diagnosis Date  . Allergy    no meds  . ANXIETY   . Cancer [facility])    cervical cancer - hysterectomy  . GERD   . Hyperlipidemia   . Hypertension   . Hypothyroid   . Plantar fasciitis    right  . RESTLESS LEGS SYNDROME     Social History   Socioeconomic History  . Marital status: Married    Spouse name: Not on file  . Number of children: 2  . Years of education: Not on file  . Highest education level: Not on file  Occupational History  . Not on file  Tobacco Use  . Smoking status: Never Smoker  . Smokeless tobacco: Never Used  Substance and Sexual Activity  . Alcohol use: Yes    Alcohol/week: 5.0 standard drinks    Types: 5 Glasses of wine per week    Comment: Occasional  . Drug use: No  . Sexual activity: Yes    Partners: Male    Birth control/protection: Surgical, Other-see comments    Comment: hysterectomy   Other Topics Concern  . Not on file  Social History Narrative   Married for 74 years   Two daughters, five grand children and 5 great grand children. Daughter lives in [place]. Other daughter lives in [place].    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    Exercise Does not exercise. Was going to exercise class but has since quite.    Diet: Veggies, chicken, salmon. Very little red meat. Eats lots of eggs.    Exercise --- walking , gym 3 classes a week   Social Determinants of Health   Financial Resource Strain: Low Risk   . Difficulty of Paying Living Expenses: Not hard at all  Food Insecurity: Unknown  . Worried About Charity fundraiser in the Last Year: Patient refused  . Ran Out of Food in the Last Year: Patient refused  Transportation Needs: Unknown  . Lack of Transportation (Medical): Patient refused  . Lack of Transportation (Non-Medical): Patient refused  Physical Activity:   . Days of Exercise per Week:   . Minutes of Exercise per Session:   Stress:   . Feeling of Stress :   Social Connections:   .  Frequency of Communication with Friends and Family:   . Frequency of Social Gatherings with Friends and Family:   . Attends Religious Services:   . Active Member of Clubs or Organizations:   . Attends Archivist Meetings:   Marland Kitchen Marital Status:   Intimate Partner Violence:   . Fear of Current or Ex-Partner:   . Emotionally Abused:   Marland Kitchen Physically Abused:   . Sexually Abused:     Past Surgical History:  Procedure Laterality Date  . ABDOMINAL HYSTERECTOMY    . BREAST SURGERY     Bilateral breast reduction  . COLOSTOMY  04-22-2010   [name]  . cone bx     cervix  . CORONARY ARTERY BYPASS GRAFT N/A 05/28/2019   Procedure: CORONARY ARTERY BYPASS GRAFTING (CABG) using the LIMA to LAD; Endoscopic right saphenous vein harvesting to the Ramus and Diag1.;  Surgeon: Grace Isaac, MD;  Location: [place];  Service:  Open Heart Surgery;  Laterality: N/A;  . LEFT HEART CATH AND CORONARY ANGIOGRAPHY N/A 05/25/2019   Procedure: LEFT HEART CATH AND CORONARY ANGIOGRAPHY;  Surgeon: Leonie Man, MD;  Location: [place] CV [facility];  Service: Cardiovascular;  Laterality: N/A;  . REDUCTION MAMMAPLASTY Bilateral   . TEE WITHOUT CARDIOVERSION N/A 05/28/2019   Procedure: TRANSESOPHAGEAL ECHOCARDIOGRAM (TEE);  Surgeon: Grace Isaac, MD;  Location: [place];  Service: Open Heart Surgery;  Laterality: N/A;  . WISDOM TOOTH EXTRACTION      Family History  Problem Relation Age of Onset  . Cancer Sister        melanoma- with mets  . Colon cancer Father 104  . Stomach cancer Maternal Grandfather   . Breast cancer Mother 26  . Breast cancer Daughter 20  . Rectal cancer Neg Hx   . Esophageal cancer Neg Hx     Allergies  Allergen Reactions  . Penicillins Shortness Of Breath    red face Did it involve swelling of the face/tongue/throat, SOB, or low BP? Yes Did it involve sudden or severe rash/hives, skin peeling, or any reaction on the inside of your mouth or nose? Yes Did you need to seek medical attention at a hospital or doctor's office? Yes When did it last happen?40+ years If all above answers are "NO", may proceed with cephalosporin use.   . Amlodipine     Pt states it made her feel "awful and weird."  . Metoprolol Other (See Comments)    " feel weird"  . Betadine [Povidone Iodine] Rash  . Hydrocodone-Acetaminophen Rash    ras  . Tramadol Itching and Rash    Current Outpatient Medications on File Prior to Visit  Medication Sig Dispense Refill  . acetaminophen (TYLENOL) 500 MG tablet Take 2 tablets (1,000 mg total) by mouth every 6 (six) hours. 30 tablet 0  . albuterol (VENTOLIN HFA) 108 (90 Base) MCG/ACT inhaler Inhale 2 puffs into the lungs every 6 (six) hours as needed for wheezing or shortness of breath. 6.7 g 1  . Ascorbic Acid (VITAMIN C) 1000 MG tablet Take 1,000 mg by mouth daily.     Marland Kitchen aspirin 81 MG tablet Take 1 tablet (81 mg total) by mouth daily.    Marland Kitchen b complex vitamins tablet Take 1 tablet by mouth daily.    . calcium carbonate (TUMS - DOSED IN MG ELEMENTAL CALCIUM) 500 MG chewable tablet Chew 1 tablet by mouth daily as needed for indigestion or heartburn.     . carvedilol (COREG) 6.25 MG tablet  Take 1 tablet (6.25 mg total) by mouth 2 (two) times daily with a meal. **DUE FOR YEARLY PHYSICAL** 60 tablet 0  . Cholecalciferol (VITAMIN D) 2000 UNITS CAPS Take by mouth.    . diphenhydrAMINE (BENADRYL) 25 MG tablet Take 25 mg by mouth at bedtime.    . fluocinonide ointment (LIDEX) [id number] % Apply 1 application topically daily as needed (itching).    Marland Kitchen levothyroxine (LEVOXYL) 112 MCG tablet Take 1 tablet (112 mcg total) by mouth daily. **DUE FOR YEARLY PHYSICAL** 30 tablet 0  . simvastatin (ZOCOR) 40 MG tablet TAKE 1 TABLET AT BEDTIME 90 tablet 0   No current facility-administered medications on file prior to visit.    BP 140/78   Temp (!) 97.2 F (36.2 C)   Wt 188 lb (85.3 kg)   BMI 34.39 kg/m       Objective:   Physical Exam Vitals and nursing note reviewed.  Constitutional:      Appearance: Normal appearance.  Musculoskeletal:        General: Tenderness (Slight tenderness with palpation to bilateral shoulders) present. No swelling. Normal range of motion.  Skin:    General: Skin is warm and dry.  Neurological:     General: No focal deficit present.     Mental Status: She is alert and oriented to person, place, and time.  Psychiatric:        Mood and Affect: Mood normal.        Behavior: Behavior normal.        Thought Content: Thought content normal.        Judgment: Judgment normal.        Assessment & Plan:  1. Polyarthralgia -We discussed the possible causes of polyarthralgia including viral, osteoarthritis, rheumatoid arthritis, as well as exercise-induced joint pain.  Discussed getting labs and x-rays today, she would like to hold off until the  weekend is over and if the pain is still around then she will come back for these further tests.  I think this is a reasonable alternative.  She was advised that she can use Motrin 400 mg twice daily over the weekend and she will follow-up with me then.

## 2019-10-22 ENCOUNTER — Encounter: Payer: Self-pay | Admitting: Adult Health

## 2019-10-25 ENCOUNTER — Other Ambulatory Visit: Payer: Self-pay | Admitting: Adult Health

## 2019-11-26 ENCOUNTER — Encounter: Payer: Self-pay | Admitting: Adult Health

## 2019-11-26 ENCOUNTER — Other Ambulatory Visit: Payer: Self-pay | Admitting: Adult Health

## 2019-11-26 DIAGNOSIS — I10 Essential (primary) hypertension: Secondary | ICD-10-CM

## 2019-11-26 DIAGNOSIS — R002 Palpitations: Secondary | ICD-10-CM

## 2019-11-27 MED ORDER — CARVEDILOL 6.25 MG PO TABS: 6.2500 mg | ORAL_TABLET | Freq: Two times a day (BID) | ORAL | 0 refills | Status: DC

## 2019-11-27 MED ORDER — LEVOTHYROXINE SODIUM 112 MCG PO TABS: 112.0000 ug | ORAL_TABLET | Freq: Every day | ORAL | 0 refills | Status: DC

## 2019-11-27 NOTE — Addendum Note (Signed)
Addended by: Miles Costain T on: 11/27/2019 11:45 AM   Modules accepted: Orders

## 2019-11-27 NOTE — Telephone Encounter (Signed)
Received an alert that the prescription was not received by [facility].  Resent along with a message to disregard rx if already received.  Nothing further needed.

## 2019-11-27 NOTE — Addendum Note (Signed)
Addended by: Miles Costain T on: 11/27/2019 01:14 PM   Modules accepted: Orders

## 2019-11-27 NOTE — Telephone Encounter (Signed)
I have the patient scheduled for her physical on June 15 with [facility].   She needs Rx refills  levothyroxine (LEVOXYL) 112 MCG tablet  carvedilol (COREG) 6.25 MG tablet  [facility] [address] [place] Phone:  [phone]  Fax:  [phone]

## 2019-11-28 MED ORDER — LEVOTHYROXINE SODIUM 112 MCG PO TABS: 112.0000 ug | ORAL_TABLET | Freq: Every day | ORAL | 1 refills | Status: DC

## 2019-11-30 ENCOUNTER — Encounter: Payer: Self-pay | Admitting: Adult Health

## 2019-12-01 ENCOUNTER — Encounter: Payer: Self-pay | Admitting: Adult Health

## 2019-12-17 ENCOUNTER — Other Ambulatory Visit: Payer: Self-pay

## 2019-12-18 ENCOUNTER — Other Ambulatory Visit: Payer: Self-pay | Admitting: Family Medicine

## 2019-12-18 ENCOUNTER — Ambulatory Visit (INDEPENDENT_AMBULATORY_CARE_PROVIDER_SITE_OTHER): Payer: Medicare Other | Admitting: Adult Health

## 2019-12-18 ENCOUNTER — Other Ambulatory Visit (INDEPENDENT_AMBULATORY_CARE_PROVIDER_SITE_OTHER): Payer: Medicare Other

## 2019-12-18 ENCOUNTER — Encounter: Payer: Self-pay | Admitting: Adult Health

## 2019-12-18 VITALS — BP 150/100 | Temp 98.0°F | Ht 62.0 in | Wt 190.0 lb

## 2019-12-18 DIAGNOSIS — E559 Vitamin D deficiency, unspecified: Secondary | ICD-10-CM | POA: Diagnosis not present

## 2019-12-18 DIAGNOSIS — E782 Mixed hyperlipidemia: Secondary | ICD-10-CM

## 2019-12-18 DIAGNOSIS — K219 Gastro-esophageal reflux disease without esophagitis: Secondary | ICD-10-CM

## 2019-12-18 DIAGNOSIS — E039 Hypothyroidism, unspecified: Secondary | ICD-10-CM | POA: Diagnosis not present

## 2019-12-18 DIAGNOSIS — I10 Essential (primary) hypertension: Secondary | ICD-10-CM

## 2019-12-18 DIAGNOSIS — R5383 Other fatigue: Secondary | ICD-10-CM

## 2019-12-18 DIAGNOSIS — G4709 Other insomnia: Secondary | ICD-10-CM

## 2019-12-18 LAB — CBC WITH DIFFERENTIAL/PLATELET
Basophils Absolute: 0.1 10*3/uL (ref 0.0–0.1)
Basophils Relative: 1.4 % (ref 0.0–3.0)
Eosinophils Absolute: 0.2 10*3/uL (ref 0.0–0.7)
Eosinophils Relative: 4.1 % (ref 0.0–5.0)
HCT: 41.4 % (ref 36.0–46.0)
Hemoglobin: 13.3 g/dL (ref 12.0–15.0)
Lymphocytes Relative: 40.8 % (ref 12.0–46.0)
Lymphs Abs: 2.3 10*3/uL (ref 0.7–4.0)
MCHC: 32.3 g/dL (ref 30.0–36.0)
MCV: 76.1 fl — ABNORMAL LOW (ref 78.0–100.0)
Monocytes Absolute: 0.7 10*3/uL (ref 0.1–1.0)
Monocytes Relative: 11.8 % (ref 3.0–12.0)
Neutro Abs: 2.3 10*3/uL (ref 1.4–7.7)
Neutrophils Relative %: 41.9 % — ABNORMAL LOW (ref 43.0–77.0)
Platelets: 198 10*3/uL (ref 150.0–400.0)
RBC: 5.44 Mil/uL — ABNORMAL HIGH (ref 3.87–5.11)
RDW: 15.3 % (ref 11.5–15.5)
WBC: 5.6 10*3/uL (ref 4.0–10.5)

## 2019-12-18 LAB — COMPREHENSIVE METABOLIC PANEL
ALT: 19 U/L (ref 0–35)
AST: 20 U/L (ref 0–37)
Albumin: 4.5 g/dL (ref 3.5–5.2)
Alkaline Phosphatase: 51 U/L (ref 39–117)
BUN: 24 mg/dL — ABNORMAL HIGH (ref 6–23)
CO2: 26 mEq/L (ref 19–32)
Calcium: 10.1 mg/dL (ref 8.4–10.5)
Chloride: 105 mEq/L (ref 96–112)
Creatinine, Ser: 1.05 mg/dL (ref 0.40–1.20)
GFR: 50.72 mL/min — ABNORMAL LOW (ref >60.00)
Glucose, Bld: 108 mg/dL — ABNORMAL HIGH (ref 70–99)
Potassium: 4.2 mEq/L (ref 3.5–5.1)
Sodium: 138 mEq/L (ref 135–145)
Total Bilirubin: 0.8 mg/dL (ref 0.2–1.2)
Total Protein: 7.4 g/dL (ref 6.0–8.3)

## 2019-12-18 LAB — VITAMIN B12: Vitamin B-12: 444 pg/mL (ref 211–911)

## 2019-12-18 LAB — IBC + FERRITIN
Ferritin: 18.9 ng/mL (ref 10.0–291.0)
Iron: 69 ug/dL (ref 42–145)
Saturation Ratios: 14.4 % — ABNORMAL LOW (ref 20.0–50.0)
Transferrin: 343 mg/dL (ref 212.0–360.0)

## 2019-12-18 LAB — LIPID PANEL
Cholesterol: 158 mg/dL (ref 0–200)
HDL: 55.1 mg/dL (ref >39.00)
LDL Cholesterol: 72 mg/dL (ref 0–99)
NonHDL: 103.3
Total CHOL/HDL Ratio: 3
Triglycerides: 155 mg/dL — ABNORMAL HIGH (ref 0.0–149.0)
VLDL: 31 mg/dL (ref 0.0–40.0)

## 2019-12-18 LAB — VITAMIN D 25 HYDROXY (VIT D DEFICIENCY, FRACTURES): VITD: 45.96 ng/mL (ref 30.00–100.00)

## 2019-12-18 LAB — TSH: TSH: 0.14 u[IU]/mL — ABNORMAL LOW (ref 0.35–4.50)

## 2019-12-18 MED ORDER — TEMAZEPAM 15 MG PO CAPS: 15.0000 mg | ORAL_CAPSULE | Freq: Every evening | ORAL | 1 refills | Status: DC | PRN

## 2019-12-18 MED ORDER — LEVOTHYROXINE SODIUM 100 MCG PO TABS: 100.0000 ug | ORAL_TABLET | Freq: Every day | ORAL | 0 refills | Status: DC

## 2019-12-18 NOTE — Patient Instructions (Signed)
It was great seeing you today   We will follow up with you regarding your blood work   I have sent in restoril for you to take as needed for sleep

## 2019-12-18 NOTE — Progress Notes (Addendum)
Subjective:    Patient ID: Hannah Morrow, female    DOB: Dec 23, 1940, 79 y.o.   MRN: [id number]  HPI Patient presents for yearly preventative medicine examination. She is a pleasant 79 year old female who  has a past medical history of Allergy, ANXIETY, Cancer ([place]), GERD, Hyperlipidemia, Hypertension, Hypothyroid, Plantar fasciitis, and RESTLESS LEGS SYNDROME.  Hyperlipidemia -takes Zocor 40 mg.  She denies myalgia or fatigue Lab Results  Component Value Date   CHOL 160 09/24/2019   HDL 51 09/24/2019   LDLCALC 77 09/24/2019   LDLDIRECT 107.0 07/26/2017   TRIG 193 (H) 09/24/2019   CHOLHDL 3.1 09/24/2019   Essential Hypertension -takes Coreg 6.25 mg twice daily.  He denies dizziness, lightheadedness, chest pain, or shortness of breath.  She reports at home her pressure has been well controlled BP Readings from Last 3 Encounters:  12/18/19 (!) 150/100  10/19/19 140/78  10/02/19 140/80   CAD-she was evaluated by cardiology for chest pain in November 2020 and was initially set up for coronary CTA, she then presented to the office with exertional chest pain and abnormal EKG with T wave inversions.  She was admitted for diagnostic catheterization which revealed severe disease including left main disease.  She underwent LIMA to LAD, SVG to the ramus into her medius and SVG to the diagonal.  She does continue to complain of some episodes of palpitations and some mild dyspnea on exertion.  Insomnia -longstanding history of insomnia, goes in waves.  Last night she slept for about 1 hour.  She reports no issues with falling asleep but she cannot stay asleep.  She is unable to fall back asleep when she is up.  In the past have prescribed Restoril, she is unsure of it worked but is willing to go back on this as needed  Fatigue -is her biggest complaint today.  She reports chronic fatigue where she feels like she has no energy.  Denies depressive symptoms.  When she takes her dogs on a walk  she feels as though she has no strength in her legs and they are going to give out, longer she walks the better her symptoms get  Hypothyroidism - takes synthroid 112 mcg daily.  Lab Results  Component Value Date   TSH 1.15 07/28/2018     All immunizations and health maintenance protocols were reviewed with the patient and needed orders were placed.  Appropriate screening laboratory values were ordered for the patient including screening of hyperlipidemia, renal function and hepatic function.  Medication reconciliation,  past medical history, social history, problem list and allergies were reviewed in detail with the patient  Goals were established with regard to weight loss, exercise, and  diet in compliance with medications.  She is trying to walk her dog twice a day for a total of 2 miles, other than that I sit on the couch and watch TV".  She does try to eat a heart healthy diet  Wt Readings from Last 3 Encounters:  12/18/19 190 lb (86.2 kg)  10/19/19 188 lb (85.3 kg)  10/02/19 187 lb 6.4 oz (85 kg)     Review of Systems  Constitutional: Positive for fatigue.  HENT: Negative.   Eyes: Negative.   Respiratory: Positive for shortness of breath. Negative for cough, choking, chest tightness and wheezing.   Cardiovascular: Positive for palpitations. Negative for chest pain and leg swelling.  Gastrointestinal: Negative.   Endocrine: Negative.   Genitourinary: Negative.   Musculoskeletal: Positive for arthralgias.  Skin:  Negative.   Allergic/Immunologic: Negative.   Hematological: Negative.   Psychiatric/Behavioral: Negative.    Past Medical History:  Diagnosis Date  . Allergy    no meds  . ANXIETY   . Cancer [facility])    cervical cancer - hysterectomy  . GERD   . Hyperlipidemia   . Hypertension   . Hypothyroid   . Plantar fasciitis    right  . RESTLESS LEGS SYNDROME     Social History   Socioeconomic History  . Marital status: Married    Spouse name: Not on file    . Number of children: 2  . Years of education: Not on file  . Highest education level: Not on file  Occupational History  . Not on file  Tobacco Use  . Smoking status: Never Smoker  . Smokeless tobacco: Never Used  Vaping Use  . Vaping Use: Never assessed  Substance and Sexual Activity  . Alcohol use: Yes    Alcohol/week: 5.0 standard drinks    Types: 5 Glasses of wine per week    Comment: Occasional  . Drug use: No  . Sexual activity: Yes    Partners: Male    Birth control/protection: Surgical, Other-see comments    Comment: hysterectomy  Other Topics Concern  . Not on file  Social History Narrative   Married for 75 years   Two daughters, five grand children and 5 great grand children. Daughter lives in [place]. Other daughter lives in [place].    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    Exercise Does not exercise. Was going to exercise class but has since quite.    Diet: Veggies, chicken, salmon. Very little red meat. Eats lots of eggs.    Exercise --- walking , gym 3 classes a week   Social Determinants of Health   Financial Resource Strain: Low Risk   . Difficulty of Paying Living Expenses: Not hard at all  Food Insecurity: Unknown  . Worried About Charity fundraiser in the Last Year: Patient refused  . Ran Out of Food in the Last Year: Patient refused  Transportation Needs: Unknown  . Lack of Transportation (Medical): Patient refused  . Lack of Transportation (Non-Medical): Patient refused  Physical Activity:   . Days of Exercise per Week:   . Minutes of Exercise per Session:   Stress:   . Feeling of Stress :   Social Connections:   . Frequency of Communication with Friends and Family:   . Frequency of Social Gatherings with Friends and Family:   . Attends Religious Services:   . Active Member of Clubs or Organizations:   . Attends Archivist Meetings:   Marland Kitchen Marital Status:   Intimate Partner  Violence:   . Fear of Current or Ex-Partner:   . Emotionally Abused:   Marland Kitchen Physically Abused:   . Sexually Abused:     Past Surgical History:  Procedure Laterality Date  . ABDOMINAL HYSTERECTOMY    . BREAST SURGERY     Bilateral breast reduction  . COLOSTOMY  04-22-2010   [name]  . cone bx     cervix  . CORONARY ARTERY BYPASS GRAFT N/A 05/28/2019   Procedure: CORONARY ARTERY BYPASS GRAFTING (CABG) using the LIMA to LAD; Endoscopic right saphenous vein harvesting to the Ramus and Diag1.;  Surgeon: Grace Isaac, MD;  Location: [place];  Service: Open Heart Surgery;  Laterality: N/A;  .  LEFT HEART CATH AND CORONARY ANGIOGRAPHY N/A 05/25/2019   Procedure: LEFT HEART CATH AND CORONARY ANGIOGRAPHY;  Surgeon: Leonie Man, MD;  Location: [place] [facility];  Service: Cardiovascular;  Laterality: N/A;  . REDUCTION MAMMAPLASTY Bilateral   . TEE WITHOUT CARDIOVERSION N/A 05/28/2019   Procedure: TRANSESOPHAGEAL ECHOCARDIOGRAM (TEE);  Surgeon: Grace Isaac, MD;  Location: [place];  Service: Open Heart Surgery;  Laterality: N/A;  . WISDOM TOOTH EXTRACTION      Family History  Problem Relation Age of Onset  . Cancer Sister        melanoma- with mets  . Colon cancer Father 41  . Stomach cancer Maternal Grandfather   . Breast cancer Mother 20  . Breast cancer Daughter 85  . Rectal cancer Neg Hx   . Esophageal cancer Neg Hx     Allergies  Allergen Reactions  . Penicillins Shortness Of Breath    red face Did it involve swelling of the face/tongue/throat, SOB, or low BP? Yes Did it involve sudden or severe rash/hives, skin peeling, or any reaction on the inside of your mouth or nose? Yes Did you need to seek medical attention at a hospital or doctor's office? Yes When did it last happen?40+ years If all above answers are "NO", may proceed with cephalosporin use.   . Amlodipine     Pt states it made her feel "awful and weird."  . Metoprolol Other (See Comments)    "  feel weird"  . Betadine [Povidone Iodine] Rash  . Hydrocodone-Acetaminophen Rash    ras  . Tramadol Itching and Rash    Current Outpatient Medications on File Prior to Visit  Medication Sig Dispense Refill  . Ascorbic Acid (VITAMIN C) 1000 MG tablet Take 1,000 mg by mouth daily.    Marland Kitchen aspirin 81 MG tablet Take 1 tablet (81 mg total) by mouth daily.    Marland Kitchen b complex vitamins tablet Take 1 tablet by mouth daily.    . calcium carbonate (TUMS - DOSED IN MG ELEMENTAL CALCIUM) 500 MG chewable tablet Chew 1 tablet by mouth daily as needed for indigestion or heartburn.     . carvedilol (COREG) 6.25 MG tablet Take 1 tablet (6.25 mg total) by mouth 2 (two) times daily with a meal. **DUE FOR PHYSICAL** 180 tablet 0  . Cholecalciferol (VITAMIN D) 2000 UNITS CAPS Take by mouth.    . fluocinonide ointment (LIDEX) 9.24 % Apply 1 application topically daily as needed (itching).    Marland Kitchen ibuprofen (ADVIL) 200 MG tablet Take 400 mg by mouth in the morning and at bedtime.    Marland Kitchen levothyroxine (LEVOXYL) 112 MCG tablet Take 1 tablet (112 mcg total) by mouth daily before breakfast. **DUE FOR PHYSICAL** 90 tablet 0  . simvastatin (ZOCOR) 40 MG tablet TAKE 1 TABLET AT BEDTIME 90 tablet 1   No current facility-administered medications on file prior to visit.    BP (!) 150/100   Temp 98 F (36.7 C)   Ht 5\' 2"  (1.575 m)   Wt 190 lb (86.2 kg)   BMI 34.75 kg/m       Objective:   Physical Exam Vitals and nursing note reviewed.  Constitutional:      Appearance: Normal appearance. She is obese.  Cardiovascular:     Rate and Rhythm: Normal rate and regular rhythm.     Pulses: Normal pulses.     Heart sounds: Normal heart sounds.  Pulmonary:     Effort: Pulmonary effort is normal.  Breath sounds: Normal breath sounds.  Abdominal:     General: Abdomen is flat. Bowel sounds are normal.     Palpations: Abdomen is soft.  Musculoskeletal:        General: Normal range of motion.  Skin:    General: Skin is warm  and dry.     Capillary Refill: Capillary refill takes less than 2 seconds.  Neurological:     General: No focal deficit present.     Mental Status: She is alert and oriented to person, place, and time.  Psychiatric:        Mood and Affect: Mood normal.        Behavior: Behavior normal.        Thought Content: Thought content normal.        Judgment: Judgment normal.       Assessment & Plan:  1. Vitamin D deficiency  - Vitamin D, 25-hydroxy; Future  2. Essential hypertension - Better controlled at home. No change in medications  - CBC with Differential/Platelet; Future - Comprehensive metabolic panel; Future - Lipid panel; Future - TSH; Future - IBC + Ferritin; Future - Vitamin D, 25-hydroxy; Future - Vitamin B12; Future  3. Mixed hyperlipidemia - Consider adding Zetia if LDL is not below 70 - CBC with Differential/Platelet; Future - Comprehensive metabolic panel; Future - Lipid panel; Future - TSH; Future - IBC + Ferritin; Future - Vitamin D, 25-hydroxy; Future - Vitamin B12; Future  4. Hypothyroidism, unspecified type - Consider dose change  - CBC with Differential/Platelet; Future - Comprehensive metabolic panel; Future - Lipid panel; Future - TSH; Future - IBC + Ferritin; Future - Vitamin D, 25-hydroxy; Future - Vitamin B12; Future   5. Fatigue, unspecified type -Likely due to insomnia and sedentary lifestyle.  Will check labs, she is okay with having of vitamin B12 and iron panel done despite Medicare paying for this. - CBC with Differential/Platelet; Future - Comprehensive metabolic panel; Future - Lipid panel; Future - TSH; Future - IBC + Ferritin; Future - Vitamin D, 25-hydroxy; Future - Vitamin B12; Future  7. Other insomnia - Place back on Restoril - temazepam (RESTORIL) 15 MG capsule; Take 1 capsule (15 mg total) by mouth at bedtime as needed for sleep.  Dispense: 30 capsule; Refill: 1   [name], NP

## 2019-12-19 ENCOUNTER — Encounter: Payer: Self-pay | Admitting: Adult Health

## 2020-01-15 ENCOUNTER — Encounter: Payer: Self-pay | Admitting: Adult Health

## 2020-01-15 ENCOUNTER — Telehealth (INDEPENDENT_AMBULATORY_CARE_PROVIDER_SITE_OTHER): Payer: Medicare Other | Admitting: Adult Health

## 2020-01-15 ENCOUNTER — Other Ambulatory Visit (INDEPENDENT_AMBULATORY_CARE_PROVIDER_SITE_OTHER): Payer: Medicare Other

## 2020-01-15 ENCOUNTER — Other Ambulatory Visit: Payer: Self-pay

## 2020-01-15 VITALS — BP 152/72 | Wt 190.0 lb

## 2020-01-15 DIAGNOSIS — E039 Hypothyroidism, unspecified: Secondary | ICD-10-CM | POA: Diagnosis not present

## 2020-01-15 DIAGNOSIS — I6522 Occlusion and stenosis of left carotid artery: Secondary | ICD-10-CM

## 2020-01-15 DIAGNOSIS — R2681 Unsteadiness on feet: Secondary | ICD-10-CM

## 2020-01-15 DIAGNOSIS — I10 Essential (primary) hypertension: Secondary | ICD-10-CM

## 2020-01-15 NOTE — Addendum Note (Signed)
Addended by: Marrion Coy on: 01/15/2020 10:01 AM   Modules accepted: Orders

## 2020-01-15 NOTE — Progress Notes (Signed)
Virtual Visit via Video Note  I connected with Hannah Morrow  on 01/15/20 at 11:00 AM EDT by a video enabled telemedicine application and verified that I am speaking with the correct person using two identifiers.  Location patient: home Location provider:work or home office Persons participating in the virtual visit: patient, provider  I discussed the limitations of evaluation and management by telemedicine and the availability of in person appointments. The patient expressed understanding and agreed to proceed.   HPI: This 79 year old female who is being evaluated today for an acute issue.  Her symptoms started 4 days ago when she got up around 3 AM to use the restroom.  When she got out of bed she felt as though she was going to fall over and felt unsteady, she had to hold onto the walls in the hallway to get to the bathroom.  Throughout the weekend her symptoms would be present on and off in the morning but then would resolve as the day went on.  Today she feels "much better" and was able to walk the dog without difficulty.  She denies dizziness but feels as though "her equilibrium is off".  She also reports that her "face feels full".  She does not have any sinus pain but does experience pressure.  She does not have any ear pain.  She denies fevers, chills, nausea, vomiting.  She has not been using any over-the-counter medications.  Her blood pressures over the weekend were in the 150s over 70s, which is where she is for the most part.  Today blood pressure slightly elevated into the 426S systolic.  She did take her blood pressure medication this morning.  She is going to continue to monitor this and send me a MyChart message   ROS: See pertinent positives and negatives per HPI.  Past Medical History:  Diagnosis Date  . Allergy    no meds  . ANXIETY   . Cancer [facility])    cervical cancer - hysterectomy  . GERD   . Hyperlipidemia   . Hypertension   . Hypothyroid   . Plantar fasciitis     right  . RESTLESS LEGS SYNDROME     Past Surgical History:  Procedure Laterality Date  . ABDOMINAL HYSTERECTOMY    . BREAST SURGERY     Bilateral breast reduction  . COLOSTOMY  04-22-2010   [name]  . cone bx     cervix  . CORONARY ARTERY BYPASS GRAFT N/A 05/28/2019   Procedure: CORONARY ARTERY BYPASS GRAFTING (CABG) using the LIMA to LAD; Endoscopic right saphenous vein harvesting to the Ramus and Diag1.;  Surgeon: Grace Isaac, MD;  Location: [place];  Service: Open Heart Surgery;  Laterality: N/A;  . LEFT HEART CATH AND CORONARY ANGIOGRAPHY N/A 05/25/2019   Procedure: LEFT HEART CATH AND CORONARY ANGIOGRAPHY;  Surgeon: Leonie Man, MD;  Location: [place] CV LAB;  Service: Cardiovascular;  Laterality: N/A;  . REDUCTION MAMMAPLASTY Bilateral   . TEE WITHOUT CARDIOVERSION N/A 05/28/2019   Procedure: TRANSESOPHAGEAL ECHOCARDIOGRAM (TEE);  Surgeon: Grace Isaac, MD;  Location: [place];  Service: Open Heart Surgery;  Laterality: N/A;  . WISDOM TOOTH EXTRACTION      Family History  Problem Relation Age of Onset  . Cancer Sister        melanoma- with mets  . Colon cancer Father 32  . Stomach cancer Maternal Grandfather   . Breast cancer Mother 38  . Breast cancer Daughter 32  . Rectal cancer  Neg Hx   . Esophageal cancer Neg Hx        Current Outpatient Medications:  .  Ascorbic Acid (VITAMIN C) 1000 MG tablet, Take 1,000 mg by mouth daily., Disp: , Rfl:  .  aspirin 81 MG tablet, Take 1 tablet (81 mg total) by mouth daily., Disp: , Rfl:  .  b complex vitamins tablet, Take 1 tablet by mouth daily., Disp: , Rfl:  .  calcium carbonate (TUMS - DOSED IN MG ELEMENTAL CALCIUM) 500 MG chewable tablet, Chew 1 tablet by mouth daily as needed for indigestion or heartburn. , Disp: , Rfl:  .  carvedilol (COREG) 6.25 MG tablet, Take 1 tablet (6.25 mg total) by mouth 2 (two) times daily with a meal. **DUE FOR PHYSICAL**, Disp: 180 tablet, Rfl: 0 .  Cholecalciferol (VITAMIN  D) 2000 UNITS CAPS, Take by mouth., Disp: , Rfl:  .  fluocinonide ointment (LIDEX) 2.99 %, Apply 1 application topically daily as needed (itching)., Disp: , Rfl:  .  ibuprofen (ADVIL) 200 MG tablet, Take 400 mg by mouth in the morning and at bedtime., Disp: , Rfl:  .  levothyroxine (SYNTHROID) 100 MCG tablet, Take 1 tablet (100 mcg total) by mouth daily before breakfast., Disp: 30 tablet, Rfl: 0 .  simvastatin (ZOCOR) 40 MG tablet, TAKE 1 TABLET AT BEDTIME, Disp: 90 tablet, Rfl: 1 .  temazepam (RESTORIL) 15 MG capsule, Take 1 capsule (15 mg total) by mouth at bedtime as needed for sleep., Disp: 30 capsule, Rfl: 1  EXAM:  VITALS per patient if applicable:  GENERAL: alert, oriented, appears well and in no acute distress  HEENT: atraumatic, conjunttiva clear, no obvious abnormalities on inspection of external nose and ears  NECK: normal movements of the head and neck  LUNGS: on inspection no signs of respiratory distress, breathing rate appears normal, no obvious gross SOB, gasping or wheezing  CV: no obvious cyanosis  MS: moves all visible extremities without noticeable abnormality  PSYCH/NEURO: pleasant and cooperative, no obvious depression or anxiety, speech and thought processing grossly intact  ASSESSMENT AND PLAN:  Discussed the following assessment and plan: 1. Gait instability -Does not appear as vertigo.  Possible allergic sinus or eustachian tube dysfunction?Marland Kitchen  No signs of stroke noted.  We will have her try Flonase or Nasacort over the next few days to see if her symptoms continue to improve.  If no improvement she will follow-up with me via MyChart.  May need to get MRI of the brain   2. Essential hypertension - Consider increase in Coreg     I discussed the assessment and treatment plan with the patient. The patient was provided an opportunity to ask questions and all were answered. The patient agreed with the plan and demonstrated an understanding of the  instructions.   The patient was advised to call back or seek an in-person evaluation if the symptoms worsen or if the condition fails to improve as anticipated.   [name], NP

## 2020-01-16 ENCOUNTER — Encounter: Payer: Self-pay | Admitting: Adult Health

## 2020-01-16 ENCOUNTER — Other Ambulatory Visit: Payer: Self-pay | Admitting: Adult Health

## 2020-01-16 LAB — TSH: TSH: 0.77 mIU/L (ref 0.40–4.50)

## 2020-01-16 MED ORDER — LEVOTHYROXINE SODIUM 100 MCG PO TABS: 100.0000 ug | ORAL_TABLET | Freq: Every day | ORAL | 3 refills | Status: DC

## 2020-01-28 ENCOUNTER — Encounter: Payer: Self-pay | Admitting: Adult Health

## 2020-02-01 DIAGNOSIS — H40033 Anatomical narrow angle, bilateral: Secondary | ICD-10-CM | POA: Diagnosis not present

## 2020-02-01 DIAGNOSIS — H2513 Age-related nuclear cataract, bilateral: Secondary | ICD-10-CM | POA: Diagnosis not present

## 2020-02-01 DIAGNOSIS — H5203 Hypermetropia, bilateral: Secondary | ICD-10-CM | POA: Diagnosis not present

## 2020-02-02 ENCOUNTER — Other Ambulatory Visit: Payer: Self-pay | Admitting: Adult Health

## 2020-02-02 DIAGNOSIS — I10 Essential (primary) hypertension: Secondary | ICD-10-CM

## 2020-02-02 DIAGNOSIS — R002 Palpitations: Secondary | ICD-10-CM

## 2020-02-05 NOTE — Telephone Encounter (Signed)
SENT TO THE PHARMACY BY E-SCRIBE. 

## 2020-02-11 ENCOUNTER — Encounter: Payer: Self-pay | Admitting: Adult Health

## 2020-02-19 IMAGING — DX DG CHEST 2V
2 series · 2 of 2 positions shown · non-contrast
Comparison: 05/31/2019

CLINICAL DATA: Status post CABG

EXAM:
CHEST - 2 VIEW

[dg chest 2 view (1 of 2)]
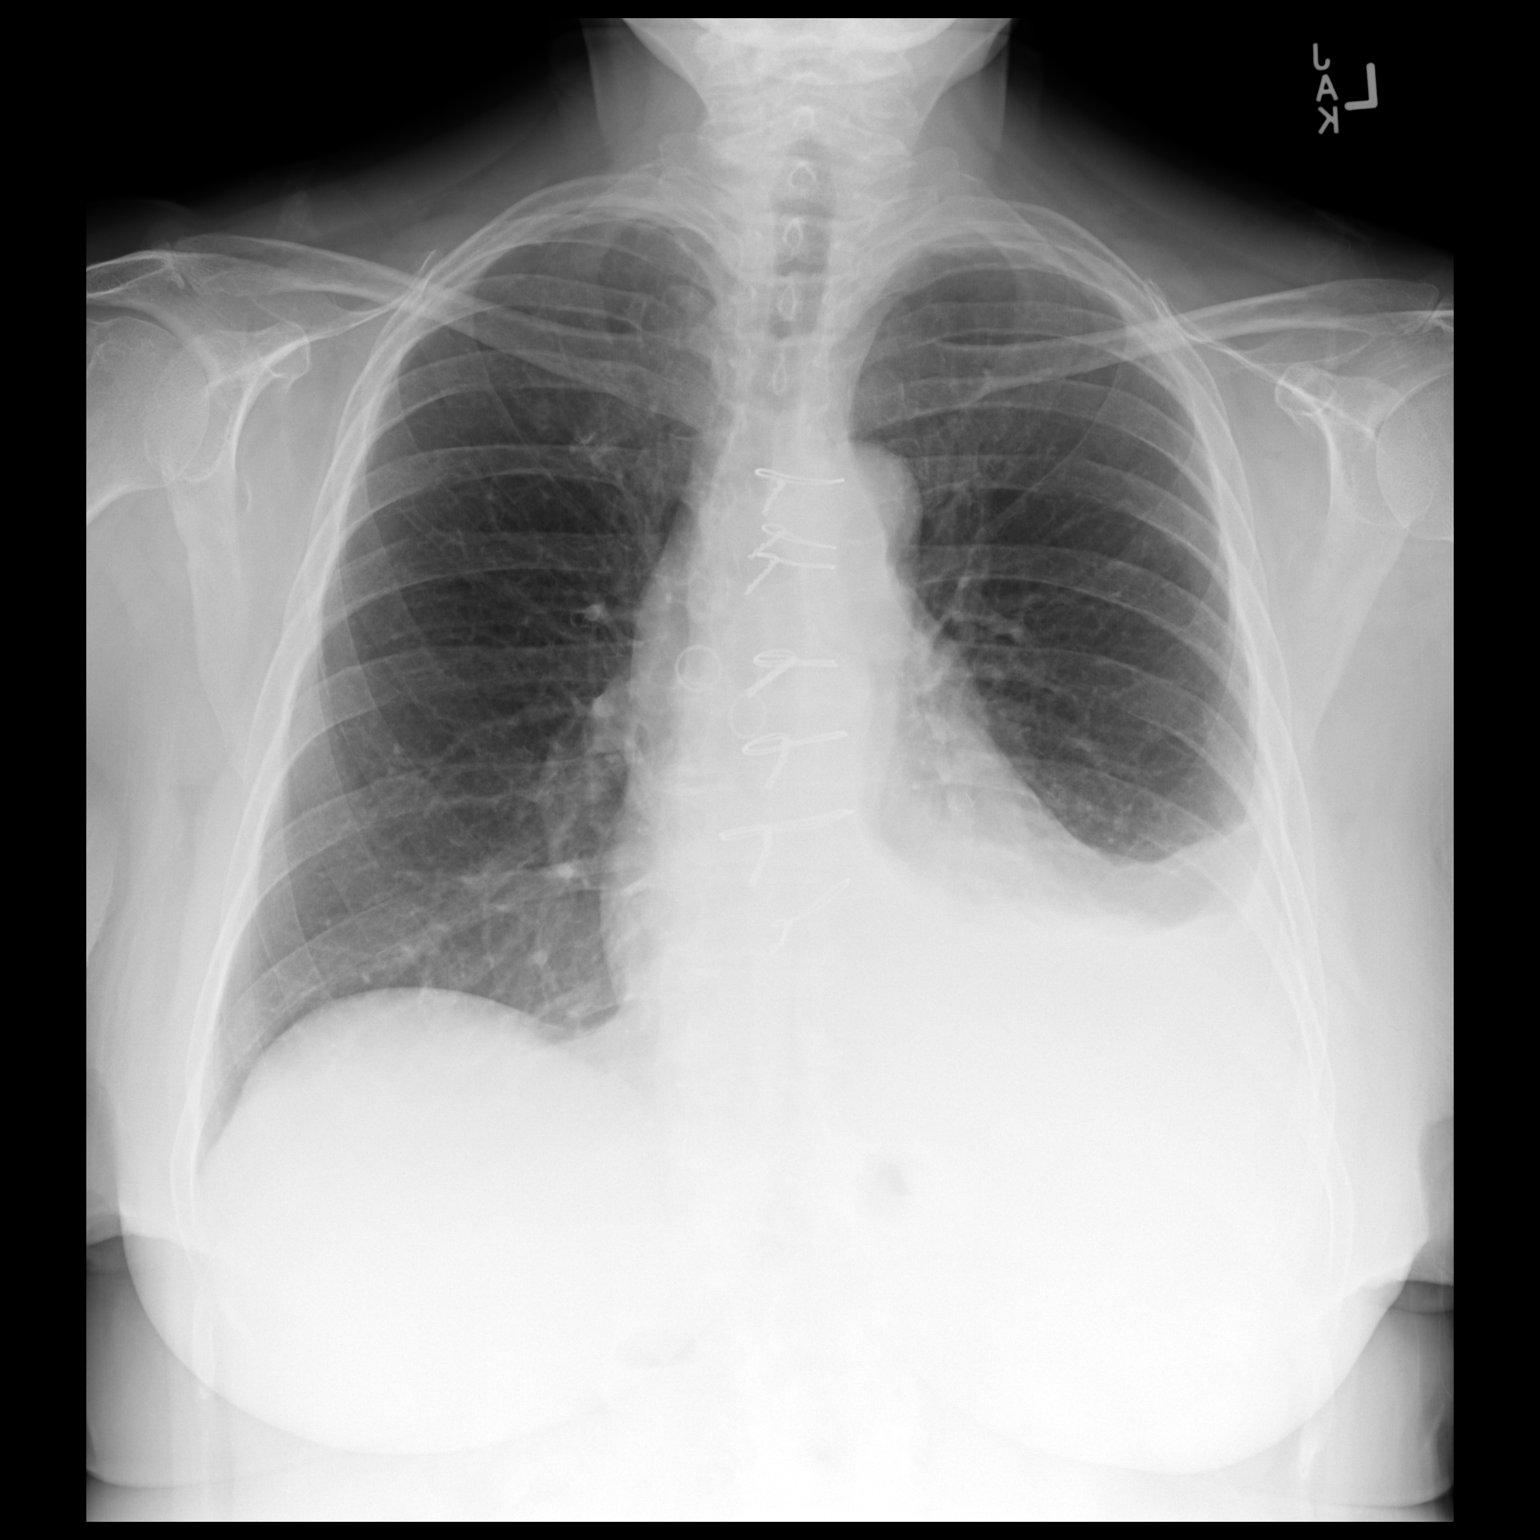

[dg chest 2 view (2 of 2)]
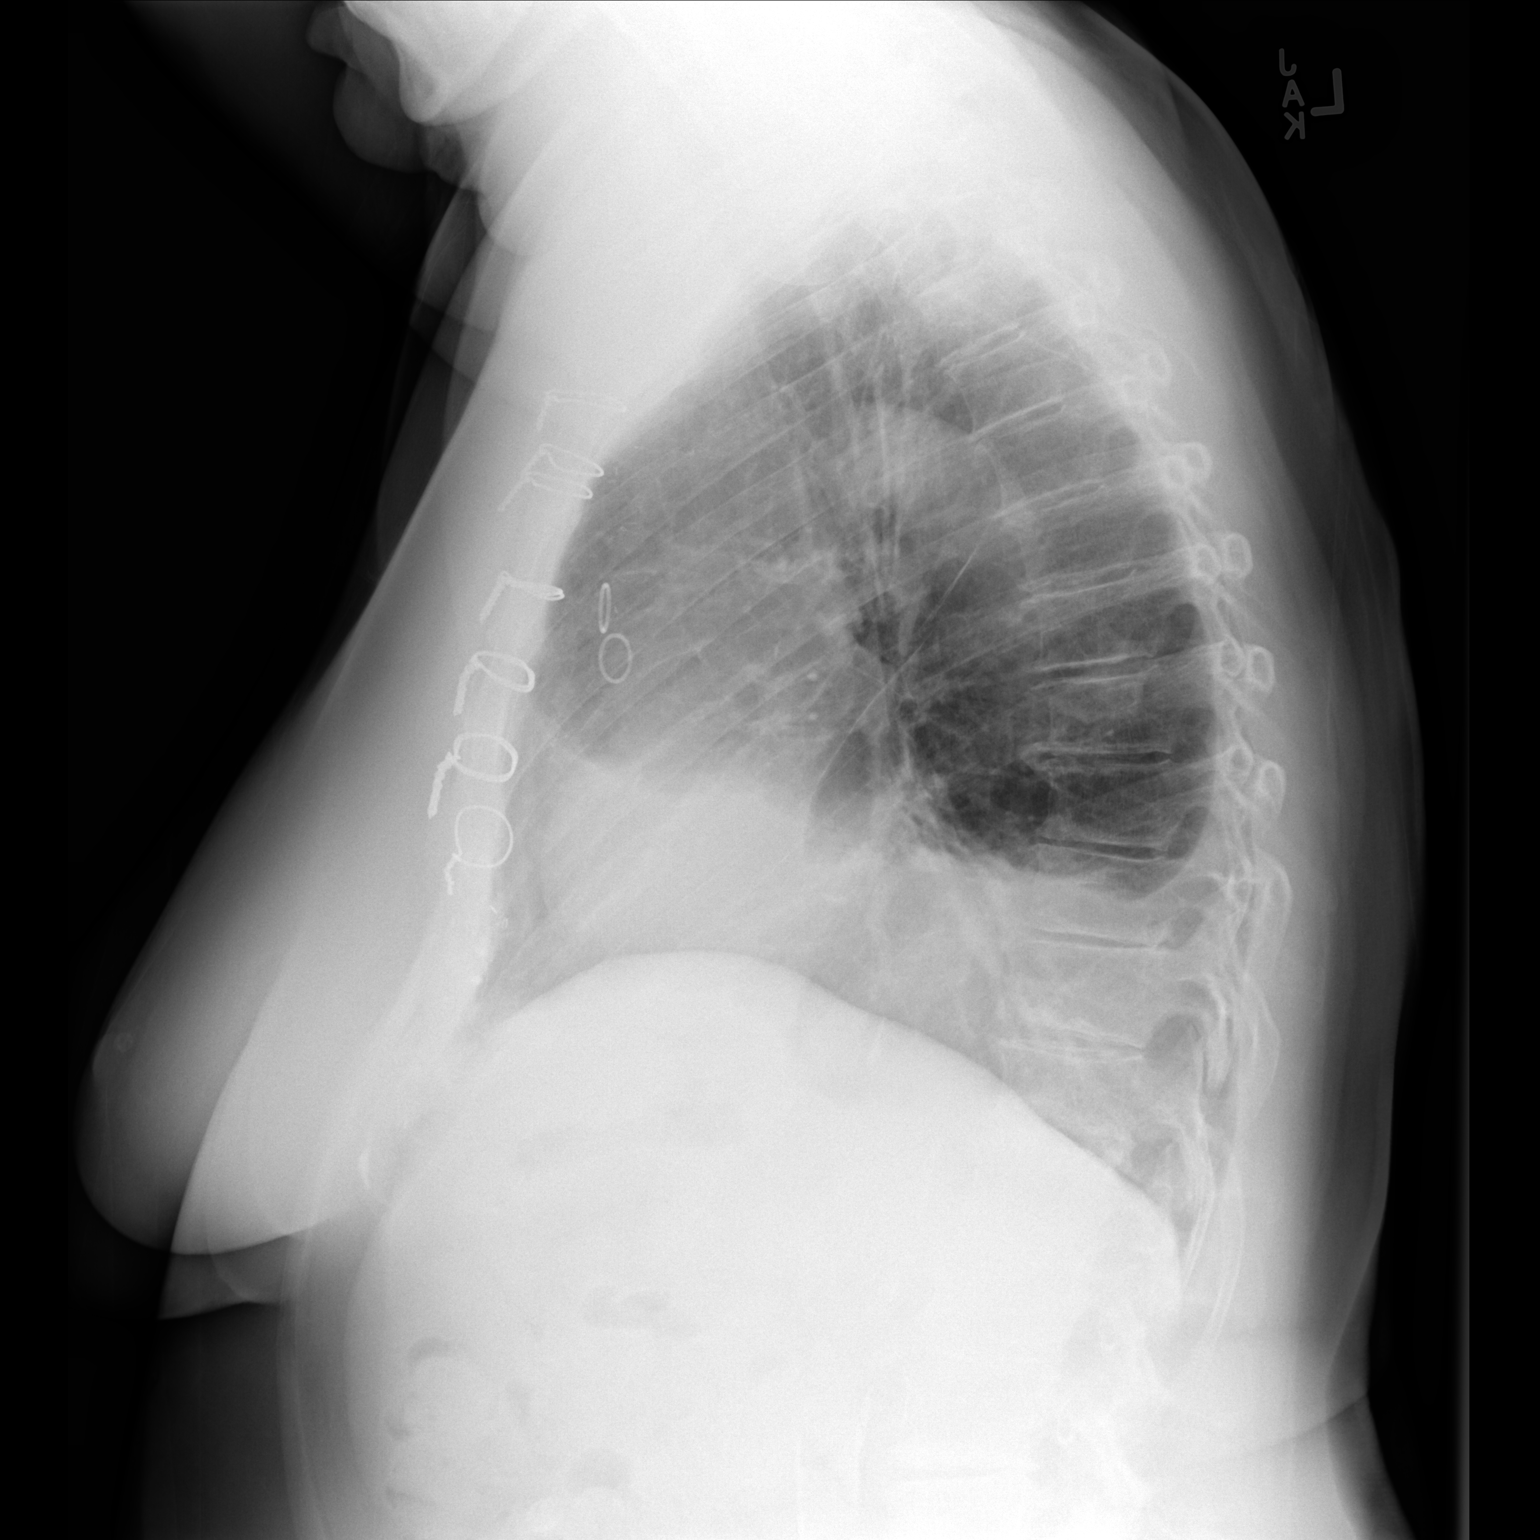

[2 of 2 positions shown; findings below may reference images not displayed]

FINDINGS: Cardiomegaly status post median sternotomy and CABG. Moderate left
pleural effusion and associated atelectasis or consolidation,
increased compared to prior examination. Disc degenerative disease
of the thoracic spine.
IMPRESSION: 1. Moderate left pleural effusion and associated atelectasis or
consolidation, increased compared to prior examination.
2. Cardiomegaly status post median sternotomy and CABG.

## 2020-02-20 ENCOUNTER — Encounter: Payer: Self-pay | Admitting: Adult Health

## 2020-02-20 ENCOUNTER — Ambulatory Visit (INDEPENDENT_AMBULATORY_CARE_PROVIDER_SITE_OTHER): Payer: Medicare Other | Admitting: Adult Health

## 2020-02-20 ENCOUNTER — Other Ambulatory Visit: Payer: Self-pay

## 2020-02-20 VITALS — BP 146/100 | Temp 98.1°F | Wt 195.0 lb

## 2020-02-20 DIAGNOSIS — M25531 Pain in right wrist: Secondary | ICD-10-CM

## 2020-02-20 DIAGNOSIS — I6522 Occlusion and stenosis of left carotid artery: Secondary | ICD-10-CM | POA: Diagnosis not present

## 2020-02-20 DIAGNOSIS — H6982 Other specified disorders of Eustachian tube, left ear: Secondary | ICD-10-CM

## 2020-02-20 NOTE — Progress Notes (Signed)
   Subjective:    Patient ID: Hannah Morrow, female    DOB: 04/13/1941, 79 y.o.   MRN: [id number]  HPI    Review of Systems     Objective:   Physical Exam        Assessment & Plan:

## 2020-02-20 NOTE — Progress Notes (Signed)
Subjective:    Patient ID: Hannah Morrow, female    DOB: July 01, 1941, 79 y.o.   MRN: [id number]  HPI 79 year old female who  has a past medical history of Allergy, ANXIETY, Cancer ([place]), GERD, Hyperlipidemia, Hypertension, Hypothyroid, Plantar fasciitis, and RESTLESS LEGS SYNDROME.  She presents to the office today for an acute issue of left ear pain.  Her symptoms started yesterday.  Reports pain from her left ear.  Associated symptoms include clear rhinorrhea but this is a chronic issue.  She denies drainage from the ear, fevers, chills, sinus pain or pressure.   She also reports pain to her left thumb.  This has been present since early spring when she started wearing a fit bit.  She does report loss of grip strength and pain with certain movements such as internal and external rotation.  She has had some mild swelling along the thumb but this seems to have resolved.  Tylenol did nothing for her but she has been taking Motrin and this seems to be helping   Review of Systems See HPI   Past Medical History:  Diagnosis Date   Allergy    no meds   ANXIETY    Cancer ([place])    cervical cancer - hysterectomy   GERD    Hyperlipidemia    Hypertension    Hypothyroid    Plantar fasciitis    right   RESTLESS LEGS SYNDROME     Social History   Socioeconomic History   Marital status: Married    Spouse name: Not on file   Number of children: 2   Years of education: Not on file   Highest education level: Not on file  Occupational History   Not on file  Tobacco Use   Smoking status: Never Smoker   Smokeless tobacco: Never Used  Vaping Use   Vaping Use: Never assessed  Substance and Sexual Activity   Alcohol use: Yes    Alcohol/week: 5.0 standard drinks    Types: 5 Glasses of wine per week    Comment: Occasional   Drug use: No   Sexual activity: Yes    Partners: Male    Birth control/protection: Surgical, Other-see comments    Comment:  hysterectomy  Other Topics Concern   Not on file  Social History Narrative   Married for 17 years   Two daughters, five grand children and 5 great grand children. Daughter lives in [place]. Other daughter lives in [place].    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    Exercise Does not exercise. Was going to exercise class but has since quite.    Diet: Veggies, chicken, salmon. Very little red meat. Eats lots of eggs.    Exercise --- walking , gym 3 classes a week   Social Determinants of Health   Financial Resource Strain: Low Risk    Difficulty of Paying Living Expenses: Not hard at all  Food Insecurity: Unknown   Worried About Charity fundraiser in the Last Year: Patient refused   Arboriculturist in the Last Year: Patient refused  Transportation Needs: Unknown   Film/video editor (Medical): Patient refused   Lack of Transportation (Non-Medical): Patient refused  Physical Activity:    Days of Exercise per Week:    Minutes of Exercise per Session:   Stress:    Feeling of Stress :   Social Connections:  Frequency of Communication with Friends and Family:    Frequency of Social Gatherings with Friends and Family:    Attends Religious Services:    Active Member of Clubs or Organizations:    Attends Music therapist:    Marital Status:   Intimate Partner Violence:    Fear of Current or Ex-Partner:    Emotionally Abused:    Physically Abused:    Sexually Abused:     Past Surgical History:  Procedure Laterality Date   ABDOMINAL HYSTERECTOMY     BREAST SURGERY     Bilateral breast reduction   COLOSTOMY  04-22-2010   [name]   cone bx     cervix   CORONARY ARTERY BYPASS GRAFT N/A 05/28/2019   Procedure: CORONARY ARTERY BYPASS GRAFTING (CABG) using the LIMA to LAD; Endoscopic right saphenous vein harvesting to the Ramus and Diag1.;  Surgeon: Grace Isaac, MD;  Location: [place];   Service: Open Heart Surgery;  Laterality: N/A;   LEFT HEART CATH AND CORONARY ANGIOGRAPHY N/A 05/25/2019   Procedure: LEFT HEART CATH AND CORONARY ANGIOGRAPHY;  Surgeon: Leonie Man, MD;  Location: [place] CV LAB;  Service: Cardiovascular;  Laterality: N/A;   REDUCTION MAMMAPLASTY Bilateral    TEE WITHOUT CARDIOVERSION N/A 05/28/2019   Procedure: TRANSESOPHAGEAL ECHOCARDIOGRAM (TEE);  Surgeon: Grace Isaac, MD;  Location: [place];  Service: Open Heart Surgery;  Laterality: N/A;   WISDOM TOOTH EXTRACTION      Family History  Problem Relation Age of Onset   Cancer Sister        melanoma- with mets   Colon cancer Father 24   Stomach cancer Maternal Grandfather    Breast cancer Mother 8   Breast cancer Daughter 73   Rectal cancer Neg Hx    Esophageal cancer Neg Hx     Allergies  Allergen Reactions   Penicillins Shortness Of Breath    red face Did it involve swelling of the face/tongue/throat, SOB, or low BP? Yes Did it involve sudden or severe rash/hives, skin peeling, or any reaction on the inside of your mouth or nose? Yes Did you need to seek medical attention at a hospital or doctor's office? Yes When did it last happen?40+ years If all above answers are NO, may proceed with cephalosporin use.    Amlodipine     Pt states it made her feel "awful and weird."   Metoprolol Other (See Comments)    " feel weird"   Betadine [Povidone Iodine] Rash   Hydrocodone-Acetaminophen Rash    ras   Tramadol Itching and Rash    Current Outpatient Medications on File Prior to Visit  Medication Sig Dispense Refill   Ascorbic Acid (VITAMIN C) 1000 MG tablet Take 1,000 mg by mouth daily.     aspirin 81 MG tablet Take 1 tablet (81 mg total) by mouth daily.     b complex vitamins tablet Take 1 tablet by mouth daily.     calcium carbonate (TUMS - DOSED IN MG ELEMENTAL CALCIUM) 500 MG chewable tablet Chew 1 tablet by mouth daily as needed for indigestion  or heartburn.      carvedilol (COREG) 6.25 MG tablet TAKE 1 TABLET TWICE A DAY WITH MEALS (DUE FOR PHYSICAL) 180 tablet 2   Cholecalciferol (VITAMIN D) 2000 UNITS CAPS Take by mouth.     fluocinonide ointment (LIDEX) 2.40 % Apply 1 application topically daily as needed (itching).     ibuprofen (ADVIL) 200 MG tablet Take 400  mg by mouth in the morning and at bedtime.     levothyroxine (SYNTHROID) 100 MCG tablet Take 1 tablet (100 mcg total) by mouth daily before breakfast. 90 tablet 3   simvastatin (ZOCOR) 40 MG tablet TAKE 1 TABLET AT BEDTIME 90 tablet 1   temazepam (RESTORIL) 15 MG capsule Take 1 capsule (15 mg total) by mouth at bedtime as needed for sleep. 30 capsule 1   No current facility-administered medications on file prior to visit.    BP (!) 146/100    Temp 98.1 F (36.7 C)    Wt 195 lb (88.5 kg)    BMI 35.67 kg/m       Objective:   Physical Exam Vitals and nursing note reviewed.  Constitutional:      Appearance: Normal appearance.  HENT:     Right Ear: Tympanic membrane, ear canal and external ear normal. There is no impacted cerumen.     Left Ear: Ear canal and external ear normal. A middle ear effusion is present. There is no impacted cerumen. Tympanic membrane is bulging. Tympanic membrane is not perforated or erythematous.  Musculoskeletal:        General: Tenderness (She has tenderness with range of motion to the carpal metacarpal, trapezium, trapezoid, and scaphoid joints.  No significant tenderness with palpation) present. No swelling. Normal range of motion.  Skin:    General: Skin is warm and dry.  Neurological:     General: No focal deficit present.     Mental Status: She is alert and oriented to person, place, and time.     Comments: Negative Tinels and phalens test  Psychiatric:        Mood and Affect: Mood normal.        Behavior: Behavior normal.        Thought Content: Thought content normal.        Judgment: Judgment normal.       Assessment  & Plan:  1. Eustachian tube dysfunction, left -No signs of bacterial infection.  Likely from seasonal allergies.  Continue Nasacort and can add Afrin for no longer than 3 days at a time.  Follow-up if no improvement over the next week  2. Right wrist pain -likely arthritis.  She would like to hold off on x-ray or referral to hand specialist at this time.  She will continue with Motrin and follow-up if no improvement  [name], NP

## 2020-02-27 ENCOUNTER — Telehealth: Payer: Self-pay | Admitting: Adult Health

## 2020-02-27 NOTE — Telephone Encounter (Signed)
Left message for patient to schedule Annual Wellness Visit.  Please schedule with Nurse Health Advisor Shannon Crews, RN at [facility]  

## 2020-02-29 DIAGNOSIS — D225 Melanocytic nevi of trunk: Secondary | ICD-10-CM | POA: Diagnosis not present

## 2020-02-29 DIAGNOSIS — D1801 Hemangioma of skin and subcutaneous tissue: Secondary | ICD-10-CM | POA: Diagnosis not present

## 2020-02-29 DIAGNOSIS — L821 Other seborrheic keratosis: Secondary | ICD-10-CM | POA: Diagnosis not present

## 2020-02-29 DIAGNOSIS — D2272 Melanocytic nevi of left lower limb, including hip: Secondary | ICD-10-CM | POA: Diagnosis not present

## 2020-02-29 DIAGNOSIS — D2262 Melanocytic nevi of left upper limb, including shoulder: Secondary | ICD-10-CM | POA: Diagnosis not present

## 2020-02-29 DIAGNOSIS — Z85828 Personal history of other malignant neoplasm of skin: Secondary | ICD-10-CM | POA: Diagnosis not present

## 2020-02-29 DIAGNOSIS — D2271 Melanocytic nevi of right lower limb, including hip: Secondary | ICD-10-CM | POA: Diagnosis not present

## 2020-02-29 DIAGNOSIS — C44311 Basal cell carcinoma of skin of nose: Secondary | ICD-10-CM | POA: Diagnosis not present

## 2020-02-29 DIAGNOSIS — L814 Other melanin hyperpigmentation: Secondary | ICD-10-CM | POA: Diagnosis not present

## 2020-03-07 ENCOUNTER — Other Ambulatory Visit: Payer: Self-pay

## 2020-03-07 ENCOUNTER — Encounter: Payer: Self-pay | Admitting: Cardiovascular Disease

## 2020-03-07 ENCOUNTER — Ambulatory Visit (INDEPENDENT_AMBULATORY_CARE_PROVIDER_SITE_OTHER): Payer: Medicare Other | Admitting: Cardiovascular Disease

## 2020-03-07 VITALS — BP 140/78 | HR 59 | Ht 62.0 in | Wt 194.0 lb

## 2020-03-07 DIAGNOSIS — Z951 Presence of aortocoronary bypass graft: Secondary | ICD-10-CM

## 2020-03-07 DIAGNOSIS — I6522 Occlusion and stenosis of left carotid artery: Secondary | ICD-10-CM | POA: Diagnosis not present

## 2020-03-07 DIAGNOSIS — I10 Essential (primary) hypertension: Secondary | ICD-10-CM

## 2020-03-07 DIAGNOSIS — E785 Hyperlipidemia, unspecified: Secondary | ICD-10-CM | POA: Diagnosis not present

## 2020-03-07 NOTE — Assessment & Plan Note (Signed)
History of CAD status post cardiac catheterization by Dr. Ellyn Hack 05/25/2019 revealing severe two-vessel disease ultimately requiring CABG 3 days later by Dr. Servando Snare.  She had a LIMA to the LAD, vein to the ramus and diagonal branch.  She was discharged home November 28 and has recuperated nicely.  She denies chest pain or shortness of breath.

## 2020-03-07 NOTE — Assessment & Plan Note (Signed)
History of dyslipidemia on statin therapy with lipid profile performed 12/18/2019 revealing total cholesterol 158, LDL 72 and HDL 55.

## 2020-03-07 NOTE — Patient Instructions (Signed)

## 2020-03-07 NOTE — Progress Notes (Signed)
03/07/2020 [name]   1941-02-12  [id number]  Primary Physician Nafziger, Tommi Rumps, NP Primary Cardiologist: Lorretta Harp MD [name], [name]  HPI:  Hannah Morrow is a 79 y.o.  moderately overweight married Caucasian female mother of 2 daughters, grandmother of 5 grandchildren and great grandmother of a great grandchildren referred by [name] NP for evaluation of chest pain. Her husband Lorenza Chick also a patient of mine.  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 Her risk factors include treated hypertension and hyperlipidemia. There is no family history of heart disease. She is never had a heart attack or stroke. She is otherwise healthy. She does have a history of GERD. She had chest pain last week lasting 3 days rating to her neck back shoulders and throat relieved with eructation. She has had no recurrent symptoms.  I had arranged for her to undergo undergo coronary CTA which was not scheduled scheduled until the beginning of December.  She came back in to see Kerin Ransom because of progressive angina on 05/23/2019 with anterior T wave inversion more pronounced than on the prior EKG.  She was admitted for cardiac cath performed by Dr. Ellyn Hack on 05/25/2019 revealing severe two-vessel disease requiring surgical revascularization which was performed 3 days later by Dr. Servando Snare.  She had LIMA to LAD, vein to ramus branch and diagonal branch.  She was discharged home on 28 November and has been recuperating at home with major complaints of fatigue.  Unfortunately she did not have a chance to undergo cardiac rehab because of COVID-19.  Her husband was admitted to [facility] in February for COVID-19 and he was hospitalized for 3 weeks.  He has recovered but has been diagnosed with pulmonary fibrosis.  He is 78 years old and is very active.  They walk several times a week with minimal limitation.   She denies chest pain or shortness of breath.  She is heading out to the [facility] area in 2 weeks to celebrate her daughter's 60th birthday with her.   Current Meds  Medication Sig  . Ascorbic Acid (VITAMIN C) 1000 MG tablet Take 1,000 mg by mouth daily.  Marland Kitchen aspirin 81 MG tablet Take 1 tablet (81 mg total) by mouth daily.  Marland Kitchen b complex vitamins tablet Take 1 tablet by mouth daily.  . calcium carbonate (TUMS - DOSED IN MG ELEMENTAL CALCIUM) 500 MG chewable tablet Chew 1 tablet by mouth daily as needed for indigestion or heartburn.   . carvedilol (COREG) 6.25 MG tablet TAKE 1 TABLET TWICE A DAY WITH MEALS (DUE FOR PHYSICAL)  . Cholecalciferol (VITAMIN D) 2000 UNITS CAPS Take by mouth.  . fluocinonide ointment (LIDEX) 2.59 % Apply 1 application topically daily as needed (itching).  Marland Kitchen ibuprofen (ADVIL) 200 MG tablet Take 400 mg by mouth in the morning and at bedtime.  Marland Kitchen levothyroxine (SYNTHROID) 100 MCG tablet Take 1 tablet (100 mcg total) by mouth daily before breakfast.  . simvastatin (ZOCOR) 40 MG tablet TAKE 1 TABLET AT BEDTIME  . [DISCONTINUED] temazepam (RESTORIL) 15 MG capsule Take 1 capsule (15 mg total) by mouth at bedtime as needed for sleep.     Allergies  Allergen Reactions  . Penicillins Shortness Of Breath    red face Did it involve swelling of the face/tongue/throat, SOB, or low BP? Yes Did it involve sudden or severe rash/hives, skin peeling, or any reaction on the inside of  your mouth or nose? Yes Did you need to seek medical attention at a hospital or doctor's office? Yes When did it last happen?40+ years If all above answers are "NO", may proceed with cephalosporin use.   . Amlodipine     Pt states it made her feel "awful and weird."  . Metoprolol Other (See Comments)    " feel weird"  . Betadine [Povidone Iodine] Rash  . Hydrocodone-Acetaminophen Rash    ras  . Tramadol Itching and Rash    Social History   Socioeconomic History  . Marital status:  Married    Spouse name: Not on file  . Number of children: 2  . Years of education: Not on file  . Highest education level: Not on file  Occupational History  . Not on file  Tobacco Use  . Smoking status: Never Smoker  . Smokeless tobacco: Never Used  Vaping Use  . Vaping Use: Never assessed  Substance and Sexual Activity  . Alcohol use: Yes    Alcohol/week: 5.0 standard drinks    Types: 5 Glasses of wine per week    Comment: Occasional  . Drug use: No  . Sexual activity: Yes    Partners: Male    Birth control/protection: Surgical, Other-see comments    Comment: hysterectomy  Other Topics Concern  . Not on file  Social History Narrative   Married for 32 years   Two daughters, five grand children and 5 great grand children. Daughter lives in [place]. Other daughter lives in [place].    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    Exercise Does not exercise. Was going to exercise class but has since quite.    Diet: Veggies, chicken, salmon. Very little red meat. Eats lots of eggs.    Exercise --- walking , gym 3 classes a week   Social Determinants of Health   Financial Resource Strain: Low Risk   . Difficulty of Paying Living Expenses: Not hard at all  Food Insecurity: Unknown  . Worried About Charity fundraiser in the Last Year: Patient refused  . Ran Out of Food in the Last Year: Patient refused  Transportation Needs: Unknown  . Lack of Transportation (Medical): Patient refused  . Lack of Transportation (Non-Medical): Patient refused  Physical Activity:   . Days of Exercise per Week: Not on file  . Minutes of Exercise per Session: Not on file  Stress:   . Feeling of Stress : Not on file  Social Connections:   . Frequency of Communication with Friends and Family: Not on file  . Frequency of Social Gatherings with Friends and Family: Not on file  . Attends Religious Services: Not on file  . Active Member of Clubs or  Organizations: Not on file  . Attends Archivist Meetings: Not on file  . Marital Status: Not on file  Intimate Partner Violence:   . Fear of Current or Ex-Partner: Not on file  . Emotionally Abused: Not on file  . Physically Abused: Not on file  . Sexually Abused: Not on file     Review of Systems: General: negative for chills, fever, night sweats or weight changes.  Cardiovascular: negative for chest pain, dyspnea on exertion, edema, orthopnea, palpitations, paroxysmal nocturnal dyspnea or shortness of breath Dermatological: negative for rash Respiratory: negative for cough or wheezing Urologic: negative for hematuria Abdominal: negative for nausea, vomiting, diarrhea, bright red blood  per rectum, melena, or hematemesis Neurologic: negative for visual changes, syncope, or dizziness All other systems reviewed and are otherwise negative except as noted above.    Blood pressure 140/78, pulse (!) 59, height 5\' 2"  (1.575 m), weight 194 lb (88 kg).  General appearance: alert and no distress Neck: no adenopathy, no carotid bruit, no JVD, supple, symmetrical, trachea midline and thyroid not enlarged, symmetric, no tenderness/mass/nodules Lungs: clear to auscultation bilaterally Heart: regular rate and rhythm, S1, S2 normal, no murmur, click, rub or gallop Extremities: extremities normal, atraumatic, no cyanosis or edema Pulses: 2+ and symmetric Skin: Skin color, texture, turgor normal. No rashes or lesions Neurologic: Alert and oriented X 3, normal strength and tone. Normal symmetric reflexes. Normal coordination and gait  EKG sinus bradycardia 59 with poor R wave progression.  I personally reviewed this EKG.  ASSESSMENT AND PLAN:   Dyslipidemia, goal LDL below 70 History of dyslipidemia on statin therapy with lipid profile performed 12/18/2019 revealing total cholesterol 158, LDL 72 and HDL 55.  Essential hypertension History of essential hypertension a blood pressure  measured at 140/78.  She is on carvedilol.  S/P CABG x 3 History of CAD status post cardiac catheterization by Dr. Ellyn Hack 05/25/2019 revealing severe two-vessel disease ultimately requiring CABG 3 days later by Dr. Servando Snare.  She had a LIMA to the LAD, vein to the ramus and diagonal branch.  She was discharged home November 28 and has recuperated nicely.  She denies chest pain or shortness of breath.  Carotid artery disease ([place]) History of moderate left ICA stenosis by duplex ultrasound performed 05/25/2019.  She is neurologically asymptomatic.  She is scheduled to have this rechecked this coming November.      Lorretta Harp MD FACP,FACC,FAHA, [facility] 03/07/2020 11:09 AM

## 2020-03-07 NOTE — Assessment & Plan Note (Signed)
History of essential hypertension a blood pressure measured at 140/78.  She is on carvedilol.

## 2020-03-07 NOTE — Assessment & Plan Note (Signed)
History of moderate left ICA stenosis by duplex ultrasound performed 05/25/2019.  She is neurologically asymptomatic.  She is scheduled to have this rechecked this coming November.

## 2020-03-31 DIAGNOSIS — C44311 Basal cell carcinoma of skin of nose: Secondary | ICD-10-CM | POA: Diagnosis not present

## 2020-03-31 DIAGNOSIS — Z85828 Personal history of other malignant neoplasm of skin: Secondary | ICD-10-CM | POA: Diagnosis not present

## 2020-04-03 ENCOUNTER — Ambulatory Visit
Admission: RE | Admit: 2020-04-03 | Discharge: 2020-04-03 | Disposition: A | Discharge status: Home / Self Care | Payer: Medicare Other | Source: Ambulatory Visit | Attending: Adult Health | Admitting: Adult Health

## 2020-04-03 ENCOUNTER — Other Ambulatory Visit: Payer: Self-pay

## 2020-04-03 DIAGNOSIS — R928 Other abnormal and inconclusive findings on diagnostic imaging of breast: Secondary | ICD-10-CM

## 2020-04-08 DIAGNOSIS — H25811 Combined forms of age-related cataract, right eye: Secondary | ICD-10-CM | POA: Diagnosis not present

## 2020-04-08 DIAGNOSIS — H2511 Age-related nuclear cataract, right eye: Secondary | ICD-10-CM | POA: Diagnosis not present

## 2020-04-10 ENCOUNTER — Encounter: Payer: Self-pay | Admitting: Adult Health

## 2020-04-10 DIAGNOSIS — Z23 Encounter for immunization: Secondary | ICD-10-CM | POA: Diagnosis not present

## 2020-04-11 ENCOUNTER — Telehealth (INDEPENDENT_AMBULATORY_CARE_PROVIDER_SITE_OTHER): Payer: Medicare Other | Admitting: Family Medicine

## 2020-04-11 ENCOUNTER — Encounter: Payer: Self-pay | Admitting: Family Medicine

## 2020-04-11 VITALS — BP 152/87 | HR 66

## 2020-04-11 DIAGNOSIS — R21 Rash and other nonspecific skin eruption: Secondary | ICD-10-CM | POA: Diagnosis not present

## 2020-04-11 DIAGNOSIS — I6522 Occlusion and stenosis of left carotid artery: Secondary | ICD-10-CM | POA: Diagnosis not present

## 2020-04-11 DIAGNOSIS — I10 Essential (primary) hypertension: Secondary | ICD-10-CM

## 2020-04-11 MED ORDER — LOSARTAN POTASSIUM 50 MG PO TABS: 50.0000 mg | ORAL_TABLET | Freq: Every day | ORAL | 0 refills | Status: DC

## 2020-04-11 NOTE — Progress Notes (Signed)
Subjective:    Patient ID: Hannah Morrow, female    DOB: 19-Jun-1941, 79 y.o.   MRN: [id number]  HPI Virtual Visit via Video Note  I connected with the patient on 04/11/20 at  9:30 AM EDT by a video enabled telemedicine application and verified that I am speaking with the correct person using two identifiers.  Location patient: home Location provider:work or home office Persons participating in the virtual visit: patient, provider  I discussed the limitations of evaluation and management by telemedicine and the availability of in person appointments. The patient expressed understanding and agreed to proceed.   HPI: Here for a rash that she feels is the result of an allergic reaction to Carvedilol. She has a hx of intolerance to a number of BO medications. She has been taking Carvedilol for the past 11 months, and it has worked well for her BP. However about 4 weeks ago she developed a widespread itchy rash that now covers her arms, legs, and trunk. She took a Benadryl last night to help her sleep. No SOB or tongue swelling. She is convinced it is friom the Carvedilol. No recnet changes in soaps, detergents, etc.    ROS: See pertinent positives and negatives per HPI.  Past Medical History:  Diagnosis Date  . Allergy    no meds  . ANXIETY   . Cancer [facility])    cervical cancer - hysterectomy  . GERD   . Hyperlipidemia   . Hypertension   . Hypothyroid   . Plantar fasciitis    right  . RESTLESS LEGS SYNDROME     Past Surgical History:  Procedure Laterality Date  . ABDOMINAL HYSTERECTOMY    . BREAST SURGERY     Bilateral breast reduction  . COLOSTOMY  04-22-2010   [name]  . cone bx     cervix  . CORONARY ARTERY BYPASS GRAFT N/A 05/28/2019   Procedure: CORONARY ARTERY BYPASS GRAFTING (CABG) using the LIMA to LAD; Endoscopic right saphenous vein harvesting to the Ramus and Diag1.;  Surgeon: Grace Isaac, MD;  Location: [place];  Service: Open Heart Surgery;   Laterality: N/A;  . LEFT HEART CATH AND CORONARY ANGIOGRAPHY N/A 05/25/2019   Procedure: LEFT HEART CATH AND CORONARY ANGIOGRAPHY;  Surgeon: Leonie Man, MD;  Location: [place] [facility];  Service: Cardiovascular;  Laterality: N/A;  . REDUCTION MAMMAPLASTY Bilateral   . TEE WITHOUT CARDIOVERSION N/A 05/28/2019   Procedure: TRANSESOPHAGEAL ECHOCARDIOGRAM (TEE);  Surgeon: Grace Isaac, MD;  Location: [place];  Service: Open Heart Surgery;  Laterality: N/A;  . WISDOM TOOTH EXTRACTION      Family History  Problem Relation Age of Onset  . Cancer Sister        melanoma- with mets  . Colon cancer Father 92  . Stomach cancer Maternal Grandfather   . Breast cancer Mother 78  . Breast cancer Daughter 89  . Rectal cancer Neg Hx   . Esophageal cancer Neg Hx      Current Outpatient Medications:  .  Ascorbic Acid (VITAMIN C) 1000 MG tablet, Take 1,000 mg by mouth daily., Disp: , Rfl:  .  aspirin 81 MG tablet, Take 1 tablet (81 mg total) by mouth daily., Disp: , Rfl:  .  b complex vitamins tablet, Take 1 tablet by mouth daily., Disp: , Rfl:  .  calcium carbonate (TUMS - DOSED IN MG ELEMENTAL CALCIUM) 500 MG chewable tablet, Chew 1 tablet by mouth daily as needed for indigestion or  heartburn. , Disp: , Rfl:  .  Cholecalciferol (VITAMIN D) 2000 UNITS CAPS, Take by mouth., Disp: , Rfl:  .  fluocinonide ointment (LIDEX) 2.95 %, Apply 1 application topically daily as needed (itching)., Disp: , Rfl:  .  ibuprofen (ADVIL) 200 MG tablet, Take 400 mg by mouth in the morning and at bedtime., Disp: , Rfl:  .  levothyroxine (SYNTHROID) 100 MCG tablet, Take 1 tablet (100 mcg total) by mouth daily before breakfast., Disp: 90 tablet, Rfl: 3 .  losartan (COZAAR) 50 MG tablet, Take 1 tablet (50 mg total) by mouth daily., Disp: 30 tablet, Rfl: 0 .  simvastatin (ZOCOR) 40 MG tablet, TAKE 1 TABLET AT BEDTIME, Disp: 90 tablet, Rfl: 1  EXAM:  VITALS per patient if applicable:  GENERAL: alert, oriented,  appears well and in no acute distress  HEENT: atraumatic, conjunttiva clear, no obvious abnormalities on inspection of external nose and ears  NECK: normal movements of the head and neck  LUNGS: on inspection no signs of respiratory distress, breathing rate appears normal, no obvious gross SOB, gasping or wheezing  CV: no obvious cyanosis  MS: moves all visible extremities without noticeable abnormality  PSYCH/NEURO: pleasant and cooperative, no obvious depression or anxiety, speech and thought processing grossly intact  ASSESSMENT AND PLAN: Rash, which is possibly represents an allergic reaction to Carvedilol. We will stop this and she will start on Losartan  50 mg daily. Follow up with Hannah Morrow, her PCP, in 3 weeks.  Alysia Penna, MD  Discussed the following assessment and plan:  No diagnosis found.     I discussed the assessment and treatment plan with the patient. The patient was provided an opportunity to ask questions and all were answered. The patient agreed with the plan and demonstrated an understanding of the instructions.   The patient was advised to call back or seek an in-person evaluation if the symptoms worsen or if the condition fails to improve as anticipated.     Review of Systems     Objective:   Physical Exam        Assessment & Plan:

## 2020-04-14 ENCOUNTER — Encounter: Payer: Self-pay | Admitting: Adult Health

## 2020-04-15 MED ORDER — FLUOCINONIDE 0.05 % EX OINT: 1.0000 "application " | TOPICAL_OINTMENT | Freq: Every day | CUTANEOUS | 1 refills | Status: AC | PRN

## 2020-04-21 ENCOUNTER — Encounter: Payer: Self-pay | Admitting: Adult Health

## 2020-04-22 ENCOUNTER — Other Ambulatory Visit: Payer: Self-pay | Admitting: Adult Health

## 2020-04-22 DIAGNOSIS — Z23 Encounter for immunization: Secondary | ICD-10-CM | POA: Diagnosis not present

## 2020-04-22 MED ORDER — LOSARTAN POTASSIUM 100 MG PO TABS: 100.0000 mg | ORAL_TABLET | Freq: Every day | ORAL | 0 refills | Status: DC

## 2020-05-06 DIAGNOSIS — H2512 Age-related nuclear cataract, left eye: Secondary | ICD-10-CM | POA: Diagnosis not present

## 2020-05-06 DIAGNOSIS — H25812 Combined forms of age-related cataract, left eye: Secondary | ICD-10-CM | POA: Diagnosis not present

## 2020-05-08 ENCOUNTER — Other Ambulatory Visit: Payer: Self-pay | Admitting: Adult Health

## 2020-05-08 MED ORDER — LOSARTAN POTASSIUM 100 MG PO TABS: 100.0000 mg | ORAL_TABLET | Freq: Every day | ORAL | 3 refills | Status: DC

## 2020-05-16 ENCOUNTER — Other Ambulatory Visit: Payer: Self-pay | Admitting: Adult Health

## 2020-05-16 DIAGNOSIS — I10 Essential (primary) hypertension: Secondary | ICD-10-CM

## 2020-06-03 ENCOUNTER — Other Ambulatory Visit: Payer: Self-pay | Admitting: Adult Health

## 2020-06-12 ENCOUNTER — Ambulatory Visit (HOSPITAL_COMMUNITY)
Admission: RE | Admit: 2020-06-12 | Discharge: 2020-06-12 | Disposition: A | Discharge status: Home / Self Care | Payer: Medicare Other | Source: Ambulatory Visit | Attending: Cardiology | Admitting: Cardiology

## 2020-06-12 ENCOUNTER — Other Ambulatory Visit: Payer: Self-pay

## 2020-06-12 DIAGNOSIS — R072 Precordial pain: Secondary | ICD-10-CM

## 2020-06-12 DIAGNOSIS — R9431 Abnormal electrocardiogram [ECG] [EKG]: Secondary | ICD-10-CM

## 2020-06-12 DIAGNOSIS — R079 Chest pain, unspecified: Secondary | ICD-10-CM

## 2020-06-12 DIAGNOSIS — R0602 Shortness of breath: Secondary | ICD-10-CM

## 2020-07-22 ENCOUNTER — Other Ambulatory Visit (HOSPITAL_COMMUNITY): Payer: Medicare Other

## 2020-07-23 ENCOUNTER — Telehealth: Payer: Self-pay | Admitting: Adult Health

## 2020-07-23 NOTE — Telephone Encounter (Signed)
Left message for patient to call back and schedule Medicare Annual Wellness Visit (AWV) either virtually or in office.   Last AWV 11/02/17  please schedule at anytime with [facility] Nurse Health Advisor 1 or 2   This should be a 45 minute visit.

## 2020-08-08 DIAGNOSIS — R002 Palpitations: Secondary | ICD-10-CM

## 2020-08-08 NOTE — Telephone Encounter (Signed)
Spoke with patient. She's been having episodes of her heart skipping beats for awhile. States it happens off and on through out the day every day. She states it feels like PVCs. No SOB, CP, dizziness or lightheadedness. Nothing makes the episodes worse or triggers them. HR is running 70-80s. Current HR 85. BP last checked a week or so ago and was running 150/85, 149/75, 148/69, 142/74 and a couple 170s/??. Patient had a cardiac monitor years ago.   Will route to Dr. Gwenlyn Found and nurse Rexanne Mano.

## 2020-08-11 ENCOUNTER — Encounter: Payer: Self-pay | Admitting: *Deleted

## 2020-08-11 ENCOUNTER — Ambulatory Visit (INDEPENDENT_AMBULATORY_CARE_PROVIDER_SITE_OTHER): Payer: Medicare Other

## 2020-08-11 DIAGNOSIS — R002 Palpitations: Secondary | ICD-10-CM

## 2020-08-11 NOTE — Progress Notes (Signed)
Patient ID: Hannah Morrow, female   DOB: 07-01-1941, 80 y.o.   MRN: [id number] Patient enrolled for [employer] to ship a 14 day ZIO XT long term holter monitor to address on record.

## 2020-08-14 DIAGNOSIS — R002 Palpitations: Secondary | ICD-10-CM | POA: Diagnosis not present

## 2020-08-15 LAB — BASIC METABOLIC PANEL
BUN/Creatinine Ratio: 23 (ref 12–28)
BUN: 29 mg/dL — ABNORMAL HIGH (ref 8–27)
CO2: 23 mmol/L (ref 20–29)
Calcium: 10.5 mg/dL — ABNORMAL HIGH (ref 8.7–10.3)
Chloride: 103 mmol/L (ref 96–106)
Creatinine, Ser: 1.26 mg/dL — ABNORMAL HIGH (ref 0.57–1.00)
GFR calc Af Amer: 47 mL/min/{1.73_m2} — ABNORMAL LOW (ref >59)
GFR calc non Af Amer: 41 mL/min/{1.73_m2} — ABNORMAL LOW (ref >59)
Glucose: 96 mg/dL (ref 65–99)
Potassium: 4.8 mmol/L (ref 3.5–5.2)
Sodium: 141 mmol/L (ref 134–144)

## 2020-09-03 DIAGNOSIS — R002 Palpitations: Secondary | ICD-10-CM | POA: Diagnosis not present

## 2020-09-08 ENCOUNTER — Ambulatory Visit (INDEPENDENT_AMBULATORY_CARE_PROVIDER_SITE_OTHER): Payer: Medicare Other | Admitting: Cardiology

## 2020-09-08 ENCOUNTER — Other Ambulatory Visit: Payer: Self-pay

## 2020-09-08 ENCOUNTER — Encounter: Payer: Self-pay | Admitting: Cardiology

## 2020-09-08 VITALS — BP 160/110 | HR 88 | Ht 62.0 in | Wt 196.4 lb

## 2020-09-08 DIAGNOSIS — N183 Chronic kidney disease, stage 3 unspecified: Secondary | ICD-10-CM

## 2020-09-08 DIAGNOSIS — I10 Essential (primary) hypertension: Secondary | ICD-10-CM | POA: Diagnosis not present

## 2020-09-08 DIAGNOSIS — R002 Palpitations: Secondary | ICD-10-CM | POA: Diagnosis not present

## 2020-09-08 DIAGNOSIS — Z951 Presence of aortocoronary bypass graft: Secondary | ICD-10-CM | POA: Diagnosis not present

## 2020-09-08 DIAGNOSIS — E785 Hyperlipidemia, unspecified: Secondary | ICD-10-CM

## 2020-09-08 MED ORDER — ATENOLOL 25 MG PO TABS: 25.0000 mg | ORAL_TABLET | Freq: Every day | ORAL | 3 refills | Status: DC

## 2020-09-08 MED ORDER — ATENOLOL 25 MG PO TABS: 25.0000 mg | ORAL_TABLET | Freq: Two times a day (BID) | ORAL | 3 refills | Status: DC

## 2020-09-08 NOTE — Patient Instructions (Addendum)
Medication Instructions:  START- Atenolol 25 mg by mouth daily  *If you need a refill on your cardiac medications before your next appointment, please call your pharmacy*   Lab Work: None Ordered If you have labs (blood work) drawn today and your tests are completely normal, you will receive your results only by: Marland Kitchen MyChart Message (if you have MyChart) OR . A paper copy in the mail If you have any lab test that is abnormal or we need to change your treatment, we will call you to review the results.   Testing/Procedures: None Ordered   Follow-Up: At [facility], you and your health needs are our priority.  As part of our continuing mission to provide you with exceptional heart care, we have created designated Provider Care Teams.  These Care Teams include your primary Cardiologist (physician) and Advanced Practice Providers (APPs -  Physician Assistants and Nurse Practitioners) who all work together to provide you with the care you need, when you need it.  We recommend signing up for the patient portal called "MyChart".  Sign up information is provided on this After Visit Summary.  MyChart is used to connect with patients for Virtual Visits (Telemedicine).  Patients are able to view lab/test results, encounter notes, upcoming appointments, etc.  Non-urgent messages can be sent to your provider as well.   To learn more about what you can do with MyChart, go to [email or web address].    Your next appointment:   6 month(s)  The format for your next appointment:   In Person  Provider:   You may see Quay Burow, MD or one of the following Advanced Practice Providers on your designated Care Team:    [place], PA-C  Coletta Memos, FNP

## 2020-09-08 NOTE — Progress Notes (Signed)
Cardiology Office Note:    Date:  09/08/2020   ID:  Hannah Morrow, Alferd Apa Jul 06, 1940, MRN [id number]  PCP:  [name], NP  Cardiologist:  No primary care provider on file.  Electrophysiologist:  None   Referring MD: [name], NP   No chief complaint on file. F/U monitor results  History of Present Illness:    Hannah Morrow is a 80 y.o. female with a hx of hypertension, palpitations, and dyslipidemia.  Her husband is followed by Dr. Gwenlyn Found.  She was evaluated for chest pain in Nov 2020.  She was initially set up for coronary CTA but then presented to the office with a clear history of exertional chest pain and an abnormal EKG with T wave inversion.  She was admitted for diagnostic catheterization.  This revealed severe disease including left main disease.  on 05/28/2019 she underwent LIMA to LAD, SVG to the ramus intermedius and a SVG to the diagonal.  She did well postoperatively.  In February 2020 she saw her PCP for an upper respiratory illness.  Her rapid Covid was negative but her 90y/o husband was admitted in February 2020 to [facility] with Covid. He was hospitalized for 3 weeks and apparently developed pulmonary fibrosis.    The patient is in office today after she had called complaining of palpitations tachycardia.  She says this is been an intermittent lifelong issue.  She called the office and was sent a home monitor.  She is here to review the results.  14-day monitor showed sinus rhythm sinus tachycardia short runs of PSVT as well as PVCs and PACs.  There was no documented atrial fibrillation.  The patient has had issues with beta-blockers in the past.  She was unable to tolerate metoprolol, it made her "feel funny, dizzy".  She took carvedilol and developed a rash. She avoids caffeine and OTC decongestants.   Past Medical History:  Diagnosis Date  . Allergy    no meds  . ANXIETY   . Cancer [facility])    cervical cancer - hysterectomy  .  GERD   . Hyperlipidemia   . Hypertension   . Hypothyroid   . Plantar fasciitis    right  . RESTLESS LEGS SYNDROME     Past Surgical History:  Procedure Laterality Date  . ABDOMINAL HYSTERECTOMY    . BREAST SURGERY     Bilateral breast reduction  . COLOSTOMY  04-22-2010   [name]  . cone bx     cervix  . CORONARY ARTERY BYPASS GRAFT N/A 05/28/2019   Procedure: CORONARY ARTERY BYPASS GRAFTING (CABG) using the LIMA to LAD; Endoscopic right saphenous vein harvesting to the Ramus and Diag1.;  Surgeon: Grace Isaac, MD;  Location: [place];  Service: Open Heart Surgery;  Laterality: N/A;  . LEFT HEART CATH AND CORONARY ANGIOGRAPHY N/A 05/25/2019   Procedure: LEFT HEART CATH AND CORONARY ANGIOGRAPHY;  Surgeon: Leonie Man, MD;  Location: [place] CV LAB;  Service: Cardiovascular;  Laterality: N/A;  . REDUCTION MAMMAPLASTY Bilateral   . TEE WITHOUT CARDIOVERSION N/A 05/28/2019   Procedure: TRANSESOPHAGEAL ECHOCARDIOGRAM (TEE);  Surgeon: Grace Isaac, MD;  Location: [place];  Service: Open Heart Surgery;  Laterality: N/A;  . WISDOM TOOTH EXTRACTION      Current Medications: Current Meds  Medication Sig  . Ascorbic Acid (VITAMIN C) 1000 MG tablet Take 1,000 mg by mouth daily.  Marland Kitchen aspirin 81 MG tablet Take 1 tablet (81 mg total) by mouth daily.  Marland Kitchen  atenolol (TENORMIN) 25 MG tablet Take 1 tablet (25 mg total) by mouth 2 (two) times daily.  Marland Kitchen b complex vitamins tablet Take 1 tablet by mouth daily.  . calcium carbonate (TUMS - DOSED IN MG ELEMENTAL CALCIUM) 500 MG chewable tablet Chew 1 tablet by mouth daily as needed for indigestion or heartburn.   . Cholecalciferol (VITAMIN D) 2000 UNITS CAPS Take by mouth.  . fluocinonide ointment (LIDEX) 4.01 % Apply 1 application topically daily as needed (itching).  Marland Kitchen ibuprofen (ADVIL) 200 MG tablet Take 400 mg by mouth in the morning and at bedtime.  Marland Kitchen levothyroxine (SYNTHROID) 100 MCG tablet Take 1 tablet (100 mcg total) by mouth daily  before breakfast.  . simvastatin (ZOCOR) 40 MG tablet TAKE 1 TABLET AT BEDTIME     Allergies:   Penicillins, Coreg [carvedilol], Amlodipine, Metoprolol, Betadine [povidone iodine], Hydrocodone-acetaminophen, Lisinopril, and Tramadol   Social History   Socioeconomic History  . Marital status: Married    Spouse name: Not on file  . Number of children: 2  . Years of education: Not on file  . Highest education level: Not on file  Occupational History  . Not on file  Tobacco Use  . Smoking status: Never Smoker  . Smokeless tobacco: Never Used  Vaping Use  . Vaping Use: Not on file  Substance and Sexual Activity  . Alcohol use: Yes    Alcohol/week: 5.0 standard drinks    Types: 5 Glasses of wine per week    Comment: Occasional  . Drug use: No  . Sexual activity: Yes    Partners: Male    Birth control/protection: Surgical, Other-see comments    Comment: hysterectomy  Other Topics Concern  . Not on file  Social History Narrative   Married for 3 years   Two daughters, five grand children and 5 great grand children. Daughter lives in [place]. Other daughter lives in [place].    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    Exercise Does not exercise. Was going to exercise class but has since quite.    Diet: Veggies, chicken, salmon. Very little red meat. Eats lots of eggs.    Exercise --- walking , gym 3 classes a week   Social Determinants of Health   Financial Resource Strain: Not on file  Food Insecurity: Not on file  Transportation Needs: Not on file  Physical Activity: Not on file  Stress: Not on file  Social Connections: Not on file     Family History: The patient's family history includes Breast cancer (age of onset: 50) in her daughter; Breast cancer (age of onset: 60) in her mother; Cancer in her sister; Colon cancer (age of onset: 35) in her father; Stomach cancer in her maternal grandfather. There is no history of  Rectal cancer or Esophageal cancer.  ROS:   Please see the history of present illness.     All other systems reviewed and are negative.  EKGs/Labs/Other Studies Reviewed:    The following studies were reviewed today: Echo 05/26/2019- IMPRESSIONS    1. Left ventricular ejection fraction, by visual estimation, is 50 to  55%. The left ventricle has normal function. There is no left ventricular  hypertrophy.  2. Apical septal segment and apex are mildly hypokinetic.  3. Left ventricular diastolic parameters are consistent with Grade I  diastolic dysfunction (impaired relaxation).  4. Global right ventricle has normal systolic function.The right  ventricular  size is normal. No increase in right ventricular wall  thickness.  5. Left atrial size was normal.  6. Right atrial size was normal.  7. The mitral valve is normal in structure. No evidence of mitral valve  regurgitation. No evidence of mitral stenosis.  8. The tricuspid valve is normal in structure. Tricuspid valve  regurgitation is not demonstrated.  9. The aortic valve is normal in structure. Aortic valve regurgitation is  not visualized. No evidence of aortic valve sclerosis or stenosis.  10. The pulmonic valve was normal in structure. Pulmonic valve  regurgitation is not visualized.  11. The inferior vena cava is normal in size with greater than 50%  respiratory variability, suggesting right atrial pressure of 3 mmHg.   EKG:  EKG is ordered today.  The ekg ordered today demonstrates NSR, HR 88, poor anterior RW  Recent Labs: 12/18/2019: ALT 19; Hemoglobin 13.3; Platelets 198.0 01/15/2020: TSH 0.77 08/14/2020: BUN 29; Creatinine, Ser 1.26; Potassium 4.8; Sodium 141  Recent Lipid Panel    Component Value Date/Time   CHOL 158 12/18/2019 0952   CHOL 160 09/24/2019 1054   TRIG 155.0 (H) 12/18/2019 0952   TRIG 169 (H) 06/17/2006 0936   HDL 55.10 12/18/2019 0952   HDL 51 09/24/2019 1054   CHOLHDL 3 12/18/2019 0952    VLDL 31.0 12/18/2019 0952   LDLCALC 72 12/18/2019 0952   LDLCALC 77 09/24/2019 1054   LDLDIRECT 107.0 07/26/2017 0939    Physical Exam:    VS:  BP (!) 160/110   Pulse 88   Ht 5\' 2"  (1.575 m)   Wt 196 lb 6.4 oz (89.1 kg)   BMI 35.92 kg/m     Repeat B/P by me with large cuff- 144/86  Wt Readings from Last 3 Encounters:  09/08/20 196 lb 6.4 oz (89.1 kg)  03/07/20 194 lb (88 kg)  02/20/20 195 lb (88.5 kg)     GEN: Well nourished, well developed in no acute distress HEENT: Normal NECK: No JVD; No carotid bruits CARDIAC: RRR, no murmurs, rubs, gallops RESPIRATORY:  Clear to auscultation without rales, wheezing or rhonchi  ABDOMEN: Soft, non-tender, non-distended MUSCULOSKELETAL:  No edema; No deformity  SKIN: Warm and dry NEUROLOGIC:  Alert and oriented x 3 PSYCHIATRIC:  Normal affect   ASSESSMENT:    Palpitations- Documented PSVT.  Pt has a history of beta blocker intolerance, dizziness with Metoprolol, rash with Coreg.  I suggested we try Atenolol 25 mg Q HS.   S/P CABG x 3 CABG x 3 with LIMA-LAD, SVG-RI, SVG-Dx 05/29/2019 Normal LVF pre op by echo  URI (upper respiratory infection) H/O URI Feb 2021- I suspect this was COVID despite negative test (husband admitted with [facility] in Feb),   Essential hypertension Repeat B/P improved- she tells me her home B/P runs in the 130/80 range  CRI (chronic renal insufficiency), stage 3 (moderate) GFR 42  Dyslipidemia, goal LDL below 70 LDL 72 on Zocor 40 mg -   PLAN:    As above add Atenolol 25 mg Q HS.  F/U Dr Gwenlyn Found in 6 months.   Medication Adjustments/Labs and Tests Ordered: Current medicines are reviewed at length with the patient today.  Concerns regarding medicines are outlined above.  Orders Placed This Encounter  Procedures  . EKG 12-Lead   Meds ordered this encounter  Medications  . atenolol (TENORMIN) 25 MG tablet    Sig: Take 1 tablet (25 mg total) by mouth 2 (two) times daily.    Dispense:  30  tablet    Refill:  3    Patient Instructions  Medication Instructions:  START- Atenolol 25 mg by mouth daily  *If you need a refill on your cardiac medications before your next appointment, please call your pharmacy*   Lab Work: None Ordered If you have labs (blood work) drawn today and your tests are completely normal, you will receive your results only by: Marland Kitchen MyChart Message (if you have MyChart) OR . A paper copy in the mail If you have any lab test that is abnormal or we need to change your treatment, we will call you to review the results.   Testing/Procedures: None Ordered   Follow-Up: At [facility], you and your health needs are our priority.  As part of our continuing mission to provide you with exceptional heart care, we have created designated Provider Care Teams.  These Care Teams include your primary Cardiologist (physician) and Advanced Practice Providers (APPs -  Physician Assistants and Nurse Practitioners) who all work together to provide you with the care you need, when you need it.  We recommend signing up for the patient portal called "MyChart".  Sign up information is provided on this After Visit Summary.  MyChart is used to connect with patients for Virtual Visits (Telemedicine).  Patients are able to view lab/test results, encounter notes, upcoming appointments, etc.  Non-urgent messages can be sent to your provider as well.   To learn more about what you can do with MyChart, go to [email or web address].    Your next appointment:   6 month(s)  The format for your next appointment:   In Person  Provider:   You may see Quay Burow, MD or one of the following Advanced Practice Providers on your designated Care Team:    Sande Rives, PA-C  Coletta Memos, FNP         Signed, Kerin Ransom, [place]  09/08/2020 12:05 PM    [place]

## 2020-09-14 DIAGNOSIS — M1712 Unilateral primary osteoarthritis, left knee: Secondary | ICD-10-CM | POA: Diagnosis not present

## 2020-09-14 DIAGNOSIS — M1711 Unilateral primary osteoarthritis, right knee: Secondary | ICD-10-CM | POA: Diagnosis not present

## 2020-09-14 DIAGNOSIS — M545 Low back pain, unspecified: Secondary | ICD-10-CM | POA: Diagnosis not present

## 2020-09-22 MED ORDER — ATENOLOL 25 MG PO TABS: 25.0000 mg | ORAL_TABLET | Freq: Every day | ORAL | 3 refills | Status: DC

## 2020-09-24 ENCOUNTER — Telehealth: Payer: Self-pay | Admitting: Cardiovascular Disease

## 2020-09-24 NOTE — Telephone Encounter (Signed)
Pt c/o BP issue: STAT if pt c/o blurred vision, one-sided weakness or slurred speech  1. What are your last 5 BP readings? 170/99 today  2. Are you having any other symptoms (ex. Dizziness, headache, blurred vision, passed out)? No symptoms  3. What is your BP issue? Beth, NP, Home Health calling to inform the patient's BP today was 170/99. She states it looks like this may be her base line, but wants to make sure. She states the patient has also refused to see nephrology. Phone: [phone]

## 2020-09-24 NOTE — Telephone Encounter (Signed)
Spoke with Beth from [facility] who report pt's BP today was 170/99. She state pt is asymptomatic but wanted to report reading. She is unsure if this is pt's baseline,  but noticed last OV on 3/7 with Kerin Ransom, pt's BP was 160/110. She also report pt was referred to nephrologist by pcp to evaluate renal arteries, but pt refused.  Will route to MD for recommendations.

## 2020-09-25 NOTE — Telephone Encounter (Signed)
Please have her come in to see a Pharm D tomorrow

## 2020-09-25 NOTE — Telephone Encounter (Signed)
I am not here tomorrow and I know we will 2 people out as well.  Are you able to see this person tomorrow as requested by Dr Gwenlyn Found?

## 2020-09-26 NOTE — Telephone Encounter (Signed)
Spoke to patient.  She states that her pressure has come back down to her "normal" range, and was 127/77 this morning.  She is feeling fine, no concerns.  Advised her to monitor sodium intake, as that could have been reason for spike.  Patient voiced understanding and will call back if any other concerns.

## 2020-10-03 MED ORDER — ATENOLOL 25 MG PO TABS: 25.0000 mg | ORAL_TABLET | Freq: Every day | ORAL | 3 refills | Status: DC

## 2020-11-20 IMAGING — MG DIGITAL DIAGNOSTIC BILAT W/ TOMO W/ CAD
8 series · 8 of 24 positions shown · non-contrast
Comparison: Previous exam(s).

CLINICAL DATA: 79-year-old female presenting for annual exam as
well as short-term follow-up of probably benign postsurgical changes
in the left breast.

EXAM:
DIGITAL DIAGNOSTIC BILATERAL MAMMOGRAM WITH TOMO AND CAD

[L MLO synth-2D]
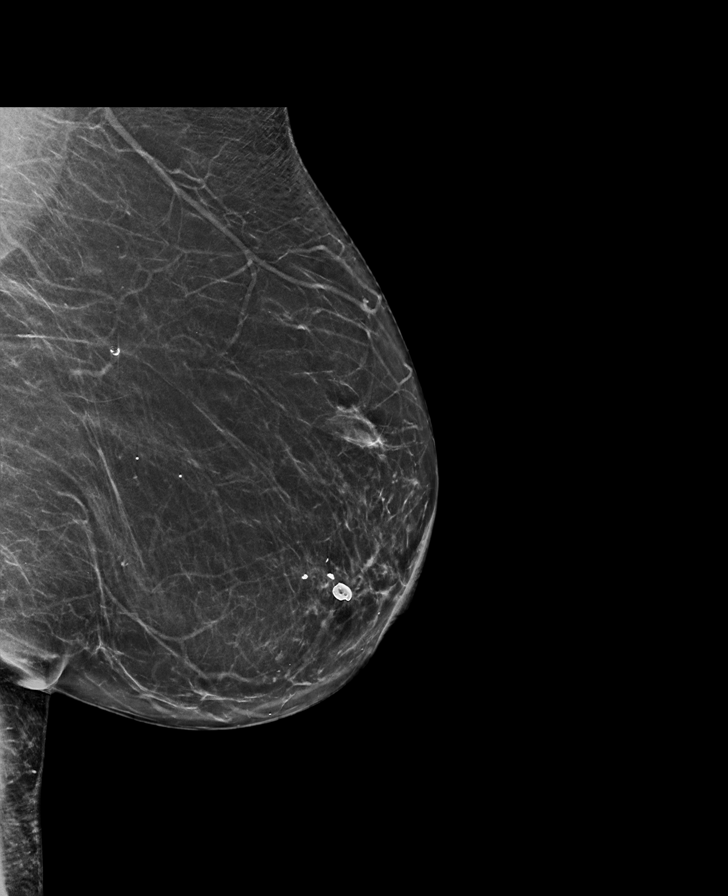

[R CC synth-2D]
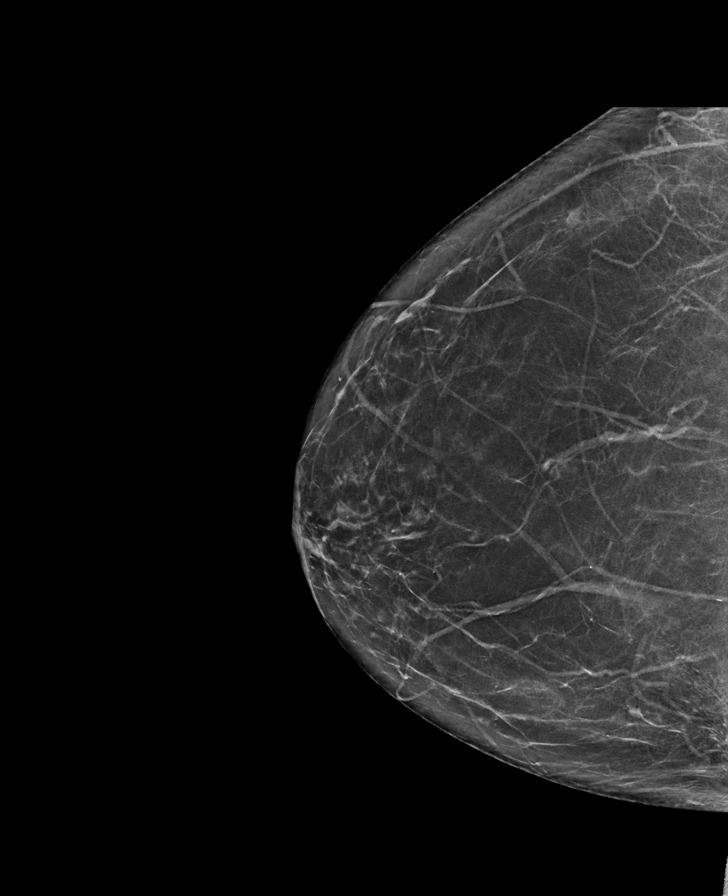

[R MLO synth-2D]
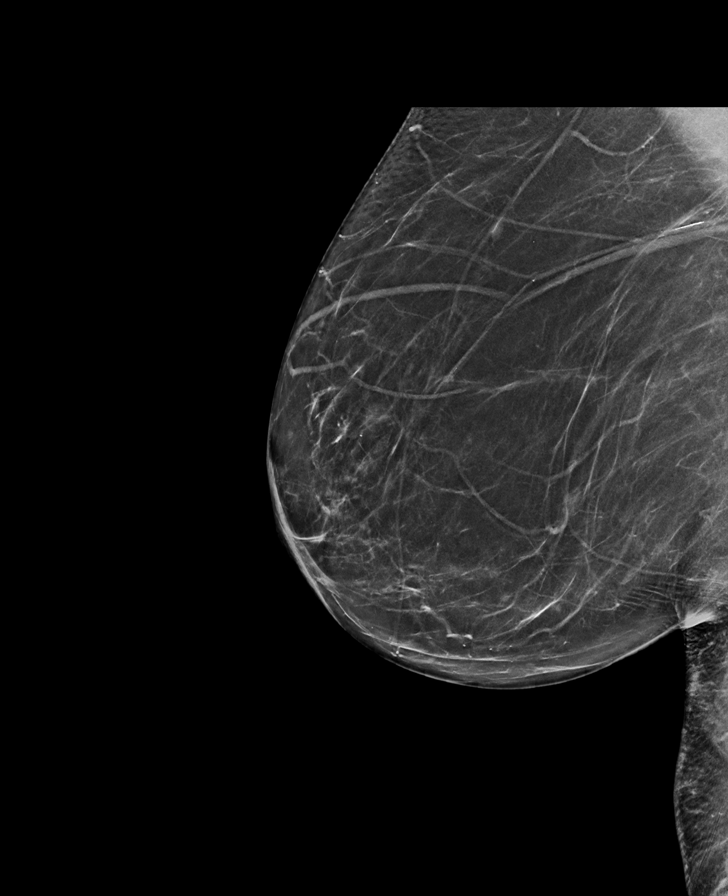

[L CC synth-2D]
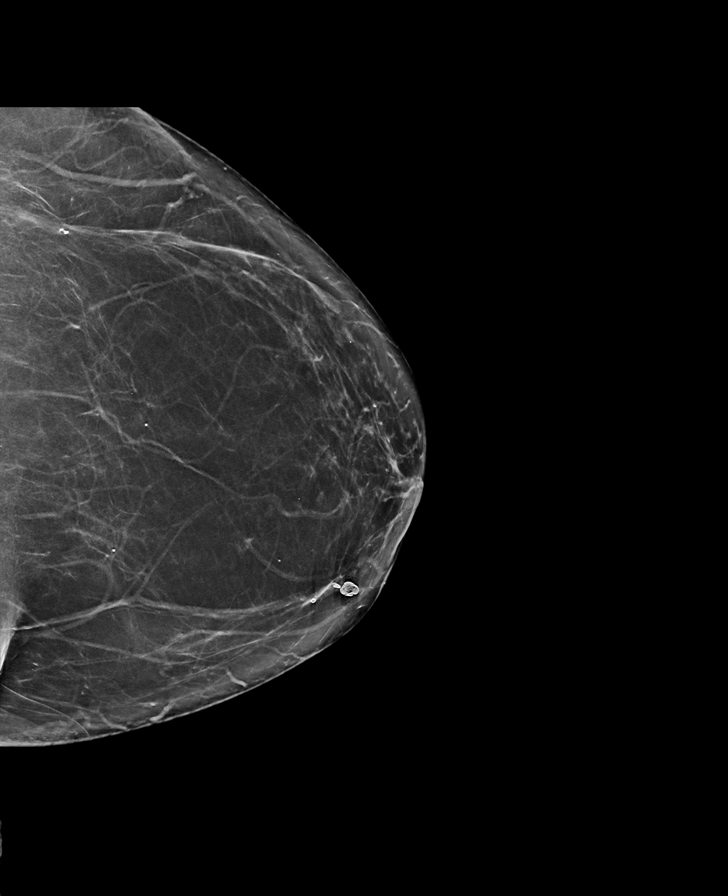

[R CC tomo · tomo slice 33/66.0]
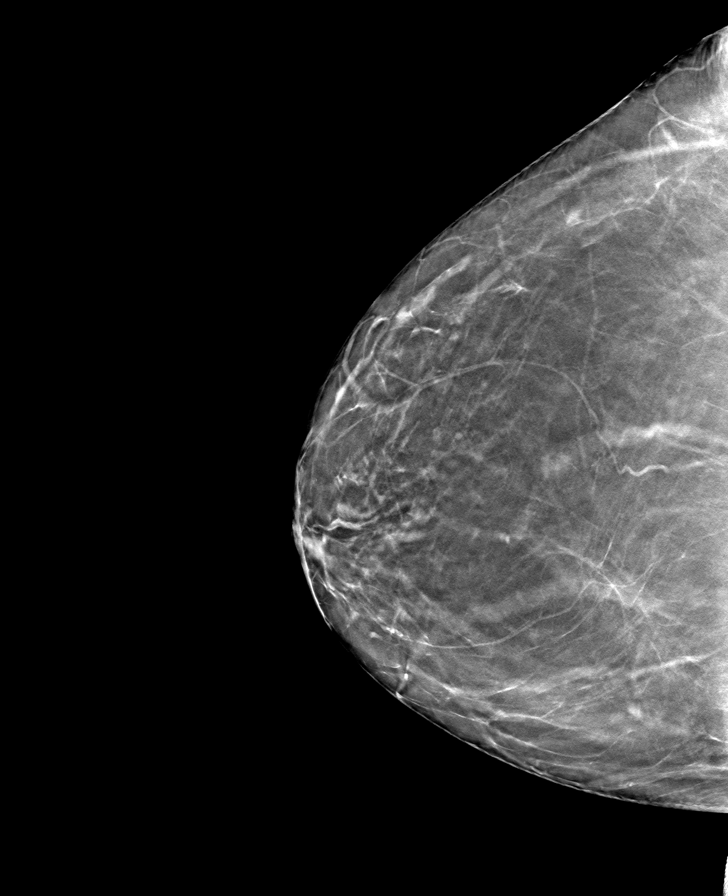

[L CC tomo · tomo slice 36/71.0]
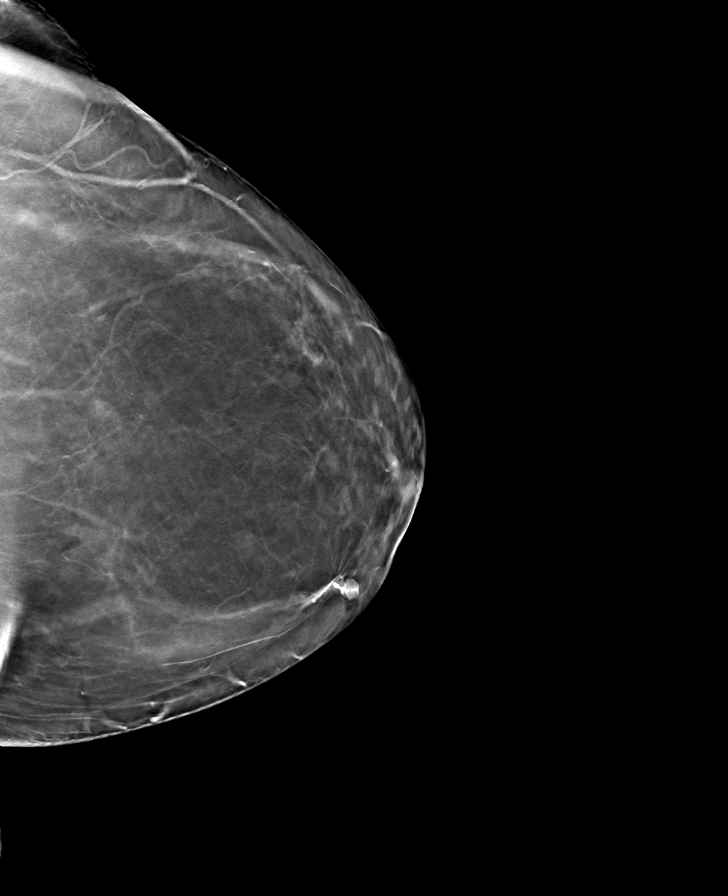

[L MLO tomo · tomo slice 35/70.0]
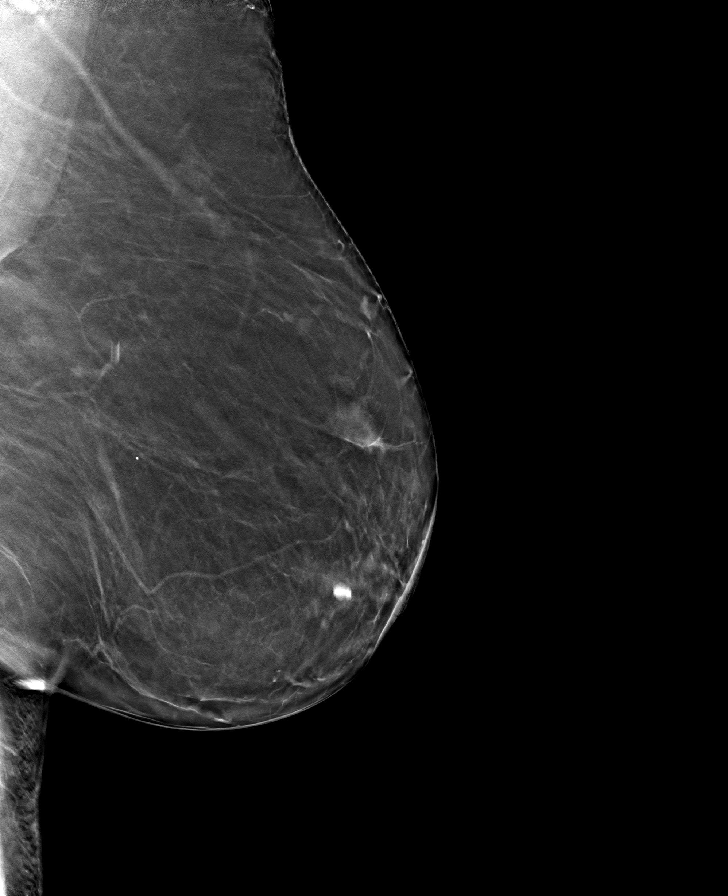

[R MLO tomo · tomo slice 35/69.0]
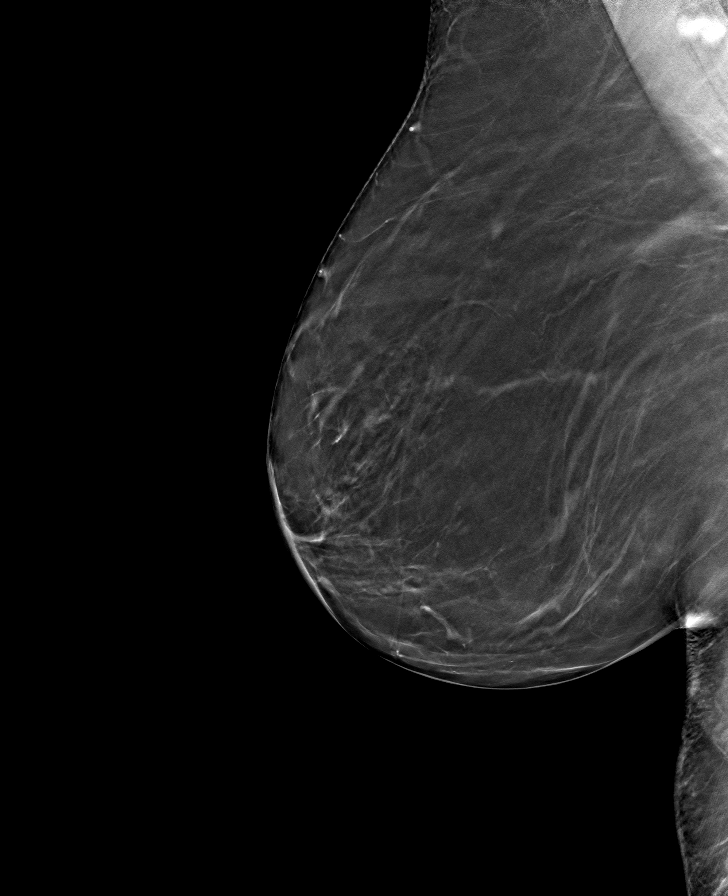

[8 of 24 positions shown; findings below may reference images not displayed]

ACR Breast Density Category b: There are scattered areas of
fibroglandular density.
FINDINGS: Right breast: No suspicious mass, distortion, or microcalcifications
are identified to suggest presence of malignancy.

Left breast: There is a stable asymmetry with dystrophic
calcifications in the far outer and posterior left breast, most
likely representing postsurgical changes from prior reduction
surgery. There are no new suspicious findings in the left breast.

Mammographic images were processed with CAD.
IMPRESSION: Stable probably benign asymmetry in the outer left breast, likely
representing postsurgical changes. No new suspicious finding in the
bilateral breasts.

RECOMMENDATION:
Diagnostic mammogram is suggested in 1 year. (Code:[id number])

I have discussed the findings and recommendations with the patient.
If applicable, a reminder letter will be sent to the patient
regarding the next appointment.

BI-RADS CATEGORY  3: Probably benign.

## 2020-12-01 ENCOUNTER — Other Ambulatory Visit: Payer: Self-pay | Admitting: Adult Health

## 2020-12-09 ENCOUNTER — Telehealth: Payer: Self-pay | Admitting: *Deleted

## 2020-12-09 NOTE — Chronic Care Management (AMB) (Signed)
  Chronic Care Management   Outreach Note  12/09/2020 Name: Hannah Morrow MRN: [id number] DOB: Sep 13, 1940  Hannah Morrow is a 80 y.o. year old female who is a primary care patient of [name], NP. I reached out to Hannah Morrow by phone today in response to a referral sent by Ms. Hannah Morrow's PCP, [name], NP.     An unsuccessful telephone outreach was attempted today. The patient was referred to the case management team for assistance with care management and care coordination.   Follow Up Plan: A HIPAA compliant phone message was left for the patient providing contact information and requesting a return call. The care management team will reach out to the patient again over the next 7 days. If patient returns call to provider office, please advise to call [facility] at [phone].  [facility]

## 2020-12-10 NOTE — Chronic Care Management (AMB) (Signed)
  Chronic Care Management   Note  12/10/2020 Name: Hannah Morrow MRN: [id number] DOB: 10/03/1940  Hannah Morrow is a 80 y.o. year old female who is a primary care patient of [name], NP. I reached out to [name] by phone today in response to a referral sent by Hannah Morrow's PCP, [name], NP.      Hannah Morrow was given information about Chronic Care Management services today including:  1. CCM service includes personalized support from designated clinical staff supervised by her physician, including individualized plan of care and coordination with other care providers 2. 24/7 contact phone numbers for assistance for urgent and routine care needs. 3. Service will only be billed when office clinical staff spend 20 minutes or more in a month to coordinate care. 4. Only one practitioner may furnish and bill the service in a calendar month. 5. The patient may stop CCM services at any time (effective at the end of the month) by phone call to the office staff. 6. The patient will be responsible for cost sharing (co-pay) of up to 20% of the service fee (after annual deductible is met).  Patient agreed to services and verbal consent obtained.   Follow up plan: Telephone appointment with care management team member scheduled for:12/18/2020  [facility]

## 2020-12-18 ENCOUNTER — Ambulatory Visit (INDEPENDENT_AMBULATORY_CARE_PROVIDER_SITE_OTHER): Payer: Medicare Other

## 2020-12-18 DIAGNOSIS — E782 Mixed hyperlipidemia: Secondary | ICD-10-CM | POA: Diagnosis not present

## 2020-12-18 DIAGNOSIS — R079 Chest pain, unspecified: Secondary | ICD-10-CM

## 2020-12-18 DIAGNOSIS — I10 Essential (primary) hypertension: Secondary | ICD-10-CM

## 2020-12-18 NOTE — Chronic Care Management (AMB) (Signed)
Chronic Care Management   CCM RN Visit Note  12/18/2020 Name: Hannah Morrow MRN: [id number] DOB: [birth year]  Subjective: Hannah Morrow is a 80 y.o. year old female who is a primary care patient of [name], NP. The care management team was consulted for assistance with disease management and care coordination needs.    Engaged with patient by telephone for initial visit in response to provider referral for case management and/or care coordination services.   Consent to Services:  The patient was given the following information about Chronic Care Management services today, agreed to services, and gave verbal consent: 1. CCM service includes personalized support from designated clinical staff supervised by the primary care provider, including individualized plan of care and coordination with other care providers 2. 24/7 contact phone numbers for assistance for urgent and routine care needs. 3. Service will only be billed when office clinical staff spend 20 minutes or more in a month to coordinate care. 4. Only one practitioner may furnish and bill the service in a calendar month. 5.The patient may stop CCM services at any time (effective at the end of the month) by phone call to the office staff. 6. The patient will be responsible for cost sharing (co-pay) of up to 20% of the service fee (after annual deductible is met). Patient agreed to services and consent obtained.  Patient agreed to services and verbal consent obtained.   Assessment: Review of patient past medical history, allergies, medications, health status, including review of consultants reports, laboratory and other test data, was performed as part of comprehensive evaluation and provision of chronic care management services.   SDOH (Social Determinants of Health) assessments and interventions performed:  SDOH Interventions    Flowsheet Row Most Recent Value  SDOH Interventions   Food Insecurity  Interventions Intervention Not Indicated  Financial Strain Interventions Intervention Not Indicated  Housing Interventions Intervention Not Indicated  Physical Activity Interventions Intervention Not Indicated  Stress Interventions Patient Refused  [delines LCSW at this time]  Transportation Interventions Intervention Not Indicated        CCM Care Plan  Allergies  Allergen Reactions   Penicillins Shortness Of Breath    red face Did it involve swelling of the face/tongue/throat, SOB, or low BP? Yes Did it involve sudden or severe rash/hives, skin peeling, or any reaction on the inside of your mouth or nose? Yes Did you need to seek medical attention at a hospital or doctor's office? Yes When did it last happen?      40+ years If all above answers are "NO", may proceed with cephalosporin use.    Coreg [Carvedilol] Rash   Amlodipine     Pt states it made her feel "awful and weird."   Metoprolol Other (See Comments)    " feel weird"   Betadine [Povidone Iodine] Rash   Hydrocodone-Acetaminophen Rash    ras   Lisinopril     Fatigue    Tramadol Itching and Rash    Outpatient Encounter Medications as of 12/18/2020  Medication Sig   Ascorbic Acid (VITAMIN C) 1000 MG tablet Take 1,000 mg by mouth daily.   aspirin 81 MG tablet Take 1 tablet (81 mg total) by mouth daily.   atenolol (TENORMIN) 25 MG tablet Take 1 tablet (25 mg total) by mouth daily.   b complex vitamins tablet Take 1 tablet by mouth daily.   calcium carbonate (TUMS - DOSED IN MG ELEMENTAL CALCIUM) 500 MG chewable tablet Chew 1 tablet by mouth daily  as needed for indigestion or heartburn.    Cholecalciferol (VITAMIN D) 2000 UNITS CAPS Take by mouth.   fluocinonide ointment (LIDEX) 8.97 % Apply 1 application topically daily as needed (itching).   ibuprofen (ADVIL) 200 MG tablet Take 400 mg by mouth in the morning and at bedtime.   levothyroxine (SYNTHROID) 100 MCG tablet TAKE 1 TABLET DAILY BEFORE BREAKFAST   losartan  (COZAAR) 100 MG tablet Take 1 tablet (100 mg total) by mouth daily.   simvastatin (ZOCOR) 40 MG tablet TAKE 1 TABLET AT BEDTIME   No facility-administered encounter medications on file as of 12/18/2020.    Patient Active Problem List   Diagnosis Date Noted   URI (upper respiratory infection) 10/02/2019   CRI (chronic renal insufficiency), stage 3 (moderate) (HCC) 10/02/2019   Carotid artery disease ([place]) 06/13/2019   S/P CABG x 3 05/29/2019   Progressive angina ([place]) 05/25/2019   Abnormal EKG 05/23/2019   BACK PAIN 08/26/2010   Vitamin D deficiency 09/04/2009   GERD 09/04/2009   FATIGUE 08/29/2008   RESTLESS LEGS SYNDROME 09/28/2007   INSOMNIA 09/28/2007   PALPITATIONS 09/28/2007   Hypothyroidism 08/10/2007   Dyslipidemia, goal LDL below 70 08/10/2007   ANXIETY 08/10/2007   Essential hypertension 08/10/2007   ASTHMA 08/10/2007    Conditions to be addressed/monitored:CAD, HTN, and HLD  Care Plan : RNCM:Cardiovascular disease(HTN, HLD, CAD)  Updates made by Dimitri Ped, RN since 12/18/2020 12:00 AM     Problem: Lack of long term management of Cardiovascular disease(HTN, HLD, CAD)   Priority: High     Long-Range Goal: Effective self management of Cardiovascular disease(HTN, HLD, CAD)   Start Date: 12/18/2020  Expected End Date: 06/04/2021  This Visit's Progress: On track  Priority: High  Note:   Objective:  Last practice recorded BP readings:  BP Readings from Last 3 Encounters:  09/08/20 (!) 160/110  04/11/20 (!) 152/87  03/07/20 140/78   Most recent eGFR/CrCl: No results found for: EGFR  No components found for: CRCL Current Barriers:  Knowledge Deficits related to basic understanding of hypertension pathophysiology and self care management of Cardiovascular disease(HTN, HLD, CAD) Knowledge Deficits related to understanding of medications prescribed for management of hypertension Unable to independently self manage Cardiovascular disease(HTN, HLD, CAD) Does  not adhere to provider recommendations re: diet and exercise Pt states that she tries to check her B/P once a week or so.  States that last reading was 112/69.  States her readings have been better since she started taking the atenolol.  Denies any recent chest pains or shortness of breath. States she does like salt on her food but she has been trying to cook using spices and seasonings.  States she does walk her dog 1-2 miles a day but no other exercise States she is under more stress at this time as her husband is now under hospice care.  States the Education officer, museum at hospice can help her if needed for stress management Case Manager Clinical Goal(s):  patient will verbalize understanding of plan for hypertension management patient will attend all scheduled medical appointments: Annual Wellness visit 12/25/20, cardiology Dr.Berry 03/10/21 patient will demonstrate improved health management independence as evidenced by checking blood pressure as directed and notifying PCP if SBP>160 or DBP > 90, taking all medications as prescribe, and adhering to a low sodium diet as discussed. Interventions:  Collaboration with [name], NP regarding development and update of comprehensive plan of care as evidenced by provider attestation and co-signature Inter-disciplinary care team collaboration (  see longitudinal plan of care) Evaluation of current treatment plan related to hypertension self management and patient's adherence to plan as established by provider. Provided education to patient re: stroke prevention, s/s of heart attack and stroke, DASH diet, complications of uncontrolled blood pressure Reviewed medications with patient and discussed importance of compliance Discussed plans with patient for ongoing care management follow up and provided patient with direct contact information for care management team Advised patient, providing education and rationale, to monitor blood pressure daily and record, calling  PCP for findings outside established parameters.  Reviewed scheduled/upcoming provider appointments including: Annual Wellness visit 12/25/20, cardiology Dr.Berry 03/10/21 Care guide referral to give resources to have smoke detectors changed or new batteries placed Declines CM LCSW referral at this time for stress of caring for husband as she has hospice resources at this time.  Instructed to call RNCM if her needs change Self-Care Activities: - Self administers medications as prescribed Attends all scheduled provider appointments Calls provider office for new concerns, questions, or BP outside discussed parameters Checks BP and records as discussed Follows a low sodium diet/DASH diet Patient Goals: - check blood pressure 3 times per week - choose a place to take my blood pressure (home, clinic or office, retail store) - write blood pressure results in a log or diary - learn about high blood pressure - be open to making changes - I can manage, know and watch for signs of a heart attack - if I have chest pain, call for help - learn about small changes that will make a big difference - learn my personal risk factors  Follow Up Plan: Telephone follow up appointment with care management team member scheduled for: 01/16/21 at 11:30 AM The patient has been provided with contact information for the care management team and has been advised to call with any health related questions or concerns.       Plan:Telephone follow up appointment with care management team member scheduled for:  01/16/21 and The patient has been provided with contact information for the care management team and has been advised to call with any health related questions or concerns.  Peter Garter RN, Jackquline Denmark, CDE Care Management Coordinator [facility] [phone], Mobile [phone]

## 2020-12-18 NOTE — Patient Instructions (Signed)
Visit Information   PATIENT GOALS:   Goals Addressed             This Visit's Progress    RNCM:Improve My Heart Health-Coronary Artery Disease   On track    Timeframe:  Long-Range Goal Priority:  Medium Start Date:   12/18/20                          Expected End Date:     06/04/21                  Follow Up Date 01/16/21    - be open to making changes - I can manage, know and watch for signs of a heart attack - if I have chest pain, call for help - learn about small changes that will make a big difference - learn my personal risk factors    Why is this important?   Lifestyle changes are key to improving the blood flow to your heart. Think about the things you can change and set a goal to live healthy.  Remember, when the blood vessels to your heart start to get clogged you may not have any symptoms.  Over time, they can get worse.  Don't ignore the signs, like chest pain, and get help right away.     Notes:       RNCM:Keep My Home Safe   On track    Timeframe:  Long-Range Goal Priority:  Medium Start Date:     12/18/20                        Expected End Date:       03/04/21                 Follow Up Date 01/16/21    - change batteries in smoke and carbon monoxide detectors - identify trip and fall hazards - install smoke and carbon monoxide detectors - keep emergency phone numbers nearby - practice a family fire evacuation plan - repair or remove trip and fall hazards    Why is this important?   Accidents in and near your home can cause serious injury.  Take steps to avoid them.    Notes:       RNCM:Track and Manage My Blood Pressure-Hypertension   On track    Timeframe:  Long-Range Goal Priority:  High Start Date:       12/08/20                      Expected End Date:   06/04/21                    Follow Up Date 01/16/21    - check blood pressure 3 times per week - choose a place to take my blood pressure (home, clinic or office, retail store) - write  blood pressure results in a log or diary    Why is this important?   You won't feel high blood pressure, but it can still hurt your blood vessels.  High blood pressure can cause heart or kidney problems. It can also cause a stroke.  Making lifestyle changes like losing a little weight or eating less salt will help.  Checking your blood pressure at home and at different times of the day can help to control blood pressure.  If the doctor prescribes medicine remember to take it  the way the doctor ordered.  Call the office if you cannot afford the medicine or if there are questions about it.     Notes:          Consent to CCM Services: Ms. Revard was given information about Chronic Care Management services today including:  CCM service includes personalized support from designated clinical staff supervised by her physician, including individualized plan of care and coordination with other care providers 24/7 contact phone numbers for assistance for urgent and routine care needs. Service will only be billed when office clinical staff spend 20 minutes or more in a month to coordinate care. Only one practitioner may furnish and bill the service in a calendar month. The patient may stop CCM services at any time (effective at the end of the month) by phone call to the office staff. The patient will be responsible for cost sharing (co-pay) of up to 20% of the service fee (after annual deductible is met).  Patient agreed to services and verbal consent obtained.   Patient verbalizes understanding of instructions provided today and agrees to view in [place].   Telephone follow up appointment with care management team member scheduled for: 01/16/21 at 11:30 AM  Peter Garter RN, BSN,CCM, CDE Care Management Coordinator [facility] [phone], Mobile [phone]   CLINICAL CARE PLAN: Patient Care Plan: RNCM:Cardiovascular disease(HTN, HLD, CAD)     Problem  Identified: Lack of long term management of Cardiovascular disease(HTN, HLD, CAD)   Priority: High     Long-Range Goal: Effective self management of Cardiovascular disease(HTN, HLD, CAD)   Start Date: 12/18/2020  Expected End Date: 06/04/2021  This Visit's Progress: On track  Priority: High  Note:   Objective:  Last practice recorded BP readings:  BP Readings from Last 3 Encounters:  09/08/20 (!) 160/110  04/11/20 (!) 152/87  03/07/20 140/78   Most recent eGFR/CrCl: No results found for: EGFR  No components found for: CRCL Current Barriers:  Knowledge Deficits related to basic understanding of hypertension pathophysiology and self care management of Cardiovascular disease(HTN, HLD, CAD) Knowledge Deficits related to understanding of medications prescribed for management of hypertension Unable to independently self manage Cardiovascular disease(HTN, HLD, CAD) Does not adhere to provider recommendations re: diet and exercise Pt states that she tries to check her B/P once a week or so.  States that last reading was 112/69.  States her readings have been better since she started taking the atenolol.  Denies any recent chest pains or shortness of breath. States she does like salt on her food but she has been trying to cook using spices and seasonings.  States she does walk her dog 1-2 miles a day but no other exercise States she is under more stress at this time as her husband is now under hospice care.  States the Education officer, museum at hospice can help her if needed for stress management Case Manager Clinical Goal(s):  patient will verbalize understanding of plan for hypertension management patient will attend all scheduled medical appointments: Annual Wellness visit 12/25/20, cardiology Dr.Berry 03/10/21 patient will demonstrate improved health management independence as evidenced by checking blood pressure as directed and notifying PCP if SBP>160 or DBP > 90, taking all medications as prescribe, and  adhering to a low sodium diet as discussed. Interventions:  Collaboration with [name], NP regarding development and update of comprehensive plan of care as evidenced by provider attestation and co-signature Inter-disciplinary care team collaboration (see longitudinal plan of care) Evaluation of current treatment  plan related to hypertension self management and patient's adherence to plan as established by provider. Provided education to patient re: stroke prevention, s/s of heart attack and stroke, DASH diet, complications of uncontrolled blood pressure Reviewed medications with patient and discussed importance of compliance Discussed plans with patient for ongoing care management follow up and provided patient with direct contact information for care management team Advised patient, providing education and rationale, to monitor blood pressure daily and record, calling PCP for findings outside established parameters.  Reviewed scheduled/upcoming provider appointments including: Annual Wellness visit 12/25/20, cardiology Dr.Berry 03/10/21 Care guide referral to give resources to have smoke detectors changed or new batteries placed Declines CM LCSW referral at this time for stress of caring for husband as she has hospice resources at this time.  Instructed to call RNCM if her needs change Self-Care Activities: - Self administers medications as prescribed Attends all scheduled provider appointments Calls provider office for new concerns, questions, or BP outside discussed parameters Checks BP and records as discussed Follows a low sodium diet/DASH diet Patient Goals: - check blood pressure 3 times per week - choose a place to take my blood pressure (home, clinic or office, retail store) - write blood pressure results in a log or diary - learn about high blood pressure - be open to making changes - I can manage, know and watch for signs of a heart attack - if I have chest pain, call for  help - learn about small changes that will make a big difference - learn my personal risk factors  Follow Up Plan: Telephone follow up appointment with care management team member scheduled for: 01/16/21 at 11:30 AM The patient has been provided with contact information for the care management team and has been advised to call with any health related questions or concerns.

## 2020-12-19 ENCOUNTER — Telehealth: Payer: Self-pay

## 2020-12-19 NOTE — Telephone Encounter (Signed)
Telephone encounter was:  Successful.  12/19/2020 Name: Hannah Morrow MRN: [id number] DOB: 04-06-1941  Hannah Morrow is a 80 y.o. year old female who is a primary care patient of Shirline Frees, NP . The community resource team was consulted for assistance with  smoke detector  Care guide performed the following interventions: Spoke with patient gave her the information to contact the [employer] office-[phone].  Let patient know there is no charge for this service. She stated that she would call next week and did not need further assistance at this time.  Follow Up Plan:  No further follow up planned at this time. The patient has been provided with needed resources.  Carleton Vanvalkenburgh, AAS Paralegal, [facility] Care Guide  Embedded Care Coordination [place]  [facility]  [address] [place], [place] [address] ??[name]  ?? [phone]   [email or web address]

## 2020-12-25 ENCOUNTER — Ambulatory Visit (INDEPENDENT_AMBULATORY_CARE_PROVIDER_SITE_OTHER): Payer: Medicare Other

## 2020-12-25 ENCOUNTER — Other Ambulatory Visit: Payer: Self-pay

## 2020-12-25 ENCOUNTER — Ambulatory Visit: Payer: Medicare Other

## 2020-12-25 VITALS — BP 134/64 | HR 67 | Temp 97.6°F | Ht 62.0 in | Wt 193.0 lb

## 2020-12-25 DIAGNOSIS — Z0000 Encounter for general adult medical examination without abnormal findings: Secondary | ICD-10-CM

## 2020-12-25 DIAGNOSIS — Z780 Asymptomatic menopausal state: Secondary | ICD-10-CM

## 2020-12-25 NOTE — Patient Instructions (Signed)
Hannah Morrow , Thank you for taking time to come for your Medicare Wellness Visit. I appreciate your ongoing commitment to your health goals. Please review the following plan we discussed and let me know if I can assist you in the future.   Screening recommendations/referrals: Colonoscopy: no longer required Mammogram: 04/03/2020 Bone Density: 12/14/2012  referral completed 12/25/2020 Recommended yearly ophthalmology/optometry visit for glaucoma screening and checkup Recommended yearly dental visit for hygiene and checkup  Vaccinations: Influenza vaccine: due in fall 2022 Pneumococcal vaccine: completed series Tdap vaccine: due with injury  Shingles vaccine: will obtain local pharmacy   Advanced directives: will provide copies   Conditions/risks identified: none   Next appointment: 01/30/2021  CPE @100pm  with [name]    Preventive Care 79 Years and Older, Female Preventive care refers to lifestyle choices and visits with your health care provider that can promote health and wellness. What does preventive care include? A yearly physical exam. This is also called an annual well check. Dental exams once or twice a year. Routine eye exams. Ask your health care provider how often you should have your eyes checked. Personal lifestyle choices, including: Daily care of your teeth and gums. Regular physical activity. Eating a healthy diet. Avoiding tobacco and drug use. Limiting alcohol use. Practicing safe sex. Taking low-dose aspirin every day. Taking vitamin and mineral supplements as recommended by your health care provider. What happens during an annual well check? The services and screenings done by your health care provider during your annual well check will depend on your age, overall health, lifestyle risk factors, and family history of disease. Counseling  Your health care provider may ask you questions about your: Alcohol use. Tobacco use. Drug use. Emotional  well-being. Home and relationship well-being. Sexual activity. Eating habits. History of falls. Memory and ability to understand (cognition). Work and work Statistician. Reproductive health. Screening  You may have the following tests or measurements: Height, weight, and BMI. Blood pressure. Lipid and cholesterol levels. These may be checked every 5 years, or more frequently if you are over 33 years old. Skin check. Lung cancer screening. You may have this screening every year starting at age 71 if you have a 30-pack-year history of smoking and currently smoke or have quit within the past 15 years. Fecal occult blood test (FOBT) of the stool. You may have this test every year starting at age 34. Flexible sigmoidoscopy or colonoscopy. You may have a sigmoidoscopy every 5 years or a colonoscopy every 10 years starting at age 68. Hepatitis C blood test. Hepatitis B blood test. Sexually transmitted disease (STD) testing. Diabetes screening. This is done by checking your blood sugar (glucose) after you have not eaten for a while (fasting). You may have this done every 1-3 years. Bone density scan. This is done to screen for osteoporosis. You may have this done starting at age 19. Mammogram. This may be done every 1-2 years. Talk to your health care provider about how often you should have regular mammograms. Talk with your health care provider about your test results, treatment options, and if necessary, the need for more tests. Vaccines  Your health care provider may recommend certain vaccines, such as: Influenza vaccine. This is recommended every year. Tetanus, diphtheria, and acellular pertussis (Tdap, Td) vaccine. You may need a Td booster every 10 years. Zoster vaccine. You may need this after age 50. Pneumococcal 13-valent conjugate (PCV13) vaccine. One dose is recommended after age 59. Pneumococcal polysaccharide (PPSV23) vaccine. One dose is recommended  after age 32. Talk to your  health care provider about which screenings and vaccines you need and how often you need them. This information is not intended to replace advice given to you by your health care provider. Make sure you discuss any questions you have with your health care provider. Document Released: 07/18/2015 Document Revised: 03/10/2016 Document Reviewed: 04/22/2015 Elsevier Interactive Patient Education  2017 [place] Prevention in the Home Falls can cause injuries. They can happen to people of all ages. There are many things you can do to make your home safe and to help prevent falls. What can I do on the outside of my home? Regularly fix the edges of walkways and driveways and fix any cracks. Remove anything that might make you trip as you walk through a door, such as a raised step or threshold. Trim any bushes or trees on the path to your home. Use bright outdoor lighting. Clear any walking paths of anything that might make someone trip, such as rocks or tools. Regularly check to see if handrails are loose or broken. Make sure that both sides of any steps have handrails. Any raised decks and porches should have guardrails on the edges. Have any leaves, snow, or ice cleared regularly. Use sand or salt on walking paths during winter. Clean up any spills in your garage right away. This includes oil or grease spills. What can I do in the bathroom? Use night lights. Install grab bars by the toilet and in the tub and shower. Do not use towel bars as grab bars. Use non-skid mats or decals in the tub or shower. If you need to sit down in the shower, use a plastic, non-slip stool. Keep the floor dry. Clean up any water that spills on the floor as soon as it happens. Remove soap buildup in the tub or shower regularly. Attach bath mats securely with double-sided non-slip rug tape. Do not have throw rugs and other things on the floor that can make you trip. What can I do in the bedroom? Use night  lights. Make sure that you have a light by your bed that is easy to reach. Do not use any sheets or blankets that are too big for your bed. They should not hang down onto the floor. Have a firm chair that has side arms. You can use this for support while you get dressed. Do not have throw rugs and other things on the floor that can make you trip. What can I do in the kitchen? Clean up any spills right away. Avoid walking on wet floors. Keep items that you use a lot in easy-to-reach places. If you need to reach something above you, use a strong step stool that has a grab bar. Keep electrical cords out of the way. Do not use floor polish or wax that makes floors slippery. If you must use wax, use non-skid floor wax. Do not have throw rugs and other things on the floor that can make you trip. What can I do with my stairs? Do not leave any items on the stairs. Make sure that there are handrails on both sides of the stairs and use them. Fix handrails that are broken or loose. Make sure that handrails are as long as the stairways. Check any carpeting to make sure that it is firmly attached to the stairs. Fix any carpet that is loose or worn. Avoid having throw rugs at the top or bottom of the stairs. If you  do have throw rugs, attach them to the floor with carpet tape. Make sure that you have a light switch at the top of the stairs and the bottom of the stairs. If you do not have them, ask someone to add them for you. What else can I do to help prevent falls? Wear shoes that: Do not have high heels. Have rubber bottoms. Are comfortable and fit you well. Are closed at the toe. Do not wear sandals. If you use a stepladder: Make sure that it is fully opened. Do not climb a closed stepladder. Make sure that both sides of the stepladder are locked into place. Ask someone to hold it for you, if possible. Clearly mark and make sure that you can see: Any grab bars or handrails. First and last  steps. Where the edge of each step is. Use tools that help you move around (mobility aids) if they are needed. These include: Canes. Walkers. Scooters. Crutches. Turn on the lights when you go into a dark area. Replace any light bulbs as soon as they burn out. Set up your furniture so you have a clear path. Avoid moving your furniture around. If any of your floors are uneven, fix them. If there are any pets around you, be aware of where they are. Review your medicines with your doctor. Some medicines can make you feel dizzy. This can increase your chance of falling. Ask your doctor what other things that you can do to help prevent falls. This information is not intended to replace advice given to you by your health care provider. Make sure you discuss any questions you have with your health care provider. Document Released: 04/17/2009 Document Revised: 11/27/2015 Document Reviewed: 07/26/2014 Elsevier Interactive Patient Education  2017 [employer].

## 2020-12-25 NOTE — Progress Notes (Signed)
Subjective:   Hannah Morrow is a 80 y.o. female who presents for Medicare Annual (Subsequent) preventive examination.  Review of Systems    na       Objective:    There were no vitals filed for this visit. There is no height or weight on file to calculate BMI.  Advanced Directives 12/18/2020 05/25/2019 11/02/2017 08/04/2015 06/02/2015  Does Patient Have a Medical Advance Directive? Yes Yes Yes Yes Yes  Type of Paramedic of [place];Living will [place];Living will - Living will Living will  Does patient want to make changes to medical advance directive? No - Patient declined No - Patient declined - No - Patient declined -  Copy of [place] in Chart? No - copy requested No - copy requested - No - copy requested -    Current Medications (verified) Outpatient Encounter Medications as of 12/25/2020  Medication Sig   Ascorbic Acid (VITAMIN C) 1000 MG tablet Take 1,000 mg by mouth daily.   aspirin 81 MG tablet Take 1 tablet (81 mg total) by mouth daily.   atenolol (TENORMIN) 25 MG tablet Take 1 tablet (25 mg total) by mouth daily.   b complex vitamins tablet Take 1 tablet by mouth daily.   calcium carbonate (TUMS - DOSED IN MG ELEMENTAL CALCIUM) 500 MG chewable tablet Chew 1 tablet by mouth daily as needed for indigestion or heartburn.    Cholecalciferol (VITAMIN D) 2000 UNITS CAPS Take by mouth.   fluocinonide ointment (LIDEX) 6.28 % Apply 1 application topically daily as needed (itching).   ibuprofen (ADVIL) 200 MG tablet Take 400 mg by mouth in the morning and at bedtime.   levothyroxine (SYNTHROID) 100 MCG tablet TAKE 1 TABLET DAILY BEFORE BREAKFAST   losartan (COZAAR) 100 MG tablet Take 1 tablet (100 mg total) by mouth daily.   simvastatin (ZOCOR) 40 MG tablet TAKE 1 TABLET AT BEDTIME   No facility-administered encounter medications on file as of 12/25/2020.    Allergies (verified) Penicillins, Coreg  [carvedilol], Amlodipine, Metoprolol, Betadine [povidone iodine], Hydrocodone-acetaminophen, Lisinopril, and Tramadol   History: Past Medical History:  Diagnosis Date   Allergy    no meds   ANXIETY    Cancer ([place])    cervical cancer - hysterectomy   GERD    Hyperlipidemia    Hypertension    Hypothyroid    Plantar fasciitis    right   RESTLESS LEGS SYNDROME    Past Surgical History:  Procedure Laterality Date   ABDOMINAL HYSTERECTOMY     BREAST SURGERY     Bilateral breast reduction   COLOSTOMY  04-22-2010   [facility]   cone bx     cervix   CORONARY ARTERY BYPASS GRAFT N/A 05/28/2019   Procedure: CORONARY ARTERY BYPASS GRAFTING (CABG) using the LIMA to LAD; Endoscopic right saphenous vein harvesting to the Ramus and Diag1.;  Surgeon: Grace Isaac, MD;  Location: [facility];  Service: Open Heart Surgery;  Laterality: N/A;   LEFT HEART CATH AND CORONARY ANGIOGRAPHY N/A 05/25/2019   Procedure: LEFT HEART CATH AND CORONARY ANGIOGRAPHY;  Surgeon: Leonie Man, MD;  Location: [facility];  Service: Cardiovascular;  Laterality: N/A;   REDUCTION MAMMAPLASTY Bilateral    TEE WITHOUT CARDIOVERSION N/A 05/28/2019   Procedure: TRANSESOPHAGEAL ECHOCARDIOGRAM (TEE);  Surgeon: Grace Isaac, MD;  Location: [facility];  Service: Open Heart Surgery;  Laterality: N/A;   WISDOM TOOTH EXTRACTION     Family History  Problem Relation Age of Onset   Cancer Sister        melanoma- with mets   Colon cancer Father 31   Stomach cancer Maternal Grandfather    Breast cancer Mother 64   Breast cancer Daughter 15   Rectal cancer Neg Hx    Esophageal cancer Neg Hx    Social History   Socioeconomic History   Marital status: Married    Spouse name: Not on file   Number of children: 2   Years of education: Not on file   Highest education level: Not on file  Occupational History   Not on file  Tobacco Use   Smoking status: Never   Smokeless tobacco: Never  Vaping Use   Vaping Use:  Not on file  Substance and Sexual Activity   Alcohol use: Yes    Alcohol/week: 5.0 standard drinks    Types: 5 Glasses of wine per week    Comment: Occasional   Drug use: No   Sexual activity: Yes    Partners: Male    Birth control/protection: Surgical, Other-see comments    Comment: hysterectomy  Other Topics Concern   Not on file  Social History Narrative   Married for 68 years   Two daughters, five grand children and 5 great grand children. Daughter lives in [place]. Other daughter lives in [place].    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    Exercise Does not exercise. Was going to exercise class but has since quite.    Diet: Veggies, chicken, salmon. Very little red meat. Eats lots of eggs.    Exercise --- walking , gym 3 classes a week   Social Determinants of Health   Financial Resource Strain: Low Risk    Difficulty of Paying Living Expenses: Not hard at all  Food Insecurity: No Food Insecurity   Worried About Charity fundraiser in the Last Year: Never true   Arboriculturist in the Last Year: Never true  Transportation Needs: No Transportation Needs   Lack of Transportation (Medical): No   Lack of Transportation (Non-Medical): No  Physical Activity: Insufficiently Active   Days of Exercise per Week: 7 days   Minutes of Exercise per Session: 20 min  Stress: Stress Concern Present   Feeling of Stress : To some extent  Social Connections: Not on file    Tobacco Counseling Counseling given: Not Answered   Clinical Intake:                 Diabetic?no         Activities of Daily Living No flowsheet data found.  Patient Care Team: [name], NP as PCP - General (Family Medicine) Harriett Sine, MD as Consulting Physician (Dermatology) Dimitri Ped, RN as Case Manager  Indicate any recent Medical Services you may have received from other than [facility] providers in the past year (date  may be approximate).     Assessment:   This is a routine wellness examination for Hannah Morrow.  Hearing/Vision screen No results found.  Dietary issues and exercise activities discussed:     Goals Addressed   None    Depression Screen PHQ 2/9 Scores 12/18/2020 12/18/2019 07/28/2018 07/26/2017 10/18/2014 05/13/2014 09/12/2012  PHQ - 2 Score 0 0 0 0 0 0 1    Fall Risk Fall Risk  12/18/2020 12/18/2019 07/28/2018 11/02/2017 11/02/2017  Falls in the past year? 1  0 0 No No  Comment - - - - -  Number falls in past yr: 0 - 0 - -  Comment slipped in ice - - - -  Injury with Fall? 0 - 1 - -  Follow up Education provided;Falls prevention discussed - - - -    FALL RISK PREVENTION PERTAINING TO THE HOME:  Any stairs in or around the home? No  If so, are there any without handrails? No  Home free of loose throw rugs in walkways, pet beds, electrical cords, etc? Yes  Adequate lighting in your home to reduce risk of falls? Yes   ASSISTIVE DEVICES UTILIZED TO PREVENT FALLS:  Life alert? No  Use of a cane, walker or w/c? No  Grab bars in the bathroom? No  Shower chair or bench in shower? Yes  Elevated toilet seat or a handicapped toilet? Yes   TIMED UP AND GO:  Was the test performed? Yes .  Length of time to ambulate 10 feet: 6 sec.   Gait steady and fast without use of assistive device  Cognitive Function: Normal cognitive status assessed by direct observation by this Nurse Health Advisor. No abnormalities found.   MMSE - Mini Mental State Exam 11/02/2017  Not completed: (No Data)        Immunizations Immunization History  Administered Date(s) Administered   Influenza, High Dose Seasonal PF 05/19/2015, 05/14/2016, 04/05/2017, 04/18/2018   Influenza,inj,Quad PF,6+ Mos 05/13/2014   Influenza-Unspecified 04/11/2020   PFIZER(Purple Top)SARS-COV-2 Vaccination 07/27/2019, 08/14/2019   Pneumococcal Conjugate-13 05/13/2014   Pneumococcal Polysaccharide-23 04/23/2013   Td 12/10/2008   Zoster,  Live 10/06/2012    TDAP status: Up to date  Flu Vaccine status: Up to date  Pneumococcal vaccine status: Up to date  Covid-19 vaccine status: Completed vaccines  Qualifies for Shingles Vaccine? Yes   Zostavax completed No   Shingrix Completed?: No.    Education has been provided regarding the importance of this vaccine. Patient has been advised to call insurance company to determine out of pocket expense if they have not yet received this vaccine. Advised may also receive vaccine at local pharmacy or Health Dept. Verbalized acceptance and understanding.  Screening Tests Health Maintenance  Topic Date Due   Hepatitis C Screening  Never done   Zoster Vaccines- Shingrix (1 of 2) Never done   COVID-19 Vaccine (3 - Booster for Pfizer series) 01/11/2020   COLONOSCOPY (Pts 45-2yrs Insurance coverage will need to be confirmed)  06/17/2020   INFLUENZA VACCINE  02/02/2021   MAMMOGRAM  04/03/2021   DEXA SCAN  Completed   PNA vac Low Risk Adult  Completed   HPV VACCINES  Aged Out    Health Maintenance  Health Maintenance Due  Topic Date Due   Hepatitis C Screening  Never done   Zoster Vaccines- Shingrix (1 of 2) Never done   COVID-19 Vaccine (3 - Booster for Pfizer series) 01/11/2020   COLONOSCOPY (Pts 45-69yrs Insurance coverage will need to be confirmed)  06/17/2020    Colorectal cancer screening: No longer required.   Mammogram status: Completed 04/03/2020. Repeat every year  Bone Density status: Ordered 12/25/2020. Pt provided with contact info and advised to call to schedule appt.  Lung Cancer Screening: (Low Dose CT Chest recommended if Age 48-80 years, 30 pack-year currently smoking OR have quit w/in 15years.) does not qualify.   Lung Cancer Screening Referral: n/a  Additional Screening:  Hepatitis C Screening: does not qualify  Vision Screening: Recommended annual ophthalmology exams for  early detection of glaucoma and other disorders of the eye. Is the patient up  to date with their annual eye exam?  Yes  Who is the provider or what is the name of the office in which the patient attends annual eye exams? Dr.Lyles If pt is not established with a provider, would they like to be referred to a provider to establish care? No .   Dental Screening: Recommended annual dental exams for proper oral hygiene  Community Resource Referral / Chronic Care Management: CRR required this visit?  No   CCM required this visit?  No      Plan:     I have personally reviewed and noted the following in the patient's chart:   Medical and social history Use of alcohol, tobacco or illicit drugs  Current medications and supplements including opioid prescriptions.  Functional ability and status Nutritional status Physical activity Advanced directives List of other physicians Hospitalizations, surgeries, and ER visits in previous 12 months Vitals Screenings to include cognitive, depression, and falls Referrals and appointments  In addition, I have reviewed and discussed with patient certain preventive protocols, quality metrics, and best practice recommendations. A written personalized care plan for preventive services as well as general preventive health recommendations were provided to patient.     Randel Pigg, LPN   [id number]/94/7125   Nurse Notes: none

## 2021-01-02 ENCOUNTER — Other Ambulatory Visit: Payer: Self-pay | Admitting: Adult Health

## 2021-01-02 ENCOUNTER — Telehealth: Payer: Self-pay | Admitting: Adult Health

## 2021-01-02 DIAGNOSIS — N6489 Other specified disorders of breast: Secondary | ICD-10-CM

## 2021-01-02 NOTE — Telephone Encounter (Signed)
Called pt 2x no answer. This seems like a scheduling issue or referral issue. If pt calls back please send to appropriate person.

## 2021-01-02 NOTE — Telephone Encounter (Signed)
Patient is requesting a call back.  She had an appointment to get a mammogram at [facility] on [address] in July.  She cancelled that appointment because she wanted to get her mammogram done at the breast center.  They can't see her until December so she wants to get the appointment back that she had in July at [place].

## 2021-01-14 ENCOUNTER — Other Ambulatory Visit: Payer: Medicare Other

## 2021-01-14 ENCOUNTER — Ambulatory Visit (INDEPENDENT_AMBULATORY_CARE_PROVIDER_SITE_OTHER)
Admission: RE | Admit: 2021-01-14 | Discharge: 2021-01-14 | Disposition: A | Discharge status: Home / Self Care | Payer: Medicare Other | Source: Ambulatory Visit | Attending: Family Medicine | Admitting: Family Medicine

## 2021-01-14 ENCOUNTER — Other Ambulatory Visit: Payer: Self-pay

## 2021-01-14 DIAGNOSIS — Z20822 Contact with and (suspected) exposure to covid-19: Secondary | ICD-10-CM | POA: Diagnosis not present

## 2021-01-14 DIAGNOSIS — Z780 Asymptomatic menopausal state: Secondary | ICD-10-CM

## 2021-01-16 ENCOUNTER — Telehealth: Payer: Medicare Other

## 2021-01-29 ENCOUNTER — Other Ambulatory Visit: Payer: Self-pay

## 2021-01-30 ENCOUNTER — Encounter: Payer: Self-pay | Admitting: Adult Health

## 2021-01-30 ENCOUNTER — Ambulatory Visit (INDEPENDENT_AMBULATORY_CARE_PROVIDER_SITE_OTHER): Payer: Medicare Other | Admitting: Adult Health

## 2021-01-30 VITALS — BP 124/76 | HR 62 | Temp 98.2°F | Ht 62.0 in | Wt 194.4 lb

## 2021-01-30 DIAGNOSIS — R739 Hyperglycemia, unspecified: Secondary | ICD-10-CM | POA: Diagnosis not present

## 2021-01-30 DIAGNOSIS — E039 Hypothyroidism, unspecified: Secondary | ICD-10-CM

## 2021-01-30 DIAGNOSIS — Z1159 Encounter for screening for other viral diseases: Secondary | ICD-10-CM | POA: Diagnosis not present

## 2021-01-30 DIAGNOSIS — E782 Mixed hyperlipidemia: Secondary | ICD-10-CM | POA: Diagnosis not present

## 2021-01-30 DIAGNOSIS — I10 Essential (primary) hypertension: Secondary | ICD-10-CM | POA: Diagnosis not present

## 2021-01-30 DIAGNOSIS — I6522 Occlusion and stenosis of left carotid artery: Secondary | ICD-10-CM

## 2021-01-30 DIAGNOSIS — F4321 Adjustment disorder with depressed mood: Secondary | ICD-10-CM

## 2021-01-30 NOTE — Progress Notes (Signed)
Subjective:    Patient ID: Hannah Morrow, female    DOB: June 17, 1941, 80 y.o.   MRN: [id number]  HPI Patient presents for yearly preventative medicine examination. She is a pleasant 80 year old female who  has a past medical history of Allergy, ANXIETY, Cancer ([place]), GERD, Hyperlipidemia, Hypertension, Hypothyroid, Plantar fasciitis, and RESTLESS LEGS SYNDROME.  Grief Reaction- Her husband passed away this month and she has been having a hard time dealing with his death, understandably. She is going to go to [place] in September with her children to spread his ashes. She does not feel like leaving the house and does not want to do anything .  Hyperlipidemia -prescribed Zocor 40 mg daily.  She denies myalgia or fatigue Lab Results  Component Value Date   CHOL 158 12/18/2019   HDL 55.10 12/18/2019   LDLCALC 72 12/18/2019   LDLDIRECT 107.0 07/26/2017   TRIG 155.0 (H) 12/18/2019   CHOLHDL 3 12/18/2019   Essential Hypertension -takes Atenolol 25 mg daily and Cozaar 100 mg daily.  She denies dizziness, lightheadedness, chest pain, shortness of breath.  She does monitor her blood pressures at home with readings in the [id number] systolic consistently.   BP Readings from Last 3 Encounters:  01/30/21 124/76  12/25/20 134/64  09/08/20 (!) 160/110   Hypothyroidism - takes synthroid 112 mcg daily.   CAD -she had a diagnostic catheterization on 05/27/2021 after presenting to cardiology with exertional chest pain and abnormal EKG with T wave inversion.  Her cardiac cath revealed severe disease including left main disease and she underwent LIMA to LAD, SVG to the ramus intermedius and SVG to the diagonal.  All immunizations and health maintenance protocols were reviewed with the patient and needed orders were placed.  Appropriate screening laboratory values were ordered for the patient including screening of hyperlipidemia, renal function and hepatic function. If indicated by BPH, a PSA was  ordered.  Medication reconciliation,  past medical history, social history, problem list and allergies were reviewed in detail with the patient  Goals were established with regard to weight loss, exercise, and  diet in compliance with medications  She no longer needs to have a colonoscopy due to age.  Has her mammogram scheduled for October 2022  Review of Systems  Constitutional: Negative.   HENT: Negative.    Eyes: Negative.   Respiratory: Negative.    Cardiovascular: Negative.   Gastrointestinal: Negative.   Endocrine: Negative.   Genitourinary: Negative.   Musculoskeletal: Negative.   Skin: Negative.   Allergic/Immunologic: Negative.   Neurological: Negative.   Hematological: Negative.   Psychiatric/Behavioral:  Positive for dysphoric mood.    Past Medical History:  Diagnosis Date   Allergy    no meds   ANXIETY    Cancer ([place])    cervical cancer - hysterectomy   GERD    Hyperlipidemia    Hypertension    Hypothyroid    Plantar fasciitis    right   RESTLESS LEGS SYNDROME     Social History   Socioeconomic History   Marital status: Married    Spouse name: Not on file   Number of children: 2   Years of education: Not on file   Highest education level: Not on file  Occupational History   Not on file  Tobacco Use   Smoking status: Never   Smokeless tobacco: Never  Vaping Use   Vaping Use: Not on file  Substance and Sexual Activity   Alcohol use: Yes  Alcohol/week: 5.0 standard drinks    Types: 5 Glasses of wine per week    Comment: Occasional   Drug use: No   Sexual activity: Yes    Partners: Male    Birth control/protection: Surgical, Other-see comments    Comment: hysterectomy  Other Topics Concern   Not on file  Social History Narrative   Married for 31 years   Two daughters, five grand children and 5 great grand children. Daughter lives in [place]. Other daughter lives in [place].    No longer working, was an Secretary/administrator as LPN    Hobbies: paint, sew  ( makes dolls clothes), read.    Exercise Does not exercise. Was going to exercise class but has since quite.    Diet: Veggies, chicken, salmon. Very little red meat. Eats lots of eggs.    Exercise --- walking , gym 3 classes a week   Social Determinants of Health   Financial Resource Strain: Low Risk    Difficulty of Paying Living Expenses: Not hard at all  Food Insecurity: No Food Insecurity   Worried About Charity fundraiser in the Last Year: Never true   Arboriculturist in the Last Year: Never true  Transportation Needs: No Transportation Needs   Lack of Transportation (Medical): No   Lack of Transportation (Non-Medical): No  Physical Activity: Sufficiently Active   Days of Exercise per Week: 7 days   Minutes of Exercise per Session: 30 min  Stress: Stress Concern Present   Feeling of Stress : To some extent  Social Connections: Moderately Isolated   Frequency of Communication with Friends and Family: Three times a week   Frequency of Social Gatherings with Friends and Family: Three times a week   Attends Religious Services: More than 4 times per year   Active Member of Clubs or Organizations: No   Attends Archivist Meetings: Never   Marital Status: Widowed  Human resources officer Violence: Not At Risk   Fear of Current or Ex-Partner: No   Emotionally Abused: No   Physically Abused: No   Sexually Abused: No    Past Surgical History:  Procedure Laterality Date   ABDOMINAL HYSTERECTOMY     BREAST SURGERY     Bilateral breast reduction   COLOSTOMY  04-22-2010   [facility]   cone bx     cervix   CORONARY ARTERY BYPASS GRAFT N/A 05/28/2019   Procedure: CORONARY ARTERY BYPASS GRAFTING (CABG) using the LIMA to LAD; Endoscopic right saphenous vein harvesting to the Ramus and Diag1.;  Surgeon: Grace Isaac, MD;  Location: [facility];  Service: Open Heart Surgery;  Laterality: N/A;   LEFT HEART CATH AND CORONARY ANGIOGRAPHY N/A 05/25/2019   Procedure: LEFT HEART CATH AND  CORONARY ANGIOGRAPHY;  Surgeon: Leonie Man, MD;  Location: [facility];  Service: Cardiovascular;  Laterality: N/A;   REDUCTION MAMMAPLASTY Bilateral    TEE WITHOUT CARDIOVERSION N/A 05/28/2019   Procedure: TRANSESOPHAGEAL ECHOCARDIOGRAM (TEE);  Surgeon: Grace Isaac, MD;  Location: [facility];  Service: Open Heart Surgery;  Laterality: N/A;   WISDOM TOOTH EXTRACTION      Family History  Problem Relation Age of Onset   Cancer Sister        melanoma- with mets   Colon cancer Father 66   Stomach cancer Maternal Grandfather    Breast cancer Mother 52   Breast cancer Daughter 28   Rectal cancer Neg Hx    Esophageal cancer Neg  Hx     Allergies  Allergen Reactions   Penicillins Shortness Of Breath    red face Did it involve swelling of the face/tongue/throat, SOB, or low BP? Yes Did it involve sudden or severe rash/hives, skin peeling, or any reaction on the inside of your mouth or nose? Yes Did you need to seek medical attention at a hospital or doctor's office? Yes When did it last happen?      40+ years If all above answers are "NO", may proceed with cephalosporin use.    Coreg [Carvedilol] Rash   Amlodipine     Pt states it made her feel "awful and weird."   Metoprolol Other (See Comments)    " feel weird"   Betadine [Povidone Iodine] Rash   Hydrocodone-Acetaminophen Rash    ras   Lisinopril     Fatigue    Tramadol Itching and Rash    Current Outpatient Medications on File Prior to Visit  Medication Sig Dispense Refill   Ascorbic Acid (VITAMIN C) 1000 MG tablet Take 1,000 mg by mouth daily.     aspirin 81 MG tablet Take 1 tablet (81 mg total) by mouth daily.     atenolol (TENORMIN) 25 MG tablet Take 1 tablet (25 mg total) by mouth daily. 90 tablet 3   b complex vitamins tablet Take 1 tablet by mouth daily.     calcium carbonate (TUMS - DOSED IN MG ELEMENTAL CALCIUM) 500 MG chewable tablet Chew 1 tablet by mouth daily as needed for indigestion or heartburn.       Cholecalciferol (VITAMIN D) 2000 UNITS CAPS Take by mouth.     fluocinonide ointment (LIDEX) AB-123456789 % Apply 1 application topically daily as needed (itching). 60 g 1   ibuprofen (ADVIL) 200 MG tablet Take 400 mg by mouth in the morning and at bedtime.     levothyroxine (SYNTHROID) 100 MCG tablet TAKE 1 TABLET DAILY BEFORE BREAKFAST 90 tablet 0   simvastatin (ZOCOR) 40 MG tablet TAKE 1 TABLET AT BEDTIME 90 tablet 3   losartan (COZAAR) 100 MG tablet Take 1 tablet (100 mg total) by mouth daily. 90 tablet 3   No current facility-administered medications on file prior to visit.    BP 124/76 (BP Location: Left Arm, Patient Position: Sitting, Cuff Size: Normal)   Pulse 62   Temp 98.2 F (36.8 C) (Oral)   Ht '5\' 2"'$  (1.575 m)   Wt 194 lb 6.4 oz (88.2 kg)   SpO2 98%   BMI 35.56 kg/m       Objective:   Physical Exam Vitals and nursing note reviewed.  Constitutional:      General: She is not in acute distress.    Appearance: Normal appearance. She is well-developed. She is not ill-appearing.  HENT:     Head: Normocephalic and atraumatic.     Right Ear: Tympanic membrane, ear canal and external ear normal. There is no impacted cerumen.     Left Ear: Tympanic membrane, ear canal and external ear normal. There is no impacted cerumen.     Nose: Nose normal. No congestion or rhinorrhea.     Mouth/Throat:     Mouth: Mucous membranes are moist.     Pharynx: Oropharynx is clear. No oropharyngeal exudate or posterior oropharyngeal erythema.  Eyes:     General:        Right eye: No discharge.        Left eye: No discharge.     Extraocular Movements: Extraocular movements intact.  Conjunctiva/sclera: Conjunctivae normal.     Pupils: Pupils are equal, round, and reactive to light.  Neck:     Thyroid: No thyromegaly.     Vascular: No carotid bruit.     Trachea: No tracheal deviation.  Cardiovascular:     Rate and Rhythm: Normal rate and regular rhythm.     Pulses: Normal pulses.      Heart sounds: Normal heart sounds. No murmur heard.   No friction rub. No gallop.  Pulmonary:     Effort: Pulmonary effort is normal. No respiratory distress.     Breath sounds: Normal breath sounds. No stridor. No wheezing, rhonchi or rales.  Chest:     Chest wall: No tenderness.  Abdominal:     General: Abdomen is flat. Bowel sounds are normal. There is no distension.     Palpations: Abdomen is soft. There is no mass.     Tenderness: There is no abdominal tenderness. There is no right CVA tenderness, left CVA tenderness, guarding or rebound.     Hernia: No hernia is present.  Musculoskeletal:        General: No swelling, tenderness, deformity or signs of injury. Normal range of motion.     Cervical back: Normal range of motion and neck supple.     Right lower leg: No edema.     Left lower leg: No edema.  Lymphadenopathy:     Cervical: No cervical adenopathy.  Skin:    General: Skin is warm and dry.     Coloration: Skin is not jaundiced or pale.     Findings: No bruising, erythema, lesion or rash.  Neurological:     General: No focal deficit present.     Mental Status: She is alert and oriented to person, place, and time.     Cranial Nerves: No cranial nerve deficit.     Sensory: No sensory deficit.     Motor: No weakness.     Coordination: Coordination normal.     Gait: Gait normal.     Deep Tendon Reflexes: Reflexes normal.  Psychiatric:        Mood and Affect: Mood normal.        Behavior: Behavior normal.        Thought Content: Thought content normal.        Judgment: Judgment normal.      Assessment & Plan:  1. Essential hypertension - Well controlled. No changes in medications  - CBC with Differential/Platelet; Future - Comprehensive metabolic panel; Future - Lipid panel; Future - TSH; Future  2. Mixed hyperlipidemia - Consider increase in statin  - CBC with Differential/Platelet; Future - Comprehensive metabolic panel; Future - Lipid panel; Future - TSH;  Future  3. Hypothyroidism, unspecified type - Consider increase in synthroid  - CBC with Differential/Platelet; Future - Comprehensive metabolic panel; Future - Lipid panel; Future - TSH; Future  4. Need for hepatitis C screening test  - Hep C Antibody; Future  5. Stenosis of left carotid artery - Continue statin. Follow up with cardiology as directed - CBC with Differential/Platelet; Future - Comprehensive metabolic panel; Future - Lipid panel; Future - TSH; Future  6. Hyperglycemia  - Hemoglobin A1c; Future  7. Grief reaction - My condolences were given. Discussed starting antidepressants. She does not want to start any additional medications at this time   [name], NP

## 2021-02-02 ENCOUNTER — Other Ambulatory Visit: Payer: Medicare Other

## 2021-02-03 ENCOUNTER — Other Ambulatory Visit (INDEPENDENT_AMBULATORY_CARE_PROVIDER_SITE_OTHER): Payer: Medicare Other

## 2021-02-03 ENCOUNTER — Telehealth: Payer: Self-pay | Admitting: Adult Health

## 2021-02-03 ENCOUNTER — Other Ambulatory Visit: Payer: Self-pay

## 2021-02-03 DIAGNOSIS — I6522 Occlusion and stenosis of left carotid artery: Secondary | ICD-10-CM | POA: Diagnosis not present

## 2021-02-03 DIAGNOSIS — R739 Hyperglycemia, unspecified: Secondary | ICD-10-CM | POA: Diagnosis not present

## 2021-02-03 DIAGNOSIS — E039 Hypothyroidism, unspecified: Secondary | ICD-10-CM

## 2021-02-03 DIAGNOSIS — Z1159 Encounter for screening for other viral diseases: Secondary | ICD-10-CM

## 2021-02-03 DIAGNOSIS — E782 Mixed hyperlipidemia: Secondary | ICD-10-CM | POA: Diagnosis not present

## 2021-02-03 DIAGNOSIS — I10 Essential (primary) hypertension: Secondary | ICD-10-CM | POA: Diagnosis not present

## 2021-02-03 LAB — COMPREHENSIVE METABOLIC PANEL
ALT: 17 U/L (ref 0–35)
AST: 18 U/L (ref 0–37)
Albumin: 4.3 g/dL (ref 3.5–5.2)
Alkaline Phosphatase: 50 U/L (ref 39–117)
BUN: 29 mg/dL — ABNORMAL HIGH (ref 6–23)
CO2: 26 mEq/L (ref 19–32)
Calcium: 9.8 mg/dL (ref 8.4–10.5)
Chloride: 104 mEq/L (ref 96–112)
Creatinine, Ser: 1.22 mg/dL — ABNORMAL HIGH (ref 0.40–1.20)
GFR: 42.4 mL/min — ABNORMAL LOW (ref >60.00)
Glucose, Bld: 104 mg/dL — ABNORMAL HIGH (ref 70–99)
Potassium: 4.5 mEq/L (ref 3.5–5.1)
Sodium: 141 mEq/L (ref 135–145)
Total Bilirubin: 0.8 mg/dL (ref 0.2–1.2)
Total Protein: 7.1 g/dL (ref 6.0–8.3)

## 2021-02-03 LAB — CBC WITH DIFFERENTIAL/PLATELET
Basophils Absolute: 0.1 10*3/uL (ref 0.0–0.1)
Basophils Relative: 1.1 % (ref 0.0–3.0)
Eosinophils Absolute: 0.2 10*3/uL (ref 0.0–0.7)
Eosinophils Relative: 4.2 % (ref 0.0–5.0)
HCT: 43.4 % (ref 36.0–46.0)
Hemoglobin: 14 g/dL (ref 12.0–15.0)
Lymphocytes Relative: 37.7 % (ref 12.0–46.0)
Lymphs Abs: 2.2 10*3/uL (ref 0.7–4.0)
MCHC: 32.2 g/dL (ref 30.0–36.0)
MCV: 79.4 fl (ref 78.0–100.0)
Monocytes Absolute: 0.6 10*3/uL (ref 0.1–1.0)
Monocytes Relative: 10.1 % (ref 3.0–12.0)
Neutro Abs: 2.8 10*3/uL (ref 1.4–7.7)
Neutrophils Relative %: 46.9 % (ref 43.0–77.0)
Platelets: 206 10*3/uL (ref 150.0–400.0)
RBC: 5.47 Mil/uL — ABNORMAL HIGH (ref 3.87–5.11)
RDW: 13.7 % (ref 11.5–15.5)
WBC: 5.9 10*3/uL (ref 4.0–10.5)

## 2021-02-03 LAB — LIPID PANEL
Cholesterol: 178 mg/dL (ref 0–200)
HDL: 50.2 mg/dL (ref >39.00)
NonHDL: 127.87
Total CHOL/HDL Ratio: 4
Triglycerides: 225 mg/dL — ABNORMAL HIGH (ref 0.0–149.0)
VLDL: 45 mg/dL — ABNORMAL HIGH (ref 0.0–40.0)

## 2021-02-03 LAB — TSH: TSH: 13.87 u[IU]/mL — ABNORMAL HIGH (ref 0.35–5.50)

## 2021-02-03 LAB — LDL CHOLESTEROL, DIRECT: Direct LDL: 99 mg/dL

## 2021-02-03 LAB — HEMOGLOBIN A1C: Hgb A1c MFr Bld: 5.7 % (ref 4.6–6.5)

## 2021-02-03 NOTE — Telephone Encounter (Signed)
Spoke to patient and informed of labs. Her TSH is 13. She is taking synthroid 100 mcg every morning.   Will have her increase back to 112 mcg ( she has a bottle of this at home) retest in one month

## 2021-02-04 DIAGNOSIS — Z20822 Contact with and (suspected) exposure to covid-19: Secondary | ICD-10-CM | POA: Diagnosis not present

## 2021-02-04 LAB — HEPATITIS C ANTIBODY
Hepatitis C Ab: NONREACTIVE
SIGNAL TO CUT-OFF: 0.03 (ref <1.00)

## 2021-03-05 DIAGNOSIS — L570 Actinic keratosis: Secondary | ICD-10-CM | POA: Diagnosis not present

## 2021-03-05 DIAGNOSIS — L814 Other melanin hyperpigmentation: Secondary | ICD-10-CM | POA: Diagnosis not present

## 2021-03-05 DIAGNOSIS — Z85828 Personal history of other malignant neoplasm of skin: Secondary | ICD-10-CM | POA: Diagnosis not present

## 2021-03-05 DIAGNOSIS — D1801 Hemangioma of skin and subcutaneous tissue: Secondary | ICD-10-CM | POA: Diagnosis not present

## 2021-03-05 DIAGNOSIS — L821 Other seborrheic keratosis: Secondary | ICD-10-CM | POA: Diagnosis not present

## 2021-03-06 ENCOUNTER — Ambulatory Visit: Payer: Medicare Other | Admitting: Adult Health

## 2021-03-06 ENCOUNTER — Other Ambulatory Visit (INDEPENDENT_AMBULATORY_CARE_PROVIDER_SITE_OTHER): Payer: Medicare Other

## 2021-03-06 ENCOUNTER — Other Ambulatory Visit: Payer: Self-pay

## 2021-03-06 DIAGNOSIS — E039 Hypothyroidism, unspecified: Secondary | ICD-10-CM

## 2021-03-06 LAB — TSH: TSH: 3.9 u[IU]/mL (ref 0.35–5.50)

## 2021-03-11 ENCOUNTER — Other Ambulatory Visit: Payer: Self-pay

## 2021-03-11 ENCOUNTER — Encounter: Payer: Self-pay | Admitting: Cardiovascular Disease

## 2021-03-11 ENCOUNTER — Other Ambulatory Visit: Payer: Self-pay | Admitting: Adult Health

## 2021-03-11 ENCOUNTER — Ambulatory Visit (INDEPENDENT_AMBULATORY_CARE_PROVIDER_SITE_OTHER): Payer: Medicare Other | Admitting: Cardiovascular Disease

## 2021-03-11 VITALS — BP 153/81 | HR 56 | Resp 20 | Ht 62.0 in | Wt 200.0 lb

## 2021-03-11 DIAGNOSIS — E785 Hyperlipidemia, unspecified: Secondary | ICD-10-CM

## 2021-03-11 DIAGNOSIS — Z951 Presence of aortocoronary bypass graft: Secondary | ICD-10-CM | POA: Diagnosis not present

## 2021-03-11 DIAGNOSIS — I6522 Occlusion and stenosis of left carotid artery: Secondary | ICD-10-CM

## 2021-03-11 DIAGNOSIS — I10 Essential (primary) hypertension: Secondary | ICD-10-CM | POA: Diagnosis not present

## 2021-03-11 MED ORDER — ATORVASTATIN CALCIUM 40 MG PO TABS: 40.0000 mg | ORAL_TABLET | Freq: Every day | ORAL | 3 refills | Status: DC

## 2021-03-11 NOTE — Assessment & Plan Note (Signed)
History of hyperlipidemia on statin therapy with lipid profile performed 02/03/2021 revealing total cholesterol 178, direct LDL of 99, and HDL of 50.  She is not at goal for secondary prevention.  I am going to change her simvastatin 40 mg a day to atorvastatin 40 mg a day and we will recheck a lipid liver profile in 2 months.

## 2021-03-11 NOTE — Patient Instructions (Addendum)
Medication Instructions:  STOP SIMVASTATIN 40 MG  START ATORVASTATIN 40 MG  *If you need a refill on your cardiac medications before your next appointment, please call your pharmacy*   Lab Work: FASTING LIPID AND LIVER FIRST WEEK OF November  If you have labs (blood work) drawn today and your tests are completely normal, you will receive your results only by: [place] (if you have MyChart) OR A paper copy in the mail If you have any lab test that is abnormal or we need to change your treatment, we will call you to review the results.   Testing/Procedures: NONE   Follow-Up: At [facility], you and your health needs are our priority.  As part of our continuing mission to provide you with exceptional heart care, we have created designated Provider Care Teams.  These Care Teams include your primary Cardiologist (physician) and Advanced Practice Providers (APPs -  Physician Assistants and Nurse Practitioners) who all work together to provide you with the care you need, when you need it.  We recommend signing up for the patient portal called "MyChart".  Sign up information is provided on this After Visit Summary.  MyChart is used to connect with patients for Virtual Visits (Telemedicine).  Patients are able to view lab/test results, encounter notes, upcoming appointments, etc.  Non-urgent messages can be sent to your provider as well.   To learn more about what you can do with MyChart, go to [email or web address].    Your next appointment:   12 month(s)  The format for your next appointment:   In Person  Provider:    Quay Burow, MD   Other Instructions NONE

## 2021-03-11 NOTE — Progress Notes (Signed)
03/11/2021 Hannah Morrow   12/14/1940  [id number]  Primary Physician Morrow, Hannah Rumps, NP Primary Cardiologist: Hannah Harp MD Hannah Morrow, [name]  HPI:  Hannah Morrow is a 80 y.o.  moderately overweight married Caucasian female mother of 2 daughters, grandmother of 5 grandchildren and great grandmother of 51 great grandchildren referred by Hannah Peng NP for evaluation of chest pain.  Her husband Hannah Morrow was also a patient of mine.  Unfortunately, Hannah Morrow passed away 24-Dec-2020.  They were married for 42 years.  I last saw her in the office 06/13/2019. 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  Her risk factors include treated hypertension and hyperlipidemia.  There is no family history of heart disease.  She is never had a heart attack or stroke.  She is otherwise healthy.  She does have a history of GERD.  She had chest pain last week lasting 3 days rating to her neck back shoulders and throat relieved with eructation.  She has had no recurrent symptoms.   I had arranged for her to undergo undergo coronary CTA which was not scheduled scheduled until the beginning of December.  She came back in to see Hannah Morrow because of progressive angina on 05/23/2019 with anterior T wave inversion more pronounced than on the prior EKG.  She was admitted for cardiac cath performed by Dr. Ellyn Morrow on 05/25/2019 revealing severe two-vessel disease requiring surgical revascularization which was performed 3 days later by Dr. Servando Morrow.  She had LIMA to LAD, vein to ramus branch and diagonal branch.  She was discharged home on 28 November and has been recuperating at home with major complaints of fatigue.   Since I saw her a year ago her husband Hannah Morrow unfortunately passed away in 2022/12/26 of this year after being married for 42 years.  She is heading out to [place] next week to visit one of her daughters who lives there in [place].  She remains active and denies chest pain or  shortness of breath.     Current Meds  Medication Sig   Ascorbic Acid (VITAMIN C) 1000 MG tablet Take 1,000 mg by mouth daily.   aspirin 81 MG tablet Take 1 tablet (81 mg total) by mouth daily.   atenolol (TENORMIN) 25 MG tablet Take 1 tablet (25 mg total) by mouth daily.   b complex vitamins tablet Take 1 tablet by mouth daily.   calcium carbonate (TUMS - DOSED IN MG ELEMENTAL CALCIUM) 500 MG chewable tablet Chew 1 tablet by mouth daily as needed for indigestion or heartburn.    Cholecalciferol (VITAMIN D) 2000 UNITS CAPS Take by mouth.   fluocinonide ointment (LIDEX) AB-123456789 % Apply 1 application topically daily as needed (itching).   ibuprofen (ADVIL) 200 MG tablet Take 400 mg by mouth in the morning and at bedtime.   levothyroxine (SYNTHROID) 100 MCG tablet TAKE 1 TABLET DAILY BEFORE BREAKFAST (Patient taking differently: 112 mcg.)   losartan (COZAAR) 100 MG tablet TAKE 1 TABLET DAILY (Patient taking differently: 100 mg. TAKING 112 MG)   simvastatin (ZOCOR) 40 MG tablet TAKE 1 TABLET AT BEDTIME     Allergies  Allergen Reactions   Penicillins Shortness Of Breath    red face Did it involve swelling of the face/tongue/throat, SOB, or low BP? Yes Did it involve sudden or severe rash/hives, skin peeling, or any reaction on the inside of your mouth or nose? Yes Did you need to seek medical attention at a hospital or  doctor's office? Yes When did it last happen?      40+ years If all above answers are "NO", may proceed with cephalosporin use.    Coreg [Carvedilol] Rash   Amlodipine     Pt states it made her feel "awful and weird."   Metoprolol Other (See Comments)    " feel weird"   Betadine [Povidone Iodine] Rash   Hydrocodone-Acetaminophen Rash    ras   Lisinopril     Fatigue    Tramadol Itching and Rash    Social History   Socioeconomic History   Marital status: Married    Spouse name: Not on file   Number of children: 2   Years of education: Not on file   Highest  education level: Not on file  Occupational History   Not on file  Tobacco Use   Smoking status: Never   Smokeless tobacco: Never  Vaping Use   Vaping Use: Not on file  Substance and Sexual Activity   Alcohol use: Yes    Alcohol/week: 5.0 standard drinks    Types: 5 Glasses of wine per week    Comment: Occasional   Drug use: No   Sexual activity: Yes    Partners: Male    Birth control/protection: Surgical, Other-see comments    Comment: hysterectomy  Other Topics Concern   Not on file  Social History Narrative   Married for 20 years   Two daughters, five grand children and 5 great grand children. Daughter lives in [place]. Other daughter lives in [place].    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    Exercise Does not exercise. Was going to exercise class but has since quite.    Diet: Veggies, chicken, salmon. Very little red meat. Eats lots of eggs.    Exercise --- walking , gym 3 classes a week   Social Determinants of Health   Financial Resource Strain: Low Risk    Difficulty of Paying Living Expenses: Not hard at all  Food Insecurity: No Food Insecurity   Worried About Charity fundraiser in the Last Year: Never true   Arboriculturist in the Last Year: Never true  Transportation Needs: No Transportation Needs   Lack of Transportation (Medical): No   Lack of Transportation (Non-Medical): No  Physical Activity: Sufficiently Active   Days of Exercise per Week: 7 days   Minutes of Exercise per Session: 30 min  Stress: Stress Concern Present   Feeling of Stress : To some extent  Social Connections: Moderately Isolated   Frequency of Communication with Friends and Family: Three times a week   Frequency of Social Gatherings with Friends and Family: Three times a week   Attends Religious Services: More than 4 times per year   Active Member of Clubs or Organizations: No   Attends Archivist Meetings: Never    Marital Status: Widowed  Human resources officer Violence: Not At Risk   Fear of Current or Ex-Partner: No   Emotionally Abused: No   Physically Abused: No   Sexually Abused: No     Review of Systems: General: negative for chills, fever, night sweats or weight changes.  Cardiovascular: negative for chest pain, dyspnea on exertion, edema, orthopnea, palpitations, paroxysmal nocturnal dyspnea or shortness of breath Dermatological: negative for rash Respiratory: negative for cough or wheezing Urologic: negative for hematuria Abdominal: negative for nausea, vomiting, diarrhea, bright red blood  per rectum, melena, or hematemesis Neurologic: negative for visual changes, syncope, or dizziness All other systems reviewed and are otherwise negative except as noted above.    Blood pressure (!) 153/81, pulse (!) 56, resp. rate 20, height '5\' 2"'$  (1.575 m), weight 200 lb (90.7 kg), SpO2 98 %.  General appearance: alert and no distress Neck: no adenopathy, no carotid bruit, no JVD, supple, symmetrical, trachea midline, and thyroid not enlarged, symmetric, no tenderness/mass/nodules Lungs: clear to auscultation bilaterally Heart: regular rate and rhythm, S1, S2 normal, no murmur, click, rub or gallop Extremities: extremities normal, atraumatic, no cyanosis or edema Pulses: 2+ and symmetric Skin: Skin color, texture, turgor normal. No rashes or lesions Neurologic: Grossly normal  EKG sinus bradycardia 56 with poor R wave progression.  I personally reviewed this EKG.  ASSESSMENT AND PLAN:   Dyslipidemia, goal LDL below 70 History of hyperlipidemia on statin therapy with lipid profile performed 02/03/2021 revealing total cholesterol 178, direct LDL of 99, and HDL of 50.  She is not at goal for secondary prevention.  I am going to change her simvastatin 40 mg a day to atorvastatin 40 mg a day and we will recheck a lipid liver profile in 2 months.  Essential hypertension History of essential hypertension a  blood pressure measured today at 153/81.  She is on atenolol and losartan.  S/P CABG x 3 History of CAD status post coronary artery bypass grafting by Dr. Servando Morrow 05/28/2019 after cardiac catheterization performed by Dr. Ellyn Morrow revealed two-vessel disease.  She had a LIMA to her LAD and a vein to the ramus branch and diagonal branch.  She was discharged home 5 days later.  She is recuperated nicely and denies chest pain or shortness of breath.     Hannah Harp MD FACP,FACC,FAHA, [facility] 03/11/2021 11:32 AM

## 2021-03-11 NOTE — Assessment & Plan Note (Signed)
History of essential hypertension a blood pressure measured today at 153/81.  She is on atenolol and losartan.

## 2021-03-11 NOTE — Assessment & Plan Note (Signed)
History of CAD status post coronary artery bypass grafting by Dr. Servando Snare 05/28/2019 after cardiac catheterization performed by Dr. Ellyn Hack revealed two-vessel disease.  She had a LIMA to her LAD and a vein to the ramus branch and diagonal branch.  She was discharged home 5 days later.  She is recuperated nicely and denies chest pain or shortness of breath.

## 2021-03-29 ENCOUNTER — Encounter: Payer: Self-pay | Admitting: Adult Health

## 2021-04-06 ENCOUNTER — Ambulatory Visit
Admission: RE | Admit: 2021-04-06 | Discharge: 2021-04-06 | Disposition: A | Discharge status: Home / Self Care | Payer: Medicare Other | Source: Ambulatory Visit | Attending: Adult Health | Admitting: Adult Health

## 2021-04-06 ENCOUNTER — Other Ambulatory Visit: Payer: Self-pay

## 2021-04-06 DIAGNOSIS — R922 Inconclusive mammogram: Secondary | ICD-10-CM | POA: Diagnosis not present

## 2021-04-06 DIAGNOSIS — N6489 Other specified disorders of breast: Secondary | ICD-10-CM

## 2021-05-08 ENCOUNTER — Other Ambulatory Visit: Payer: Self-pay

## 2021-05-08 DIAGNOSIS — E785 Hyperlipidemia, unspecified: Secondary | ICD-10-CM

## 2021-05-08 LAB — LIPID PANEL
Chol/HDL Ratio: 3 ratio (ref 0.0–4.4)
Cholesterol, Total: 160 mg/dL (ref 100–199)
HDL: 53 mg/dL (ref >39)
LDL Chol Calc (NIH): 71 mg/dL (ref 0–99)
Triglycerides: 218 mg/dL — ABNORMAL HIGH (ref 0–149)
VLDL Cholesterol Cal: 36 mg/dL (ref 5–40)

## 2021-05-08 LAB — HEPATIC FUNCTION PANEL
ALT: 28 IU/L (ref 0–32)
AST: 28 IU/L (ref 0–40)
Albumin: 4.7 g/dL (ref 3.7–4.7)
Alkaline Phosphatase: 55 IU/L (ref 44–121)
Bilirubin Total: 0.8 mg/dL (ref 0.0–1.2)
Bilirubin, Direct: 0.19 mg/dL (ref 0.00–0.40)
Total Protein: 7 g/dL (ref 6.0–8.5)

## 2021-05-13 DIAGNOSIS — Z20822 Contact with and (suspected) exposure to covid-19: Secondary | ICD-10-CM | POA: Diagnosis not present

## 2021-06-02 ENCOUNTER — Encounter: Payer: Self-pay | Admitting: Cardiovascular Disease

## 2021-06-02 ENCOUNTER — Telehealth: Payer: Self-pay

## 2021-06-02 MED ORDER — ATORVASTATIN CALCIUM 40 MG PO TABS: 40.0000 mg | ORAL_TABLET | Freq: Every day | ORAL | 3 refills | Status: DC

## 2021-06-02 NOTE — Telephone Encounter (Signed)
Caller states she has diarrhea, weakness and severe pain in her lower left side. Caller is having flank pain left side , diarrhea and nausea. No fever.  06/02/2021 12:14:01 PM Go to ED Now Self, RN, [place] Understands Yes  Referrals [facility] - ED  06/02/21 1600: Pt states she decided not to go to ED b/c she didn't want to sit over there all day. States she took Motrin & pain has subsided. Diarrhea has decreased. Denies fever since Sunday (states fever & chills Sunday). Pt states she has not had a stool this morning. Is keeping herself hydrated. Pt advised that since symptoms are improving she may continue home care management; if symptoms worsen or return she should make an appt with PCP. Pt verb understanding.

## 2021-07-11 ENCOUNTER — Encounter: Payer: Self-pay | Admitting: Adult Health

## 2021-07-16 ENCOUNTER — Telehealth: Payer: Self-pay | Admitting: Adult Health

## 2021-07-16 NOTE — Telephone Encounter (Signed)
Pt has sent mychart message and needs a refill on levothyroxine (SYNTHROID) 112 MCG tablet .  Pt said she takes 112 mcg . Please send to   [place], [place] Phone:  [phone]  Fax:  [phone]    Pt only has 1 wk worth of medication left

## 2021-07-17 NOTE — Telephone Encounter (Signed)
Please see Mychart message.

## 2021-07-28 ENCOUNTER — Other Ambulatory Visit: Payer: Self-pay | Admitting: Adult Health

## 2021-07-28 MED ORDER — LEVOTHYROXINE SODIUM 112 MCG PO TABS: 112.0000 ug | ORAL_TABLET | Freq: Every day | ORAL | 3 refills | Status: DC

## 2021-08-05 DIAGNOSIS — H35372 Puckering of macula, left eye: Secondary | ICD-10-CM | POA: Diagnosis not present

## 2021-08-05 DIAGNOSIS — Z961 Presence of intraocular lens: Secondary | ICD-10-CM | POA: Diagnosis not present

## 2021-08-05 DIAGNOSIS — H52203 Unspecified astigmatism, bilateral: Secondary | ICD-10-CM | POA: Diagnosis not present

## 2021-09-01 DIAGNOSIS — Z20822 Contact with and (suspected) exposure to covid-19: Secondary | ICD-10-CM | POA: Diagnosis not present

## 2021-09-07 DIAGNOSIS — Z20828 Contact with and (suspected) exposure to other viral communicable diseases: Secondary | ICD-10-CM | POA: Diagnosis not present

## 2021-09-10 ENCOUNTER — Other Ambulatory Visit: Payer: Self-pay | Admitting: Cardiovascular Disease

## 2021-09-30 DIAGNOSIS — Z20822 Contact with and (suspected) exposure to covid-19: Secondary | ICD-10-CM | POA: Diagnosis not present

## 2021-10-08 DIAGNOSIS — Z20822 Contact with and (suspected) exposure to covid-19: Secondary | ICD-10-CM | POA: Diagnosis not present

## 2021-10-13 DIAGNOSIS — Z20822 Contact with and (suspected) exposure to covid-19: Secondary | ICD-10-CM | POA: Diagnosis not present

## 2021-10-26 DIAGNOSIS — Z20822 Contact with and (suspected) exposure to covid-19: Secondary | ICD-10-CM | POA: Diagnosis not present

## 2021-12-24 ENCOUNTER — Telehealth: Payer: Self-pay | Admitting: Adult Health

## 2021-12-24 ENCOUNTER — Ambulatory Visit: Payer: Self-pay

## 2021-12-24 DIAGNOSIS — E782 Mixed hyperlipidemia: Secondary | ICD-10-CM

## 2021-12-24 DIAGNOSIS — I10 Essential (primary) hypertension: Secondary | ICD-10-CM

## 2021-12-24 NOTE — Telephone Encounter (Signed)
Left message for patient to call back and schedule Medicare Annual Wellness Visit (AWV) either virtually or in office. Left  my jabber number [phone]   Last AWV ; 12/25/20 please schedule at anytime with [facility] Nurse Health Advisor 1 or 2     

## 2021-12-24 NOTE — Patient Instructions (Signed)
Visit Information Case Closed disenrolled Thank you for allowing me to share the care management and care coordination services that are available to you as part of your health plan and services through your primary care provider and medical home. Please reach out to me at [phone] if the care management/care coordination team may be of assistance to you in the future.   Peter Garter RN, Jackquline Denmark, CDE Care Management Coordinator [facility] [phone]

## 2021-12-24 NOTE — Chronic Care Management (AMB) (Signed)
  Care Management   Follow Up Note   12/24/2021 Name: Hannah Morrow MRN: [id number] DOB: [birth year]   Referred by: [name], NP Reason for referral : Chronic Care Management (Case Closed disenrolled)   Case Closed disenrolled  Follow Up Plan: No further follow up required: Case Closed disenrolled  Peter Garter RN, [facility], CDE Care Management Coordinator [place] [phone]

## 2021-12-30 ENCOUNTER — Ambulatory Visit (INDEPENDENT_AMBULATORY_CARE_PROVIDER_SITE_OTHER): Payer: Medicare Other

## 2021-12-30 VITALS — BP 120/64 | HR 61 | Temp 98.2°F | Ht 62.0 in | Wt 205.3 lb

## 2021-12-30 DIAGNOSIS — Z0000 Encounter for general adult medical examination without abnormal findings: Secondary | ICD-10-CM | POA: Diagnosis not present

## 2021-12-30 NOTE — Patient Instructions (Addendum)
Hannah Morrow , Thank you for taking time to come for your Medicare Wellness Visit. I appreciate your ongoing commitment to your health goals. Please review the following plan we discussed and let me know if I can assist you in the future.   These are the goals we discussed:  Goals       Patient Stated      To develop helpful exercises for balance and other  May try to get back into yoga!      Patient stated (pt-stated)      I would like to get back to my normal. And lose some weight.        This is a list of the screening recommended for you and due dates:  Health Maintenance  Topic Date Due   COVID-19 Vaccine (4 - Pfizer series) 01/15/2022*   Zoster (Shingles) Vaccine (1 of 2) 04/01/2022*   Flu Shot  02/02/2022   Mammogram  04/06/2022   Pneumonia Vaccine  Completed   DEXA scan (bone density measurement)  Completed   Hepatitis C Screening: USPSTF Recommendation to screen - Ages 17-79 yo.  Completed   HPV Vaccine  Aged Out   Colon Cancer Screening  Discontinued  *Topic was postponed. The date shown is not the original due date.   Advanced directives: Yes Patient will submit copy  Conditions/risks identified: None  Next appointment: Follow up in one year for your annual wellness visit     Preventive Care 65 Years and Older, Female Preventive care refers to lifestyle choices and visits with your health care provider that can promote health and wellness. What does preventive care include? A yearly physical exam. This is also called an annual well check. Dental exams once or twice a year. Routine eye exams. Ask your health care provider how often you should have your eyes checked. Personal lifestyle choices, including: Daily care of your teeth and gums. Regular physical activity. Eating a healthy diet. Avoiding tobacco and drug use. Limiting alcohol use. Practicing safe sex. Taking low-dose aspirin every day. Taking vitamin and mineral supplements as recommended by  your health care provider. What happens during an annual well check? The services and screenings done by your health care provider during your annual well check will depend on your age, overall health, lifestyle risk factors, and family history of disease. Counseling  Your health care provider may ask you questions about your: Alcohol use. Tobacco use. Drug use. Emotional well-being. Home and relationship well-being. Sexual activity. Eating habits. History of falls. Memory and ability to understand (cognition). Work and work Statistician. Reproductive health. Screening  You may have the following tests or measurements: Height, weight, and BMI. Blood pressure. Lipid and cholesterol levels. These may be checked every 5 years, or more frequently if you are over 80 years old. Skin check. Lung cancer screening. You may have this screening every year starting at age 84 if you have a 30-pack-year history of smoking and currently smoke or have quit within the past 15 years. Fecal occult blood test (FOBT) of the stool. You may have this test every year starting at age 19. Flexible sigmoidoscopy or colonoscopy. You may have a sigmoidoscopy every 5 years or a colonoscopy every 10 years starting at age 67. Hepatitis C blood test. Hepatitis B blood test. Sexually transmitted disease (STD) testing. Diabetes screening. This is done by checking your blood sugar (glucose) after you have not eaten for a while (fasting). You may have this done every 1-3 years. Bone density  scan. This is done to screen for osteoporosis. You may have this done starting at age 23. Mammogram. This may be done every 1-2 years. Talk to your health care provider about how often you should have regular mammograms. Talk with your health care provider about your test results, treatment options, and if necessary, the need for more tests. Vaccines  Your health care provider may recommend certain vaccines, such as: Influenza  vaccine. This is recommended every year. Tetanus, diphtheria, and acellular pertussis (Tdap, Td) vaccine. You may need a Td booster every 10 years. Zoster vaccine. You may need this after age 47. Pneumococcal 13-valent conjugate (PCV13) vaccine. One dose is recommended after age 15. Pneumococcal polysaccharide (PPSV23) vaccine. One dose is recommended after age 71. Talk to your health care provider about which screenings and vaccines you need and how often you need them. This information is not intended to replace advice given to you by your health care provider. Make sure you discuss any questions you have with your health care provider. Document Released: 07/18/2015 Document Revised: 03/10/2016 Document Reviewed: 04/22/2015 Elsevier Interactive Patient Education  2017 [place] Prevention in the Home Falls can cause injuries. They can happen to people of all ages. There are many things you can do to make your home safe and to help prevent falls. What can I do on the outside of my home? Regularly fix the edges of walkways and driveways and fix any cracks. Remove anything that might make you trip as you walk through a door, such as a raised step or threshold. Trim any bushes or trees on the path to your home. Use bright outdoor lighting. Clear any walking paths of anything that might make someone trip, such as rocks or tools. Regularly check to see if handrails are loose or broken. Make sure that both sides of any steps have handrails. Any raised decks and porches should have guardrails on the edges. Have any leaves, snow, or ice cleared regularly. Use sand or salt on walking paths during winter. Clean up any spills in your garage right away. This includes oil or grease spills. What can I do in the bathroom? Use night lights. Install grab bars by the toilet and in the tub and shower. Do not use towel bars as grab bars. Use non-skid mats or decals in the tub or shower. If you  need to sit down in the shower, use a plastic, non-slip stool. Keep the floor dry. Clean up any water that spills on the floor as soon as it happens. Remove soap buildup in the tub or shower regularly. Attach bath mats securely with double-sided non-slip rug tape. Do not have throw rugs and other things on the floor that can make you trip. What can I do in the bedroom? Use night lights. Make sure that you have a light by your bed that is easy to reach. Do not use any sheets or blankets that are too big for your bed. They should not hang down onto the floor. Have a firm chair that has side arms. You can use this for support while you get dressed. Do not have throw rugs and other things on the floor that can make you trip. What can I do in the kitchen? Clean up any spills right away. Avoid walking on wet floors. Keep items that you use a lot in easy-to-reach places. If you need to reach something above you, use a strong step stool that has a grab bar. Keep electrical  cords out of the way. Do not use floor polish or wax that makes floors slippery. If you must use wax, use non-skid floor wax. Do not have throw rugs and other things on the floor that can make you trip. What can I do with my stairs? Do not leave any items on the stairs. Make sure that there are handrails on both sides of the stairs and use them. Fix handrails that are broken or loose. Make sure that handrails are as long as the stairways. Check any carpeting to make sure that it is firmly attached to the stairs. Fix any carpet that is loose or worn. Avoid having throw rugs at the top or bottom of the stairs. If you do have throw rugs, attach them to the floor with carpet tape. Make sure that you have a light switch at the top of the stairs and the bottom of the stairs. If you do not have them, ask someone to add them for you. What else can I do to help prevent falls? Wear shoes that: Do not have high heels. Have rubber  bottoms. Are comfortable and fit you well. Are closed at the toe. Do not wear sandals. If you use a stepladder: Make sure that it is fully opened. Do not climb a closed stepladder. Make sure that both sides of the stepladder are locked into place. Ask someone to hold it for you, if possible. Clearly mark and make sure that you can see: Any grab bars or handrails. First and last steps. Where the edge of each step is. Use tools that help you move around (mobility aids) if they are needed. These include: Canes. Walkers. Scooters. Crutches. Turn on the lights when you go into a dark area. Replace any light bulbs as soon as they burn out. Set up your furniture so you have a clear path. Avoid moving your furniture around. If any of your floors are uneven, fix them. If there are any pets around you, be aware of where they are. Review your medicines with your doctor. Some medicines can make you feel dizzy. This can increase your chance of falling. Ask your doctor what other things that you can do to help prevent falls. This information is not intended to replace advice given to you by your health care provider. Make sure you discuss any questions you have with your health care provider. Document Released: 04/17/2009 Document Revised: 11/27/2015 Document Reviewed: 07/26/2014 Elsevier Interactive Patient Education  2017 [employer].

## 2021-12-30 NOTE — Progress Notes (Signed)
Subjective:   Hannah Morrow is a 81 y.o. female who presents for Medicare Annual (Subsequent) preventive examination.  Review of Systems     Cardiac Risk Factors include: advanced age (>39mn, >>8women);hypertension;obesity (BMI >30kg/m2)     Objective:    Today's Vitals   12/30/21 1116  BP: 120/64  Pulse: 61  Temp: 98.2 F (36.8 C)  TempSrc: Oral  SpO2: 96%  Weight: 205 lb 4.8 oz (93.1 kg)  Height: '5\' 2"'$  (1.575 m)   Body mass index is 37.55 kg/m.     12/30/2021   11:35 AM 12/25/2020    1:16 PM 12/18/2020   11:31 AM 05/25/2019   10:21 AM 11/02/2017   11:42 AM 08/04/2015   11:07 AM 06/02/2015   11:01 AM  Advanced Directives  Does Patient Have a Medical Advance Directive? Yes Yes Yes Yes Yes Yes Yes  Type of AParamedicof ASan JonLiving will HScanlonLiving will HHadarLiving will HCantonLiving will  Living will Living will  Does patient want to make changes to medical advance directive? No - Patient declined  No - Patient declined No - Patient declined  No - Patient declined   Copy of HBear Creekin Chart? No - copy requested No - copy requested No - copy requested No - copy requested  No - copy requested     Current Medications (verified) Outpatient Encounter Medications as of 12/30/2021  Medication Sig   Ascorbic Acid (VITAMIN C) 1000 MG tablet Take 1,000 mg by mouth daily.   aspirin 81 MG tablet Take 1 tablet (81 mg total) by mouth daily.   atenolol (TENORMIN) 25 MG tablet TAKE 1 TABLET DAILY   atorvastatin (LIPITOR) 40 MG tablet Take 1 tablet (40 mg total) by mouth daily.   b complex vitamins tablet Take 1 tablet by mouth daily.   calcium carbonate (TUMS - DOSED IN MG ELEMENTAL CALCIUM) 500 MG chewable tablet Chew 1 tablet by mouth daily as needed for indigestion or heartburn.    Cholecalciferol (VITAMIN D) 2000 UNITS CAPS Take by mouth.    fluocinonide ointment (LIDEX) 05.64% Apply 1 application topically daily as needed (itching).   ibuprofen (ADVIL) 200 MG tablet Take 400 mg by mouth in the morning and at bedtime.   levothyroxine (SYNTHROID) 112 MCG tablet Take 1 tablet (112 mcg total) by mouth daily.   losartan (COZAAR) 100 MG tablet TAKE 1 TABLET DAILY (Patient taking differently: 100 mg. TAKING 112 MG)   No facility-administered encounter medications on file as of 12/30/2021.    Allergies (verified) Penicillins, Coreg [carvedilol], Amlodipine, Metoprolol, Betadine [povidone iodine], Hydrocodone-acetaminophen, Lisinopril, and Tramadol   History: Past Medical History:  Diagnosis Date   Allergy    no meds   [place]    [place]    cerv[place] - [place]   GERD    Hyperlipidemia    Hypertension    Hypothyroid    Plantar fasciitis    right   RESTLESS LEGS SYNDROME    Past Surgical History:  Procedure Laterality Date   ABDOMINAL HYSTERECTOMY     BREAST SURGERY     Bilateral breast reduction   COLOSTOMY  04-22-2010   [facility]   cone bx     cervix   CORONARY ARTERY BYPASS GRAFT N/A 05/28/2019   Procedure: CORONARY ARTERY BYPASS GRAFTING (CABG) using the LIMA to LAD; Endoscopic right saphenous vein harvesting to the Ramus and Diag1.;  Surgeon:  Grace Isaac, MD;  Location: [facility];  Service: Open Heart Surgery;  Laterality: N/A;   LEFT HEART CATH AND CORONARY ANGIOGRAPHY N/A 05/25/2019   Procedure: LEFT HEART CATH AND CORONARY ANGIOGRAPHY;  Surgeon: Leonie Man, MD;  Location: [facility];  Service: Cardiovascular;  Laterality: N/A;   REDUCTION MAMMAPLASTY Bilateral    TEE WITHOUT CARDIOVERSION N/A 05/28/2019   Procedure: TRANSESOPHAGEAL ECHOCARDIOGRAM (TEE);  Surgeon: Grace Isaac, MD;  Location: [facility];  Service: Open Heart Surgery;  Laterality: N/A;   WISDOM TOOTH EXTRACTION     Family History  Problem Relation Age of Onset   Cancer Sister        melanoma- with mets   Colon  cancer Father 41   Stomach cancer Maternal Grandfather    Breast cancer Mother 55   Breast cancer Daughter 36   Rectal cancer Neg Hx    Esophageal cancer Neg Hx    Social History   Socioeconomic History   Marital status: Married    Spouse name: Not on file   Number of children: 2   Years of education: Not on file   Highest education level: Not on file  Occupational History   Not on file  Tobacco Use   Smoking status: Never   Smokeless tobacco: Never  Vaping Use   Vaping Use: Not on file  Substance and Sexual Activity   Alcohol use: Yes    Alcohol/week: 5.0 standard drinks of alcohol    Types: 5 Glasses of wine per week    Comment: Occasional   Drug use: No   Sexual activity: Yes    Partners: Male    Birth control/protection: Surgical, Other-see comments    Comment: hysterectomy  Other Topics Concern   Not on file  Social History Narrative   Married for 54 years   Two daughters, five grand children and 5 great grand children. Daughter lives in [place]. Other daughter lives in [place].    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    Exercise Does not exercise. Was going to exercise class but has since quite.    Diet: Veggies, chicken, salmon. Very little red meat. Eats lots of eggs.    Exercise --- walking , gym 3 classes a week   Social Determinants of Health   Financial Resource Strain: Low Risk  (12/30/2021)   Overall Financial Resource Strain (CARDIA)    Difficulty of Paying Living Expenses: Not hard at all  Food Insecurity: No Food Insecurity (12/30/2021)   Hunger Vital Sign    Worried About Running Out of Food in the Last Year: Never true    Ran Out of Food in the Last Year: Never true  Transportation Needs: No Transportation Needs (12/30/2021)   PRAPARE - Hydrologist (Medical): No    Lack of Transportation (Non-Medical): No  Physical Activity: Insufficiently Active (12/30/2021)    Exercise Vital Sign    Days of Exercise per Week: 7 days    Minutes of Exercise per Session: 20 min  Stress: Stress Concern Present (12/30/2021)   [place]    Feeling of Stress : To some extent  Social Connections: Moderately Integrated (12/30/2021)   Social Connection and Isolation Panel [NHANES]    Frequency of Communication with Friends and Family: More than three times a week    Frequency of Social  Gatherings with Friends and Family: More than three times a week    Attends Religious Services: More than 4 times per year    Active Member of Clubs or Organizations: Yes    Attends Archivist Meetings: More than 4 times per year    Marital Status: Widowed     Clinical Intake:  Pre-visit preparation completed: No Diabetic?  No  Interpreter Needed?: No Activities of Daily Living    12/30/2021   11:31 AM  In your present state of health, do you have any difficulty performing the following activities:  Hearing? 0  Vision? 0  Difficulty concentrating or making decisions? 0  Walking or climbing stairs? 0  Dressing or bathing? 0  Doing errands, shopping? 0  Preparing Food and eating ? N  Using the Toilet? N  In the past six months, have you accidently leaked urine? N  Do you have problems with loss of bowel control? N  Managing your Medications? N  Managing your Finances? N  Housekeeping or managing your Housekeeping? N    Patient Care Team: [name], NP as PCP - General (Family Medicine) Harriett Sine, MD as Consulting Physician (Dermatology)  Indicate any recent Medical Services you may have received from other than [facility] providers in the past year (date may be approximate).     Assessment:   This is a routine wellness examination for Hannah Morrow.  Hearing/Vision screen Hearing Screening - Comments:: No hearing difficulty Vision Screening - Comments:: Wears glasses. Followed by Dr  Prudencio Burly  Dietary issues and exercise activities discussed: Exercise limited by: None identified   Goals Addressed               This Visit's Progress     Patient stated (pt-stated)        I would like to get back to my normal. And lose some weight.       Depression Screen    12/30/2021   11:28 AM 12/25/2020    1:17 PM 12/25/2020    1:13 PM 12/18/2020   11:30 AM 12/18/2019    8:56 AM 07/28/2018   11:19 AM 07/26/2017    9:00 AM  PHQ 2/9 Scores  PHQ - 2 Score 1 0 0 0 0 0 0    Fall Risk    12/30/2021   11:33 AM 12/25/2020    1:17 PM 12/18/2020   11:27 AM 12/18/2019    8:56 AM 07/28/2018   11:19 AM  Fall Risk   Falls in the past year? 0 0 1 0 0  Number falls in past yr: 0 0 0  0  Comment   slipped in ice    Injury with Fall? 0 0 0  1  Risk for fall due to : No Fall Risks      Follow up  Falls evaluation completed Education provided;Falls prevention discussed      FALL RISK PREVENTION PERTAINING TO THE HOME:  Any stairs in or around the home? Yes  If so, are there any without handrails? No  Home free of loose throw rugs in walkways, pet beds, electrical cords, etc? Yes  Adequate lighting in your home to reduce risk of falls? Yes   ASSISTIVE DEVICES UTILIZED TO PREVENT FALLS:  Life alert? No  Use of a cane, walker or w/c? No  Grab bars in the bathroom? Yes  Shower chair or bench in shower? No  Elevated toilet seat or a handicapped toilet? Yes   TIMED UP AND GO:  Was the test performed?  Yes Gait fast  steady without use of device  Cognitive Function:      12/30/2021   11:36 AM  6CIT Screen  What Year? 0 points  What month? 0 points  What time? 0 points  Count back from 20 0 points  Months in reverse 0 points  Repeat phrase 0 points  Total Score 0 points    Immunizations Immunization History  Administered Date(s) Administered   Influenza, High Dose Seasonal PF 05/19/2015, 05/14/2016, 04/05/2017, 04/18/2018   Influenza,inj,Quad PF,6+ Mos 05/13/2014    Influenza-Unspecified 04/11/2020, 03/29/2021   PFIZER(Purple Top)SARS-COV-2 Vaccination 07/27/2019, 08/14/2019, 03/29/2021   Pneumococcal Conjugate-13 05/13/2014   Pneumococcal Polysaccharide-23 04/23/2013   Td 12/10/2008   Zoster, Live 10/06/2012   Flu Vaccine status: Up to date  Pneumococcal vaccine status: Up to date  Covid-19 vaccine status: Completed vaccines  Qualifies for Shingles Vaccine? Yes   Zostavax completed No   Shingrix Completed?: No.    Education has been provided regarding the importance of this vaccine. Patient has been advised to call insurance company to determine out of pocket expense if they have not yet received this vaccine. Advised may also receive vaccine at local pharmacy or Health Dept. Verbalized acceptance and understanding.  Screening Tests Health Maintenance  Topic Date Due   COVID-19 Vaccine (4 - Pfizer series) 01/15/2022 (Originally 05/24/2021)   Zoster Vaccines- Shingrix (1 of 2) 04/01/2022 (Originally 03/23/1992)   INFLUENZA VACCINE  02/02/2022   MAMMOGRAM  04/06/2022   Pneumonia Vaccine 81+ Years old  Completed   DEXA SCAN  Completed   Hepatitis C Screening  Completed   HPV VACCINES  Aged Out   COLONOSCOPY (Pts 45-83yr Insurance coverage will need to be confirmed)  Discontinued    Health Maintenance  There are no preventive care reminders to display for this patient.   Colorectal cancer screening: No longer required.   Mammogram status: No longer required due to Age.  Bone Density status: Completed 01/14/21. Results reflect: Bone density results: OSTEOPOROSIS. Repeat every   years.  Lung Cancer Screening: (Low Dose CT Chest recommended if Age 80-80years, 30 pack-year currently smoking OR have quit w/in 15years.) does not qualify.     Additional Screening:  Hepatitis C Screening: does not qualify; Completed   Vision Screening: Recommended annual ophthalmology exams for early detection of glaucoma and other disorders of the  eye. Is the patient up to date with their annual eye exam?  Yes  Who is the provider or what is the name of the office in which the patient attends annual eye exams? Dr LPrudencio BurlyIf pt is not established with a provider, would they like to be referred to a provider to establish care? No .   Dental Screening: Recommended annual dental exams for proper oral hygiene  Community Resource Referral / Chronic Care Management:   CRR required this visit?  No   CCM required this visit?  No      Plan:     I have personally reviewed and noted the following in the patient's chart:   Medical and social history Use of alcohol, tobacco or illicit drugs  Current medications and supplements including opioid prescriptions.  Functional ability and status Nutritional status Physical activity Advanced directives List of other physicians Hospitalizations, surgeries, and ER visits in previous 12 months Vitals Screenings to include cognitive, depression, and falls Referrals and appointments  In addition, I have reviewed and discussed with patient certain preventive protocols, quality metrics, and best  practice recommendations. A written personalized care plan for preventive services as well as general preventive health recommendations were provided to patient.     Hannah Peaches, LPN   [id number]   Nurse Notes: Patient request f/u with concerns of being overly tired and feeling weak in bilateral lower extremity, also lower back pain when walking and standing.

## 2022-01-13 ENCOUNTER — Ambulatory Visit (INDEPENDENT_AMBULATORY_CARE_PROVIDER_SITE_OTHER): Payer: Medicare Other | Admitting: Adult Health

## 2022-01-13 ENCOUNTER — Encounter: Payer: Self-pay | Admitting: Adult Health

## 2022-01-13 VITALS — BP 130/82 | HR 59 | Temp 98.0°F | Ht 62.0 in | Wt 203.0 lb

## 2022-01-13 DIAGNOSIS — M159 Polyosteoarthritis, unspecified: Secondary | ICD-10-CM

## 2022-01-13 DIAGNOSIS — F320 Major depressive disorder, single episode, mild: Secondary | ICD-10-CM

## 2022-01-13 DIAGNOSIS — R5383 Other fatigue: Secondary | ICD-10-CM | POA: Diagnosis not present

## 2022-01-13 MED ORDER — BUPROPION HCL ER (XL) 150 MG PO TB24: 150.0000 mg | ORAL_TABLET | Freq: Every day | ORAL | 0 refills | Status: DC

## 2022-01-13 NOTE — Progress Notes (Signed)
Subjective:    Patient ID: Hannah Morrow, female    DOB: 03-17-1941, 81 y.o.   MRN: [id number]  HPI 81 year old female who  has a past medical history of Allergy, ANXIETY, Cancer ([place]), GERD, Hyperlipidemia, Hypertension, Hypothyroid, Plantar fasciitis, and RESTLESS LEGS SYNDROME.  She presents to the office today for complaint of "fatigue, lack of energy, decreased desire and motivation".  Symptoms have been present for the last few months or longer.  She does report that she cries often at home but not as often as she did after her husband died approximately a year ago.  Most of her crying spells are from anything having to do with animals or pets.  Reports that she will get up and walk her dog twice a day but is only able to go about a half a block due to both lack of energy as well as low back and bilateral hip discomfort.  She has joined a bridge group that meets on Mondays.  It sounds like other than that she is not getting out from around her house much.  She is taking her blood pressure and thyroid medications daily   Review of Systems See HPI   Past Medical History:  Diagnosis Date   Allergy    no meds   ANXIETY    Cancer ([place])    cervical cancer - hysterectomy   GERD    Hyperlipidemia    Hypertension    Hypothyroid    Plantar fasciitis    right   RESTLESS LEGS SYNDROME     Social History   Socioeconomic History   Marital status: Married    Spouse name: Not on file   Number of children: 2   Years of education: Not on file   Highest education level: Not on file  Occupational History   Not on file  Tobacco Use   Smoking status: Never   Smokeless tobacco: Never  Vaping Use   Vaping Use: Not on file  Substance and Sexual Activity   Alcohol use: Yes    Alcohol/week: 5.0 standard drinks of alcohol    Types: 5 Glasses of wine per week    Comment: Occasional   Drug use: No   Sexual activity: Yes    Partners: Male    Birth control/protection:  Surgical, Other-see comments    Comment: hysterectomy  Other Topics Concern   Not on file  Social History Narrative   Married for 49 years   Two daughters, five grand children and 5 great grand children. Daughter lives in [place]. Other daughter lives in [place].    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    Exercise Does not exercise. Was going to exercise class but has since quite.    Diet: Veggies, chicken, salmon. Very little red meat. Eats lots of eggs.    Exercise --- walking , gym 3 classes a week   Social Determinants of Health   Financial Resource Strain: Low Risk  (12/30/2021)   Overall Financial Resource Strain (CARDIA)    Difficulty of Paying Living Expenses: Not hard at all  Food Insecurity: No Food Insecurity (12/30/2021)   Hunger Vital Sign    Worried About Running Out of Food in the Last Year: Never true    Ran Out of Food in the Last Year: Never true  Transportation Needs: No Transportation Needs (12/30/2021)   PRAPARE - Transportation  Lack of Transportation (Medical): No    Lack of Transportation (Non-Medical): No  Physical Activity: Insufficiently Active (12/30/2021)   Exercise Vital Sign    Days of Exercise per Week: 7 days    Minutes of Exercise per Session: 20 min  Stress: Stress Concern Present (12/30/2021)   [place]    Feeling of Stress : To some extent  Social Connections: Moderately Integrated (12/30/2021)   Social Connection and Isolation Panel [NHANES]    Frequency of Communication with Friends and Family: More than three times a week    Frequency of Social Gatherings with Friends and Family: More than three times a week    Attends Religious Services: More than 4 times per year    Active Member of [employer] or Organizations: Yes    Attends Archivist Meetings: More than 4 times per year    Marital Status: Widowed  Intimate  Partner Violence: Not At Risk (12/30/2021)   Humiliation, Afraid, Rape, and Kick questionnaire    Fear of Current or Ex-Partner: No    Emotionally Abused: No    Physically Abused: No    Sexually Abused: No    Past Surgical History:  Procedure Laterality Date   ABDOMINAL HYSTERECTOMY     BREAST SURGERY     Bilateral breast reduction   COLOSTOMY  04-22-2010   [facility]   cone bx     cervix   CORONARY ARTERY BYPASS GRAFT N/A 05/28/2019   Procedure: CORONARY ARTERY BYPASS GRAFTING (CABG) using the LIMA to LAD; Endoscopic right saphenous vein harvesting to the Ramus and Diag1.;  Surgeon: Grace Isaac, MD;  Location: [facility];  Service: Open Heart Surgery;  Laterality: N/A;   LEFT HEART CATH AND CORONARY ANGIOGRAPHY N/A 05/25/2019   Procedure: LEFT HEART CATH AND CORONARY ANGIOGRAPHY;  Surgeon: Leonie Man, MD;  Location: [facility];  Service: Cardiovascular;  Laterality: N/A;   REDUCTION MAMMAPLASTY Bilateral    TEE WITHOUT CARDIOVERSION N/A 05/28/2019   Procedure: TRANSESOPHAGEAL ECHOCARDIOGRAM (TEE);  Surgeon: Grace Isaac, MD;  Location: [facility];  Service: Open Heart Surgery;  Laterality: N/A;   WISDOM TOOTH EXTRACTION      Family History  Problem Relation Age of Onset   Cancer Sister        melanoma- with mets   Colon cancer Father 59   Stomach cancer Maternal Grandfather    Breast cancer Mother 22   Breast cancer Daughter 68   Rectal cancer Neg Hx    Esophageal cancer Neg Hx     Allergies  Allergen Reactions   Penicillins Shortness Of Breath    red face Did it involve swelling of the face/tongue/throat, SOB, or low BP? Yes Did it involve sudden or severe rash/hives, skin peeling, or any reaction on the inside of your mouth or nose? Yes Did you need to seek medical attention at a hospital or doctor's office? Yes When did it last happen?      40+ years If all above answers are "NO", may proceed with cephalosporin use.    Coreg [Carvedilol] Rash    Amlodipine     Pt states it made her feel "awful and weird."   Metoprolol Other (See Comments)    " feel weird"   Betadine [Povidone Iodine] Rash   Hydrocodone-Acetaminophen Rash    ras   Lisinopril     Fatigue    Tramadol Itching and Rash    Current Outpatient Medications  on File Prior to Visit  Medication Sig Dispense Refill   Ascorbic Acid (VITAMIN C) 1000 MG tablet Take 1,000 mg by mouth daily.     aspirin 81 MG tablet Take 1 tablet (81 mg total) by mouth daily.     atenolol (TENORMIN) 25 MG tablet TAKE 1 TABLET DAILY 90 tablet 3   atorvastatin (LIPITOR) 40 MG tablet Take 1 tablet (40 mg total) by mouth daily. 90 tablet 3   b complex vitamins tablet Take 1 tablet by mouth daily.     calcium carbonate (TUMS - DOSED IN MG ELEMENTAL CALCIUM) 500 MG chewable tablet Chew 1 tablet by mouth daily as needed for indigestion or heartburn.      Cholecalciferol (VITAMIN D) 2000 UNITS CAPS Take by mouth.     fluocinonide ointment (LIDEX) 5.17 % Apply 1 application topically daily as needed (itching). 60 g 1   ibuprofen (ADVIL) 200 MG tablet Take 400 mg by mouth in the morning and at bedtime.     levothyroxine (SYNTHROID) 112 MCG tablet Take 1 tablet (112 mcg total) by mouth daily. 90 tablet 3   losartan (COZAAR) 100 MG tablet TAKE 1 TABLET DAILY (Patient taking differently: 100 mg. TAKING 112 MG) 90 tablet 3   No current facility-administered medications on file prior to visit.    BP 130/82   Pulse (!) 59   Temp 98 F (36.7 C) (Oral)   Ht '5\' 2"'$  (1.575 m)   Wt 203 lb (92.1 kg)   SpO2 98%   BMI 37.13 kg/m       Objective:   Physical Exam Vitals and nursing note reviewed.  Constitutional:      Appearance: Normal appearance.  Cardiovascular:     Rate and Rhythm: Normal rate and regular rhythm.     Pulses: Normal pulses.     Heart sounds: Normal heart sounds.  Pulmonary:     Effort: Pulmonary effort is normal.     Breath sounds: Normal breath sounds.  Musculoskeletal:         General: Normal range of motion.  Skin:    General: Skin is warm and dry.     Capillary Refill: Capillary refill takes less than 2 seconds.  Neurological:     General: No focal deficit present.     Mental Status: She is alert and oriented to person, place, and time.  Psychiatric:        Mood and Affect: Mood normal.        Behavior: Behavior normal.        Thought Content: Thought content normal.        Judgment: Judgment normal.        Assessment & Plan:  1. Other fatigue -I believe this is likely multifactorial but more related to depression.  We discussed starting on Wellbutrin and she is okay with this.  I would like her to start exercising more.  She would like to hold off on physical therapy or going to a gym.  I will have her follow-up in 30 days to see how the Wellbutrin is working for her  2. Depression, major, single episode, mild (HCC)  - buPROPion (WELLBUTRIN XL) 150 MG 24 hr tablet; Take 1 tablet (150 mg total) by mouth daily.  Dispense: 90 tablet; Refill: 0  3. Primary osteoarthritis involving multiple joints - encouraged more aerobic exercise   [name], NP

## 2022-02-12 ENCOUNTER — Ambulatory Visit (INDEPENDENT_AMBULATORY_CARE_PROVIDER_SITE_OTHER): Payer: Medicare Other | Admitting: Adult Health

## 2022-02-12 ENCOUNTER — Encounter: Payer: Self-pay | Admitting: Adult Health

## 2022-02-12 VITALS — BP 132/80 | HR 62 | Temp 97.8°F | Ht 62.0 in | Wt 202.0 lb

## 2022-02-12 DIAGNOSIS — F320 Major depressive disorder, single episode, mild: Secondary | ICD-10-CM

## 2022-02-12 NOTE — Progress Notes (Signed)
Subjective:    Patient ID: Hannah Morrow, female    DOB: 04/24/1941, 81 y.o.   MRN: [id number]  HPI  81 year old female who  has a past medical history of Allergy, ANXIETY, Cancer ([place]), GERD, Hyperlipidemia, Hypertension, Hypothyroid, Plantar fasciitis, and RESTLESS LEGS SYNDROME.  She presents to the office today for 1 month follow-up regarding fatigue and depression.  When she was last seen she reported decreased desire and motivation, lack of energy, and fatigue for a few months or longer.  At this time she reported that she cries often at home but not as often as after her husband died approximately a year ago.  Most of her crying spells had to do with anything that dealt with animals or pets.   Was thought that the depression was causing her fatigue aspect and we started her on Wellbutrin 150 mg extended release daily.  Today she reports that she took the medication for about three days but stopped it due to insomnia and feeling more anxious. A few days after stopping it she decided to try it again and has not had any issues since. She feels much better, is no longer having crying spells, feels as though her mood has improved and feels more rested.   She is still not leaving the house much though       Review of Systems See HPI   Past Medical History:  Diagnosis Date   Allergy    no meds   ANXIETY    Cancer ([place])    cervical cancer - hysterectomy   GERD    Hyperlipidemia    Hypertension    Hypothyroid    Plantar fasciitis    right   RESTLESS LEGS SYNDROME     Social History   Socioeconomic History   Marital status: Widowed    Spouse name: Not on file   Number of children: 2   Years of education: Not on file   Highest education level: Not on file  Occupational History   Not on file  Tobacco Use   Smoking status: Never   Smokeless tobacco: Never  Vaping Use   Vaping Use: Not on file  Substance and Sexual Activity   Alcohol use: Yes     Alcohol/week: 5.0 standard drinks of alcohol    Types: 5 Glasses of wine per week    Comment: Occasional   Drug use: No   Sexual activity: Yes    Partners: Male    Birth control/protection: Surgical, Other-see comments    Comment: hysterectomy  Other Topics Concern   Not on file  Social History Narrative   Married for 46 years   Two daughters, five grand children and 5 great grand children. Daughter lives in [place]. Other daughter lives in [place].    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    Exercise Does not exercise. Was going to exercise class but has since quite.    Diet: Veggies, chicken, salmon. Very little red meat. Eats lots of eggs.    Exercise --- walking , gym 3 classes a week   Social Determinants of Health   Financial Resource Strain: Low Risk  (12/30/2021)   Overall Financial Resource Strain (CARDIA)    Difficulty of Paying Living Expenses: Not hard at all  Food Insecurity: No Food Insecurity (12/30/2021)   Hunger Vital Sign    Worried About Running Out of Food in  the Last Year: Never true    [place] in the Last Year: Never true  Transportation Needs: No Transportation Needs (12/30/2021)   PRAPARE - Hydrologist (Medical): No    Lack of Transportation (Non-Medical): No  Physical Activity: Insufficiently Active (12/30/2021)   Exercise Vital Sign    Days of Exercise per Week: 7 days    Minutes of Exercise per Session: 20 min  Stress: Stress Concern Present (12/30/2021)   [place]    Feeling of Stress : To some extent  Social Connections: Moderately Integrated (12/30/2021)   Social Connection and Isolation Panel [NHANES]    Frequency of Communication with Friends and Family: More than three times a week    Frequency of Social Gatherings with Friends and Family: More than three times a week    Attends Religious  Services: More than 4 times per year    Active Member of [employer] or Organizations: Yes    Attends Archivist Meetings: More than 4 times per year    Marital Status: Widowed  Intimate Partner Violence: Not At Risk (12/30/2021)   Humiliation, Afraid, Rape, and Kick questionnaire    Fear of Current or Ex-Partner: No    Emotionally Abused: No    Physically Abused: No    Sexually Abused: No    Past Surgical History:  Procedure Laterality Date   ABDOMINAL HYSTERECTOMY     BREAST SURGERY     Bilateral breast reduction   COLOSTOMY  04-22-2010   [facility]   cone bx     cervix   CORONARY ARTERY BYPASS GRAFT N/A 05/28/2019   Procedure: CORONARY ARTERY BYPASS GRAFTING (CABG) using the LIMA to LAD; Endoscopic right saphenous vein harvesting to the Ramus and Diag1.;  Surgeon: Grace Isaac, MD;  Location: [facility];  Service: Open Heart Surgery;  Laterality: N/A;   LEFT HEART CATH AND CORONARY ANGIOGRAPHY N/A 05/25/2019   Procedure: LEFT HEART CATH AND CORONARY ANGIOGRAPHY;  Surgeon: Leonie Man, MD;  Location: [facility];  Service: Cardiovascular;  Laterality: N/A;   REDUCTION MAMMAPLASTY Bilateral    TEE WITHOUT CARDIOVERSION N/A 05/28/2019   Procedure: TRANSESOPHAGEAL ECHOCARDIOGRAM (TEE);  Surgeon: Grace Isaac, MD;  Location: [facility];  Service: Open Heart Surgery;  Laterality: N/A;   WISDOM TOOTH EXTRACTION      Family History  Problem Relation Age of Onset   Cancer Sister        melanoma- with mets   Colon cancer Father 19   Stomach cancer Maternal Grandfather    Breast cancer Mother 8   Breast cancer Daughter 64   Rectal cancer Neg Hx    Esophageal cancer Neg Hx     Allergies  Allergen Reactions   Penicillins Shortness Of Breath    red face Did it involve swelling of the face/tongue/throat, SOB, or low BP? Yes Did it involve sudden or severe rash/hives, skin peeling, or any reaction on the inside of your mouth or nose? Yes Did you need to seek medical  attention at a hospital or doctor's office? Yes When did it last happen?      40+ years If all above answers are "NO", may proceed with cephalosporin use.    Coreg [Carvedilol] Rash   Amlodipine     Pt states it made her feel "awful and weird."   Metoprolol Other (See Comments)    " feel weird"  Betadine [Povidone Iodine] Rash   Hydrocodone-Acetaminophen Rash    ras   Lisinopril     Fatigue    Tramadol Itching and Rash    Current Outpatient Medications on File Prior to Visit  Medication Sig Dispense Refill   Ascorbic Acid (VITAMIN C) 1000 MG tablet Take 1,000 mg by mouth daily.     aspirin 81 MG tablet Take 1 tablet (81 mg total) by mouth daily.     atenolol (TENORMIN) 25 MG tablet TAKE 1 TABLET DAILY 90 tablet 3   atorvastatin (LIPITOR) 40 MG tablet Take 1 tablet (40 mg total) by mouth daily. 90 tablet 3   b complex vitamins tablet Take 1 tablet by mouth daily.     buPROPion (WELLBUTRIN XL) 150 MG 24 hr tablet Take 1 tablet (150 mg total) by mouth daily. 90 tablet 0   calcium carbonate (TUMS - DOSED IN MG ELEMENTAL CALCIUM) 500 MG chewable tablet Chew 1 tablet by mouth daily as needed for indigestion or heartburn.      Cholecalciferol (VITAMIN D) 2000 UNITS CAPS Take by mouth.     fluocinonide ointment (LIDEX) 5.88 % Apply 1 application topically daily as needed (itching). 60 g 1   ibuprofen (ADVIL) 200 MG tablet Take 400 mg by mouth in the morning and at bedtime.     levothyroxine (SYNTHROID) 112 MCG tablet Take 1 tablet (112 mcg total) by mouth daily. 90 tablet 3   losartan (COZAAR) 100 MG tablet TAKE 1 TABLET DAILY (Patient taking differently: 100 mg. TAKING 112 MG) 90 tablet 3   No current facility-administered medications on file prior to visit.    BP 132/80   Pulse 62   Temp 97.8 F (36.6 C) (Oral)   Ht '5\' 2"'$  (1.575 m)   Wt 202 lb (91.6 kg)   SpO2 98%   BMI 36.95 kg/m       Objective:   Physical Exam Vitals and nursing note reviewed.  Constitutional:       Appearance: Normal appearance.  Cardiovascular:     Rate and Rhythm: Normal rate and regular rhythm.     Pulses: Normal pulses.     Heart sounds: Normal heart sounds.  Pulmonary:     Effort: Pulmonary effort is normal.     Breath sounds: Normal breath sounds.  Musculoskeletal:        General: Normal range of motion.  Skin:    General: Skin is warm and dry.  Neurological:     General: No focal deficit present.     Mental Status: She is alert and oriented to person, place, and time.  Psychiatric:        Mood and Affect: Mood normal.        Behavior: Behavior normal.        Thought Content: Thought content normal.        Judgment: Judgment normal.       Assessment & Plan:  1. Depression, major, single episode, mild ([place]) - She appears much happier during todays visit. I would like for her to get out of the house more often and find a new hobby that she would enjoy. Since she has a love for animals, I encouraged her to volunteer with the [place]. She is interested in this   [name], NP

## 2022-02-22 ENCOUNTER — Other Ambulatory Visit: Payer: Self-pay | Admitting: Adult Health

## 2022-02-22 DIAGNOSIS — Z1231 Encounter for screening mammogram for malignant neoplasm of breast: Secondary | ICD-10-CM

## 2022-03-08 ENCOUNTER — Other Ambulatory Visit: Payer: Self-pay | Admitting: Adult Health

## 2022-03-17 DIAGNOSIS — B0089 Other herpesviral infection: Secondary | ICD-10-CM | POA: Diagnosis not present

## 2022-03-17 DIAGNOSIS — Z85828 Personal history of other malignant neoplasm of skin: Secondary | ICD-10-CM | POA: Diagnosis not present

## 2022-03-17 DIAGNOSIS — L814 Other melanin hyperpigmentation: Secondary | ICD-10-CM | POA: Diagnosis not present

## 2022-03-17 DIAGNOSIS — D1801 Hemangioma of skin and subcutaneous tissue: Secondary | ICD-10-CM | POA: Diagnosis not present

## 2022-03-17 DIAGNOSIS — L821 Other seborrheic keratosis: Secondary | ICD-10-CM | POA: Diagnosis not present

## 2022-03-26 ENCOUNTER — Encounter: Payer: Self-pay | Admitting: Adult Health

## 2022-03-26 DIAGNOSIS — F320 Major depressive disorder, single episode, mild: Secondary | ICD-10-CM

## 2022-03-26 MED ORDER — BUPROPION HCL ER (XL) 150 MG PO TB24: 150.0000 mg | ORAL_TABLET | Freq: Every day | ORAL | 0 refills | Status: DC

## 2022-03-26 NOTE — Telephone Encounter (Signed)
Spoke to pt on the phone and advised that I will change the medication to a 90 day supply after realizing it need to go to mail order. I advised pt to return call to schedule a CPE. Pt verbalized understanding.

## 2022-03-29 ENCOUNTER — Encounter: Payer: Self-pay | Admitting: Adult Health

## 2022-03-29 DIAGNOSIS — Z23 Encounter for immunization: Secondary | ICD-10-CM | POA: Diagnosis not present

## 2022-03-31 DIAGNOSIS — Z85828 Personal history of other malignant neoplasm of skin: Secondary | ICD-10-CM | POA: Diagnosis not present

## 2022-03-31 DIAGNOSIS — L237 Allergic contact dermatitis due to plants, except food: Secondary | ICD-10-CM | POA: Diagnosis not present

## 2022-04-07 ENCOUNTER — Ambulatory Visit
Admission: RE | Admit: 2022-04-07 | Discharge: 2022-04-07 | Disposition: A | Discharge status: Home / Self Care | Payer: Medicare Other | Source: Ambulatory Visit | Attending: Adult Health | Admitting: Adult Health

## 2022-04-07 DIAGNOSIS — Z1231 Encounter for screening mammogram for malignant neoplasm of breast: Secondary | ICD-10-CM

## 2022-04-09 ENCOUNTER — Ambulatory Visit (INDEPENDENT_AMBULATORY_CARE_PROVIDER_SITE_OTHER): Payer: Medicare Other | Admitting: Adult Health

## 2022-04-09 ENCOUNTER — Other Ambulatory Visit: Payer: Self-pay | Admitting: Adult Health

## 2022-04-09 VITALS — BP 126/90 | HR 60 | Temp 96.6°F | Ht 61.0 in | Wt 197.6 lb

## 2022-04-09 DIAGNOSIS — I10 Essential (primary) hypertension: Secondary | ICD-10-CM | POA: Diagnosis not present

## 2022-04-09 DIAGNOSIS — R7301 Impaired fasting glucose: Secondary | ICD-10-CM

## 2022-04-09 DIAGNOSIS — Z951 Presence of aortocoronary bypass graft: Secondary | ICD-10-CM

## 2022-04-09 DIAGNOSIS — E039 Hypothyroidism, unspecified: Secondary | ICD-10-CM

## 2022-04-09 DIAGNOSIS — I6522 Occlusion and stenosis of left carotid artery: Secondary | ICD-10-CM | POA: Diagnosis not present

## 2022-04-09 DIAGNOSIS — E782 Mixed hyperlipidemia: Secondary | ICD-10-CM | POA: Diagnosis not present

## 2022-04-09 DIAGNOSIS — F320 Major depressive disorder, single episode, mild: Secondary | ICD-10-CM

## 2022-04-09 LAB — COMPREHENSIVE METABOLIC PANEL
ALT: 83 U/L — ABNORMAL HIGH (ref 0–35)
AST: 55 U/L — ABNORMAL HIGH (ref 0–37)
Albumin: 4.6 g/dL (ref 3.5–5.2)
Alkaline Phosphatase: 58 U/L (ref 39–117)
BUN: 25 mg/dL — ABNORMAL HIGH (ref 6–23)
CO2: 27 mEq/L (ref 19–32)
Calcium: 10 mg/dL (ref 8.4–10.5)
Chloride: 103 mEq/L (ref 96–112)
Creatinine, Ser: 1.44 mg/dL — ABNORMAL HIGH (ref 0.40–1.20)
GFR: 34.46 mL/min — ABNORMAL LOW (ref >60.00)
Glucose, Bld: 113 mg/dL — ABNORMAL HIGH (ref 70–99)
Potassium: 4.7 mEq/L (ref 3.5–5.1)
Sodium: 138 mEq/L (ref 135–145)
Total Bilirubin: 1.2 mg/dL (ref 0.2–1.2)
Total Protein: 7.8 g/dL (ref 6.0–8.3)

## 2022-04-09 LAB — CBC WITH DIFFERENTIAL/PLATELET
Basophils Absolute: 0.1 10*3/uL (ref 0.0–0.1)
Basophils Relative: 1.1 % (ref 0.0–3.0)
Eosinophils Absolute: 0.3 10*3/uL (ref 0.0–0.7)
Eosinophils Relative: 4.2 % (ref 0.0–5.0)
HCT: 45.4 % (ref 36.0–46.0)
Hemoglobin: 14.5 g/dL (ref 12.0–15.0)
Lymphocytes Relative: 32.4 % (ref 12.0–46.0)
Lymphs Abs: 2.4 10*3/uL (ref 0.7–4.0)
MCHC: 32 g/dL (ref 30.0–36.0)
MCV: 80.4 fl (ref 78.0–100.0)
Monocytes Absolute: 0.8 10*3/uL (ref 0.1–1.0)
Monocytes Relative: 10.9 % (ref 3.0–12.0)
Neutro Abs: 3.7 10*3/uL (ref 1.4–7.7)
Neutrophils Relative %: 51.4 % (ref 43.0–77.0)
Platelets: 217 10*3/uL (ref 150.0–400.0)
RBC: 5.64 Mil/uL — ABNORMAL HIGH (ref 3.87–5.11)
RDW: 13.9 % (ref 11.5–15.5)
WBC: 7.3 10*3/uL (ref 4.0–10.5)

## 2022-04-09 LAB — TSH: TSH: 4.42 u[IU]/mL (ref 0.35–5.50)

## 2022-04-09 LAB — HEMOGLOBIN A1C: Hgb A1c MFr Bld: 5.7 % (ref 4.6–6.5)

## 2022-04-09 LAB — LIPID PANEL
Cholesterol: 152 mg/dL (ref 0–200)
HDL: 50.4 mg/dL (ref >39.00)
LDL Cholesterol: 66 mg/dL (ref 0–99)
NonHDL: 101.11
Total CHOL/HDL Ratio: 3
Triglycerides: 178 mg/dL — ABNORMAL HIGH (ref 0.0–149.0)
VLDL: 35.6 mg/dL (ref 0.0–40.0)

## 2022-04-09 NOTE — Progress Notes (Signed)
Subjective:    Patient ID: Hannah Morrow, female    DOB: April 09, 1941, 81 y.o.   MRN: [id number]  HPI Patient presents for yearly preventative medicine examination. She is a pleasant 81 year old female who  has a past medical history of Allergy, ANXIETY, Cancer ([place]), GERD, Hyperlipidemia, Hypertension, Hypothyroid, Plantar fasciitis, and RESTLESS LEGS SYNDROME.  Depression -managed with Wellbutrin 150 mg extended release daily. She reports that she feels well controlled. No longer having crying spells and getting out of the house more often   Hyperlipidemia-prescribed Lipitor 40 mg daily.  She denies myalgia or fatigue  hypertension -takes atenolol 25 mg daily and Cozaar 100 mg daily.  She denies dizziness, lightheadedness, chest pain, or shortness of breath.  She does monitor her blood pressure at home with readings in the 629U systolic consistently  Hypothyroidism -managed with Synthroid 112 mcg daily Lab Results  Component Value Date   TSH 3.90 03/06/2021   CAD-status post coronary artery bypass grafting x3 in November 2020.  She is prescribed Lipitor 40 mg daily and aspirin 81 mg.  She denies chest pain, shortness of breath, dyspnea on exertion, lower extremity edema, or palpitations.  All immunizations and health maintenance protocols were reviewed with the patient and needed orders were placed. Will get shingles and covid vaccination at pharmacy    Appropriate screening laboratory values were ordered for the patient including screening of hyperlipidemia, renal function and hepatic function.   Medication reconciliation,  past medical history, social history, problem list and allergies were reviewed in detail with the patient  Goals were established with regard to weight loss, exercise, and  diet in compliance with medications   Review of Systems  Constitutional: Negative.   HENT: Negative.    Eyes: Negative.   Respiratory: Negative.    Cardiovascular: Negative.    Gastrointestinal: Negative.   Endocrine: Negative.   Genitourinary: Negative.   Musculoskeletal: Negative.   Skin: Negative.   Allergic/Immunologic: Negative.   Neurological: Negative.   Hematological: Negative.   Psychiatric/Behavioral:  Positive for sleep disturbance (chronic).    Past Medical History:  Diagnosis Date   Allergy    no meds   ANXIETY    Cancer ([place])    cervical cancer - hysterectomy   GERD    Hyperlipidemia    Hypertension    Hypothyroid    Plantar fasciitis    right   RESTLESS LEGS SYNDROME     Social History   Socioeconomic History   Marital status: Widowed    Spouse name: Not on file   Number of children: 2   Years of education: Not on file   Highest education level: Not on file  Occupational History   Not on file  Tobacco Use   Smoking status: Never   Smokeless tobacco: Never  Vaping Use   Vaping Use: Not on file  Substance and Sexual Activity   Alcohol use: Yes    Alcohol/week: 5.0 standard drinks of alcohol    Types: 5 Glasses of wine per week    Comment: Occasional   Drug use: No   Sexual activity: Yes    Partners: Male    Birth control/protection: Surgical, Other-see comments    Comment: hysterectomy  Other Topics Concern   Not on file  Social History Narrative   Married for 28 years   Two daughters, five grand children and 5 great grand children. Daughter lives in [place]. Other daughter lives in [place].    No longer working, was  an OR tech as LPN    Hobbies: paint, sew ( makes dolls clothes), read.    Exercise Does not exercise. Was going to exercise class but has since quite.    Diet: Veggies, chicken, salmon. Very little red meat. Eats lots of eggs.    Exercise --- walking , gym 3 classes a week   Social Determinants of Health   Financial Resource Strain: Low Risk  (12/30/2021)   Overall Financial Resource Strain (CARDIA)    Difficulty of Paying Living Expenses: Not hard at all  Food Insecurity: No Food Insecurity  (12/30/2021)   Hunger Vital Sign    Worried About Running Out of Food in the Last Year: Never true    Ran Out of Food in the Last Year: Never true  Transportation Needs: No Transportation Needs (12/30/2021)   PRAPARE - Hydrologist (Medical): No    Lack of Transportation (Non-Medical): No  Physical Activity: Insufficiently Active (12/30/2021)   Exercise Vital Sign    Days of Exercise per Week: 7 days    Minutes of Exercise per Session: 20 min  Stress: Stress Concern Present (12/30/2021)   [place]    Feeling of Stress : To some extent  Social Connections: Moderately Integrated (12/30/2021)   Social Connection and Isolation Panel [NHANES]    Frequency of Communication with Friends and Family: More than three times a week    Frequency of Social Gatherings with Friends and Family: More than three times a week    Attends Religious Services: More than 4 times per year    Active Member of [employer] or Organizations: Yes    Attends Archivist Meetings: More than 4 times per year    Marital Status: Widowed  Intimate Partner Violence: Not At Risk (12/30/2021)   Humiliation, Afraid, Rape, and Kick questionnaire    Fear of Current or Ex-Partner: No    Emotionally Abused: No    Physically Abused: No    Sexually Abused: No    Past Surgical History:  Procedure Laterality Date   ABDOMINAL HYSTERECTOMY     BREAST SURGERY     Bilateral breast reduction   COLOSTOMY  04-22-2010   [facility]   cone bx     cervix   CORONARY ARTERY BYPASS GRAFT N/A 05/28/2019   Procedure: CORONARY ARTERY BYPASS GRAFTING (CABG) using the LIMA to LAD; Endoscopic right saphenous vein harvesting to the Ramus and Diag1.;  Surgeon: Grace Isaac, MD;  Location: [facility];  Service: Open Heart Surgery;  Laterality: N/A;   LEFT HEART CATH AND CORONARY ANGIOGRAPHY N/A 05/25/2019   Procedure: LEFT HEART CATH AND CORONARY  ANGIOGRAPHY;  Surgeon: Leonie Man, MD;  Location: [facility];  Service: Cardiovascular;  Laterality: N/A;   REDUCTION MAMMAPLASTY Bilateral    TEE WITHOUT CARDIOVERSION N/A 05/28/2019   Procedure: TRANSESOPHAGEAL ECHOCARDIOGRAM (TEE);  Surgeon: Grace Isaac, MD;  Location: [facility];  Service: Open Heart Surgery;  Laterality: N/A;   WISDOM TOOTH EXTRACTION      Family History  Problem Relation Age of Onset   Cancer Sister        melanoma- with mets   Colon cancer Father 42   Stomach cancer Maternal Grandfather    Breast cancer Mother 66   Breast cancer Daughter 47   Rectal cancer Neg Hx    Esophageal cancer Neg Hx     Allergies  Allergen Reactions  Penicillins Shortness Of Breath    red face Did it involve swelling of the face/tongue/throat, SOB, or low BP? Yes Did it involve sudden or severe rash/hives, skin peeling, or any reaction on the inside of your mouth or nose? Yes Did you need to seek medical attention at a hospital or doctor's office? Yes When did it last happen?      40+ years If all above answers are "NO", may proceed with cephalosporin use.    Coreg [Carvedilol] Rash   Amlodipine     Pt states it made her feel "awful and weird."   Metoprolol Other (See Comments)    " feel weird"   Betadine [Povidone Iodine] Rash   Hydrocodone-Acetaminophen Rash    ras   Lisinopril     Fatigue    Tramadol Itching and Rash    Current Outpatient Medications on File Prior to Visit  Medication Sig Dispense Refill   Ascorbic Acid (VITAMIN C) 1000 MG tablet Take 1,000 mg by mouth daily.     aspirin 81 MG tablet Take 1 tablet (81 mg total) by mouth daily.     atenolol (TENORMIN) 25 MG tablet TAKE 1 TABLET DAILY 90 tablet 3   b complex vitamins tablet Take 1 tablet by mouth daily.     buPROPion (WELLBUTRIN XL) 150 MG 24 hr tablet Take 1 tablet (150 mg total) by mouth daily. 90 tablet 0   calcium carbonate (TUMS - DOSED IN MG ELEMENTAL CALCIUM) 500 MG chewable  tablet Chew 1 tablet by mouth daily as needed for indigestion or heartburn.      Cholecalciferol (VITAMIN D) 2000 UNITS CAPS Take by mouth.     fluocinonide ointment (LIDEX) 3.38 % Apply 1 application topically daily as needed (itching). 60 g 1   ibuprofen (ADVIL) 200 MG tablet Take 400 mg by mouth in the morning and at bedtime.     levothyroxine (SYNTHROID) 112 MCG tablet Take 1 tablet (112 mcg total) by mouth daily. 90 tablet 3   losartan (COZAAR) 100 MG tablet TAKE 1 TABLET DAILY 90 tablet 3   atorvastatin (LIPITOR) 40 MG tablet Take 1 tablet (40 mg total) by mouth daily. 90 tablet 3   No current facility-administered medications on file prior to visit.    BP (!) 126/90 (BP Location: Left Arm, Patient Position: Sitting, Cuff Size: Large)   Pulse 60   Temp (!) 96.6 F (35.9 C) (Oral)   Ht '5\' 1"'$  (1.549 m)   Wt 197 lb 9.6 oz (89.6 kg)   SpO2 99%   BMI 37.34 kg/m       Objective:   Physical Exam Vitals and nursing note reviewed.  Constitutional:      General: She is not in acute distress.    Appearance: Normal appearance. She is well-developed. She is not ill-appearing.  HENT:     Head: Normocephalic and atraumatic.     Right Ear: Tympanic membrane, ear canal and external ear normal. There is no impacted cerumen.     Left Ear: Tympanic membrane, ear canal and external ear normal. There is no impacted cerumen.     Nose: Nose normal. No congestion or rhinorrhea.     Mouth/Throat:     Mouth: Mucous membranes are moist.     Pharynx: Oropharynx is clear. No oropharyngeal exudate or posterior oropharyngeal erythema.  Eyes:     General:        Right eye: No discharge.        Left eye: No  discharge.     Extraocular Movements: Extraocular movements intact.     Conjunctiva/sclera: Conjunctivae normal.     Pupils: Pupils are equal, round, and reactive to light.  Neck:     Thyroid: No thyromegaly.     Vascular: No carotid bruit.     Trachea: No tracheal deviation.  Cardiovascular:      Rate and Rhythm: Normal rate and regular rhythm.     Pulses: Normal pulses.     Heart sounds: Normal heart sounds. No murmur heard.    No friction rub. No gallop.  Pulmonary:     Effort: Pulmonary effort is normal. No respiratory distress.     Breath sounds: Normal breath sounds. No stridor. No wheezing, rhonchi or rales.  Chest:     Chest wall: No tenderness.  Abdominal:     General: Abdomen is flat. Bowel sounds are normal. There is no distension.     Palpations: Abdomen is soft. There is no mass.     Tenderness: There is no abdominal tenderness. There is no right CVA tenderness, left CVA tenderness, guarding or rebound.     Hernia: No hernia is present.  Musculoskeletal:        General: No swelling, tenderness, deformity or signs of injury. Normal range of motion.     Cervical back: Normal range of motion and neck supple.     Right lower leg: No edema.     Left lower leg: No edema.  Lymphadenopathy:     Cervical: No cervical adenopathy.  Skin:    General: Skin is warm and dry.     Coloration: Skin is not jaundiced or pale.     Findings: No bruising, erythema, lesion or rash.  Neurological:     General: No focal deficit present.     Mental Status: She is alert and oriented to person, place, and time.     Cranial Nerves: No cranial nerve deficit.     Sensory: No sensory deficit.     Motor: No weakness.     Coordination: Coordination normal.     Gait: Gait normal.     Deep Tendon Reflexes: Reflexes normal.  Psychiatric:        Mood and Affect: Mood normal.        Behavior: Behavior normal.        Thought Content: Thought content normal.        Judgment: Judgment normal.       Assessment & Plan:  1. Depression, major, single episode, mild ([place]) - Resolved. Continue with Wellbutrin '150mg'$  XR - Follow up in one year or sooner if needed  2. Essential hypertension - Well controlled. No change in medications - CBC with Differential/Platelet; Future - Comprehensive  metabolic panel; Future - Lipid panel; Future - TSH; Future - TSH - Lipid panel - Comprehensive metabolic panel - CBC with Differential/Platelet  3. Mixed hyperlipidemia - Consider increase in statin  - CBC with Differential/Platelet; Future - Comprehensive metabolic panel; Future - Lipid panel; Future - TSH; Future - TSH - Lipid panel - Comprehensive metabolic panel - CBC with Differential/Platelet  4. Hypothyroidism, unspecified type - Consider dose change of synthroid  - CBC with Differential/Platelet; Future - Comprehensive metabolic panel; Future - Lipid panel; Future - TSH; Future - TSH - Lipid panel - Comprehensive metabolic panel - CBC with Differential/Platelet  5. Stenosis of left carotid artery - Follow up with Cardiology as directed - CBC with Differential/Platelet; Future - Comprehensive metabolic panel; Future - Lipid panel;  Future - TSH; Future - TSH - Lipid panel - Comprehensive metabolic panel - CBC with Differential/Platelet  6. S/P CABG x 3 - Follow up with Cardiology as directed - CBC with Differential/Platelet; Future - Comprehensive metabolic panel; Future - Lipid panel; Future - TSH; Future - TSH - Lipid panel - Comprehensive metabolic panel - CBC with Differential/Platelet

## 2022-04-29 ENCOUNTER — Encounter: Payer: Self-pay | Admitting: Adult Health

## 2022-05-06 ENCOUNTER — Other Ambulatory Visit: Payer: Self-pay | Admitting: Adult Health

## 2022-05-06 DIAGNOSIS — R2681 Unsteadiness on feet: Secondary | ICD-10-CM

## 2022-05-10 ENCOUNTER — Other Ambulatory Visit: Payer: Self-pay | Admitting: Cardiovascular Disease

## 2022-05-11 ENCOUNTER — Other Ambulatory Visit: Payer: Self-pay

## 2022-05-11 ENCOUNTER — Ambulatory Visit: Payer: Medicare Other | Attending: Adult Health | Admitting: Physical Therapy

## 2022-05-11 ENCOUNTER — Encounter: Payer: Self-pay | Admitting: Physical Therapy

## 2022-05-11 DIAGNOSIS — M6281 Muscle weakness (generalized): Secondary | ICD-10-CM | POA: Diagnosis not present

## 2022-05-11 DIAGNOSIS — R2681 Unsteadiness on feet: Secondary | ICD-10-CM | POA: Insufficient documentation

## 2022-05-11 NOTE — Therapy (Addendum)
OUTPATIENT PHYSICAL THERAPY NEURO EVALUATION   Patient Name: Hannah Morrow MRN: [id number] DOB:12-15-1940, 81 y.o., female Today's Date: 05/17/2022   PCP: [name], NP REFERRING PROVIDER: [name], NP   PT End of Session - 05/17/22 1928     Visit Number 1    Date for PT Re-Evaluation 07/06/22    Authorization Type Medicare A and B    Authorization Time Period 05/11/22 to 07/06/22    Progress Note Due on Visit 10    PT Start Time 8527    PT Stop Time 7824    PT Time Calculation (min) 41 min    Activity Tolerance Patient tolerated treatment well    Behavior During Therapy WFL for tasks assessed/performed             Past Medical History:  Diagnosis Date   Allergy    no meds   ANXIETY    Cancer ([place])    cervical cancer - hysterectomy   GERD    Hyperlipidemia    Hypertension    Hypothyroid    Plantar fasciitis    right   RESTLESS LEGS SYNDROME    Past Surgical History:  Procedure Laterality Date   ABDOMINAL HYSTERECTOMY     BREAST SURGERY     Bilateral breast reduction   COLOSTOMY  04-22-2010   [facility]   cone bx     cervix   CORONARY ARTERY BYPASS GRAFT N/A 05/28/2019   Procedure: CORONARY ARTERY BYPASS GRAFTING (CABG) using the LIMA to LAD; Endoscopic right saphenous vein harvesting to the Ramus and Diag1.;  Surgeon: Grace Isaac, MD;  Location: [place];  Service: Open Heart Surgery;  Laterality: N/A;   LEFT HEART CATH AND CORONARY ANGIOGRAPHY N/A 05/25/2019   Procedure: LEFT HEART CATH AND CORONARY ANGIOGRAPHY;  Surgeon: Leonie Man, MD;  Location: [facility];  Service: Cardiovascular;  Laterality: N/A;   REDUCTION MAMMAPLASTY Bilateral    TEE WITHOUT CARDIOVERSION N/A 05/28/2019   Procedure: TRANSESOPHAGEAL ECHOCARDIOGRAM (TEE);  Surgeon: Grace Isaac, MD;  Location: [place];  Service: Open Heart Surgery;  Laterality: N/A;   WISDOM TOOTH EXTRACTION     Patient Active Problem List   Diagnosis Date Noted   CRI  (chronic renal insufficiency), stage 3 (moderate) ([place]) 10/02/2019   Carotid artery disease ([place]) 06/13/2019   S/P CABG x 3 05/29/2019   Progressive angina ([place]) 05/25/2019   Abnormal EKG 05/23/2019   BACK PAIN 08/26/2010   Vitamin D deficiency 09/04/2009   GERD 09/04/2009   FATIGUE 08/29/2008   RESTLESS LEGS SYNDROME 09/28/2007   INSOMNIA 09/28/2007   PALPITATIONS 09/28/2007   Hypothyroidism 08/10/2007   Dyslipidemia, goal LDL below 70 08/10/2007   ANXIETY 08/10/2007   Essential hypertension 08/10/2007   ASTHMA 08/10/2007    ONSET DATE: several months ago  REFERRING DIAG: gait instability   THERAPY DIAG:  Unsteadiness on feet - Plan: PT plan of care cert/re-cert  Muscle weakness (generalized) - Plan: PT plan of care cert/re-cert  Rationale for Evaluation and Treatment: Rehabilitation  SUBJECTIVE:  SUBJECTIVE STATEMENT: Pt states that she has been more unsteady with her walking and other mobility over the past several months. She mostly has trouble walking on uneven surfaces and when turning/changing direction. Pt lost her husband about a year ago and had lost the motivation to do much. She would walk her dog atleast 1 mile a day, but this has recently become shorter (half a mile) due to her fatigue and her low back pain. She would like to get back to walking her usual route without as much difficulty.    PERTINENT HISTORY: back pain  PAIN:  Are you having pain? No  PRECAUTIONS: None  WEIGHT BEARING RESTRICTIONS: No  FALLS: Has patient fallen in last 6 months? No has had a couple of close falls   LIVING ENVIRONMENT: Lives with: lives alone Lives in: House/apartment Stairs: No 1 step out back Has following equipment at home: None  PLOF: Independent  PATIENT GOALS: improve her  stamina/endurance  OBJECTIVE:   DIAGNOSTIC FINDINGS:   COGNITION: Overall cognitive status: Within functional limits for tasks assessed   SENSATION: WFL   MUSCLE LENGTH: Hamstrings:  Thomas test: Right 100 deg; Left 100 deg    POSTURE: No Significant postural limitations  LOWER EXTREMITY ROM:     AROM  Right Eval Left Eval  Hip flexion    Hip extension    Hip abduction    Hip adduction    Hip internal rotation    Hip external rotation    Knee flexion    Knee extension    Ankle dorsiflexion    Ankle plantarflexion    Ankle inversion    Ankle eversion     (Blank rows = not tested)  LOWER EXTREMITY MMT:    MMT Right Eval Left Eval  Hip flexion 4 4  Hip extension 3 3  Hip abduction 3 3  Hip adduction    Hip internal rotation    Hip external rotation    Knee flexion 4 4  Knee extension    Ankle dorsiflexion    Ankle plantarflexion Heel raise 10 Heel raise  10  Ankle inversion    Ankle eversion    (Blank rows = not tested)  BED MOBILITY:  Modified independent with all bed mobility, valsalva maneuver noted   CURB:  Level of Assistance: Min A Assistive device utilized: None Curb Comments: PT providing HHA when stepping down from the curb   GAIT: Gait pattern: decreased step length- Right and decreased step length- Left Distance walked: indoor and outdoor surfaces Assistive device utilized: None Level of assistance: Modified independence Comments: pt taking slow steps when turning indoors/outdoors. Pt watching the ground while ambulating outdoors  FUNCTIONAL TESTS:  5 times sit to stand: 10 Timed up and go (TUG): deferred Berg Balance Scale: 50/56-low risk 3MWT: long hallway x4.5 reps down/back  PATIENT SURVEYS:    TODAY'S TREATMENT:  DATE: 05/11/22 discussed getting back into a walking program-starting with 20 min a  day    PATIENT EDUCATION: Education details: eval findings/POC Person educated: Patient Education method: Explanation Education comprehension: verbalized understanding  HOME EXERCISE PROGRAM: Home walking program 20 min  GOALS: Goals reviewed with patient? Yes  SHORT TERM GOALS: Target date: 05/31/2022  Pt will be completing her walking program for atleast 5 days a week. Baseline: Goal status: INITIAL  2.  Pt will be independent with her initial HEP. Baseline:  Goal status: INITIAL  LONG TERM GOALS: Target date: 07/12/2022  Pt will have atleast 4/5 MMT strength in B LEs to improve her functional power. Baseline:  Goal status: INITIAL  2.  Pt will report being able to ambulate atleast 68mn for 5 days out of the week. Baseline:  Goal status: INITIAL  3.  Pt will be able to navigate outdoor curbs without the need for UE assist x3 trials. Baseline:  Goal status: INITIAL  4.  Pt's PASS score will fall within low falls risk category to improve her confidence and safety with daily activity.  Baseline:  Goal status: INITIAL    ASSESSMENT:  CLINICAL IMPRESSION: Patient is a 81y.o. F who was seen today for physical therapy evaluation and treatment for unsteadiness and declining LE strength. Pt lost her husband and became inactive. She was walking up to a mile almost daily, but has found this to be more difficult. She has BLE weakness and unsteadiness with ambulating outdoors, especially over curbs. She scored in a low falls risk category on the BERG balance test, and would benefit from further balance assessment at her next session.  OBJECTIVE IMPAIRMENTS: decreased activity tolerance, decreased balance, decreased endurance, decreased mobility, difficulty walking, decreased strength, impaired flexibility, and obesity.   ACTIVITY LIMITATIONS: squatting, bed mobility, and locomotion level  PARTICIPATION LIMITATIONS: laundry, shopping, community activity, and yard  work  PERSONAL FACTORS: Age, Fitness, Past/current experiences, Social background, and 1 comorbidity: back pain  are also affecting patient's functional outcome.   REHAB POTENTIAL: Good  CLINICAL DECISION MAKING: Stable/uncomplicated  EVALUATION COMPLEXITY: Low  PLAN:  PT FREQUENCY: 1x/week  PT DURATION: 8 weeks  PLANNED INTERVENTIONS: Therapeutic exercises, Therapeutic activity, Neuromuscular re-education, Balance training, Gait training, Patient/Family education, Self Care, Joint mobilization, Manual therapy, and Re-evaluation  PLAN FOR NEXT SESSION: PASS or other balance test and set goal; gastroc/hip strengthening; balance progression    7:28 PM,05/17/22 SSherol DadePT, DPT [place]  Addendum to include end of session info  7:29 PM,05/17/22 SSherol DadePT, DF[place]

## 2022-05-17 NOTE — Therapy (Signed)
OUTPATIENT PHYSICAL THERAPY TREATMENT NOTE   Patient Name: Hannah Morrow MRN: [id number] DOB:01-19-1941, 81 y.o., female Today's Date: 05/18/2022  PCP: [name], NP REFERRING PROVIDER: [name], NP  END OF SESSION:   PT End of Session - 05/18/22 1402     Visit Number 2    Date for PT Re-Evaluation 07/06/22    Authorization Type Medicare A and B    Authorization Time Period 05/11/22 to 07/06/22    Progress Note Due on Visit 10    PT Start Time 1400    PT Stop Time 4920    PT Time Calculation (min) 45 min    Activity Tolerance Patient tolerated treatment well    Behavior During Therapy [facility] for tasks assessed/performed                   Patient Name: Hannah Morrow MRN: [id number] DOB:12/25/1940, 81 y.o., female Today's Date: 05/18/2022   PCP: [name], NP REFERRING PROVIDER: [name], NP   PT End of Session - 05/18/22 1402     Visit Number 2    Date for PT Re-Evaluation 07/06/22    Authorization Type Medicare A and B    Authorization Time Period 05/11/22 to 07/06/22    Progress Note Due on Visit 10    PT Start Time 1400    PT Stop Time 5883    PT Time Calculation (min) 45 min    Activity Tolerance Patient tolerated treatment well    Behavior During Therapy WFL for tasks assessed/performed              Past Medical History:  Diagnosis Date   Allergy    no meds   ANXIETY    Cancer ([place])    cervical cancer - hysterectomy   GERD    Hyperlipidemia    Hypertension    Hypothyroid    Plantar fasciitis    right   RESTLESS LEGS SYNDROME    Past Surgical History:  Procedure Laterality Date   ABDOMINAL HYSTERECTOMY     BREAST SURGERY     Bilateral breast reduction   COLOSTOMY  04-22-2010   [facility]   cone bx     cervix   CORONARY ARTERY BYPASS GRAFT N/A 05/28/2019   Procedure: CORONARY ARTERY BYPASS GRAFTING (CABG) using the LIMA to LAD; Endoscopic right saphenous vein harvesting to the Ramus and Diag1.;   Surgeon: Grace Isaac, MD;  Location: [place];  Service: Open Heart Surgery;  Laterality: N/A;   LEFT HEART CATH AND CORONARY ANGIOGRAPHY N/A 05/25/2019   Procedure: LEFT HEART CATH AND CORONARY ANGIOGRAPHY;  Surgeon: Leonie Man, MD;  Location: [place] [facility];  Service: Cardiovascular;  Laterality: N/A;   REDUCTION MAMMAPLASTY Bilateral    TEE WITHOUT CARDIOVERSION N/A 05/28/2019   Procedure: TRANSESOPHAGEAL ECHOCARDIOGRAM (TEE);  Surgeon: Grace Isaac, MD;  Location: [place];  Service: Open Heart Surgery;  Laterality: N/A;   WISDOM TOOTH EXTRACTION     Patient Active Problem List   Diagnosis Date Noted   CRI (chronic renal insufficiency), stage 3 (moderate) ([place]) 10/02/2019   Carotid artery disease ([place]) 06/13/2019   S/P CABG x 3 05/29/2019   Progressive angina ([place]) 05/25/2019   Abnormal EKG 05/23/2019   BACK PAIN 08/26/2010   Vitamin D deficiency 09/04/2009   GERD 09/04/2009   FATIGUE 08/29/2008   RESTLESS LEGS SYNDROME 09/28/2007   INSOMNIA 09/28/2007   PALPITATIONS 09/28/2007   Hypothyroidism 08/10/2007   Dyslipidemia, goal LDL below  70 08/10/2007   ANXIETY 08/10/2007   Essential hypertension 08/10/2007   ASTHMA 08/10/2007    ONSET DATE: several months ago  REFERRING DIAG: gait instability   THERAPY DIAG:  Unsteadiness on feet  Muscle weakness (generalized)  Rationale for Evaluation and Treatment: Rehabilitation  SUBJECTIVE:                                                                                                                                                                                             SUBJECTIVE STATEMENT: Pt states that things are going well. She has been working on her walking, and she was able to get up to 20 minutes.   PERTINENT HISTORY: back pain  PAIN:  Are you having pain? No  PRECAUTIONS: None  WEIGHT BEARING RESTRICTIONS: No  FALLS: Has patient fallen in last 6 months? No has had a couple of close falls    LIVING ENVIRONMENT: Lives with: lives alone Lives in: House/apartment Stairs: No 1 step out back Has following equipment at home: None  PLOF: Independent  PATIENT GOALS: improve her stamina/endurance  OBJECTIVE:   DIAGNOSTIC FINDINGS:   COGNITION: Overall cognitive status: Within functional limits for tasks assessed   SENSATION: WFL   MUSCLE LENGTH: Hamstrings:  Thomas test: Right 100 deg; Left 100 deg    POSTURE: No Significant postural limitations  LOWER EXTREMITY ROM:     AROM  Right Eval Left Eval  Hip flexion    Hip extension    Hip abduction    Hip adduction    Hip internal rotation    Hip external rotation    Knee flexion    Knee extension    Ankle dorsiflexion    Ankle plantarflexion    Ankle inversion    Ankle eversion     (Blank rows = not tested)  LOWER EXTREMITY MMT:    MMT Right Eval Left Eval  Hip flexion 4 4  Hip extension 3 3  Hip abduction 3 3  Hip adduction    Hip internal rotation    Hip external rotation    Knee flexion 4 4  Knee extension    Ankle dorsiflexion    Ankle plantarflexion Heel raise 10 Heel raise  10  Ankle inversion    Ankle eversion    (Blank rows = not tested)  BED MOBILITY:  Modified independent with all bed mobility, valsalva maneuver noted   CURB:  Level of Assistance: Min A Assistive device utilized: None Curb Comments: PT providing HHA when stepping down from the curb   GAIT: Gait pattern: decreased step length- Right and decreased step length- Left Distance walked:  indoor and outdoor surfaces Assistive device utilized: None Level of assistance: Modified independence Comments: pt taking slow steps when turning indoors/outdoors. Pt watching the ground while ambulating outdoors  FUNCTIONAL TESTS:  5 times sit to stand: 10 Timed up and go (TUG): deferred Berg Balance Scale: 50/56-low risk 05/18/22:    FGA: 20/30 3MWT: long hallway x4.5 reps down/back  PATIENT SURVEYS:    TODAY'S  TREATMENT:                                                                                                                              DATE:  05/18/22: FGA  BLE bridge 2x10 yellow TB around knees  Sidelying clam, yellow TB x10 each side Shoulder extension yellow x15, green x10 Standing quad stretch with LE in chair, 2x20 sec  SLS with LE tap 5x5 sec abd and ext Balance: tandem EO/EC x20 sec   05/11/22 discussed getting back into a walking program-starting with 20 min a day    PATIENT EDUCATION: Education details: HEP implemented; results of FGA Person educated: Patient Education method: Explanation Education comprehension: verbalized understanding  HOME EXERCISE PROGRAM: Access Code: [email or web address] URL: [email or web address] Date: 05/18/2022 Prepared by: [facility]  Exercises - Supine Bridge with Resistance Band  - 1 x daily - 7 x weekly - 2 sets - 10 reps - Clamshell with Resistance  - 1 x daily - 7 x weekly - 1 sets - 10 reps - Heel Raises with Counter Support with Brace On  - 1 x daily - 7 x weekly - 2 sets - 20 reps  GOALS: Goals reviewed with patient? Yes  SHORT TERM GOALS: Target date: 06/01/2022  Pt will be completing her walking program for atleast 5 days a week. Baseline: Goal status: INITIAL  2.  Pt will be independent with her initial HEP. Baseline:  Goal status: INITIAL  LONG TERM GOALS: Target date: 07/13/2022  Pt will have atleast 4/5 MMT strength in B LEs to improve her functional power. Baseline:  Goal status: INITIAL  2.  Pt will report being able to ambulate atleast 31mn for 5 days out of the week. Baseline:  Goal status: INITIAL  3.  Pt will be able to navigate outdoor curbs without the need for UE assist x3 trials. Baseline:  Goal status: INITIAL  4.  Pt's FGA score will fall within low falls risk category to improve her confidence and safety with daily activity.  Baseline:  20/30 Goal status: INITIAL    ASSESSMENT:  CLINICAL IMPRESSION: Pt has been working on her walking atleast 20 min a day over the past week. PT performed the FGA with pt scoring 20/30. This places her in a higher fall risk category and she had the most difficulty with head turns and stairs. Pt was able to perform gentle LE strengthening and demonstrated understanding of her HEP which was implemented this session. Pt's low back pain was monitored during  the session and there was no pain reported following introduction of LE exercises.   OBJECTIVE IMPAIRMENTS: decreased activity tolerance, decreased balance, decreased endurance, decreased mobility, difficulty walking, decreased strength, impaired flexibility, and obesity.   ACTIVITY LIMITATIONS: squatting, bed mobility, and locomotion level  PARTICIPATION LIMITATIONS: laundry, shopping, community activity, and yard work  PERSONAL FACTORS: Age, Fitness, Past/current experiences, Social background, and 1 comorbidity: back pain  are also affecting patient's functional outcome.   REHAB POTENTIAL: Good  CLINICAL DECISION MAKING: Stable/uncomplicated  EVALUATION COMPLEXITY: Low  PLAN:  PT FREQUENCY: 1x/week  PT DURATION: 8 weeks  PLANNED INTERVENTIONS: Therapeutic exercises, Therapeutic activity, Neuromuscular re-education, Balance training, Gait training, Patient/Family education, Self Care, Joint mobilization, Manual therapy, and Re-evaluation  PLAN FOR NEXT SESSION: PASS or other balance test and set goal; gastroc/hip strengthening; balance progression    2:54 PM,05/18/22 Sherol Dade PT, DPT [place] [facility] [place]

## 2022-05-17 NOTE — Therapy (Deleted)
OUTPATIENT PHYSICAL THERAPY NEURO EVALUATION   Patient Name: Hannah Morrow MRN: [id number] DOB:Oct 09, 1940, 81 y.o., female Today's Date: 05/17/2022   PCP: [name], NP REFERRING PROVIDER: [name], NP    Past Medical History:  Diagnosis Date   Allergy    no meds   ANXIETY    Cancer ([place])    cervical cancer - hysterectomy   GERD    Hyperlipidemia    Hypertension    Hypothyroid    Plantar fasciitis    right   RESTLESS LEGS SYNDROME    Past Surgical History:  Procedure Laterality Date   ABDOMINAL HYSTERECTOMY     BREAST SURGERY     Bilateral breast reduction   COLOSTOMY  04-22-2010   [name]   cone bx     cervix   CORONARY ARTERY BYPASS GRAFT N/A 05/28/2019   Procedure: CORONARY ARTERY BYPASS GRAFTING (CABG) using the LIMA to LAD; Endoscopic right saphenous vein harvesting to the Ramus and Diag1.;  Surgeon: Grace Isaac, MD;  Location: [facility];  Service: Open Heart Surgery;  Laterality: N/A;   LEFT HEART CATH AND CORONARY ANGIOGRAPHY N/A 05/25/2019   Procedure: LEFT HEART CATH AND CORONARY ANGIOGRAPHY;  Surgeon: Leonie Man, MD;  Location: [facility];  Service: Cardiovascular;  Laterality: N/A;   REDUCTION MAMMAPLASTY Bilateral    TEE WITHOUT CARDIOVERSION N/A 05/28/2019   Procedure: TRANSESOPHAGEAL ECHOCARDIOGRAM (TEE);  Surgeon: Grace Isaac, MD;  Location: [facility];  Service: Open Heart Surgery;  Laterality: N/A;   WISDOM TOOTH EXTRACTION     Patient Active Problem List   Diagnosis Date Noted   CRI (chronic renal insufficiency), stage 3 (moderate) ([place]) 10/02/2019   Carotid artery disease ([place]) 06/13/2019   S/P CABG x 3 05/29/2019   Progressive angina ([place]) 05/25/2019   Abnormal EKG 05/23/2019   BACK PAIN 08/26/2010   Vitamin D deficiency 09/04/2009   GERD 09/04/2009   FATIGUE 08/29/2008   RESTLESS LEGS SYNDROME 09/28/2007   INSOMNIA 09/28/2007   PALPITATIONS 09/28/2007   Hypothyroidism 08/10/2007   Dyslipidemia,  goal LDL below 70 08/10/2007   ANXIETY 08/10/2007   Essential hypertension 08/10/2007   ASTHMA 08/10/2007    ONSET DATE: several months ago  REFERRING DIAG: gait instability   THERAPY DIAG:  No diagnosis found.  Rationale for Evaluation and Treatment: Rehabilitation  SUBJECTIVE:                                                                                                                                                                                             SUBJECTIVE STATEMENT: Pt states that she has been more  unsteady with her walking and other mobility over the past several months. She mostly has trouble walking on uneven surfaces and when turning/changing direction. Pt lost her husband about a year ago and had lost the motivation to do much. She would walk her dog atleast 1 mile a day, but this has recently become shorter (half a mile) due to her fatigue and her low back pain. She would like to get back to walking her usual route without as much difficulty.    PERTINENT HISTORY: back pain  PAIN:  Are you having pain? No  PRECAUTIONS: None  WEIGHT BEARING RESTRICTIONS: No  FALLS: Has patient fallen in last 6 months? No has had a couple of close falls   LIVING ENVIRONMENT: Lives with: lives alone Lives in: House/apartment Stairs: No 1 step out back Has following equipment at home: None  PLOF: Independent  PATIENT GOALS: improve her stamina/endurance  OBJECTIVE:   DIAGNOSTIC FINDINGS:   COGNITION: Overall cognitive status: Within functional limits for tasks assessed   SENSATION: WFL   MUSCLE LENGTH: Hamstrings:  Thomas test: Right 100 deg; Left 100 deg    POSTURE: No Significant postural limitations  LOWER EXTREMITY ROM:     AROM  Right Eval Left Eval  Hip flexion    Hip extension    Hip abduction    Hip adduction    Hip internal rotation    Hip external rotation    Knee flexion    Knee extension    Ankle dorsiflexion    Ankle  plantarflexion    Ankle inversion    Ankle eversion     (Blank rows = not tested)  LOWER EXTREMITY MMT:    MMT Right Eval Left Eval  Hip flexion 4 4  Hip extension 3 3  Hip abduction 3 3  Hip adduction    Hip internal rotation    Hip external rotation    Knee flexion 4 4  Knee extension    Ankle dorsiflexion    Ankle plantarflexion Heel raise 10 Heel raise  10  Ankle inversion    Ankle eversion    (Blank rows = not tested)  BED MOBILITY:  Modified independent with all bed mobility, valsalva maneuver noted   CURB:  Level of Assistance: Min A Assistive device utilized: None Curb Comments: PT providing HHA when stepping down from the curb   GAIT: Gait pattern: decreased step length- Right and decreased step length- Left Distance walked: indoor and outdoor surfaces Assistive device utilized: None Level of assistance: Modified independence Comments: pt taking slow steps when turning indoors/outdoors. Pt watching the ground while ambulating outdoors  FUNCTIONAL TESTS:  5 times sit to stand: 10 Timed up and go (TUG): deferred Berg Balance Scale: 50/56-low risk 3MWT: long hallway x4.5 reps down/back  PATIENT SURVEYS:    TODAY'S TREATMENT:  DATE: 05/11/22 discussed getting back into a walking program-starting with 20 min a day    PATIENT EDUCATION: Education details: eval findings/POC Person educated: Patient Education method: Explanation Education comprehension: verbalized understanding  HOME EXERCISE PROGRAM: Home walking program 20 min  GOALS: Goals reviewed with patient? Yes  SHORT TERM GOALS: Target date: 05/31/2022  Pt will be completing her walking program for atleast 5 days a week. Baseline: Goal status: INITIAL  2.  Pt will be independent with her initial HEP. Baseline:  Goal status: INITIAL  LONG TERM GOALS: Target  date: 07/12/2022  Pt will have atleast 4/5 MMT strength in B LEs to improve her functional power. Baseline:  Goal status: INITIAL  2.  Pt will report being able to ambulate atleast 87mn for 5 days out of the week. Baseline:  Goal status: INITIAL  3.  Pt will be able to navigate outdoor curbs without the need for UE assist x3 trials. Baseline:  Goal status: INITIAL  4.  Pt's PASS score will fall within low falls risk category to improve her confidence and safety with daily activity.  Baseline:  Goal status: INITIAL    ASSESSMENT:  CLINICAL IMPRESSION: Patient is a 81y.o. F who was seen today for physical therapy evaluation and treatment for unsteadiness and declining LE strength. Pt lost her husband and became inactive. She was walking up to a mile almost daily, but has found this to be more difficult. She has BLE weakness and unsteadiness with ambulating outdoors, especially over curbs. She scored in a low falls risk category on the BERG balance test, and would benefit from further balance assessment at her next session.  OBJECTIVE IMPAIRMENTS: decreased activity tolerance, decreased balance, decreased endurance, decreased mobility, difficulty walking, decreased strength, impaired flexibility, and obesity.   ACTIVITY LIMITATIONS: squatting, bed mobility, and locomotion level  PARTICIPATION LIMITATIONS: laundry, shopping, community activity, and yard work  PERSONAL FACTORS: Age, Fitness, Past/current experiences, Social background, and 1 comorbidity: back pain  are also affecting patient's functional outcome.   REHAB POTENTIAL: Good  CLINICAL DECISION MAKING: Stable/uncomplicated  EVALUATION COMPLEXITY: Low  PLAN:  PT FREQUENCY: 1x/week  PT DURATION: 8 weeks  PLANNED INTERVENTIONS: Therapeutic exercises, Therapeutic activity, Neuromuscular re-education, Balance training, Gait training, Patient/Family education, Self Care, Joint mobilization, Manual therapy, and  Re-evaluation  PLAN FOR NEXT SESSION: PASS or other balance test and set goal; gastroc/hip strengthening; balance progression    7:26 PM,05/17/22 SSherol DadePT, DPT [place]

## 2022-05-18 ENCOUNTER — Encounter: Payer: Self-pay | Admitting: Physical Therapy

## 2022-05-18 ENCOUNTER — Ambulatory Visit: Payer: Medicare Other | Admitting: Physical Therapy

## 2022-05-18 DIAGNOSIS — M6281 Muscle weakness (generalized): Secondary | ICD-10-CM | POA: Diagnosis not present

## 2022-05-18 DIAGNOSIS — R2681 Unsteadiness on feet: Secondary | ICD-10-CM

## 2022-05-25 ENCOUNTER — Encounter: Payer: Self-pay | Admitting: Physical Therapy

## 2022-05-25 ENCOUNTER — Ambulatory Visit: Payer: Medicare Other | Admitting: Physical Therapy

## 2022-05-25 DIAGNOSIS — M6281 Muscle weakness (generalized): Secondary | ICD-10-CM

## 2022-05-25 DIAGNOSIS — R2681 Unsteadiness on feet: Secondary | ICD-10-CM | POA: Diagnosis not present

## 2022-05-25 NOTE — Therapy (Signed)
OUTPATIENT PHYSICAL THERAPY TREATMENT NOTE   Patient Name: Hannah Morrow MRN: [id number] DOB:1940/11/03, 81 y.o., female Today's Date: 05/25/2022  PCP: [name], NP REFERRING PROVIDER: [name], NP  END OF SESSION:   Patient Name: Hannah Morrow MRN: [id number] DOB:[birth year], 81 y.o., female Today's Date: 05/25/2022   PCP: [name], NP REFERRING PROVIDER: [name], NP      Past Medical History:  Diagnosis Date   Allergy    no meds   ANXIETY    Cancer ([place])    cervical cancer - hysterectomy   GERD    Hyperlipidemia    Hypertension    Hypothyroid    Plantar fasciitis    right   RESTLESS LEGS SYNDROME    Past Surgical History:  Procedure Laterality Date   ABDOMINAL HYSTERECTOMY     BREAST SURGERY     Bilateral breast reduction   COLOSTOMY  04-22-2010   [name]   cone bx     cervix   CORONARY ARTERY BYPASS GRAFT N/A 05/28/2019   Procedure: CORONARY ARTERY BYPASS GRAFTING (CABG) using the LIMA to LAD; Endoscopic right saphenous vein harvesting to the Ramus and Diag1.;  Surgeon: Grace Isaac, MD;  Location: [facility];  Service: Open Heart Surgery;  Laterality: N/A;   LEFT HEART CATH AND CORONARY ANGIOGRAPHY N/A 05/25/2019   Procedure: LEFT HEART CATH AND CORONARY ANGIOGRAPHY;  Surgeon: Leonie Man, MD;  Location: [facility];  Service: Cardiovascular;  Laterality: N/A;   REDUCTION MAMMAPLASTY Bilateral    TEE WITHOUT CARDIOVERSION N/A 05/28/2019   Procedure: TRANSESOPHAGEAL ECHOCARDIOGRAM (TEE);  Surgeon: Grace Isaac, MD;  Location: [facility];  Service: Open Heart Surgery;  Laterality: N/A;   WISDOM TOOTH EXTRACTION     Patient Active Problem List   Diagnosis Date Noted   CRI (chronic renal insufficiency), stage 3 (moderate) ([place]) 10/02/2019   Carotid artery disease ([place]) 06/13/2019   S/P CABG x 3 05/29/2019   Progressive angina ([place]) 05/25/2019   Abnormal EKG 05/23/2019   BACK PAIN 08/26/2010    Vitamin D deficiency 09/04/2009   GERD 09/04/2009   FATIGUE 08/29/2008   RESTLESS LEGS SYNDROME 09/28/2007   INSOMNIA 09/28/2007   PALPITATIONS 09/28/2007   Hypothyroidism 08/10/2007   Dyslipidemia, goal LDL below 70 08/10/2007   ANXIETY 08/10/2007   Essential hypertension 08/10/2007   ASTHMA 08/10/2007    ONSET DATE: several months ago  REFERRING DIAG: gait instability   THERAPY DIAG:  No diagnosis found.  Rationale for Evaluation and Treatment: Rehabilitation  SUBJECTIVE:  SUBJECTIVE STATEMENT: Pt states that she is doing great. She no longer has a fear of falling. She feels confident when walking and is able to perform all her exercises.    PERTINENT HISTORY: back pain  PAIN:  Are you having pain? No  PRECAUTIONS: None  WEIGHT BEARING RESTRICTIONS: No  FALLS: Has patient fallen in last 6 months? No has had a couple of close falls   LIVING ENVIRONMENT: Lives with: lives alone Lives in: House/apartment Stairs: No 1 step out back Has following equipment at home: None  PLOF: Independent  PATIENT GOALS: improve her stamina/endurance  OBJECTIVE:   DIAGNOSTIC FINDINGS:   COGNITION: Overall cognitive status: Within functional limits for tasks assessed   SENSATION: WFL   MUSCLE LENGTH: Hamstrings:  Thomas test: Right 100 deg; Left 100 deg    POSTURE: No Significant postural limitations  LOWER EXTREMITY ROM:     AROM  Right Eval Left Eval  Hip flexion    Hip extension    Hip abduction    Hip adduction    Hip internal rotation    Hip external rotation    Knee flexion    Knee extension    Ankle dorsiflexion    Ankle plantarflexion    Ankle inversion    Ankle eversion     (Blank rows = not tested)  LOWER EXTREMITY MMT:    MMT Right Eval Left Eval  R/L 05/25/2022  Hip flexion 4 4 5/5  Hip extension 3 3 4+/4+  Hip abduction 3 3 4+/4+  Hip adduction     Hip internal rotation     Hip external rotation     Knee flexion _0 Knee extension     Ankle dorsiflexion     Ankle plantarflexion Heel raise 10 Heel raise  10 Heel raises 15   Ankle inversion     Ankle eversion     (Blank rows = not tested)  BED MOBILITY:  Modified independent with all bed mobility, valsalva maneuver noted   CURB:  Level of Assistance: Min A Assistive device utilized: None Curb Comments: PT providing HHA when stepping down from the curb   GAIT: Gait pattern: decreased step length- Right and decreased step length- Left Distance walked: indoor and outdoor surfaces Assistive device utilized: None Level of assistance: Modified independence Comments: pt taking slow steps when turning indoors/outdoors. Pt watching the ground while ambulating outdoors  FUNCTIONAL TESTS:  5 times sit to stand: 10 Timed up and go (TUG): deferred Berg Balance Scale: 50/56-low risk 05/18/22:    FGA: 20/30 3MWT: long hallway x4.5 reps down/back  5 times sit to stand: 12.54 3MWT: long hallway x4.5 reps down/back  PATIENT SURVEYS:   TODAY'S TREATMENT:                                                                                                                              DATE:  05/25/22: Reassessed MMT, gait, reviewed goals.  TODAY'S TREATMENT:                                                                                                                              DATE:  05/18/22: FGA  BLE bridge 2x10 yellow TB around knees  Sidelying clam, yellow TB x10 each side Shoulder extension yellow x15, green x10 Standing quad stretch with LE in chair, 2x20 sec  SLS with LE tap 5x5 sec abd and ext Balance: tandem EO/EC x20 sec   05/11/22 discussed getting back into a walking program-starting with 20 min a day    PATIENT EDUCATION: Education details: HEP  implemented; results of FGA Person educated: Patient Education method: Explanation Education comprehension: verbalized understanding  HOME EXERCISE PROGRAM: Access Code: [email or web address] URL: [email or web address] Date: 05/18/2022 Prepared by: [facility]  Exercises - Supine Bridge with Resistance Band  - 1 x daily - 7 x weekly - 2 sets - 10 reps - Clamshell with Resistance  - 1 x daily - 7 x weekly - 1 sets - 10 reps - Heel Raises with Counter Support with Brace On  - 1 x daily - 7 x weekly - 2 sets - 20 reps  GOALS: Goals reviewed with patient? Yes  SHORT TERM GOALS: Target date: 06/08/2022  Pt will be completing her walking program for atleast 5 days a week. Baseline: Goal status: MET   2.  Pt will be independent with her initial HEP. Baseline:  Goal status: MET   LONG TERM GOALS: Target date: 07/20/2022  Pt will have atleast 4/5 MMT strength in B LEs to improve her functional power. Baseline:  Goal status: MET   2.  Pt will report being able to ambulate atleast 29mn for 5 days out of the week. Baseline:  Goal status: MET   3.  Pt will be able to navigate outdoor curbs without the need for UE assist x3 trials. Baseline:  Goal status: MET   4.  Pt's FGA score will fall within low falls risk category to improve her confidence and safety with daily activity.  Baseline: 20/30 Goal status: MET     ASSESSMENT:  CLINICAL IMPRESSION: Pt reports to PT today with the request to be discharged. She states that she feels more confident with her balance and gait. Pt demonstrates improved balance and strength in bilat LE's. She continues to have decreased endurance with the 3MWT. Pt reports ability to negotiate a curb with no reported issues. She continues to utilize hand rails when ascending/ descending stairs, but is able to negotiate the stairs without use of UE's. Pt has bene discharged to a comprehensive HEP per pt  request.   OBJECTIVE IMPAIRMENTS: decreased activity tolerance, decreased balance, decreased endurance, decreased mobility, difficulty walking, decreased strength, impaired flexibility, and obesity.   ACTIVITY LIMITATIONS: squatting, bed mobility, and locomotion level  PARTICIPATION LIMITATIONS: laundry, shopping, community activity, and yard work  PERSONAL FACTORS: Age, Fitness, Past/current  experiences, Social background, and 1 comorbidity: back pain  are also affecting patient's functional outcome.   REHAB POTENTIAL: Good  CLINICAL DECISION MAKING: Stable/uncomplicated  EVALUATION COMPLEXITY: Low  PHYSICAL THERAPY DISCHARGE SUMMARY  Visits from Start of Care: 3  Current functional level related to goals / functional outcomes: Pt has improved strength in her Bilat LE's. She is now able to walk 30 minutes a day without complications. Pt reports increased confidence with ambulation and negotiating stairs.    Remaining deficits: Instability when descending stairs without use of handrails.    Education / Equipment: None    Patient agrees to discharge. Patient goals were met. Patient is being discharged due to the patient's request.  Rudi Heap PT, DPT 05/25/22  9:55 AM

## 2022-06-01 ENCOUNTER — Encounter: Payer: Medicare Other | Admitting: Physical Therapy

## 2022-06-09 ENCOUNTER — Encounter: Payer: Medicare Other | Admitting: Physical Therapy

## 2022-06-14 ENCOUNTER — Other Ambulatory Visit: Payer: Self-pay | Admitting: Adult Health

## 2022-06-15 ENCOUNTER — Encounter: Payer: Medicare Other | Admitting: Physical Therapy

## 2022-06-16 ENCOUNTER — Other Ambulatory Visit: Payer: Self-pay | Admitting: Adult Health

## 2022-06-16 DIAGNOSIS — F320 Major depressive disorder, single episode, mild: Secondary | ICD-10-CM

## 2022-06-22 ENCOUNTER — Encounter: Payer: Medicare Other | Admitting: Physical Therapy

## 2022-06-23 ENCOUNTER — Ambulatory Visit: Payer: Medicare Other | Attending: Cardiovascular Disease | Admitting: Cardiovascular Disease

## 2022-06-23 ENCOUNTER — Encounter: Payer: Self-pay | Admitting: Cardiovascular Disease

## 2022-06-23 VITALS — BP 140/90 | HR 68 | Ht 61.0 in | Wt 204.0 lb

## 2022-06-23 DIAGNOSIS — E785 Hyperlipidemia, unspecified: Secondary | ICD-10-CM

## 2022-06-23 DIAGNOSIS — I6522 Occlusion and stenosis of left carotid artery: Secondary | ICD-10-CM | POA: Diagnosis not present

## 2022-06-23 DIAGNOSIS — I10 Essential (primary) hypertension: Secondary | ICD-10-CM | POA: Diagnosis not present

## 2022-06-23 DIAGNOSIS — Z951 Presence of aortocoronary bypass graft: Secondary | ICD-10-CM | POA: Diagnosis not present

## 2022-06-23 NOTE — Assessment & Plan Note (Signed)
History of CAD status post cardiac catheterization performed by Dr. Ellyn Hack 05/25/2019 revealing severe two-vessel disease.  3 days later she underwent CABG x 3 by Dr. Servando Snare with a LIMA to her LAD, vein to ramus branch and diagonal branch.  She has been well since and denies chest pain or shortness of breath.

## 2022-06-23 NOTE — Patient Instructions (Signed)
Medication Instructions:  Your physician recommends that you continue on your current medications as directed. Please refer to the Current Medication list given to you today.  *If you need a refill on your cardiac medications before your next appointment, please call your pharmacy*   Follow-Up: At [facility], you and your health needs are our priority.  As part of our continuing mission to provide you with exceptional heart care, we have created designated Provider Care Teams.  These Care Teams include your primary Cardiologist (physician) and Advanced Practice Providers (APPs -  Physician Assistants and Nurse Practitioners) who all work together to provide you with the care you need, when you need it.  We recommend signing up for the patient portal called "MyChart".  Sign up information is provided on this After Visit Summary.  MyChart is used to connect with patients for Virtual Visits (Telemedicine).  Patients are able to view lab/test results, encounter notes, upcoming appointments, etc.  Non-urgent messages can be sent to your provider as well.   To learn more about what you can do with MyChart, go to [email or web address].    Your next appointment:   12 month(s)  The format for your next appointment:   In Person  Provider:   Jonathan Berry, MD   

## 2022-06-23 NOTE — Progress Notes (Signed)
06/23/2022 Hannah Morrow   [birth year]  [id number]  Primary Physician Nafziger, Tommi Rumps, NP Primary Cardiologist: Lorretta Harp MD Hannah Morrow, [name]  HPI:  Hannah Morrow is a 81 y.o.  moderately overweight married Caucasian female mother of 2 daughters, grandmother of 5 grandchildren and great grandmother of 71 great grandchildren referred by Hannah Peng NP for evaluation of chest pain.  Her husband Hannah Morrow was also a patient of mine. They were married for 42 years.  I last saw her in the office 03/11/2021. 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  Her risk factors include treated hypertension and hyperlipidemia.  There is no family history of heart disease.  She is never had a heart attack or stroke.  She is otherwise healthy.  She does have a history of GERD.  She had chest pain last week lasting 3 days rating to her neck back shoulders and throat relieved with eructation.  She has had no recurrent symptoms.   I had arranged for her to undergo undergo coronary CTA which was not scheduled scheduled until the beginning of December.  She came back in to see Hannah Morrow because of progressive angina on 05/23/2019 with anterior T wave inversion more pronounced than on the prior EKG.  She was admitted for cardiac cath performed by Dr. Ellyn Hack on 05/25/2019 revealing severe two-vessel disease requiring surgical revascularization which was performed 3 days later by Dr. Servando Snare.  She had LIMA to LAD, vein to ramus branch and diagonal branch.  She was discharged home on 28 November and has been recuperating at home with major complaints of fatigue.   Since I saw her a year ago she continues to do well.  She is a little unsteady on her feet and does not walk as far she has in the past.  She denies chest pain or shortness of breath.  Her husband Hannah Morrow , who is a patient of mine, unfortunately passed away 2 years ago.  They have been married for 42 years.   Current Meds   Medication Sig   Ascorbic Acid (VITAMIN C) 1000 MG tablet Take 1,000 mg by mouth daily.   aspirin 81 MG tablet Take 1 tablet (81 mg total) by mouth daily.   atenolol (TENORMIN) 25 MG tablet TAKE 1 TABLET DAILY   atorvastatin (LIPITOR) 40 MG tablet TAKE 1 TABLET DAILY   buPROPion (WELLBUTRIN XL) 150 MG 24 hr tablet TAKE 1 TABLET DAILY   calcium carbonate (TUMS - DOSED IN MG ELEMENTAL CALCIUM) 500 MG chewable tablet Chew 1 tablet by mouth daily as needed for indigestion or heartburn.    Cholecalciferol (VITAMIN D) 2000 UNITS CAPS Take by mouth.   fluocinonide ointment (LIDEX) 6.16 % Apply 1 application topically daily as needed (itching).   ibuprofen (ADVIL) 200 MG tablet Take 400 mg by mouth in the morning and at bedtime.   levothyroxine (SYNTHROID) 112 MCG tablet TAKE 1 TABLET DAILY   losartan (COZAAR) 100 MG tablet TAKE 1 TABLET DAILY     Allergies  Allergen Reactions   Penicillins Shortness Of Breath    red face Did it involve swelling of the face/tongue/throat, SOB, or low BP? Yes Did it involve sudden or severe rash/hives, skin peeling, or any reaction on the inside of your mouth or nose? Yes Did you need to seek medical attention at a hospital or doctor's office? Yes When did it last happen?      40+ years If all above answers  are "NO", may proceed with cephalosporin use.    Coreg [Carvedilol] Rash   Amlodipine     Pt states it made her feel "awful and weird."   Metoprolol Other (See Comments)    " feel weird"   Betadine [Povidone Iodine] Rash   Hydrocodone-Acetaminophen Rash    ras   Lisinopril     Fatigue    Tramadol Itching and Rash    Social History   Socioeconomic History   Marital status: Widowed    Spouse name: Not on file   Number of children: 2   Years of education: Not on file   Highest education level: Not on file  Occupational History   Not on file  Tobacco Use   Smoking status: Never   Smokeless tobacco: Never  Vaping Use   Vaping Use: Not on  file  Substance and Sexual Activity   Alcohol use: Yes    Alcohol/week: 5.0 standard drinks of alcohol    Types: 5 Glasses of wine per week    Comment: Occasional   Drug use: No   Sexual activity: Yes    Partners: Male    Birth control/protection: Surgical, Other-see comments    Comment: hysterectomy  Other Topics Concern   Not on file  Social History Narrative   Married for 57 years   Two daughters, five grand children and 5 great grand children. Daughter lives in [place]. Other daughter lives in [place].    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    Exercise Does not exercise. Was going to exercise class but has since quite.    Diet: Veggies, chicken, salmon. Very little red meat. Eats lots of eggs.    Exercise --- walking , gym 3 classes a week   Social Determinants of Health   Financial Resource Strain: Low Risk  (12/30/2021)   Overall Financial Resource Strain (CARDIA)    Difficulty of Paying Living Expenses: Not hard at all  Food Insecurity: No Food Insecurity (12/30/2021)   Hunger Vital Sign    Worried About Running Out of Food in the Last Year: Never true    Ran Out of Food in the Last Year: Never true  Transportation Needs: No Transportation Needs (12/30/2021)   PRAPARE - Hydrologist (Medical): No    Lack of Transportation (Non-Medical): No  Physical Activity: Insufficiently Active (12/30/2021)   Exercise Vital Sign    Days of Exercise per Week: 7 days    Minutes of Exercise per Session: 20 min  Stress: Stress Concern Present (12/30/2021)   [place]    Feeling of Stress : To some extent  Social Connections: Moderately Integrated (12/30/2021)   Social Connection and Isolation Panel [NHANES]    Frequency of Communication with Friends and Family: More than three times a week    Frequency of Social Gatherings with Friends and  Family: More than three times a week    Attends Religious Services: More than 4 times per year    Active Member of [employer] or Organizations: Yes    Attends Archivist Meetings: More than 4 times per year    Marital Status: Widowed  Intimate Partner Violence: Not At Risk (12/30/2021)   Humiliation, Afraid, Rape, and Kick questionnaire    Fear of Current or Ex-Partner: No    Emotionally Abused: No    Physically Abused:  No    Sexually Abused: No     Review of Systems: General: negative for chills, fever, night sweats or weight changes.  Cardiovascular: negative for chest pain, dyspnea on exertion, edema, orthopnea, palpitations, paroxysmal nocturnal dyspnea or shortness of breath Dermatological: negative for rash Respiratory: negative for cough or wheezing Urologic: negative for hematuria Abdominal: negative for nausea, vomiting, diarrhea, bright red blood per rectum, melena, or hematemesis Neurologic: negative for visual changes, syncope, or dizziness All other systems reviewed and are otherwise negative except as noted above.    Blood pressure (!) 140/90, pulse 68, height '5\' 1"'$  (1.549 m), weight 204 lb (92.5 kg).  General appearance: alert and no distress Neck: no adenopathy, no carotid bruit, no JVD, supple, symmetrical, trachea midline, and thyroid not enlarged, symmetric, no tenderness/mass/nodules Lungs: clear to auscultation bilaterally Heart: regular rate and rhythm, S1, S2 normal, no murmur, click, rub or gallop Extremities: extremities normal, atraumatic, no cyanosis or edema Pulses: 2+ and symmetric Skin: Skin color, texture, turgor normal. No rashes or lesions Neurologic: Grossly normal  EKG sinus rhythm at 68 with LVH voltage and nonspecific ST and T wave changes.  Personally reviewed this EKG.  ASSESSMENT AND PLAN:   Dyslipidemia, goal LDL below 70 History of dyslipidemia on statin therapy with lipid profile performed 04/09/2022 revealing total cholesterol  152, LDL 66 and HDL 50.  Essential hypertension History of essential hypertension a blood pressure measured today at 140/90.  She says at home her blood pressures much better.  She is on atenolol and losartan.  S/P CABG x 3 History of CAD status post cardiac catheterization performed by Dr. Ellyn Hack 05/25/2019 revealing severe two-vessel disease.  3 days later she underwent CABG x 3 by Dr. Servando Snare with a LIMA to her LAD, vein to ramus branch and diagonal branch.  She has been well since and denies chest pain or shortness of breath.     Lorretta Harp MD FACP,FACC,FAHA, [facility] 06/23/2022 11:10 AM

## 2022-06-23 NOTE — Assessment & Plan Note (Signed)
History of dyslipidemia on statin therapy with lipid profile performed 04/09/2022 revealing total cholesterol 152, LDL 66 and HDL 50.

## 2022-06-23 NOTE — Assessment & Plan Note (Signed)
History of essential hypertension a blood pressure measured today at 140/90.  She says at home her blood pressures much better.  She is on atenolol and losartan.

## 2022-06-30 ENCOUNTER — Encounter: Payer: Medicare Other | Admitting: Rehabilitative and Restorative Service Providers"

## 2022-07-28 DIAGNOSIS — H52203 Unspecified astigmatism, bilateral: Secondary | ICD-10-CM | POA: Diagnosis not present

## 2022-07-28 DIAGNOSIS — H35372 Puckering of macula, left eye: Secondary | ICD-10-CM | POA: Diagnosis not present

## 2022-07-28 DIAGNOSIS — Z961 Presence of intraocular lens: Secondary | ICD-10-CM | POA: Diagnosis not present

## 2022-08-09 ENCOUNTER — Other Ambulatory Visit: Payer: Self-pay | Admitting: Cardiovascular Disease

## 2022-09-06 ENCOUNTER — Other Ambulatory Visit: Payer: Self-pay | Admitting: Cardiovascular Disease

## 2022-09-29 ENCOUNTER — Emergency Department (HOSPITAL_COMMUNITY)
Admission: EM | Admit: 2022-09-29 | Discharge: 2022-09-30 | Disposition: A | Discharge status: Home / Self Care | Payer: Medicare Other | Attending: Emergency Medicine | Admitting: Emergency Medicine

## 2022-09-29 ENCOUNTER — Emergency Department (HOSPITAL_COMMUNITY): Payer: Medicare Other

## 2022-09-29 ENCOUNTER — Other Ambulatory Visit: Payer: Self-pay

## 2022-09-29 ENCOUNTER — Telehealth: Payer: Self-pay | Admitting: Adult Health

## 2022-09-29 ENCOUNTER — Encounter (HOSPITAL_COMMUNITY): Payer: Self-pay

## 2022-09-29 DIAGNOSIS — R42 Dizziness and giddiness: Secondary | ICD-10-CM

## 2022-09-29 DIAGNOSIS — E039 Hypothyroidism, unspecified: Secondary | ICD-10-CM | POA: Insufficient documentation

## 2022-09-29 DIAGNOSIS — Z7982 Long term (current) use of aspirin: Secondary | ICD-10-CM | POA: Diagnosis not present

## 2022-09-29 DIAGNOSIS — Z79899 Other long term (current) drug therapy: Secondary | ICD-10-CM | POA: Insufficient documentation

## 2022-09-29 DIAGNOSIS — I10 Essential (primary) hypertension: Secondary | ICD-10-CM | POA: Insufficient documentation

## 2022-09-29 LAB — CBC WITH DIFFERENTIAL/PLATELET
Abs Immature Granulocytes: 0.01 10*3/uL (ref 0.00–0.07)
Basophils Absolute: 0.1 10*3/uL (ref 0.0–0.1)
Basophils Relative: 1 %
Eosinophils Absolute: 0.3 10*3/uL (ref 0.0–0.5)
Eosinophils Relative: 4 %
HCT: 46.8 % — ABNORMAL HIGH (ref 36.0–46.0)
Hemoglobin: 14.3 g/dL (ref 12.0–15.0)
Immature Granulocytes: 0 %
Lymphocytes Relative: 33 %
Lymphs Abs: 2.4 10*3/uL (ref 0.7–4.0)
MCH: 25.8 pg — ABNORMAL LOW (ref 26.0–34.0)
MCHC: 30.6 g/dL (ref 30.0–36.0)
MCV: 84.5 fL (ref 80.0–100.0)
Monocytes Absolute: 0.7 10*3/uL (ref 0.1–1.0)
Monocytes Relative: 10 %
Neutro Abs: 3.8 10*3/uL (ref 1.7–7.7)
Neutrophils Relative %: 52 %
Platelets: 234 10*3/uL (ref 150–400)
RBC: 5.54 MIL/uL — ABNORMAL HIGH (ref 3.87–5.11)
RDW: 13.4 % (ref 11.5–15.5)
WBC: 7.4 10*3/uL (ref 4.0–10.5)
nRBC: 0 % (ref 0.0–0.2)

## 2022-09-29 LAB — BASIC METABOLIC PANEL
Anion gap: 9 (ref 5–15)
BUN: 30 mg/dL — ABNORMAL HIGH (ref 8–23)
CO2: 23 mmol/L (ref 22–32)
Calcium: 9.2 mg/dL (ref 8.9–10.3)
Chloride: 106 mmol/L (ref 98–111)
Creatinine, Ser: 1.17 mg/dL — ABNORMAL HIGH (ref 0.44–1.00)
GFR, Estimated: 47 mL/min — ABNORMAL LOW (ref >60)
Glucose, Bld: 154 mg/dL — ABNORMAL HIGH (ref 70–99)
Potassium: 4.1 mmol/L (ref 3.5–5.1)
Sodium: 138 mmol/L (ref 135–145)

## 2022-09-29 MED ORDER — HYDRALAZINE HCL 20 MG/ML IJ SOLN
5.0000 mg | Freq: Once | INTRAMUSCULAR | Status: AC
  Administered 2022-09-29: 5 mg via INTRAVENOUS
  Filled 2022-09-29: qty 1

## 2022-09-29 MED ORDER — LORAZEPAM 2 MG/ML IJ SOLN
0.5000 mg | Freq: Once | INTRAMUSCULAR | Status: DC | PRN
  Filled 2022-09-29: qty 1

## 2022-09-29 NOTE — ED Provider Notes (Incomplete)
[facility] Provider Note   CSN: [id number] Arrival date & time: 09/29/22  1318     History {Add pertinent medical, surgical, social history, OB history to HPI:1} Chief Complaint  Patient presents with  . Dizziness    Hannah Morrow is a 82 y.o. female.  HPI     82yo female with history of hypertension, hyperlipidemia, hypothyroidism, restless legs syndrome, who presents with concern for dizziness.  Past Medical History:  Diagnosis Date  . Allergy    no meds  . ANXIETY   . Cancer [facility])    cervical cancer - hysterectomy  . GERD   . Hyperlipidemia   . Hypertension   . Hypothyroid   . Plantar fasciitis    right  . RESTLESS LEGS SYNDROME      Home Medications Prior to Admission medications   Medication Sig Start Date End Date Taking? Authorizing Provider  Ascorbic Acid (VITAMIN C) 1000 MG tablet Take 1,000 mg by mouth daily.    [provider]  aspirin 81 MG tablet Take 1 tablet (81 mg total) by mouth daily. 10/02/19   Erlene Quan, PA-C  atenolol (TENORMIN) 25 MG tablet TAKE 1 TABLET DAILY 09/07/22   Lorretta Harp, MD  atorvastatin (LIPITOR) 40 MG tablet TAKE 1 TABLET DAILY 08/11/22   Lorretta Harp, MD  buPROPion (WELLBUTRIN XL) 150 MG 24 hr tablet TAKE 1 TABLET DAILY 06/16/22   Nafziger, Tommi Rumps, NP  calcium carbonate (TUMS - DOSED IN MG ELEMENTAL CALCIUM) 500 MG chewable tablet Chew 1 tablet by mouth daily as needed for indigestion or heartburn.     [provider]  Cholecalciferol (VITAMIN D) 2000 UNITS CAPS Take by mouth.    [provider]  fluocinonide ointment (LIDEX) [id number] % Apply 1 application topically daily as needed (itching). 04/15/20   Nafziger, Tommi Rumps, NP  ibuprofen (ADVIL) 200 MG tablet Take 400 mg by mouth in the morning and at bedtime.    [provider]  levothyroxine (SYNTHROID) 112 MCG tablet TAKE 1 TABLET DAILY 06/15/22   Nafziger, Tommi Rumps, NP  losartan  (COZAAR) 100 MG tablet TAKE 1 TABLET DAILY 03/09/22   Nafziger, Tommi Rumps, NP      Allergies    Penicillins, Coreg [carvedilol], Amlodipine, Metoprolol, Betadine [povidone iodine], Hydrocodone-acetaminophen, Lisinopril, and Tramadol    Review of Systems   Review of Systems  Physical Exam Updated Vital Signs BP (!) 196/80 (BP Location: Right Arm)   Pulse 73   Temp 97.9 F (36.6 C) (Oral)   Resp 20   Wt 92 kg   SpO2 98%   BMI 38.32 kg/m  Physical Exam  ED Results / Procedures / Treatments   Labs (all labs ordered are listed, but only abnormal results are displayed) Labs Reviewed  CBC WITH DIFFERENTIAL/PLATELET - Abnormal; Notable for the following components:      Result Value   RBC 5.54 (*)    HCT 46.8 (*)    MCH 25.8 (*)    All other components within normal limits  BASIC METABOLIC PANEL - Abnormal; Notable for the following components:   Glucose, Bld 154 (*)    BUN 30 (*)    Creatinine, Ser 1.17 (*)    GFR, Estimated 47 (*)    All other components within normal limits    EKG None  Radiology No results found.  Procedures Procedures  {Document cardiac monitor, telemetry assessment procedure when appropriate:1}  Medications Ordered in ED Medications - No  data to display  ED Course/ Medical Decision Making/ A&P   {   Click here for ABCD2, HEART and other calculatorsREFRESH Note before signing :1}                          Medical Decision Making   82yo female with history of hypertension, hyperlipidemia, hypothyroidism, restless legs syndrome, who presents with concern for dizziness.   Labs completed and personally evaluated interpreted by me show normal hemoglobin, no leukocytosis, no significant electrolyte abnormality.  EKG personally evaluated interpreted by me shows no significant arrhythmia or ST changes.   {Document critical care time when appropriate:1} {Document review of labs and clinical decision tools ie heart score, Chads2Vasc2 etc:1}   {Document your independent review of radiology images, and any outside records:1} {Document your discussion with family members, caretakers, and with consultants:1} {Document social determinants of health affecting pt's care:1} {Document your decision making why or why not admission, treatments were needed:1} Final Clinical Impression(s) / ED Diagnoses Final diagnoses:  None    Rx / DC Orders ED Discharge Orders     None

## 2022-09-29 NOTE — ED Provider Notes (Signed)
[place] EMERGENCY DEPARTMENT AT [facility] Provider Note   CSN: [id number] Arrival date & time: 09/29/22  1318     History  Chief Complaint  Patient presents with   Dizziness    Hannah Morrow is a 82 y.o. female.  HPI    82yo female with history of hypertension, hyperlipidemia, hypothyroidism, restless legs syndrome, who presents with concern for dizziness.   Last few weeks felt dizzy when bend over, when lay down in bed feels like bed moving.  Bending over to do anything, gets up and feels a little woozy. Only time feels like things moving is when lay down in bed, after a minute or two it is gone.  Other times if bend over to put dogs leash on and stand up too late feels more lightheaded.  BP terrible and it has never been this bad.  Nose runs all the time, thought was allergy related.  Has not blown nose since being here.  Maybe something in house causing allergies. Nose running is not new, has been years.  No ear pain or ringing in the ears. Just pressure, like face is full. No headache.  Denies numbness, weakness, difficulty talking or walking, visual changes or facial droop.  Walking feels balance is a little weird, like if need to turn in middle of street, feeling like going to fall. If walking straight feels ok.  No full room spinning.   No cp or dyspnea.   Dr. Mora Appl her on wellbutrin but has been on it for 3 months No other changes.  No nv/d/black or bloody stools, fevers, cough, urinary symptoms   Home Medications Prior to Admission medications   Medication Sig Start Date End Date Taking? Authorizing Provider  hydrochlorothiazide (HYDRODIURIL) 25 MG tablet Take 1 tablet (25 mg total) by mouth daily. 09/30/22 10/30/22 Yes Alvira Monday, MD  meclizine (ANTIVERT) 25 MG tablet Take 1 tablet (25 mg total) by mouth 3 (three) times daily as needed for dizziness. 09/30/22   Alvira Monday, MD  Ascorbic Acid (VITAMIN C) 1000 MG tablet Take 1,000 mg by  mouth daily.    [provider]  aspirin 81 MG tablet Take 1 tablet (81 mg total) by mouth daily. 10/02/19   Abelino Derrick, PA-C  atenolol (TENORMIN) 25 MG tablet TAKE 1 TABLET DAILY 09/07/22   Runell Gess, MD  atorvastatin (LIPITOR) 40 MG tablet TAKE 1 TABLET DAILY 08/11/22   Runell Gess, MD  buPROPion (WELLBUTRIN XL) 150 MG 24 hr tablet TAKE 1 TABLET DAILY 06/16/22   Nafziger, Kandee Keen, NP  calcium carbonate (TUMS - DOSED IN MG ELEMENTAL CALCIUM) 500 MG chewable tablet Chew 1 tablet by mouth daily as needed for indigestion or heartburn.     [provider]  Cholecalciferol (VITAMIN D) 2000 UNITS CAPS Take by mouth.    [provider]  fluocinonide ointment (LIDEX) 0.05 % Apply 1 application topically daily as needed (itching). 04/15/20   Nafziger, Kandee Keen, NP  ibuprofen (ADVIL) 200 MG tablet Take 400 mg by mouth in the morning and at bedtime.    [provider]  levothyroxine (SYNTHROID) 112 MCG tablet TAKE 1 TABLET DAILY 06/15/22   Nafziger, Kandee Keen, NP  losartan (COZAAR) 100 MG tablet TAKE 1 TABLET DAILY 03/09/22   Nafziger, Kandee Keen, NP      Allergies    Penicillins, Coreg [carvedilol], Amlodipine, Metoprolol, Betadine [povidone iodine], Hydrocodone-acetaminophen, Lisinopril, and Tramadol    Review of Systems   Review of Systems  Physical  Exam Updated Vital Signs BP (!) 191/80   Pulse 71   Temp 98.1 F (36.7 C)   Resp 17   Wt 92 kg   SpO2 97%   BMI 38.32 kg/m  Physical Exam Vitals and nursing note reviewed.  Constitutional:      General: She is not in acute distress.    Appearance: Normal appearance. She is well-developed. She is not ill-appearing or diaphoretic.  HENT:     Head: Normocephalic and atraumatic.  Eyes:     General: No visual field deficit.    Extraocular Movements: Extraocular movements intact.     Conjunctiva/sclera: Conjunctivae normal.     Pupils: Pupils are equal, round, and reactive to light.  Cardiovascular:     Rate and  Rhythm: Normal rate and regular rhythm.     Pulses: Normal pulses.     Heart sounds: Normal heart sounds. No murmur heard.    No friction rub. No gallop.  Pulmonary:     Effort: Pulmonary effort is normal. No respiratory distress.     Breath sounds: Normal breath sounds. No wheezing or rales.  Abdominal:     General: There is no distension.     Palpations: Abdomen is soft.     Tenderness: There is no abdominal tenderness. There is no guarding.  Musculoskeletal:        General: No swelling or tenderness.     Cervical back: Normal range of motion.  Skin:    General: Skin is warm and dry.     Findings: No erythema or rash.  Neurological:     General: No focal deficit present.     Mental Status: She is alert and oriented to person, place, and time.     GCS: GCS eye subscore is 4. GCS verbal subscore is 5. GCS motor subscore is 6.     Cranial Nerves: No cranial nerve deficit, dysarthria or facial asymmetry.     Sensory: No sensory deficit.     Motor: No weakness or tremor.     Coordination: Coordination normal. Finger-Nose-Finger Test normal.     Gait: Gait normal.     ED Results / Procedures / Treatments   Labs (all labs ordered are listed, but only abnormal results are displayed) Labs Reviewed  CBC WITH DIFFERENTIAL/PLATELET - Abnormal; Notable for the following components:      Result Value   RBC 5.54 (*)    HCT 46.8 (*)    MCH 25.8 (*)    All other components within normal limits  BASIC METABOLIC PANEL - Abnormal; Notable for the following components:   Glucose, Bld 154 (*)    BUN 30 (*)    Creatinine, Ser 1.17 (*)    GFR, Estimated 47 (*)    All other components within normal limits    EKG EKG Interpretation  Date/Time:  Wednesday September 29 2022 13:48:07 EDT Ventricular Rate:  72 PR Interval:  193 QRS Duration: 99 QT Interval:  407 QTC Calculation: 446 R Axis:   -30 Text Interpretation: Sinus rhythm Low voltage, precordial leads Left ventricular hypertrophy  Anterior Q waves, possibly due to LVH No significant change since last tracing Confirmed by Alvira Monday ([id number]) on 09/29/2022 4:27:28 PM  Radiology MR BRAIN WO CONTRAST  Result Date: 09/30/2022 CLINICAL DATA:  Dizziness EXAM: MRI HEAD WITHOUT CONTRAST TECHNIQUE: Multiplanar, multiecho pulse sequences of the brain and surrounding structures were obtained without intravenous contrast. COMPARISON:  08/30/2005 FINDINGS: Brain: No acute infarct, mass effect or extra-axial collection. No  chronic microhemorrhage or siderosis. There is multifocal hyperintense T2-weighted signal within the white matter. Generalized volume loss. The midline structures are normal. Vascular: Normal flow voids. Skull and upper cervical spine: Normal marrow signal. Sinuses/Orbits: Negative. Other: None. IMPRESSION: 1. No acute intracranial abnormality. 2. Findings of chronic microvascular ischemia and volume loss. Electronically Signed   By: Deatra Robinson M.D.   On: 09/30/2022 00:17   CT Head Wo Contrast  Result Date: 09/29/2022 CLINICAL DATA:  Syncope/presyncope, cerebrovascular cause suspected. Dizziness x2 weeks. EXAM: CT HEAD WITHOUT CONTRAST TECHNIQUE: Contiguous axial images were obtained from the base of the skull through the vertex without intravenous contrast. RADIATION DOSE REDUCTION: This exam was performed according to the departmental dose-optimization program which includes automated exposure control, adjustment of the mA and/or kV according to patient size and/or use of iterative reconstruction technique. COMPARISON:  MRI brain 08/30/2005.  Head CT 08/26/2005. FINDINGS: Brain: No acute hemorrhage, mass effect or midline shift. Gray-white differentiation is preserved. No hydrocephalus. Focal nodular thickening of the posterior falx (image 22 series 2), stable since 2007 and favored to represent nodular dural calcification. Vascular: No hyperdense vessel or unexpected calcification. Skull: No calvarial fracture or  suspicious bone lesion. Skull base is unremarkable. Sinuses/Orbits: Unremarkable. Other: None. IMPRESSION: No acute intracranial abnormality. Electronically Signed   By: Orvan Falconer M.D.   On: 09/29/2022 17:00    Procedures Procedures    Medications Ordered in ED Medications  hydrALAZINE (APRESOLINE) injection 5 mg (5 mg Intravenous Given 09/29/22 1845)    ED Course/ Medical Decision Making/ A&P                              81yo female with history of hypertension, hyperlipidemia, hypothyroidism, restless legs syndrome, who presents with concern for dizziness.  Differential diagnosis includes etiologies of lightheadedness such as anemia, electrolyte abnormality, infection, dehydration, medication changes, orthostatic hypotension as well as etiologies of disequilibrium and vertigo such as peripheral vertigo or central etiology such as CVA, ICH.   Labs completed and personally evaluated interpreted by me show normal hemoglobin, no leukocytosis, no significant electrolyte abnormality.   EKG personally evaluated interpreted by me shows no significant arrhythmia or ST changes.  CT head completed and personally about interpreted by me shows no evidence of intracranial hemorrhage.  Given risk factors for CVA, significant hypertension, will obtain MRI for further evaluation for posterior circulation CVA.  MRI completed does not show signs of CVA.  Symptoms may be secondary to hypertension or peripheral vertigo.  Does not have signs of hypertensive encephalopathy or hypertensive emergency.  Will initiate new rx for hydrochlorothiazide and recommend PCP follow up. Added meclizine that she may also try for possible peripheral vertigo. Patient discharged in stable condition with understanding of reasons to return.          Final Clinical Impression(s) / ED Diagnoses Final diagnoses:  Dizziness  Hypertension, unspecified type    Rx / DC Orders ED Discharge Orders          Ordered     hydrochlorothiazide (HYDRODIURIL) 25 MG tablet  Daily        09/30/22 0048              Alvira Monday, MD 09/30/22 [phone]

## 2022-09-29 NOTE — ED Triage Notes (Signed)
C/o intermittent dizziness with laying down in bed, getting up to fast, and leaving over.  Denies sob, cp.

## 2022-09-29 NOTE — ED Provider Triage Note (Signed)
Emergency Medicine Provider Triage Evaluation Note  Hannah Morrow , a 82 y.o. female  was evaluated in triage.  Pt complains of dizziness x 2 weeks.  Patient states dizziness occurs with change in position.  She notes her face feels "full" with some sinus pressure.  Daughter at bedside notes patient has been having difficulties with her balance.  No speech changes, visual changes, or unilateral weakness.  No history of vertigo.  No previous CVA.  Denies chest pain.  Review of Systems  Positive: dizziness Negative: CP  Physical Exam  BP (!) 196/80 (BP Location: Right Arm)   Pulse 73   Temp 97.9 F (36.6 C) (Oral)   Resp 20   Wt 92 kg   SpO2 98%   BMI 38.32 kg/m  Gen:   Awake, no distress   Resp:  Normal effort  MSK:   Moves extremities without difficulty  Other:    Medical Decision Making  Medically screening exam initiated at 2:15 PM.  Appropriate orders placed.  Hannah Morrow was informed that the remainder of the evaluation will be completed by another provider, this initial triage assessment does not replace that evaluation, and the importance of remaining in the ED until their evaluation is complete.  Routine labs EKG CT head   Suzy Bouchard, PA-C 09/29/22 1416

## 2022-09-29 NOTE — Telephone Encounter (Signed)
FYI: This call has been transferred to Access Nurse. Once the result note has been entered staff can address the message at that time.  Patient called in with the following symptoms:  Red Word:dizziness    Please advise at Mobile [phone] (mobile)  Message is routed to Provider Pool and [facility] Triage

## 2022-09-29 NOTE — ED Notes (Signed)
Family updated as to patient's status.

## 2022-09-30 ENCOUNTER — Telehealth (HOSPITAL_COMMUNITY): Payer: Self-pay | Admitting: Emergency Medicine

## 2022-09-30 MED ORDER — MECLIZINE HCL 25 MG PO TABS: 25.0000 mg | ORAL_TABLET | Freq: Three times a day (TID) | ORAL | 0 refills | Status: DC | PRN

## 2022-09-30 MED ORDER — HYDROCHLOROTHIAZIDE 25 MG PO TABS: 25.0000 mg | ORAL_TABLET | Freq: Every day | ORAL | 0 refills | Status: DC

## 2022-09-30 NOTE — Telephone Encounter (Signed)
Added meclizine prescription

## 2022-10-05 ENCOUNTER — Telehealth: Payer: Self-pay | Admitting: *Deleted

## 2022-10-05 NOTE — Telephone Encounter (Signed)
        Patient  visited [facility] on 09/30/2022  for treatment    Telephone encounter attempt :  1st  A HIPAA compliant voice message was left requesting a return call.  Instructed patient to call back at [phone]. [place] [phone] [address] , [place] [address] Email : [email or web address]

## 2022-10-06 ENCOUNTER — Ambulatory Visit: Payer: Medicare Other | Admitting: Adult Health

## 2022-10-06 ENCOUNTER — Telehealth: Payer: Self-pay | Admitting: *Deleted

## 2022-10-06 NOTE — Telephone Encounter (Signed)
     Patient  visit on 09/30/2022 at [facility] long ed  was for treatment  Patient with follow up visit tomorrow and was able to get medicine and has transportation  Have you been able to follow up with your primary care physician?  The patient was able to obtain any needed medicine or equipment.  Are there diet recommendations that you are having difficulty following?  Patient expresses understanding of discharge instructions and education provided has no other needs at this time.    [place] [phone] [address] , [place] [address] Email : [email or web address]

## 2022-10-07 ENCOUNTER — Encounter: Payer: Self-pay | Admitting: Adult Health

## 2022-10-07 ENCOUNTER — Ambulatory Visit (INDEPENDENT_AMBULATORY_CARE_PROVIDER_SITE_OTHER): Payer: Medicare Other | Admitting: Adult Health

## 2022-10-07 VITALS — BP 142/98 | HR 64 | Temp 97.6°F | Ht 61.0 in | Wt 204.0 lb

## 2022-10-07 DIAGNOSIS — I10 Essential (primary) hypertension: Secondary | ICD-10-CM

## 2022-10-07 DIAGNOSIS — R42 Dizziness and giddiness: Secondary | ICD-10-CM | POA: Diagnosis not present

## 2022-10-07 MED ORDER — SPIRONOLACTONE 25 MG PO TABS: 25.0000 mg | ORAL_TABLET | Freq: Every day | ORAL | 0 refills | Status: DC

## 2022-10-07 NOTE — Progress Notes (Signed)
Subjective:    Patient ID: Hannah Morrow, female    DOB: [birth year], 82 y.o.   MRN: [id number]  HPI 82 year old female who  has a past medical history of Allergy, ANXIETY, Cancer, GERD, Hyperlipidemia, Hypertension, Hypothyroid, Plantar fasciitis, and RESTLESS LEGS SYNDROME.  She presents to the office today for follow up on hypertension after being seen in the ER.   She was seen in the emergency room about a week ago for feeling dizzy when bending over as well is felt that when she was laying in bed that the bed was moving.  There are times that she was bending over to put her dog's leash on and stood up too quickly she would feel lightheaded.  This was going on for roughly 2 weeks.  She had not been checking her blood pressure at home during this time.  She did not complain of any chest pain, dyspnea, nausea, vomiting, diarrhea, fevers, or chills.  Blood Pressure was 191/80 in the emergency room.  Showed normal hemoglobin, no leukocytosis or significant electrolyte abnormality.  KG showed no significant arrhythmia or ST changes.  She had a CT of the head done which showed no evidence of intracranial hemorrhage, MRI did not show any signs of CVA.  She was started on HCTZ  25 mg daily in addition to her chronic HTN medications of Atenolol 25 mg daily and Cozaar 100 mg daily.  She was also given a prescription for meclizine for suspected vertigo.  Today she reports that the dizziness is better, she only took 1 dose of meclizine after being seen in the emergency room but this made her too sleepy so she did not take it any longer.  Has been taking her HCTZ but believes that this is causing fatigue.  She has been monitoring her blood pressures with readings in the 140s to 170s over 70s to 80s.    Review of Systems See HPI   Past Medical History:  Diagnosis Date   Allergy    no meds   ANXIETY    Cancer    cervical cancer - hysterectomy   GERD    Hyperlipidemia    Hypertension     Hypothyroid    Plantar fasciitis    right   RESTLESS LEGS SYNDROME     Social History   Socioeconomic History   Marital status: Widowed    Spouse name: Not on file   Number of children: 2   Years of education: Not on file   Highest education level: Some college, no degree  Occupational History   Not on file  Tobacco Use   Smoking status: Never   Smokeless tobacco: Never  Vaping Use   Vaping Use: Not on file  Substance and Sexual Activity   Alcohol use: Yes    Alcohol/week: 5.0 standard drinks of alcohol    Types: 5 Glasses of wine per week    Comment: Occasional   Drug use: No   Sexual activity: Yes    Partners: Male    Birth control/protection: Surgical, Other-see comments    Comment: hysterectomy  Other Topics Concern   Not on file  Social History Narrative   Married for 90 years   Two daughters, five grand children and 5 great grand children. Daughter lives in [place]. Other daughter lives in [place].    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    Exercise Does  not exercise. Was going to exercise class but has since quite.    Diet: Veggies, chicken, salmon. Very little red meat. Eats lots of eggs.    Exercise --- walking , gym 3 classes a week   Social Determinants of Health   Financial Resource Strain: Low Risk  (10/03/2022)   Overall Financial Resource Strain (CARDIA)    Difficulty of Paying Living Expenses: Not hard at all  Food Insecurity: No Food Insecurity (10/03/2022)   Hunger Vital Sign    Worried About Running Out of Food in the Last Year: Never true    Ran Out of Food in the Last Year: Never true  Transportation Needs: No Transportation Needs (10/03/2022)   PRAPARE - Hydrologist (Medical): No    Lack of Transportation (Non-Medical): No  Physical Activity: Insufficiently Active (10/03/2022)   Exercise Vital Sign    Days of Exercise per Week: 3 days    Minutes of Exercise  per Session: 10 min  Stress: No Stress Concern Present (10/03/2022)   [place]    Feeling of Stress : Only a little  Social Connections: Moderately Integrated (10/03/2022)   Social Connection and Isolation Panel [NHANES]    Frequency of Communication with Friends and Family: More than three times a week    Frequency of Social Gatherings with Friends and Family: Twice a week    Attends Religious Services: More than 4 times per year    Active Member of [employer] or Organizations: Yes    Attends Archivist Meetings: More than 4 times per year    Marital Status: Widowed  Intimate Partner Violence: Not At Risk (12/30/2021)   Humiliation, Afraid, Rape, and Kick questionnaire    Fear of Current or Ex-Partner: No    Emotionally Abused: No    Physically Abused: No    Sexually Abused: No    Past Surgical History:  Procedure Laterality Date   ABDOMINAL HYSTERECTOMY     BREAST SURGERY     Bilateral breast reduction   COLOSTOMY  04-22-2010   [facility]   cone bx     cervix   CORONARY ARTERY BYPASS GRAFT N/A 05/28/2019   Procedure: CORONARY ARTERY BYPASS GRAFTING (CABG) using the LIMA to LAD; Endoscopic right saphenous vein harvesting to the Ramus and Diag1.;  Surgeon: Grace Isaac, MD;  Location: [facility];  Service: Open Heart Surgery;  Laterality: N/A;   LEFT HEART CATH AND CORONARY ANGIOGRAPHY N/A 05/25/2019   Procedure: LEFT HEART CATH AND CORONARY ANGIOGRAPHY;  Surgeon: Leonie Man, MD;  Location: [facility] CV LAB;  Service: Cardiovascular;  Laterality: N/A;   REDUCTION MAMMAPLASTY Bilateral    TEE WITHOUT CARDIOVERSION N/A 05/28/2019   Procedure: TRANSESOPHAGEAL ECHOCARDIOGRAM (TEE);  Surgeon: Grace Isaac, MD;  Location: [facility];  Service: Open Heart Surgery;  Laterality: N/A;   WISDOM TOOTH EXTRACTION      Family History  Problem Relation Age of Onset   Cancer Sister        melanoma- with mets    Colon cancer Father 69   Stomach cancer Maternal Grandfather    Breast cancer Mother 63   Breast cancer Daughter 42   Rectal cancer Neg Hx    Esophageal cancer Neg Hx     Allergies  Allergen Reactions   Penicillins Shortness Of Breath    red face Did it involve swelling of the face/tongue/throat, SOB, or low BP? Yes Did it  involve sudden or severe rash/hives, skin peeling, or any reaction on the inside of your mouth or nose? Yes Did you need to seek medical attention at a hospital or doctor's office? Yes When did it last happen?      40+ years If all above answers are "NO", may proceed with cephalosporin use.    Coreg [Carvedilol] Rash   Amlodipine     Pt states it made her feel "awful and weird."   Metoprolol Other (See Comments)    " feel weird"   Betadine [Povidone Iodine] Rash   Hydrocodone-Acetaminophen Rash    ras   Lisinopril     Fatigue    Tramadol Itching and Rash    Current Outpatient Medications on File Prior to Visit  Medication Sig Dispense Refill   Ascorbic Acid (VITAMIN C) 1000 MG tablet Take 1,000 mg by mouth daily.     aspirin 81 MG tablet Take 1 tablet (81 mg total) by mouth daily.     atenolol (TENORMIN) 25 MG tablet TAKE 1 TABLET DAILY 90 tablet 3   atorvastatin (LIPITOR) 40 MG tablet TAKE 1 TABLET DAILY 90 tablet 3   buPROPion (WELLBUTRIN XL) 150 MG 24 hr tablet TAKE 1 TABLET DAILY 90 tablet 3   Cholecalciferol (VITAMIN D) 2000 UNITS CAPS Take by mouth.     fluocinonide ointment (LIDEX) [id number] % Apply 1 application topically daily as needed (itching). 60 g 1   ibuprofen (ADVIL) 200 MG tablet Take 400 mg by mouth in the morning and at bedtime.     levothyroxine (SYNTHROID) 112 MCG tablet TAKE 1 TABLET DAILY 90 tablet 3   losartan (COZAAR) 100 MG tablet TAKE 1 TABLET DAILY 90 tablet 3   No current facility-administered medications on file prior to visit.    BP (!) 142/98   Pulse 64   Temp 97.6 F (36.4 C) (Oral)   Ht 5\' 1"  (1.549 m)   Wt 204 lb  (92.5 kg)   SpO2 98%   BMI 38.55 kg/m       Objective:   Physical Exam Vitals and nursing note reviewed.  Constitutional:      Appearance: Normal appearance.  Cardiovascular:     Rate and Rhythm: Normal rate and regular rhythm.     Pulses: Normal pulses.     Heart sounds: Normal heart sounds.  Musculoskeletal:        General: Normal range of motion.  Skin:    General: Skin is warm and dry.  Neurological:     General: No focal deficit present.     Mental Status: She is alert and oriented to person, place, and time.  Psychiatric:        Mood and Affect: Mood normal.        Behavior: Behavior normal.        Thought Content: Thought content normal.        Judgment: Judgment normal.       Assessment & Plan:  1. Essential hypertension -Blood pressure 142/98 in the office today.  I am going to have her stop HCTZ and switch her to spironolactone.  Will have her follow-up in 3 weeks or sooner if needed - spironolactone (ALDACTONE) 25 MG tablet; Take 1 tablet (25 mg total) by mouth daily.  Dispense: 30 tablet; Refill: 0  2. Vertigo - If symptoms return she can try non drosy dramamine   [name], NP  Time spent with patient today was 32 minutes which consisted of chart review, discussing  hypertension and vertigo,  work up, treatment answering questions and documentation.

## 2022-10-11 ENCOUNTER — Encounter: Payer: Self-pay | Admitting: Adult Health

## 2022-10-12 NOTE — Telephone Encounter (Signed)
Please advise 

## 2022-10-28 ENCOUNTER — Ambulatory Visit (INDEPENDENT_AMBULATORY_CARE_PROVIDER_SITE_OTHER): Payer: Medicare Other | Admitting: Adult Health

## 2022-10-28 ENCOUNTER — Encounter: Payer: Self-pay | Admitting: Adult Health

## 2022-10-28 ENCOUNTER — Telehealth: Payer: Self-pay | Admitting: Adult Health

## 2022-10-28 DIAGNOSIS — I10 Essential (primary) hypertension: Secondary | ICD-10-CM | POA: Diagnosis not present

## 2022-10-28 LAB — BASIC METABOLIC PANEL
BUN: 40 mg/dL — ABNORMAL HIGH (ref 6–23)
CO2: 25 mEq/L (ref 19–32)
Calcium: 10.1 mg/dL (ref 8.4–10.5)
Chloride: 105 mEq/L (ref 96–112)
Creatinine, Ser: 1.67 mg/dL — ABNORMAL HIGH (ref 0.40–1.20)
GFR: 28.74 mL/min — ABNORMAL LOW (ref >60.00)
Glucose, Bld: 86 mg/dL (ref 70–99)
Potassium: 6 mEq/L — ABNORMAL HIGH (ref 3.5–5.1)
Sodium: 137 mEq/L (ref 135–145)

## 2022-10-28 MED ORDER — ATENOLOL 50 MG PO TABS: 50.0000 mg | ORAL_TABLET | Freq: Every day | ORAL | 0 refills | Status: DC

## 2022-10-28 MED ORDER — SPIRONOLACTONE 25 MG PO TABS: 25.0000 mg | ORAL_TABLET | Freq: Every day | ORAL | 1 refills | Status: DC

## 2022-10-28 NOTE — Progress Notes (Signed)
Subjective:    Patient ID: Hannah Morrow, female    DOB: 19-Dec-1940, 82 y.o.   MRN: [id number]  HPI 82 year old female who  has a past medical history of Allergy, ANXIETY, Cancer, GERD, Hyperlipidemia, Hypertension, Hypothyroid, Plantar fasciitis, and RESTLESS LEGS SYNDROME.  Presents to the office today for 3-week follow-up regarding hypertension.  He was last seen it was for hospital follow-up after she was seen in the ER for feeling dizzy when bending over.  She had not been checking her blood pressure at home during this time when she was in the emergency room her blood pressure is 191/80.  He was started on HCTZ 25 mg in addition to her already prescribed atenolol 25 mg daily and Cozaar 100 mg daily.  She started taking HCTZ she developed fatigue.  Her blood pressures did improve into the 140s over 70's.  In the office her blood pressure is 142/98.  At this time we stopped her HCTZ and switch her to spironolactone 25 mg daily.  She has been tolerating this medication well. At home her blood pressures are usually in the 130's/60-70's. She has not had any symptoms of hypotension.   BP Readings from Last 3 Encounters:  10/28/22 130/70  10/07/22 (!) 142/98  09/29/22 (!) 191/80    Review of Systems See HPI   Past Medical History:  Diagnosis Date   Allergy    no meds   ANXIETY    Cancer    cervical cancer - hysterectomy   GERD    Hyperlipidemia    Hypertension    Hypothyroid    Plantar fasciitis    right   RESTLESS LEGS SYNDROME     Social History   Socioeconomic History   Marital status: Widowed    Spouse name: Not on file   Number of children: 2   Years of education: Not on file   Highest education level: Some college, no degree  Occupational History   Not on file  Tobacco Use   Smoking status: Never   Smokeless tobacco: Never  Vaping Use   Vaping Use: Not on file  Substance and Sexual Activity   Alcohol use: Yes    Alcohol/week: 5.0 standard drinks  of alcohol    Types: 5 Glasses of wine per week    Comment: Occasional   Drug use: No   Sexual activity: Yes    Partners: Male    Birth control/protection: Surgical, Other-see comments    Comment: hysterectomy  Other Topics Concern   Not on file  Social History Narrative   Married for 35 years   Two daughters, five grand children and 5 great grand children. Daughter lives in [place]. Other daughter lives in [place].    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    Exercise Does not exercise. Was going to exercise class but has since quite.    Diet: Veggies, chicken, salmon. Very little red meat. Eats lots of eggs.    Exercise --- walking , gym 3 classes a week   Social Determinants of Health   Financial Resource Strain: Low Risk  (10/03/2022)   Overall Financial Resource Strain (CARDIA)    Difficulty of Paying Living Expenses: Not hard at all  Food Insecurity: No Food Insecurity (10/03/2022)   Hunger Vital Sign    Worried About Running Out of Food in the Last Year: Never true    Ran Out  of Food in the Last Year: Never true  Transportation Needs: No Transportation Needs (10/03/2022)   PRAPARE - Administrator, Civil Service (Medical): No    Lack of Transportation (Non-Medical): No  Physical Activity: Insufficiently Active (10/03/2022)   Exercise Vital Sign    Days of Exercise per Week: 3 days    Minutes of Exercise per Session: 10 min  Stress: No Stress Concern Present (10/03/2022)   [employer] of Occupational Health - Occupational Stress Questionnaire    Feeling of Stress : Only a little  Social Connections: Moderately Integrated (10/03/2022)   Social Connection and Isolation Panel [NHANES]    Frequency of Communication with Friends and Family: More than three times a week    Frequency of Social Gatherings with Friends and Family: Twice a week    Attends Religious Services: More than 4 times per year    Active  Member of [employer] or Organizations: Yes    Attends Banker Meetings: More than 4 times per year    Marital Status: Widowed  Intimate Partner Violence: Not At Risk (12/30/2021)   Humiliation, Afraid, Rape, and Kick questionnaire    Fear of Current or Ex-Partner: No    Emotionally Abused: No    Physically Abused: No    Sexually Abused: No    Past Surgical History:  Procedure Laterality Date   ABDOMINAL HYSTERECTOMY     BREAST SURGERY     Bilateral breast reduction   COLOSTOMY  04-22-2010   [facility]   cone bx     cervix   CORONARY ARTERY BYPASS GRAFT N/A 05/28/2019   Procedure: CORONARY ARTERY BYPASS GRAFTING (CABG) using the LIMA to LAD; Endoscopic right saphenous vein harvesting to the Ramus and Diag1.;  Surgeon: Delight Ovens, MD;  Location: [facility] OR;  Service: Open Heart Surgery;  Laterality: N/A;   LEFT HEART CATH AND CORONARY ANGIOGRAPHY N/A 05/25/2019   Procedure: LEFT HEART CATH AND CORONARY ANGIOGRAPHY;  Surgeon: Marykay Lex, MD;  Location: [facility] INVASIVE CV LAB;  Service: Cardiovascular;  Laterality: N/A;   REDUCTION MAMMAPLASTY Bilateral    TEE WITHOUT CARDIOVERSION N/A 05/28/2019   Procedure: TRANSESOPHAGEAL ECHOCARDIOGRAM (TEE);  Surgeon: Delight Ovens, MD;  Location: [facility];  Service: Open Heart Surgery;  Laterality: N/A;   WISDOM TOOTH EXTRACTION      Family History  Problem Relation Age of Onset   Cancer Sister        melanoma- with mets   Colon cancer Father 55   Stomach cancer Maternal Grandfather    Breast cancer Mother 41   Breast cancer Daughter 50   Rectal cancer Neg Hx    Esophageal cancer Neg Hx     Allergies  Allergen Reactions   Penicillins Shortness Of Breath    red face Did it involve swelling of the face/tongue/throat, SOB, or low BP? Yes Did it involve sudden or severe rash/hives, skin peeling, or any reaction on the inside of your mouth or nose? Yes Did you need to seek medical attention at a hospital or doctor's office?  Yes When did it last happen?      40+ years If all above answers are "NO", may proceed with cephalosporin use.    Coreg [Carvedilol] Rash   Amlodipine     Pt states it made her feel "awful and weird."   Metoprolol Other (See Comments)    " feel weird"   Betadine [Povidone Iodine] Rash   Hydrocodone-Acetaminophen Rash    ras  Lisinopril     Fatigue    Tramadol Itching and Rash    Current Outpatient Medications on File Prior to Visit  Medication Sig Dispense Refill   Ascorbic Acid (VITAMIN C) 1000 MG tablet Take 1,000 mg by mouth daily.     aspirin 81 MG tablet Take 1 tablet (81 mg total) by mouth daily.     atenolol (TENORMIN) 25 MG tablet TAKE 1 TABLET DAILY 90 tablet 3   atorvastatin (LIPITOR) 40 MG tablet TAKE 1 TABLET DAILY 90 tablet 3   buPROPion (WELLBUTRIN XL) 150 MG 24 hr tablet TAKE 1 TABLET DAILY 90 tablet 3   Cholecalciferol (VITAMIN D) 2000 UNITS CAPS Take by mouth.     fluocinonide ointment (LIDEX) 0.05 % Apply 1 application topically daily as needed (itching). 60 g 1   ibuprofen (ADVIL) 200 MG tablet Take 400 mg by mouth in the morning and at bedtime.     levothyroxine (SYNTHROID) 112 MCG tablet TAKE 1 TABLET DAILY 90 tablet 3   losartan (COZAAR) 100 MG tablet TAKE 1 TABLET DAILY 90 tablet 3   No current facility-administered medications on file prior to visit.    BP 130/70   Pulse 62   Temp 97.6 F (36.4 C) (Oral)   Ht 5\' 1"  (1.549 m)   Wt 205 lb (93 kg)   SpO2 97%   BMI 38.73 kg/m       Objective:   Physical Exam Vitals and nursing note reviewed.  Constitutional:      Appearance: Normal appearance.  Cardiovascular:     Rate and Rhythm: Normal rate and regular rhythm.     Pulses: Normal pulses.     Heart sounds: Normal heart sounds.  Pulmonary:     Effort: Pulmonary effort is normal.     Breath sounds: Normal breath sounds.  Skin:    General: Skin is warm and dry.  Neurological:     General: No focal deficit present.     Mental Status: She  is alert and oriented to person, place, and time.  Psychiatric:        Mood and Affect: Mood normal.        Behavior: Behavior normal.        Thought Content: Thought content normal.        Judgment: Judgment normal.       Assessment & Plan:  1. Essential hypertension - BP at goal. Will check BMP today since she is also on losartan  - spironolactone (ALDACTONE) 25 MG tablet; Take 1 tablet (25 mg total) by mouth daily.  Dispense: 90 tablet; Refill: 1 - Basic Metabolic Panel; Future - Basic Metabolic Panel  Shirline Frees, NP

## 2022-10-28 NOTE — Telephone Encounter (Signed)
Updated patient on her labs. I will have her stop spriloactone as her potassium has increase to 6.0   We will increase her Atenolol to 50 mg daily. 30 days of atenolol sent in   She will follow up in 30 days

## 2022-11-01 ENCOUNTER — Encounter: Payer: Self-pay | Admitting: Adult Health

## 2022-11-03 ENCOUNTER — Other Ambulatory Visit: Payer: Self-pay | Admitting: Adult Health

## 2022-11-03 DIAGNOSIS — I10 Essential (primary) hypertension: Secondary | ICD-10-CM

## 2022-11-03 NOTE — Telephone Encounter (Signed)
  The original prescription was discontinued on 10/28/2022 by Shirline Frees, NP for the following reason: Reorder. Renewing this prescription may not be appropriate.

## 2022-11-18 ENCOUNTER — Encounter: Payer: Self-pay | Admitting: Adult Health

## 2022-11-18 MED ORDER — ATENOLOL 50 MG PO TABS: 50.0000 mg | ORAL_TABLET | Freq: Every day | ORAL | 0 refills | Status: DC

## 2022-12-07 ENCOUNTER — Encounter: Payer: Self-pay | Admitting: Adult Health

## 2022-12-07 ENCOUNTER — Ambulatory Visit (INDEPENDENT_AMBULATORY_CARE_PROVIDER_SITE_OTHER): Payer: Medicare Other | Admitting: Adult Health

## 2022-12-07 VITALS — BP 128/80 | HR 60 | Temp 98.2°F | Ht 61.0 in | Wt 204.0 lb

## 2022-12-07 DIAGNOSIS — I10 Essential (primary) hypertension: Secondary | ICD-10-CM | POA: Diagnosis not present

## 2022-12-07 DIAGNOSIS — E875 Hyperkalemia: Secondary | ICD-10-CM

## 2022-12-07 LAB — BASIC METABOLIC PANEL
BUN: 30 mg/dL — ABNORMAL HIGH (ref 6–23)
CO2: 27 mEq/L (ref 19–32)
Calcium: 9.9 mg/dL (ref 8.4–10.5)
Chloride: 103 mEq/L (ref 96–112)
Creatinine, Ser: 1.58 mg/dL — ABNORMAL HIGH (ref 0.40–1.20)
GFR: 30.69 mL/min — ABNORMAL LOW (ref >60.00)
Glucose, Bld: 102 mg/dL — ABNORMAL HIGH (ref 70–99)
Potassium: 5 mEq/L (ref 3.5–5.1)
Sodium: 138 mEq/L (ref 135–145)

## 2022-12-07 NOTE — Progress Notes (Signed)
Subjective:    Patient ID: Hannah Morrow, female    DOB: [birth year], 82 y.o.   MRN: [id number]  HPI 82 year old female who  has a past medical history of Allergy, ANXIETY, Cancer (HCC), GERD, Hyperlipidemia, Hypertension, Hypothyroid, Plantar fasciitis, and RESTLESS LEGS SYNDROME.  She presents to the office today for follow up regarding hypertension. She was on Spirolactone but this caused decreased kidney function and hyperkalemia. Atenolol was increased from 25 mg to 50 mg daily. She was kept on Losartan 100 mg. She has been monitoring her blood pressure at home with readings in the 130s over 70s.  She denies dizziness, lightheadedness, chest pain, or shortness of breath.  She has also been working on weight loss through exercising more and eating healthier but has been unable to lose weight.  She reports that she has been heavy most of her life and has been through multiple diets.  She states "I have lost the same 60 pounds over and over".  She is interested in the weight loss center to help her lose weight   Wt Readings from Last 3 Encounters:  12/07/22 204 lb (92.5 kg)  10/28/22 205 lb (93 kg)  10/07/22 204 lb (92.5 kg)      Review of Systems See HPI   Past Medical History:  Diagnosis Date   Allergy    no meds   ANXIETY    Cancer (HCC)    cervical cancer - hysterectomy   GERD    Hyperlipidemia    Hypertension    Hypothyroid    Plantar fasciitis    right   RESTLESS LEGS SYNDROME     Social History   Socioeconomic History   Marital status: Widowed    Spouse name: Not on file   Number of children: 2   Years of education: Not on file   Highest education level: Some college, no degree  Occupational History   Not on file  Tobacco Use   Smoking status: Never   Smokeless tobacco: Never  Vaping Use   Vaping Use: Not on file  Substance and Sexual Activity   Alcohol use: Yes    Alcohol/week: 5.0 standard drinks of alcohol    Types: 5 Glasses of wine per  week    Comment: Occasional   Drug use: No   Sexual activity: Yes    Partners: Male    Birth control/protection: Surgical, Other-see comments    Comment: hysterectomy  Other Topics Concern   Not on file  Social History Narrative   Married for 35 years   Two daughters, five grand children and 5 great grand children. Daughter lives in [place]. Other daughter lives in [place].    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    Exercise Does not exercise. Was going to exercise class but has since quite.    Diet: Veggies, chicken, salmon. Very little red meat. Eats lots of eggs.    Exercise --- walking , gym 3 classes a week   Social Determinants of Health   Financial Resource Strain: Low Risk  (10/03/2022)   Overall Financial Resource Strain (CARDIA)    Difficulty of Paying Living Expenses: Not hard at all  Food Insecurity: No Food Insecurity (10/03/2022)   Hunger Vital Sign    Worried About Running Out of Food in the Last Year: Never true    Ran Out of Food in the Last Year: Never  true  Transportation Needs: No Transportation Needs (10/03/2022)   PRAPARE - Administrator, Civil Service (Medical): No    Lack of Transportation (Non-Medical): No  Physical Activity: Insufficiently Active (10/03/2022)   Exercise Vital Sign    Days of Exercise per Week: 3 days    Minutes of Exercise per Session: 10 min  Stress: No Stress Concern Present (10/03/2022)   [employer] of Occupational Health - Occupational Stress Questionnaire    Feeling of Stress : Only a little  Social Connections: Moderately Integrated (10/03/2022)   Social Connection and Isolation Panel [NHANES]    Frequency of Communication with Friends and Family: More than three times a week    Frequency of Social Gatherings with Friends and Family: Twice a week    Attends Religious Services: More than 4 times per year    Active Member of [employer] or Organizations: Yes    Attends  Banker Meetings: More than 4 times per year    Marital Status: Widowed  Intimate Partner Violence: Not At Risk (12/30/2021)   Humiliation, Afraid, Rape, and Kick questionnaire    Fear of Current or Ex-Partner: No    Emotionally Abused: No    Physically Abused: No    Sexually Abused: No    Past Surgical History:  Procedure Laterality Date   ABDOMINAL HYSTERECTOMY     BREAST SURGERY     Bilateral breast reduction   COLOSTOMY  04-22-2010   [facility]   cone bx     cervix   CORONARY ARTERY BYPASS GRAFT N/A 05/28/2019   Procedure: CORONARY ARTERY BYPASS GRAFTING (CABG) using the LIMA to LAD; Endoscopic right saphenous vein harvesting to the Ramus and Diag1.;  Surgeon: Delight Ovens, MD;  Location: [facility] OR;  Service: Open Heart Surgery;  Laterality: N/A;   LEFT HEART CATH AND CORONARY ANGIOGRAPHY N/A 05/25/2019   Procedure: LEFT HEART CATH AND CORONARY ANGIOGRAPHY;  Surgeon: Marykay Lex, MD;  Location: [facility] INVASIVE CV LAB;  Service: Cardiovascular;  Laterality: N/A;   REDUCTION MAMMAPLASTY Bilateral    TEE WITHOUT CARDIOVERSION N/A 05/28/2019   Procedure: TRANSESOPHAGEAL ECHOCARDIOGRAM (TEE);  Surgeon: Delight Ovens, MD;  Location: [facility];  Service: Open Heart Surgery;  Laterality: N/A;   WISDOM TOOTH EXTRACTION      Family History  Problem Relation Age of Onset   Cancer Sister        melanoma- with mets   Colon cancer Father 64   Stomach cancer Maternal Grandfather    Breast cancer Mother 58   Breast cancer Daughter 24   Rectal cancer Neg Hx    Esophageal cancer Neg Hx     Allergies  Allergen Reactions   Penicillins Shortness Of Breath    red face Did it involve swelling of the face/tongue/throat, SOB, or low BP? Yes Did it involve sudden or severe rash/hives, skin peeling, or any reaction on the inside of your mouth or nose? Yes Did you need to seek medical attention at a hospital or doctor's office? Yes When did it last happen?      40+ years If all  above answers are "NO", may proceed with cephalosporin use.    Coreg [Carvedilol] Rash   Amlodipine     Pt states it made her feel "awful and weird."   Metoprolol Other (See Comments)    " feel weird"   Betadine [Povidone Iodine] Rash   Hydrochlorothiazide Other (See Comments)    Fatigue    Hydrocodone-Acetaminophen Rash  ras   Lisinopril     Fatigue    Tramadol Itching and Rash    Current Outpatient Medications on File Prior to Visit  Medication Sig Dispense Refill   Ascorbic Acid (VITAMIN C) 1000 MG tablet Take 1,000 mg by mouth daily.     aspirin 81 MG tablet Take 1 tablet (81 mg total) by mouth daily.     atenolol (TENORMIN) 50 MG tablet Take 1 tablet (50 mg total) by mouth daily. 90 tablet 0   atorvastatin (LIPITOR) 40 MG tablet TAKE 1 TABLET DAILY 90 tablet 3   buPROPion (WELLBUTRIN XL) 150 MG 24 hr tablet TAKE 1 TABLET DAILY 90 tablet 3   Cholecalciferol (VITAMIN D) 2000 UNITS CAPS Take by mouth.     fluocinonide ointment (LIDEX) 0.05 % Apply 1 application topically daily as needed (itching). 60 g 1   ibuprofen (ADVIL) 200 MG tablet Take 400 mg by mouth in the morning and at bedtime.     levothyroxine (SYNTHROID) 112 MCG tablet TAKE 1 TABLET DAILY 90 tablet 3   losartan (COZAAR) 100 MG tablet TAKE 1 TABLET DAILY 90 tablet 3   No current facility-administered medications on file prior to visit.    BP 128/80   Pulse 60   Temp 98.2 F (36.8 C) (Oral)   Ht 5\' 1"  (1.549 m)   Wt 204 lb (92.5 kg)   SpO2 97%   BMI 38.55 kg/m       Objective:   Physical Exam Vitals and nursing note reviewed.  Constitutional:      Appearance: Normal appearance.  Cardiovascular:     Rate and Rhythm: Normal rate and regular rhythm.     Pulses: Normal pulses.     Heart sounds: Normal heart sounds.  Pulmonary:     Effort: Pulmonary effort is normal.     Breath sounds: Normal breath sounds.  Musculoskeletal:        General: Normal range of motion.  Skin:    General: Skin is warm  and dry.  Neurological:     General: No focal deficit present.     Mental Status: She is alert and oriented to person, place, and time.  Psychiatric:        Mood and Affect: Mood normal.        Behavior: Behavior normal.        Thought Content: Thought content normal.        Judgment: Judgment normal.        Assessment & Plan:  1. Essential hypertension - BP well controlled.  - No change in medications  - Basic Metabolic Panel; Future  2. Hyperkalemia  - Basic Metabolic Panel; Future  Shirline Frees, NP

## 2022-12-23 ENCOUNTER — Encounter: Payer: Self-pay | Admitting: Adult Health

## 2022-12-24 ENCOUNTER — Other Ambulatory Visit: Payer: Self-pay | Admitting: Adult Health

## 2022-12-24 DIAGNOSIS — E669 Obesity, unspecified: Secondary | ICD-10-CM

## 2023-01-10 ENCOUNTER — Encounter: Payer: Self-pay | Admitting: Adult Health

## 2023-01-10 ENCOUNTER — Ambulatory Visit (INDEPENDENT_AMBULATORY_CARE_PROVIDER_SITE_OTHER): Payer: Medicare Other

## 2023-01-10 VITALS — Ht 61.0 in | Wt 204.0 lb

## 2023-01-10 DIAGNOSIS — Z0000 Encounter for general adult medical examination without abnormal findings: Secondary | ICD-10-CM | POA: Diagnosis not present

## 2023-01-10 NOTE — Patient Instructions (Addendum)
Hannah Morrow , Thank you for taking time to come for your Medicare Wellness Visit. I appreciate your ongoing commitment to your health goals. Please review the following plan we discussed and let me know if I can assist you in the future.   These are the goals we discussed:  Goals       Patient Stated      To develop helpful exercises for balance and other  May try to get back into yoga!      Patient stated (pt-stated)      I would like to get back to my normal. And lose some weight.        This is a list of the screening recommended for you and due dates:  Health Maintenance  Topic Date Due   COVID-19 Vaccine (5 - 2023-24 season) 01/26/2023*   Zoster (Shingles) Vaccine (1 of 2) 04/12/2023*   Flu Shot  02/03/2023   Mammogram  04/08/2023   Medicare Annual Wellness Visit  01/10/2024   DTaP/Tdap/Td vaccine (3 - Tdap) 03/29/2032   Pneumonia Vaccine  Completed   DEXA scan (bone density measurement)  Completed   HPV Vaccine  Aged Out   Colon Cancer Screening  Discontinued   Hepatitis C Screening  Discontinued  *Topic was postponed. The date shown is not the original due date.    Advanced directives: Please bring a copy of your health care power of attorney and living will to the office to be added to your chart at your convenience.   Conditions/risks identified: None  Next appointment: Follow up in one year for your annual wellness visit    Preventive Care 65 Years and Older, Female Preventive care refers to lifestyle choices and visits with your health care provider that can promote health and wellness. What does preventive care include? A yearly physical exam. This is also called an annual well check. Dental exams once or twice a year. Routine eye exams. Ask your health care provider how often you should have your eyes checked. Personal lifestyle choices, including: Daily care of your teeth and gums. Regular physical activity. Eating a healthy diet. Avoiding tobacco and  drug use. Limiting alcohol use. Practicing safe sex. Taking low-dose aspirin every day. Taking vitamin and mineral supplements as recommended by your health care provider. What happens during an annual well check? The services and screenings done by your health care provider during your annual well check will depend on your age, overall health, lifestyle risk factors, and family history of disease. Counseling  Your health care provider may ask you questions about your: Alcohol use. Tobacco use. Drug use. Emotional well-being. Home and relationship well-being. Sexual activity. Eating habits. History of falls. Memory and ability to understand (cognition). Work and work Astronomer. Reproductive health. Screening  You may have the following tests or measurements: Height, weight, and BMI. Blood pressure. Lipid and cholesterol levels. These may be checked every 5 years, or more frequently if you are over 53 years old. Skin check. Lung cancer screening. You may have this screening every year starting at age 61 if you have a 30-pack-year history of smoking and currently smoke or have quit within the past 15 years. Fecal occult blood test (FOBT) of the stool. You may have this test every year starting at age 35. Flexible sigmoidoscopy or colonoscopy. You may have a sigmoidoscopy every 5 years or a colonoscopy every 10 years starting at age 51. Hepatitis C blood test. Hepatitis B blood test. Sexually transmitted disease (STD) testing.  Diabetes screening. This is done by checking your blood sugar (glucose) after you have not eaten for a while (fasting). You may have this done every 1-3 years. Bone density scan. This is done to screen for osteoporosis. You may have this done starting at age 5. Mammogram. This may be done every 1-2 years. Talk to your health care provider about how often you should have regular mammograms. Talk with your health care provider about your test results, treatment  options, and if necessary, the need for more tests. Vaccines  Your health care provider may recommend certain vaccines, such as: Influenza vaccine. This is recommended every year. Tetanus, diphtheria, and acellular pertussis (Tdap, Td) vaccine. You may need a Td booster every 10 years. Zoster vaccine. You may need this after age 59. Pneumococcal 13-valent conjugate (PCV13) vaccine. One dose is recommended after age 74. Pneumococcal polysaccharide (PPSV23) vaccine. One dose is recommended after age 64. Talk to your health care provider about which screenings and vaccines you need and how often you need them. This information is not intended to replace advice given to you by your health care provider. Make sure you discuss any questions you have with your health care provider. Document Released: 07/18/2015 Document Revised: 03/10/2016 Document Reviewed: 04/22/2015 Elsevier Interactive Patient Education  2017 [employer].  Fall Prevention in the Home Falls can cause injuries. They can happen to people of all ages. There are many things you can do to make your home safe and to help prevent falls. What can I do on the outside of my home? Regularly fix the edges of walkways and driveways and fix any cracks. Remove anything that might make you trip as you walk through a door, such as a raised step or threshold. Trim any bushes or trees on the path to your home. Use bright outdoor lighting. Clear any walking paths of anything that might make someone trip, such as rocks or tools. Regularly check to see if handrails are loose or broken. Make sure that both sides of any steps have handrails. Any raised decks and porches should have guardrails on the edges. Have any leaves, snow, or ice cleared regularly. Use sand or salt on walking paths during winter. Clean up any spills in your garage right away. This includes oil or grease spills. What can I do in the bathroom? Use night lights. Install grab  bars by the toilet and in the tub and shower. Do not use towel bars as grab bars. Use non-skid mats or decals in the tub or shower. If you need to sit down in the shower, use a plastic, non-slip stool. Keep the floor dry. Clean up any water that spills on the floor as soon as it happens. Remove soap buildup in the tub or shower regularly. Attach bath mats securely with double-sided non-slip rug tape. Do not have throw rugs and other things on the floor that can make you trip. What can I do in the bedroom? Use night lights. Make sure that you have a light by your bed that is easy to reach. Do not use any sheets or blankets that are too big for your bed. They should not hang down onto the floor. Have a firm chair that has side arms. You can use this for support while you get dressed. Do not have throw rugs and other things on the floor that can make you trip. What can I do in the kitchen? Clean up any spills right away. Avoid walking on wet floors.  Keep items that you use a lot in easy-to-reach places. If you need to reach something above you, use a strong step stool that has a grab bar. Keep electrical cords out of the way. Do not use floor polish or wax that makes floors slippery. If you must use wax, use non-skid floor wax. Do not have throw rugs and other things on the floor that can make you trip. What can I do with my stairs? Do not leave any items on the stairs. Make sure that there are handrails on both sides of the stairs and use them. Fix handrails that are broken or loose. Make sure that handrails are as long as the stairways. Check any carpeting to make sure that it is firmly attached to the stairs. Fix any carpet that is loose or worn. Avoid having throw rugs at the top or bottom of the stairs. If you do have throw rugs, attach them to the floor with carpet tape. Make sure that you have a light switch at the top of the stairs and the bottom of the stairs. If you do not have them,  ask someone to add them for you. What else can I do to help prevent falls? Wear shoes that: Do not have high heels. Have rubber bottoms. Are comfortable and fit you well. Are closed at the toe. Do not wear sandals. If you use a stepladder: Make sure that it is fully opened. Do not climb a closed stepladder. Make sure that both sides of the stepladder are locked into place. Ask someone to hold it for you, if possible. Clearly mark and make sure that you can see: Any grab bars or handrails. First and last steps. Where the edge of each step is. Use tools that help you move around (mobility aids) if they are needed. These include: Canes. Walkers. Scooters. Crutches. Turn on the lights when you go into a dark area. Replace any light bulbs as soon as they burn out. Set up your furniture so you have a clear path. Avoid moving your furniture around. If any of your floors are uneven, fix them. If there are any pets around you, be aware of where they are. Review your medicines with your doctor. Some medicines can make you feel dizzy. This can increase your chance of falling. Ask your doctor what other things that you can do to help prevent falls. This information is not intended to replace advice given to you by your health care provider. Make sure you discuss any questions you have with your health care provider. Document Released: 04/17/2009 Document Revised: 11/27/2015 Document Reviewed: 07/26/2014 Elsevier Interactive Patient Education  2017 [employer].

## 2023-01-10 NOTE — Progress Notes (Signed)
Subjective:   Hannah Morrow is a 82 y.o. female who presents for Medicare Annual (Subsequent) preventive examination.  Visit Complete: Virtual  I connected with  Hannah Morrow on 01/10/23 by a audio enabled telemedicine application and verified that I am speaking with the correct person using two identifiers.  Patient Location: Home  Provider Location: Home Office  I discussed the limitations of evaluation and management by telemedicine. The patient expressed understanding and agreed to proceed.  Patient Medicare AWV questionnaire was completed by the patient on ; I have confirmed that all information answered by patient is correct and no changes since this date.  Review of Systems      Cardiac Risk Factors include: advanced age (>68men, >31 women);dyslipidemia;hypertension     Objective:    Today's Vitals   01/10/23 1047  Weight: 204 lb (92.5 kg)  Height: 5\' 1"  (1.549 m)   Body mass index is 38.55 kg/m.     01/10/2023   10:55 AM 09/29/2022    2:03 PM 05/11/2022    7:28 PM 12/30/2021   11:35 AM 12/25/2020    1:16 PM 12/18/2020   11:31 AM 05/25/2019   10:21 AM  Advanced Directives  Does Patient Have a Medical Advance Directive? Yes No Yes Yes Yes Yes Yes  Type of Estate agent of [place];Living will  Living will Healthcare Power of [place];Living will Healthcare Power of [place];Living will Healthcare Power of [place];Living will Healthcare Power of [place];Living will  Does patient want to make changes to medical advance directive?   No - Patient declined No - Patient declined  No - Patient declined No - Patient declined  Copy of Healthcare Power of Attorney in Chart? No - copy requested   No - copy requested No - copy requested No - copy requested No - copy requested  Would patient like information on creating a medical advance directive?  No - Patient declined         Current Medications (verified) Outpatient Encounter  Medications as of 01/10/2023  Medication Sig   Ascorbic Acid (VITAMIN C) 1000 MG tablet Take 1,000 mg by mouth daily.   aspirin 81 MG tablet Take 1 tablet (81 mg total) by mouth daily.   atenolol (TENORMIN) 50 MG tablet Take 1 tablet (50 mg total) by mouth daily.   atorvastatin (LIPITOR) 40 MG tablet TAKE 1 TABLET DAILY   buPROPion (WELLBUTRIN XL) 150 MG 24 hr tablet TAKE 1 TABLET DAILY   Cholecalciferol (VITAMIN D) 2000 UNITS CAPS Take by mouth.   fluocinonide ointment (LIDEX) 0.05 % Apply 1 application topically daily as needed (itching).   ibuprofen (ADVIL) 200 MG tablet Take 400 mg by mouth in the morning and at bedtime.   levothyroxine (SYNTHROID) 112 MCG tablet TAKE 1 TABLET DAILY   losartan (COZAAR) 100 MG tablet TAKE 1 TABLET DAILY   No facility-administered encounter medications on file as of 01/10/2023.    Allergies (verified) Penicillins, Coreg [carvedilol], Amlodipine, Metoprolol, Betadine [povidone iodine], Hydrochlorothiazide, Hydrocodone-acetaminophen, Lisinopril, and Tramadol   History: Past Medical History:  Diagnosis Date   Allergy    no meds   ANXIETY    Cancer (HCC)    cervical cancer - hysterectomy   GERD    Hyperlipidemia    Hypertension    Hypothyroid    Plantar fasciitis    right   RESTLESS LEGS SYNDROME    Past Surgical History:  Procedure Laterality Date   ABDOMINAL HYSTERECTOMY     BREAST SURGERY  Bilateral breast reduction   COLOSTOMY  04-22-2010   [facility]   cone bx     cervix   CORONARY ARTERY BYPASS GRAFT N/A 05/28/2019   Procedure: CORONARY ARTERY BYPASS GRAFTING (CABG) using the LIMA to LAD; Endoscopic right saphenous vein harvesting to the Ramus and Diag1.;  Surgeon: Delight Ovens, MD;  Location: [facility] OR;  Service: Open Heart Surgery;  Laterality: N/A;   LEFT HEART CATH AND CORONARY ANGIOGRAPHY N/A 05/25/2019   Procedure: LEFT HEART CATH AND CORONARY ANGIOGRAPHY;  Surgeon: Marykay Lex, MD;  Location: [facility] INVASIVE CV LAB;  Service:  Cardiovascular;  Laterality: N/A;   REDUCTION MAMMAPLASTY Bilateral    TEE WITHOUT CARDIOVERSION N/A 05/28/2019   Procedure: TRANSESOPHAGEAL ECHOCARDIOGRAM (TEE);  Surgeon: Delight Ovens, MD;  Location: [facility] OR;  Service: Open Heart Surgery;  Laterality: N/A;   WISDOM TOOTH EXTRACTION     Family History  Problem Relation Age of Onset   Cancer Sister        melanoma- with mets   Colon cancer Father 54   Stomach cancer Maternal Grandfather    Breast cancer Mother 46   Breast cancer Daughter 5   Rectal cancer Neg Hx    Esophageal cancer Neg Hx    Social History   Socioeconomic History   Marital status: Widowed    Spouse name: Not on file   Number of children: 2   Years of education: Not on file   Highest education level: Some college, no degree  Occupational History   Not on file  Tobacco Use   Smoking status: Never   Smokeless tobacco: Never  Vaping Use   Vaping Use: Not on file  Substance and Sexual Activity   Alcohol use: Yes    Alcohol/week: 5.0 standard drinks of alcohol    Types: 5 Glasses of wine per week    Comment: Occasional   Drug use: No   Sexual activity: Yes    Partners: Male    Birth control/protection: Surgical, Other-see comments    Comment: hysterectomy  Other Topics Concern   Not on file  Social History Narrative   Married for 35 years   Two daughters, five grand children and 5 great grand children. Daughter lives in [place]. Other daughter lives in [place].    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    Exercise Does not exercise. Was going to exercise class but has since quite.    Diet: Veggies, chicken, salmon. Very little red meat. Eats lots of eggs.    Exercise --- walking , gym 3 classes a week   Social Determinants of Health   Financial Resource Strain: Low Risk  (01/10/2023)   Overall Financial Resource Strain (CARDIA)    Difficulty of Paying Living Expenses: Not hard at all  Food  Insecurity: No Food Insecurity (01/10/2023)   Hunger Vital Sign    Worried About Running Out of Food in the Last Year: Never true    Ran Out of Food in the Last Year: Never true  Transportation Needs: No Transportation Needs (01/10/2023)   PRAPARE - Administrator, Civil Service (Medical): No    Lack of Transportation (Non-Medical): No  Physical Activity: Insufficiently Active (01/10/2023)   Exercise Vital Sign    Days of Exercise per Week: 7 days    Minutes of Exercise per Session: 20 min  Stress: No Stress Concern Present (01/10/2023)  [employer] of Occupational Health - Occupational Stress Questionnaire    Feeling of Stress : Not at all  Social Connections: Moderately Integrated (01/10/2023)   Social Connection and Isolation Panel [NHANES]    Frequency of Communication with Friends and Family: More than three times a week    Frequency of Social Gatherings with Friends and Family: More than three times a week    Attends Religious Services: More than 4 times per year    Active Member of [employer] or Organizations: Yes    Attends Banker Meetings: More than 4 times per year    Marital Status: Widowed    Tobacco Counseling Counseling given: Not Answered   Clinical Intake:  Pre-visit preparation completed: No  Pain : No/denies pain     BMI - recorded: 38.55 Nutritional Status: BMI > 30  Obese Nutritional Risks: None Diabetes: No  How often do you need to have someone help you when you read instructions, pamphlets, or other written materials from your doctor or pharmacy?: 1 - Never  Interpreter Needed?: No  Information entered by :: Theresa Mulligan LPN   Activities of Daily Living    01/10/2023   10:53 AM  In your present state of health, do you have any difficulty performing the following activities:  Hearing? 0  Vision? 0  Difficulty concentrating or making decisions? 0  Walking or climbing stairs? 0  Dressing or bathing? 0  Doing errands,  shopping? 0  Preparing Food and eating ? N  Using the Toilet? N  In the past six months, have you accidently leaked urine? N  Do you have problems with loss of bowel control? N  Managing your Medications? N  Managing your Finances? N  Housekeeping or managing your Housekeeping? N    Patient Care Team: Shirline Frees, NP as PCP - General (Family Medicine) Aris Lot, MD as Consulting Physician (Dermatology) Runell Gess, MD as Consulting Physician (Cardiology)  Indicate any recent Medical Services you may have received from other than [facility] providers in the past year (date may be approximate).     Assessment:   This is a routine wellness examination for Hannah Morrow.  Hearing/Vision screen Hearing Screening - Comments:: Denies hearing difficulties   Vision Screening - Comments:: Wears rx glasses - up to date with routine eye exams with  Dr Randon Goldsmith  Dietary issues and exercise activities discussed:     Goals Addressed               This Visit's Progress     Patient stated (pt-stated)        I would like to get back to my normal. And lose some weight.       Depression Screen    01/10/2023   10:51 AM 10/07/2022   10:48 AM 04/09/2022   11:26 AM 02/12/2022   10:52 AM 12/30/2021   11:28 AM 12/25/2020    1:17 PM 12/25/2020    1:13 PM  PHQ 2/9 Scores  PHQ - 2 Score 0 2 0 1 1 0 0  PHQ- 9 Score  9 1        Fall Risk    01/10/2023   10:54 AM 10/03/2022    8:46 AM 04/09/2022   11:26 AM 12/30/2021   11:33 AM 12/25/2020    1:17 PM  Fall Risk   Falls in the past year? 0 0 0 0 0  Number falls in past yr: 0  0 0 0  Injury with Fall? 0  0 0 0  Risk for fall due to : No Fall Risks  No Fall Risks No Fall Risks   Follow up Falls prevention discussed  Falls evaluation completed  Falls evaluation completed    MEDICARE RISK AT HOME:  Medicare Risk at Home - 01/10/23 1058     Any stairs in or around the home? Yes    If so, are there any without handrails? No    Home free of  loose throw rugs in walkways, pet beds, electrical cords, etc? Yes    Adequate lighting in your home to reduce risk of falls? Yes    Life alert? No    Use of a cane, walker or w/c? No    Grab bars in the bathroom? Yes    Shower chair or bench in shower? No    Elevated toilet seat or a handicapped toilet? Yes             TIMED UP AND GO:  Was the test performed?  No    Cognitive Function:        01/10/2023   10:55 AM 12/30/2021   11:36 AM  6CIT Screen  What Year? 0 points 0 points  What month? 0 points 0 points  What time? 0 points 0 points  Count back from 20 0 points 0 points  Months in reverse 0 points 0 points  Repeat phrase 0 points 0 points  Total Score 0 points 0 points    Immunizations Immunization History  Administered Date(s) Administered   Fluad Quad(high Dose 65+) 03/29/2022   Influenza, High Dose Seasonal PF 05/19/2015, 05/14/2016, 04/05/2017, 04/18/2018   Influenza,inj,Quad PF,6+ Mos 05/13/2014   Influenza-Unspecified 04/11/2020, 03/29/2021   PFIZER(Purple Top)SARS-COV-2 Vaccination 07/27/2019, 08/14/2019, 03/29/2021   Pfizer Covid-19 Vaccine Bivalent Booster 52yrs & up 04/22/2020   Pneumococcal Conjugate-13 05/13/2014   Pneumococcal Polysaccharide-23 04/23/2013   Td 12/10/2008, 03/29/2022   Zoster, Live 10/06/2012    TDAP status: Up to date  Flu Vaccine status: Up to date  Pneumococcal vaccine status: Up to date  Covid-19 vaccine status: Completed vaccines  Qualifies for Shingles Vaccine? Yes   Zostavax completed No   Shingrix Completed?: No.    Education has been provided regarding the importance of this vaccine. Patient has been advised to call insurance company to determine out of pocket expense if they have not yet received this vaccine. Advised may also receive vaccine at local pharmacy or Health Dept. Verbalized acceptance and understanding.  Screening Tests Health Maintenance  Topic Date Due   COVID-19 Vaccine (5 - 2023-24 season)  01/26/2023 (Originally 03/05/2022)   Zoster Vaccines- Shingrix (1 of 2) 04/12/2023 (Originally 03/23/1992)   INFLUENZA VACCINE  02/03/2023   MAMMOGRAM  04/08/2023   Medicare Annual Wellness (AWV)  01/10/2024   DTaP/Tdap/Td (3 - Tdap) 03/29/2032   Pneumonia Vaccine 84+ Years old  Completed   DEXA SCAN  Completed   HPV VACCINES  Aged Out   Colonoscopy  Discontinued   Hepatitis C Screening  Discontinued    Health Maintenance  There are no preventive care reminders to display for this patient.   Colorectal cancer screening: No longer required.   Mammogram status: Completed 04/07/22. Repeat every year  Bone Density status: Completed 01/14/21. Results reflect: Bone density results: OSTEOPOROSIS. Repeat every   years.  Lung Cancer Screening: (Low Dose CT Chest recommended if Age 26-80 years, 20 pack-year currently smoking OR have quit w/in 15years.) does not qualify.     Additional Screening:  Hepatitis  C Screening: does not qualify; Completed   Vision Screening: Recommended annual ophthalmology exams for early detection of glaucoma and other disorders of the eye. Is the patient up to date with their annual eye exam?  Yes  Who is the provider or what is the name of the office in which the patient attends annual eye exams? Dr Randon Goldsmith If pt is not established with a provider, would they like to be referred to a provider to establish care? No .   Dental Screening: Recommended annual dental exams for proper oral hygiene   Community Resource Referral / Chronic Care Management:  CRR required this visit?  No   CCM required this visit?  No     Plan:     I have personally reviewed and noted the following in the patient's chart:   Medical and social history Use of alcohol, tobacco or illicit drugs  Current medications and supplements including opioid prescriptions. Patient is not currently taking opioid prescriptions. Functional ability and status Nutritional status Physical  activity Advanced directives List of other physicians Hospitalizations, surgeries, and ER visits in previous 12 months Vitals Screenings to include cognitive, depression, and falls Referrals and appointments  In addition, I have reviewed and discussed with patient certain preventive protocols, quality metrics, and best practice recommendations. A written personalized care plan for preventive services as well as general preventive health recommendations were provided to patient.     Tillie Rung, LPN   4/0/9811   After Visit Summary: (MyChart) Due to this being a telephonic visit, the after visit summary with patients personalized plan was offered to patient via MyChart   Nurse Notes:

## 2023-01-13 ENCOUNTER — Telehealth: Payer: Self-pay | Admitting: Adult Health

## 2023-01-13 NOTE — Telephone Encounter (Signed)
Prescription Request  01/13/2023  LOV: 12/07/2022  What is the name of the medication or equipment? buPROPion (WELLBUTRIN XL) 150 MG 24 hr tablet   Have you contacted your pharmacy to request a refill? No   Which pharmacy would you like this sent to [id number] [place], [place] - [address] Phone: [phone]  Fax: [phone]     that their request is being sent to the clinical staff for review and that they should receive a response within 2 business days.  Pt stated [facility] don't have it. Please advise at Mobile [phone] (mobile)

## 2023-01-15 ENCOUNTER — Encounter: Payer: Self-pay | Admitting: Adult Health

## 2023-01-15 DIAGNOSIS — F320 Major depressive disorder, single episode, mild: Secondary | ICD-10-CM

## 2023-01-18 NOTE — Telephone Encounter (Signed)
Pt notified of message below and would like to d/c medication medication has been d/c.

## 2023-01-18 NOTE — Addendum Note (Signed)
Addended by: Waymon Amato R on: 01/18/2023 04:12 PM   Modules accepted: Orders

## 2023-01-31 ENCOUNTER — Other Ambulatory Visit: Payer: Self-pay | Admitting: Adult Health

## 2023-02-17 ENCOUNTER — Ambulatory Visit (INDEPENDENT_AMBULATORY_CARE_PROVIDER_SITE_OTHER): Payer: Medicare Other | Admitting: Physician Assistant

## 2023-02-17 ENCOUNTER — Encounter (INDEPENDENT_AMBULATORY_CARE_PROVIDER_SITE_OTHER): Payer: Self-pay | Admitting: Physician Assistant

## 2023-02-17 VITALS — BP 145/80 | HR 60 | Temp 97.7°F | Ht 61.0 in | Wt 200.0 lb

## 2023-02-17 DIAGNOSIS — I6522 Occlusion and stenosis of left carotid artery: Secondary | ICD-10-CM

## 2023-02-17 DIAGNOSIS — Z951 Presence of aortocoronary bypass graft: Secondary | ICD-10-CM

## 2023-02-17 DIAGNOSIS — E782 Mixed hyperlipidemia: Secondary | ICD-10-CM | POA: Diagnosis not present

## 2023-02-17 DIAGNOSIS — Z6837 Body mass index (BMI) 37.0-37.9, adult: Secondary | ICD-10-CM

## 2023-02-17 DIAGNOSIS — I10 Essential (primary) hypertension: Secondary | ICD-10-CM

## 2023-02-17 DIAGNOSIS — E039 Hypothyroidism, unspecified: Secondary | ICD-10-CM | POA: Diagnosis not present

## 2023-02-17 DIAGNOSIS — R7301 Impaired fasting glucose: Secondary | ICD-10-CM | POA: Diagnosis not present

## 2023-02-17 NOTE — Progress Notes (Signed)
Office: [phone]  /  Fax: [phone]   Initial Visit  Hannah Morrow was seen in clinic today to evaluate for obesity. She is interested in losing weight to improve overall health and reduce the risk of weight related complications. She presents today to review program treatment options, initial physical assessment, and evaluation.     She was referred by: PCP  When asked what else they would like to accomplish? She states: Adopt healthier eating patterns, Improve energy levels and physical activity, Improve existing medical conditions, Reduce number of medications, Improve quality of life, and Improve appearance  Weight history: Struggled with weight for past 30-40 years. Lost husband 2 years ago and stopped cooking at home as much.  Lost weight on Atkins in past but regained.  When asked how has your weight affected you? She states: Has affected self-esteem, Contributed to medical problems, Contributed to orthopedic problems or mobility issues, Having fatigue, Having poor endurance, Problems with eating patterns, and Has affected mood   Some associated conditions: Hypertension, Hyperlipidemia, Overactive bladder, Heart disease, and Vitamin D Deficiency  Contributing factors: Nutritional, Stress, Reduced physical activity, and Eating patterns  Weight promoting medications identified: Beta-blockers  Current nutrition plan: None  Current level of physical activity: Limited due to chronic pain or orthopedic problems  Current or previous pharmacotherapy: None  Response to medication: Never tried medications   Past medical history includes:   Past Medical History:  Diagnosis Date   Allergy    no meds   ANXIETY    Cancer (HCC)    cervical cancer - hysterectomy   GERD    Hyperlipidemia    Hypertension    Hypothyroid    Plantar fasciitis    right   RESTLESS LEGS SYNDROME      Objective:   BP (!) 145/80   Pulse 60   Temp 97.7 F (36.5 C)   Ht 5\' 1"   (1.549 m)   Wt 200 lb (90.7 kg)   SpO2 97%   BMI 37.79 kg/m  She was weighed on the bioimpedance scale: Body mass index is 37.79 kg/m.  Peak Weight:205 lbs , Body Fat%:50.4%, Visceral Fat Rating:18, Weight trend over the last 12 months: Unchanged  General:  Alert, oriented and cooperative. Patient is in no acute distress.  Respiratory: Normal respiratory effort, no problems with respiration noted   Gait: able to ambulate independently  Mental Status: Normal mood and affect. Normal behavior. Normal judgment and thought content.   DIAGNOSTIC DATA REVIEWED:  BMET    Component Value Date/Time   NA 138 12/07/2022 1202   NA 141 08/14/2020 1135   K 5.0 12/07/2022 1202   CL 103 12/07/2022 1202   CO2 27 12/07/2022 1202   GLUCOSE 102 (H) 12/07/2022 1202   GLUCOSE 109 (H) 06/17/2006 0936   BUN 30 (H) 12/07/2022 1202   BUN 29 (H) 08/14/2020 1135   CREATININE 1.58 (H) 12/07/2022 1202   CALCIUM 9.9 12/07/2022 1202   GFRNONAA 47 (L) 09/29/2022 1434   GFRAA 47 (L) 08/14/2020 1135   Lab Results  Component Value Date   HGBA1C 5.7 04/09/2022   HGBA1C 5.6 09/05/2006   No results found for: "INSULIN" CBC    Component Value Date/Time   WBC 7.4 09/29/2022 1434   RBC 5.54 (H) 09/29/2022 1434   HGB 14.3 09/29/2022 1434   HGB 13.0 10/02/2019 1409   HCT 46.8 (H) 09/29/2022 1434   HCT 41.4 10/02/2019 1409   PLT 234 09/29/2022 1434   PLT 248  10/02/2019 1409   MCV 84.5 09/29/2022 1434   MCV 77 (L) 10/02/2019 1409   MCH 25.8 (L) 09/29/2022 1434   MCHC 30.6 09/29/2022 1434   RDW 13.4 09/29/2022 1434   RDW 13.7 10/02/2019 1409   Iron/TIBC/Ferritin/ %Sat    Component Value Date/Time   IRON 69 12/18/2019 0952   FERRITIN 18.9 12/18/2019 0952   IRONPCTSAT 14.4 (L) 12/18/2019 0952   Lipid Panel     Component Value Date/Time   CHOL 152 04/09/2022 1153   CHOL 160 05/08/2021 0934   TRIG 178.0 (H) 04/09/2022 1153   TRIG 169 (H) 06/17/2006 0936   HDL 50.40 04/09/2022 1153   HDL 53  05/08/2021 0934   CHOLHDL 3 04/09/2022 1153   VLDL 35.6 04/09/2022 1153   LDLCALC 66 04/09/2022 1153   LDLCALC 71 05/08/2021 0934   LDLDIRECT 99.0 02/03/2021 0930   Hepatic Function Panel     Component Value Date/Time   PROT 7.8 04/09/2022 1153   PROT 7.0 05/08/2021 0935   ALBUMIN 4.6 04/09/2022 1153   ALBUMIN 4.7 05/08/2021 0935   AST 55 (H) 04/09/2022 1153   ALT 83 (H) 04/09/2022 1153   ALKPHOS 58 04/09/2022 1153   BILITOT 1.2 04/09/2022 1153   BILITOT 0.8 05/08/2021 0935   BILIDIR 0.19 05/08/2021 0935   IBILI 0.6 01/09/2010 2121      Component Value Date/Time   TSH 4.42 04/09/2022 1153     Assessment and Plan:   Essential hypertension  Mixed hyperlipidemia  S/P CABG x 3  Stenosis of left carotid artery  Elevated fasting glucose  Hypothyroidism, unspecified type  Class 2 severe obesity due to excess calories with serious comorbidity and body mass index (BMI) of 37.0 to 37.9 in adult [facility])        Obesity Treatment / Action Plan:  Patient will work on garnering support from family and friends to begin weight loss journey. Will work on eliminating or reducing the presence of highly palatable, calorie dense foods in the home. Will complete provided nutritional and psychosocial assessment questionnaire before the next appointment. Will be scheduled for indirect calorimetry to determine resting energy expenditure in a fasting state.  This will allow [place] to create a reduced calorie, high-protein meal plan to promote loss of fat mass while preserving muscle mass. Will think about ideas on how to incorporate physical activity into their daily routine. Will work on reducing intake of added sugars, simple sugars and processed carbs. Will avoid skipping meals which may result in increased hunger signals and overeating at certain times. Counseled on the health benefits of losing 5%-15% of total body weight. Was counseled on nutritional approaches to weight loss and  benefits of reducing processed foods and consuming plant-based foods and high quality protein as part of nutritional weight management. Was counseled on pharmacotherapy and role as an adjunct in weight management.   Obesity Education Performed Today:  She was weighed on the bioimpedance scale and results were discussed and documented in the synopsis.  We discussed obesity as a disease and the importance of a more detailed evaluation of all the factors contributing to the disease.  We discussed the importance of long term lifestyle changes which include nutrition, exercise and behavioral modifications as well as the importance of customizing this to her specific health and social needs.  We discussed the benefits of reaching a healthier weight to alleviate the symptoms of existing conditions and reduce the risks of the biomechanical, metabolic and psychological effects of obesity.  Andriette Buchmann appears to be in the action stage of change and states they are ready to start intensive lifestyle modifications and behavioral modifications.  30 minutes was spent today on this visit including the above counseling, pre-visit chart review, and post-visit documentation.  Reviewed by clinician on day of visit: allergies, medications, problem list, medical history, surgical history, family history, social history, and previous encounter notes pertinent to obesity diagnosis.   Nela Bascom,PA-C

## 2023-03-04 ENCOUNTER — Encounter: Payer: Self-pay | Admitting: Adult Health

## 2023-03-04 ENCOUNTER — Other Ambulatory Visit: Payer: Self-pay | Admitting: Adult Health

## 2023-03-04 DIAGNOSIS — Z23 Encounter for immunization: Secondary | ICD-10-CM | POA: Diagnosis not present

## 2023-03-08 ENCOUNTER — Other Ambulatory Visit: Payer: Self-pay | Admitting: Adult Health

## 2023-03-08 DIAGNOSIS — Z1231 Encounter for screening mammogram for malignant neoplasm of breast: Secondary | ICD-10-CM

## 2023-03-18 DIAGNOSIS — L821 Other seborrheic keratosis: Secondary | ICD-10-CM | POA: Diagnosis not present

## 2023-03-18 DIAGNOSIS — Z85828 Personal history of other malignant neoplasm of skin: Secondary | ICD-10-CM | POA: Diagnosis not present

## 2023-03-18 DIAGNOSIS — D692 Other nonthrombocytopenic purpura: Secondary | ICD-10-CM | POA: Diagnosis not present

## 2023-03-18 DIAGNOSIS — L814 Other melanin hyperpigmentation: Secondary | ICD-10-CM | POA: Diagnosis not present

## 2023-04-11 ENCOUNTER — Ambulatory Visit
Admission: RE | Admit: 2023-04-11 | Discharge: 2023-04-11 | Disposition: A | Discharge status: Home / Self Care | Payer: Medicare Other | Source: Ambulatory Visit

## 2023-04-11 DIAGNOSIS — Z1231 Encounter for screening mammogram for malignant neoplasm of breast: Secondary | ICD-10-CM

## 2023-04-19 ENCOUNTER — Encounter: Payer: Self-pay | Admitting: Adult Health

## 2023-04-19 ENCOUNTER — Ambulatory Visit (INDEPENDENT_AMBULATORY_CARE_PROVIDER_SITE_OTHER): Payer: Medicare Other | Admitting: Adult Health

## 2023-04-19 VITALS — BP 130/80 | HR 57 | Temp 97.7°F | Ht 61.0 in | Wt 194.0 lb

## 2023-04-19 DIAGNOSIS — J0100 Acute maxillary sinusitis, unspecified: Secondary | ICD-10-CM | POA: Diagnosis not present

## 2023-04-19 DIAGNOSIS — R6889 Other general symptoms and signs: Secondary | ICD-10-CM

## 2023-04-19 LAB — POC COVID19 BINAXNOW: SARS Coronavirus 2 Ag: NEGATIVE

## 2023-04-19 LAB — POCT INFLUENZA A/B
Influenza A, POC: NEGATIVE
Influenza B, POC: NEGATIVE

## 2023-04-19 MED ORDER — DOXYCYCLINE HYCLATE 100 MG PO CAPS: 100.0000 mg | ORAL_CAPSULE | Freq: Two times a day (BID) | ORAL | 0 refills | Status: DC

## 2023-04-19 NOTE — Progress Notes (Signed)
Subjective:    Patient ID: Hannah Morrow, female    DOB: May 09, 1941, 82 y.o.   MRN: [id number]  Sinus Problem This is a new problem. The current episode started in the past 7 days. The problem has been gradually improving since onset. The maximum temperature recorded prior to her arrival was 100.4 - 100.9 F. The fever has been present for 1 to 2 days. Associated symptoms include chills (resolved), congestion (improving), coughing (improving) and sinus pressure (improving). Pertinent negatives include no diaphoresis, ear pain, headaches, hoarse voice, neck pain, shortness of breath or sore throat. Past treatments include acetaminophen. The treatment provided mild relief.      Review of Systems  Constitutional:  Positive for chills (resolved). Negative for diaphoresis.  HENT:  Positive for congestion (improving) and sinus pressure (improving). Negative for ear pain, hoarse voice and sore throat.   Respiratory:  Positive for cough (improving). Negative for shortness of breath.   Musculoskeletal:  Negative for neck pain.  Neurological:  Negative for headaches.    Past Medical History:  Diagnosis Date   Allergy    no meds   ANXIETY    Cancer (HCC)    cervical cancer - hysterectomy   GERD    Hyperlipidemia    Hypertension    Hypothyroid    Plantar fasciitis    right   RESTLESS LEGS SYNDROME     Social History   Socioeconomic History   Marital status: Widowed    Spouse name: Not on file   Number of children: 2   Years of education: Not on file   Highest education level: Some college, no degree  Occupational History   Not on file  Tobacco Use   Smoking status: Never   Smokeless tobacco: Never  Vaping Use   Vaping status: Not on file  Substance and Sexual Activity   Alcohol use: Yes    Alcohol/week: 5.0 standard drinks of alcohol    Types: 5 Glasses of wine per week    Comment: Occasional   Drug use: No   Sexual activity: Yes    Partners: Male    Birth  control/protection: Surgical, Other-see comments    Comment: hysterectomy  Other Topics Concern   Not on file  Social History Narrative   Married for 35 years   Two daughters, five grand children and 5 great grand children. Daughter lives in [place]. Other daughter lives in [place].    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    Exercise Does not exercise. Was going to exercise class but has since quite.    Diet: Veggies, chicken, salmon. Very little red meat. Eats lots of eggs.    Exercise --- walking , gym 3 classes a week   Social Determinants of Health   Financial Resource Strain: Low Risk  (04/18/2023)   Overall Financial Resource Strain (CARDIA)    Difficulty of Paying Living Expenses: Not hard at all  Food Insecurity: No Food Insecurity (04/18/2023)   Hunger Vital Sign    Worried About Running Out of Food in the Last Year: Never true    Ran Out of Food in the Last Year: Never true  Transportation Needs: No Transportation Needs (04/18/2023)   PRAPARE - Administrator, Civil Service (Medical): No    Lack of Transportation (Non-Medical): No  Physical Activity: Insufficiently Active (04/18/2023)   Exercise Vital Sign    Days of  Exercise per Week: 3 days    Minutes of Exercise per Session: 10 min  Stress: No Stress Concern Present (04/18/2023)   [employer] of Occupational Health - Occupational Stress Questionnaire    Feeling of Stress : Not at all  Social Connections: Moderately Integrated (04/18/2023)   Social Connection and Isolation Panel [NHANES]    Frequency of Communication with Friends and Family: More than three times a week    Frequency of Social Gatherings with Friends and Family: Once a week    Attends Religious Services: More than 4 times per year    Active Member of [employer] or Organizations: Yes    Attends Banker Meetings: More than 4 times per year    Marital Status: Widowed   Intimate Partner Violence: Not At Risk (01/10/2023)   Humiliation, Afraid, Rape, and Kick questionnaire    Fear of Current or Ex-Partner: No    Emotionally Abused: No    Physically Abused: No    Sexually Abused: No    Past Surgical History:  Procedure Laterality Date   ABDOMINAL HYSTERECTOMY     BREAST SURGERY     Bilateral breast reduction   COLOSTOMY  04-22-2010   [facility]   cone bx     cervix   CORONARY ARTERY BYPASS GRAFT N/A 05/28/2019   Procedure: CORONARY ARTERY BYPASS GRAFTING (CABG) using the LIMA to LAD; Endoscopic right saphenous vein harvesting to the Ramus and Diag1.;  Surgeon: Delight Ovens, MD;  Location: [facility] OR;  Service: Open Heart Surgery;  Laterality: N/A;   LEFT HEART CATH AND CORONARY ANGIOGRAPHY N/A 05/25/2019   Procedure: LEFT HEART CATH AND CORONARY ANGIOGRAPHY;  Surgeon: Marykay Lex, MD;  Location: [facility] INVASIVE CV LAB;  Service: Cardiovascular;  Laterality: N/A;   REDUCTION MAMMAPLASTY Bilateral    TEE WITHOUT CARDIOVERSION N/A 05/28/2019   Procedure: TRANSESOPHAGEAL ECHOCARDIOGRAM (TEE);  Surgeon: Delight Ovens, MD;  Location: [facility] OR;  Service: Open Heart Surgery;  Laterality: N/A;   WISDOM TOOTH EXTRACTION      Family History  Problem Relation Age of Onset   Cancer Sister        melanoma- with mets   Colon cancer Father 30   Stomach cancer Maternal Grandfather    Breast cancer Mother 74   Breast cancer Daughter 67   Rectal cancer Neg Hx    Esophageal cancer Neg Hx     Allergies  Allergen Reactions   Penicillins Shortness Of Breath    red face Did it involve swelling of the face/tongue/throat, SOB, or low BP? Yes Did it involve sudden or severe rash/hives, skin peeling, or any reaction on the inside of your mouth or nose? Yes Did you need to seek medical attention at a hospital or doctor's office? Yes When did it last happen?      40+ years If all above answers are "NO", may proceed with cephalosporin use.    Coreg [Carvedilol] Rash    Amlodipine     Pt states it made her feel "awful and weird."   Metoprolol Other (See Comments)    " feel weird"   Betadine [Povidone Iodine] Rash   Hydrochlorothiazide Other (See Comments)    Fatigue    Hydrocodone-Acetaminophen Rash    ras   Lisinopril     Fatigue    Tramadol Itching and Rash    Current Outpatient Medications on File Prior to Visit  Medication Sig Dispense Refill   Ascorbic Acid (VITAMIN C) 1000 MG tablet Take  1,000 mg by mouth daily.     aspirin 81 MG tablet Take 1 tablet (81 mg total) by mouth daily.     atenolol (TENORMIN) 50 MG tablet TAKE 1 TABLET DAILY (TO REPLACE 25 MG DOSE) 90 tablet 3   atorvastatin (LIPITOR) 40 MG tablet TAKE 1 TABLET DAILY 90 tablet 3   Cholecalciferol (VITAMIN D) 2000 UNITS CAPS Take by mouth.     fluocinonide ointment (LIDEX) 0.05 % Apply 1 application topically daily as needed (itching). 60 g 1   ibuprofen (ADVIL) 200 MG tablet Take 400 mg by mouth in the morning and at bedtime.     levothyroxine (SYNTHROID) 112 MCG tablet TAKE 1 TABLET DAILY 90 tablet 3   losartan (COZAAR) 100 MG tablet TAKE 1 TABLET DAILY 90 tablet 3   No current facility-administered medications on file prior to visit.    BP 130/80   Pulse (!) 57   Temp 97.7 F (36.5 C) (Oral)   Ht 5\' 1"  (1.549 m)   Wt 194 lb (88 kg)   SpO2 98%   BMI 36.66 kg/m       Objective:   Physical Exam Vitals and nursing note reviewed.  Constitutional:      Appearance: Normal appearance.  HENT:     Nose: Congestion present. No rhinorrhea.     Right Turbinates: Not enlarged or swollen.     Left Turbinates: Not enlarged or swollen.     Right Sinus: No maxillary sinus tenderness or frontal sinus tenderness.     Left Sinus: No maxillary sinus tenderness or frontal sinus tenderness.  Cardiovascular:     Rate and Rhythm: Normal rate and regular rhythm.     Pulses: Normal pulses.     Heart sounds: Normal heart sounds.  Pulmonary:     Effort: Pulmonary effort is normal.      Breath sounds: Normal breath sounds.  Musculoskeletal:        General: Normal range of motion.  Skin:    General: Skin is warm and dry.  Neurological:     General: No focal deficit present.     Mental Status: She is alert and oriented to person, place, and time.  Psychiatric:        Mood and Affect: Mood normal.        Behavior: Behavior normal.        Thought Content: Thought content normal.        Judgment: Judgment normal.        Assessment & Plan:  1. Flu-like symptoms  - POC COVID-19 BinaxNow- negative - POC Influenza A/B= negative  2. Acute non-recurrent maxillary sinusitis - She is improving which makes me believes this is viral. Will give a prescription for doxycycline incase her symptoms worsen. She knows that if she continues to improve then not to take the antibiotics.  - She did not want any cough medication today  - doxycycline (VIBRAMYCIN) 100 MG capsule; Take 1 capsule (100 mg total) by mouth 2 (two) times daily.  Dispense: 14 capsule; Refill: 0 - Follow up in clinic as needed  Shirline Frees, NP  Time spent with patient today was 31 minutes which consisted of chart review, discussing sinusitis work up, treatment answering questions and documentation.

## 2023-06-09 ENCOUNTER — Other Ambulatory Visit: Payer: Self-pay | Admitting: Adult Health

## 2023-06-21 ENCOUNTER — Ambulatory Visit (INDEPENDENT_AMBULATORY_CARE_PROVIDER_SITE_OTHER): Payer: Medicare Other | Admitting: Adult Health

## 2023-06-21 ENCOUNTER — Encounter: Payer: Self-pay | Admitting: Adult Health

## 2023-06-21 VITALS — BP 140/80 | HR 64 | Temp 97.7°F | Ht 61.0 in | Wt 194.0 lb

## 2023-06-21 DIAGNOSIS — I10 Essential (primary) hypertension: Secondary | ICD-10-CM | POA: Diagnosis not present

## 2023-06-21 MED ORDER — LEVOTHYROXINE SODIUM 112 MCG PO TABS: 112.0000 ug | ORAL_TABLET | Freq: Every day | ORAL | 0 refills | Status: DC

## 2023-06-21 NOTE — Progress Notes (Signed)
Subjective:    Patient ID: Hannah Morrow, female    DOB: Sep 12, 1940, 82 y.o.   MRN: [id number]  HPI  82 year old female who  has a past medical history of Allergy, ANXIETY, Cancer (HCC), GERD, Hyperlipidemia, Hypertension, Hypothyroid, Plantar fasciitis, and RESTLESS LEGS SYNDROME.  She presents to the office today for hypertension. She is currently maintained on Atenolol 50 mg daily ad losartan 100 mg daily.   She had not been checking her blood pressure since October. She checked it on 06/15/2023 and had a reading of 200 systolic. Since that time she has been checking multiple times a day and her readings have been between was 109 - and 170/68-81 with most being in the 120-140 range. She did not have any headaches or blurred vision.    BP Readings from Last 3 Encounters:  06/21/23 (!) 140/80  04/19/23 130/80  02/17/23 (!) 145/80   Wt Readings from Last 3 Encounters:  06/21/23 194 lb (88 kg)  04/19/23 194 lb (88 kg)  02/17/23 200 lb (90.7 kg)   She also has a history of hypothyroidism and needs refill of synthroid   Review of Systems See HPI   Past Medical History:  Diagnosis Date   Allergy    no meds   ANXIETY    Cancer (HCC)    cervical cancer - hysterectomy   GERD    Hyperlipidemia    Hypertension    Hypothyroid    Plantar fasciitis    right   RESTLESS LEGS SYNDROME     Social History   Socioeconomic History   Marital status: Widowed    Spouse name: Not on file   Number of children: 2   Years of education: Not on file   Highest education level: Some college, no degree  Occupational History   Not on file  Tobacco Use   Smoking status: Never   Smokeless tobacco: Never  Vaping Use   Vaping status: Not on file  Substance and Sexual Activity   Alcohol use: Yes    Alcohol/week: 5.0 standard drinks of alcohol    Types: 5 Glasses of wine per week    Comment: Occasional   Drug use: No   Sexual activity: Yes    Partners: Male    Birth  control/protection: Surgical, Other-see comments    Comment: hysterectomy  Other Topics Concern   Not on file  Social History Narrative   Married for 35 years   Two daughters, five grand children and 5 great grand children. Daughter lives in [place]. Other daughter lives in [place].    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    Exercise Does not exercise. Was going to exercise class but has since quite.    Diet: Veggies, chicken, salmon. Very little red meat. Eats lots of eggs.    Exercise --- walking , gym 3 classes a week   Social Drivers of Health   Financial Resource Strain: Low Risk  (04/18/2023)   Overall Financial Resource Strain (CARDIA)    Difficulty of Paying Living Expenses: Not hard at all  Food Insecurity: No Food Insecurity (04/18/2023)   Hunger Vital Sign    Worried About Running Out of Food in the Last Year: Never true    Ran Out of Food in the Last Year: Never true  Transportation Needs: No Transportation Needs (04/18/2023)   PRAPARE - Transportation    Lack of  Transportation (Medical): No    Lack of Transportation (Non-Medical): No  Physical Activity: Insufficiently Active (04/18/2023)   Exercise Vital Sign    Days of Exercise per Week: 3 days    Minutes of Exercise per Session: 10 min  Stress: No Stress Concern Present (04/18/2023)   [employer] of Occupational Health - Occupational Stress Questionnaire    Feeling of Stress : Not at all  Social Connections: Moderately Integrated (04/18/2023)   Social Connection and Isolation Panel [NHANES]    Frequency of Communication with Friends and Family: More than three times a week    Frequency of Social Gatherings with Friends and Family: Once a week    Attends Religious Services: More than 4 times per year    Active Member of [employer] or Organizations: Yes    Attends Banker Meetings: More than 4 times per year    Marital Status: Widowed  Intimate  Partner Violence: Not At Risk (01/10/2023)   Humiliation, Afraid, Rape, and Kick questionnaire    Fear of Current or Ex-Partner: No    Emotionally Abused: No    Physically Abused: No    Sexually Abused: No    Past Surgical History:  Procedure Laterality Date   ABDOMINAL HYSTERECTOMY     BREAST SURGERY     Bilateral breast reduction   COLOSTOMY  04-22-2010   [facility]   cone bx     cervix   CORONARY ARTERY BYPASS GRAFT N/A 05/28/2019   Procedure: CORONARY ARTERY BYPASS GRAFTING (CABG) using the LIMA to LAD; Endoscopic right saphenous vein harvesting to the Ramus and Diag1.;  Surgeon: Delight Ovens, MD;  Location: [facility] OR;  Service: Open Heart Surgery;  Laterality: N/A;   LEFT HEART CATH AND CORONARY ANGIOGRAPHY N/A 05/25/2019   Procedure: LEFT HEART CATH AND CORONARY ANGIOGRAPHY;  Surgeon: Marykay Lex, MD;  Location: [facility] INVASIVE CV LAB;  Service: Cardiovascular;  Laterality: N/A;   REDUCTION MAMMAPLASTY Bilateral    TEE WITHOUT CARDIOVERSION N/A 05/28/2019   Procedure: TRANSESOPHAGEAL ECHOCARDIOGRAM (TEE);  Surgeon: Delight Ovens, MD;  Location: [facility] OR;  Service: Open Heart Surgery;  Laterality: N/A;   WISDOM TOOTH EXTRACTION      Family History  Problem Relation Age of Onset   Cancer Sister        melanoma- with mets   Colon cancer Father 64   Stomach cancer Maternal Grandfather    Breast cancer Mother 60   Breast cancer Daughter 24   Rectal cancer Neg Hx    Esophageal cancer Neg Hx     Allergies  Allergen Reactions   Penicillins Shortness Of Breath    red face Did it involve swelling of the face/tongue/throat, SOB, or low BP? Yes Did it involve sudden or severe rash/hives, skin peeling, or any reaction on the inside of your mouth or nose? Yes Did you need to seek medical attention at a hospital or doctor's office? Yes When did it last happen?      40+ years If all above answers are "NO", may proceed with cephalosporin use.    Coreg [Carvedilol] Rash    Amlodipine     Pt states it made her feel "awful and weird."   Metoprolol Other (See Comments)    " feel weird"   Betadine [Povidone Iodine] Rash   Hydrochlorothiazide Other (See Comments)    Fatigue    Hydrocodone-Acetaminophen Rash    ras   Lisinopril     Fatigue    Tramadol Itching and  Rash    Current Outpatient Medications on File Prior to Visit  Medication Sig Dispense Refill   Ascorbic Acid (VITAMIN C) 1000 MG tablet Take 1,000 mg by mouth daily.     aspirin 81 MG tablet Take 1 tablet (81 mg total) by mouth daily.     atenolol (TENORMIN) 50 MG tablet TAKE 1 TABLET DAILY (TO REPLACE 25 MG DOSE) 90 tablet 3   atorvastatin (LIPITOR) 40 MG tablet TAKE 1 TABLET DAILY 90 tablet 3   Cholecalciferol (VITAMIN D) 2000 UNITS CAPS Take by mouth.     doxycycline (VIBRAMYCIN) 100 MG capsule Take 1 capsule (100 mg total) by mouth 2 (two) times daily. 14 capsule 0   fluocinonide ointment (LIDEX) 0.05 % Apply 1 application topically daily as needed (itching). 60 g 1   ibuprofen (ADVIL) 200 MG tablet Take 400 mg by mouth in the morning and at bedtime.     levothyroxine (SYNTHROID) 112 MCG tablet TAKE 1 TABLET DAILY 30 tablet 0   losartan (COZAAR) 100 MG tablet TAKE 1 TABLET DAILY 90 tablet 3   No current facility-administered medications on file prior to visit.    BP (!) 140/80   Pulse 64   Temp 97.7 F (36.5 C) (Oral)   Ht 5\' 1"  (1.549 m)   Wt 194 lb (88 kg)   SpO2 98%   BMI 36.66 kg/m       Objective:   Physical Exam Vitals and nursing note reviewed.  Constitutional:      Appearance: Normal appearance.  Cardiovascular:     Rate and Rhythm: Normal rate and regular rhythm. Occasional Extrasystoles are present.    Pulses: Normal pulses.     Heart sounds: Normal heart sounds.  Pulmonary:     Effort: Pulmonary effort is normal.     Breath sounds: Normal breath sounds.  Musculoskeletal:        General: Normal range of motion.  Skin:    General: Skin is warm and dry.   Neurological:     General: No focal deficit present.     Mental Status: She is alert and oriented to person, place, and time.  Psychiatric:        Mood and Affect: Mood normal.        Behavior: Behavior normal.        Thought Content: Thought content normal.        Judgment: Judgment normal.       Assessment & Plan:  1. Essential hypertension (Primary) - BP 140/80 today in the office on two separate occasions. She does have an occasional PVC and I am wondering if her home machine is picking this up causes a false high BP. She has her appointment with Cardiology next week,  asked her to bring her BP cuff to this appointment to have them check her cuff against a cuff.   Shirline Frees, NP

## 2023-06-23 ENCOUNTER — Ambulatory Visit: Payer: Medicare Other | Admitting: Adult Health

## 2023-06-27 ENCOUNTER — Ambulatory Visit: Payer: Medicare Other | Attending: Cardiovascular Disease | Admitting: Cardiovascular Disease

## 2023-06-27 ENCOUNTER — Encounter: Payer: Self-pay | Admitting: Cardiovascular Disease

## 2023-06-27 VITALS — BP 132/84 | HR 55 | Ht 62.0 in | Wt 195.2 lb

## 2023-06-27 DIAGNOSIS — E785 Hyperlipidemia, unspecified: Secondary | ICD-10-CM | POA: Diagnosis not present

## 2023-06-27 DIAGNOSIS — I10 Essential (primary) hypertension: Secondary | ICD-10-CM | POA: Insufficient documentation

## 2023-06-27 DIAGNOSIS — Z951 Presence of aortocoronary bypass graft: Secondary | ICD-10-CM | POA: Diagnosis not present

## 2023-06-27 NOTE — Assessment & Plan Note (Signed)
History of dyslipidemia on statin therapy with lipid profile performed 04/09/2022 revealing a total cholesterol of 150 LDL 66 and HDL of 50.

## 2023-06-27 NOTE — Patient Instructions (Signed)

## 2023-06-27 NOTE — Assessment & Plan Note (Signed)
History of essential hypertension her blood pressure measured today at 168/76.  She says that she checks her blood pressure at home and it runs in the 130-140/60-70 range.  She is on atenolol and losartan.

## 2023-06-27 NOTE — Assessment & Plan Note (Signed)
History of CAD status post cardiac catheterization performed by Dr. Herbie Baltimore 05/25/2019 revealing severe three-vessel disease requiring surgical revascularization which was performed 3 days later back to [facility].  She had a LIMA to her LAD, vein to a ramus branch and diagonal branch.  She has done well since and is asymptomatic.

## 2023-06-27 NOTE — Progress Notes (Signed)
06/27/2023 [name]   06-26-1941  [id number]  Primary Physician Nafziger, Kandee Keen, NP Primary Cardiologist: Runell Gess MD Nicholes Calamity, [place]  HPI:  Hannah Morrow is a 82 y.o.    moderately overweight married Caucasian female mother of 2 daughters, grandmother of 5 grandchildren and great grandmother of 9 great grandchildren referred by Hannah Frees NP for evaluation of chest pain.  Her husband Hannah Morrow was also a patient of mine. They were married for 42 years.  I last saw her in the office 06/23/2022. 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  Her risk factors include treated hypertension and hyperlipidemia.  There is no family history of heart disease.  She is never had a heart attack or stroke.  She is otherwise healthy.  She does have a history of GERD.  She had chest pain last week lasting 3 days rating to her neck back shoulders and throat relieved with eructation.  She has had no recurrent symptoms.   I had arranged for her to undergo undergo coronary CTA which was not scheduled scheduled until the beginning of December.  She came back in to see Corine Shelter because of progressive angina on 05/23/2019 with anterior T wave inversion more pronounced than on the prior EKG.  She was admitted for cardiac cath performed by Dr. Herbie Baltimore on 05/25/2019 revealing severe two-vessel disease requiring surgical revascularization which was performed 3 days later by Dr. Tyrone Sage.  She had LIMA to LAD, vein to ramus branch and diagonal branch.  She was discharged home on 28 November and has been recuperating at home with major complaints of fatigue.   Since I saw her a year ago she continues to do well.   She denies chest pain or shortness of breath.  Her husband Hannah Morrow , who is a patient of mine, unfortunately passed away 2 years ago.  They have been married for 42 years.  She says this is a "tough time a year".  She lives alone but does have family close by.      Current Meds  Medication Sig   Ascorbic Acid (VITAMIN C) 1000 MG tablet Take 1,000 mg by mouth daily.   aspirin 81 MG tablet Take 1 tablet (81 mg total) by mouth daily.   atenolol (TENORMIN) 50 MG tablet TAKE 1 TABLET DAILY (TO REPLACE 25 MG DOSE)   atorvastatin (LIPITOR) 40 MG tablet TAKE 1 TABLET DAILY   Cholecalciferol (VITAMIN D) 2000 UNITS CAPS Take by mouth.   fluocinonide ointment (LIDEX) 0.05 % Apply 1 application topically daily as needed (itching).   ibuprofen (ADVIL) 200 MG tablet Take 400 mg by mouth in the morning and at bedtime.   levothyroxine (SYNTHROID) 112 MCG tablet Take 1 tablet (112 mcg total) by mouth daily.   losartan (COZAAR) 100 MG tablet TAKE 1 TABLET DAILY     Allergies  Allergen Reactions   Penicillins Shortness Of Breath    red face Did it involve swelling of the face/tongue/throat, SOB, or low BP? Yes Did it involve sudden or severe rash/hives, skin peeling, or any reaction on the inside of your mouth or nose? Yes Did you need to seek medical attention at a hospital or doctor's office? Yes When did it last happen?      40+ years If all above answers are "NO", may proceed with cephalosporin use.    Coreg [Carvedilol] Rash   Amlodipine     Pt states it made her feel "awful  and weird."   Metoprolol Other (See Comments)    " feel weird"   Betadine [Povidone Iodine] Rash   Hydrochlorothiazide Other (See Comments)    Fatigue    Hydrocodone-Acetaminophen Rash    ras   Lisinopril     Fatigue    Tramadol Itching and Rash    Social History   Socioeconomic History   Marital status: Widowed    Spouse name: Not on file   Number of children: 2   Years of education: Not on file   Highest education level: Some college, no degree  Occupational History   Not on file  Tobacco Use   Smoking status: Never   Smokeless tobacco: Never  Vaping Use   Vaping status: Not on file  Substance and Sexual Activity   Alcohol use: Yes    Alcohol/week: 5.0  standard drinks of alcohol    Types: 5 Glasses of wine per week    Comment: Occasional   Drug use: No   Sexual activity: Yes    Partners: Male    Birth control/protection: Surgical, Other-see comments    Comment: hysterectomy  Other Topics Concern   Not on file  Social History Narrative   Married for 35 years   Two daughters, five grand children and 5 great grand children. Daughter lives in [place]. Other daughter lives in [place].    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    Exercise Does not exercise. Was going to exercise class but has since quite.    Diet: Veggies, chicken, salmon. Very little red meat. Eats lots of eggs.    Exercise --- walking , gym 3 classes a week   Social Drivers of Health   Financial Resource Strain: Low Risk  (04/18/2023)   Overall Financial Resource Strain (CARDIA)    Difficulty of Paying Living Expenses: Not hard at all  Food Insecurity: No Food Insecurity (04/18/2023)   Hunger Vital Sign    Worried About Running Out of Food in the Last Year: Never true    Ran Out of Food in the Last Year: Never true  Transportation Needs: No Transportation Needs (04/18/2023)   PRAPARE - Administrator, Civil Service (Medical): No    Lack of Transportation (Non-Medical): No  Physical Activity: Insufficiently Active (04/18/2023)   Exercise Vital Sign    Days of Exercise per Week: 3 days    Minutes of Exercise per Session: 10 min  Stress: No Stress Concern Present (04/18/2023)   [employer] of Occupational Health - Occupational Stress Questionnaire    Feeling of Stress : Not at all  Social Connections: Moderately Integrated (04/18/2023)   Social Connection and Isolation Panel [NHANES]    Frequency of Communication with Friends and Family: More than three times a week    Frequency of Social Gatherings with Friends and Family: Once a week    Attends Religious Services: More than 4 times per year     Active Member of [employer] or Organizations: Yes    Attends Banker Meetings: More than 4 times per year    Marital Status: Widowed  Intimate Partner Violence: Not At Risk (01/10/2023)   Humiliation, Afraid, Rape, and Kick questionnaire    Fear of Current or Ex-Partner: No    Emotionally Abused: No    Physically Abused: No    Sexually Abused: No     Review of Systems: General: negative for  chills, fever, night sweats or weight changes.  Cardiovascular: negative for chest pain, dyspnea on exertion, edema, orthopnea, palpitations, paroxysmal nocturnal dyspnea or shortness of breath Dermatological: negative for rash Respiratory: negative for cough or wheezing Urologic: negative for hematuria Abdominal: negative for nausea, vomiting, diarrhea, bright red blood per rectum, melena, or hematemesis Neurologic: negative for visual changes, syncope, or dizziness All other systems reviewed and are otherwise negative except as noted above.    Blood pressure (!) 168/76, pulse (!) 55, height 5\' 2"  (1.575 m), weight 195 lb 3.2 oz (88.5 kg), SpO2 95%.  General appearance: alert and no distress Neck: no adenopathy, no carotid bruit, no JVD, supple, symmetrical, trachea midline, and thyroid not enlarged, symmetric, no tenderness/mass/nodules Lungs: clear to auscultation bilaterally Heart: regular rate and rhythm, S1, S2 normal, no murmur, click, rub or gallop Extremities: extremities normal, atraumatic, no cyanosis or edema Pulses: 2+ and symmetric Skin: Skin color, texture, turgor normal. No rashes or lesions Neurologic: Grossly normal  EKG EKG Interpretation Date/Time:  Monday June 27 2023 10:18:13 EST Ventricular Rate:  58 PR Interval:  200 QRS Duration:  82 QT Interval:  432 QTC Calculation: 424 R Axis:   -34  Text Interpretation: Sinus bradycardia Left axis deviation Minimal voltage criteria for LVH, may be normal variant ( R in aVL ) Possible Anterolateral infarct , age  undetermined When compared with ECG of 29-Sep-2022 13:48, PREVIOUS ECG IS PRESENT Confirmed by Nanetta Batty [phone]) on 06/27/2023 10:30:10 AM    ASSESSMENT AND PLAN:   Dyslipidemia, goal LDL below 70 History of dyslipidemia on statin therapy with lipid profile performed 04/09/2022 revealing a total cholesterol of 150 LDL 66 and HDL of 50.  Essential hypertension History of essential hypertension her blood pressure measured today at 168/76.  She says that she checks her blood pressure at home and it runs in the 130-140/60-70 range.  She is on atenolol and losartan.  S/P CABG x 3 History of CAD status post cardiac catheterization performed by Dr. Herbie Baltimore 05/25/2019 revealing severe three-vessel disease requiring surgical revascularization which was performed 3 days later back to [facility].  She had a LIMA to her LAD, vein to a ramus branch and diagonal branch.  She has done well since and is asymptomatic.     Runell Gess MD FACP,FACC,FAHA, [facility] 06/27/2023 10:38 AM

## 2023-07-22 ENCOUNTER — Encounter: Payer: Self-pay | Admitting: Adult Health

## 2023-07-22 ENCOUNTER — Ambulatory Visit (INDEPENDENT_AMBULATORY_CARE_PROVIDER_SITE_OTHER): Payer: Medicare Other | Admitting: Adult Health

## 2023-07-22 VITALS — BP 132/82 | HR 56 | Temp 97.5°F | Ht 61.5 in | Wt 195.0 lb

## 2023-07-22 DIAGNOSIS — E039 Hypothyroidism, unspecified: Secondary | ICD-10-CM

## 2023-07-22 DIAGNOSIS — M85851 Other specified disorders of bone density and structure, right thigh: Secondary | ICD-10-CM

## 2023-07-22 DIAGNOSIS — E782 Mixed hyperlipidemia: Secondary | ICD-10-CM

## 2023-07-22 DIAGNOSIS — Z951 Presence of aortocoronary bypass graft: Secondary | ICD-10-CM

## 2023-07-22 DIAGNOSIS — I10 Essential (primary) hypertension: Secondary | ICD-10-CM

## 2023-07-22 LAB — LIPID PANEL
Cholesterol: 153 mg/dL (ref 0–200)
HDL: 52.4 mg/dL (ref >39.00)
LDL Cholesterol: 73 mg/dL (ref 0–99)
NonHDL: 100.21
Total CHOL/HDL Ratio: 3
Triglycerides: 136 mg/dL (ref 0.0–149.0)
VLDL: 27.2 mg/dL (ref 0.0–40.0)

## 2023-07-22 LAB — COMPREHENSIVE METABOLIC PANEL
ALT: 18 U/L (ref 0–35)
AST: 24 U/L (ref 0–37)
Albumin: 4.5 g/dL (ref 3.5–5.2)
Alkaline Phosphatase: 64 U/L (ref 39–117)
BUN: 30 mg/dL — ABNORMAL HIGH (ref 6–23)
CO2: 28 meq/L (ref 19–32)
Calcium: 9.8 mg/dL (ref 8.4–10.5)
Chloride: 104 meq/L (ref 96–112)
Creatinine, Ser: 1.33 mg/dL — ABNORMAL HIGH (ref 0.40–1.20)
GFR: 37.57 mL/min — ABNORMAL LOW (ref >60.00)
Glucose, Bld: 118 mg/dL — ABNORMAL HIGH (ref 70–99)
Potassium: 4.5 meq/L (ref 3.5–5.1)
Sodium: 140 meq/L (ref 135–145)
Total Bilirubin: 0.9 mg/dL (ref 0.2–1.2)
Total Protein: 7.5 g/dL (ref 6.0–8.3)

## 2023-07-22 LAB — CBC WITH DIFFERENTIAL/PLATELET
Basophils Absolute: 0 10*3/uL (ref 0.0–0.1)
Basophils Relative: 0.5 % (ref 0.0–3.0)
Eosinophils Absolute: 0.3 10*3/uL (ref 0.0–0.7)
Eosinophils Relative: 5 % (ref 0.0–5.0)
HCT: 43.8 % (ref 36.0–46.0)
Hemoglobin: 13.9 g/dL (ref 12.0–15.0)
Lymphocytes Relative: 39.8 % (ref 12.0–46.0)
Lymphs Abs: 2.5 10*3/uL (ref 0.7–4.0)
MCHC: 31.8 g/dL (ref 30.0–36.0)
MCV: 80.8 fL (ref 78.0–100.0)
Monocytes Absolute: 0.7 10*3/uL (ref 0.1–1.0)
Monocytes Relative: 10.7 % (ref 3.0–12.0)
Neutro Abs: 2.8 10*3/uL (ref 1.4–7.7)
Neutrophils Relative %: 44 % (ref 43.0–77.0)
Platelets: 213 10*3/uL (ref 150.0–400.0)
RBC: 5.43 Mil/uL — ABNORMAL HIGH (ref 3.87–5.11)
RDW: 13.2 % (ref 11.5–15.5)
WBC: 6.3 10*3/uL (ref 4.0–10.5)

## 2023-07-22 LAB — TSH: TSH: 7.66 u[IU]/mL — ABNORMAL HIGH (ref 0.35–5.50)

## 2023-07-22 LAB — VITAMIN D 25 HYDROXY (VIT D DEFICIENCY, FRACTURES): VITD: 38.39 ng/mL (ref 30.00–100.00)

## 2023-07-22 NOTE — Progress Notes (Signed)
Subjective:    Patient ID: Hannah Morrow, female    DOB: January 09, 1941, 83 y.o.   MRN: [id number]  HPI Patient presents for yearly preventative medicine examination. She is a pleasant 83 year old female who  has a past medical history of Allergy, ANXIETY, Cancer (HCC), GERD, Hyperlipidemia, Hypertension, Hypothyroid, Plantar fasciitis, and RESTLESS LEGS SYNDROME.  Hyperlipidemia-prescribed Lipitor 40 mg daily.  She denies myalgia or fatigue  hypertension -takes atenolol 50 mg daily and Cozaar 100 mg daily.  She denies dizziness, lightheadedness, chest pain, or shortness of breath.  She does monitor her blood pressure at home with readings in the 120s-140s systolic consistently BP Readings from Last 3 Encounters:  07/22/23 132/82  06/27/23 132/84  06/21/23 (!) 140/80   Hypothyroidism -managed with Synthroid 112 mcg daily Lab Results  Component Value Date   TSH 4.42 04/09/2022   CAD-status post coronary artery bypass grafting x3 in November 2020.  She is prescribed Lipitor 40 mg daily and aspirin 81 mg.  She denies chest pain, shortness of breath, dyspnea on exertion, lower extremity edema, or palpitations.  Osteopenia -last bone density screen in July 2022 showed osteopenia with a T-score of -1.7 in the right femoral neck.  All immunizations and health maintenance protocols were reviewed with the patient and needed orders were placed.  Appropriate screening laboratory values were ordered for the patient including screening of hyperlipidemia, renal function and hepatic function.   Medication reconciliation,  past medical history, social history, problem list and allergies were reviewed in detail with the patient  Goals were established with regard to weight loss, exercise, and  diet in compliance with medications Wt Readings from Last 3 Encounters:  07/22/23 195 lb (88.5 kg)  06/27/23 195 lb 3.2 oz (88.5 kg)  06/21/23 194 lb (88 kg)   Review of Systems  Constitutional:  Negative.   HENT: Negative.    Eyes: Negative.   Respiratory: Negative.    Cardiovascular: Negative.   Gastrointestinal: Negative.   Endocrine: Negative.   Genitourinary: Negative.   Musculoskeletal: Negative.   Skin: Negative.   Allergic/Immunologic: Negative.   Neurological: Negative.   Hematological: Negative.   Psychiatric/Behavioral: Negative.     Past Medical History:  Diagnosis Date   Allergy    no meds   ANXIETY    Cancer (HCC)    cervical cancer - hysterectomy   GERD    Hyperlipidemia    Hypertension    Hypothyroid    Plantar fasciitis    right   RESTLESS LEGS SYNDROME     Social History   Socioeconomic History   Marital status: Widowed    Spouse name: Not on file   Number of children: 2   Years of education: Not on file   Highest education level: Some college, no degree  Occupational History   Not on file  Tobacco Use   Smoking status: Never   Smokeless tobacco: Never  Vaping Use   Vaping status: Not on file  Substance and Sexual Activity   Alcohol use: Yes    Alcohol/week: 5.0 standard drinks of alcohol    Types: 5 Glasses of wine per week    Comment: Occasional   Drug use: No   Sexual activity: Yes    Partners: Male    Birth control/protection: Surgical, Other-see comments    Comment: hysterectomy  Other Topics Concern   Not on file  Social History Narrative   Married for 35 years   Two daughters, five grand children and  5 great grand children. Daughter lives in [place]. Other daughter lives in [place].    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    Exercise Does not exercise. Was going to exercise class but has since quite.    Diet: Veggies, chicken, salmon. Very little red meat. Eats lots of eggs.    Exercise --- walking , gym 3 classes a week   Social Drivers of Health   Financial Resource Strain: Low Risk  (07/18/2023)   Overall Financial Resource Strain (CARDIA)    Difficulty of  Paying Living Expenses: Not hard at all  Food Insecurity: No Food Insecurity (07/18/2023)   Hunger Vital Sign    Worried About Running Out of Food in the Last Year: Never true    Ran Out of Food in the Last Year: Never true  Transportation Needs: No Transportation Needs (07/18/2023)   PRAPARE - Administrator, Civil Service (Medical): No    Lack of Transportation (Non-Medical): No  Physical Activity: Insufficiently Active (07/18/2023)   Exercise Vital Sign    Days of Exercise per Week: 3 days    Minutes of Exercise per Session: 20 min  Stress: No Stress Concern Present (07/18/2023)   [employer] of Occupational Health - Occupational Stress Questionnaire    Feeling of Stress : Not at all  Social Connections: Moderately Integrated (07/18/2023)   Social Connection and Isolation Panel [NHANES]    Frequency of Communication with Friends and Family: More than three times a week    Frequency of Social Gatherings with Friends and Family: Once a week    Attends Religious Services: More than 4 times per year    Active Member of [employer] or Organizations: Yes    Attends Banker Meetings: More than 4 times per year    Marital Status: Widowed  Intimate Partner Violence: Not At Risk (01/10/2023)   Humiliation, Afraid, Rape, and Kick questionnaire    Fear of Current or Ex-Partner: No    Emotionally Abused: No    Physically Abused: No    Sexually Abused: No    Past Surgical History:  Procedure Laterality Date   ABDOMINAL HYSTERECTOMY     BREAST SURGERY     Bilateral breast reduction   COLOSTOMY  04-22-2010   [facility]   cone bx     cervix   CORONARY ARTERY BYPASS GRAFT N/A 05/28/2019   Procedure: CORONARY ARTERY BYPASS GRAFTING (CABG) using the LIMA to LAD; Endoscopic right saphenous vein harvesting to the Ramus and Diag1.;  Surgeon: Delight Ovens, MD;  Location: [facility] OR;  Service: Open Heart Surgery;  Laterality: N/A;   LEFT HEART CATH AND CORONARY ANGIOGRAPHY N/A  05/25/2019   Procedure: LEFT HEART CATH AND CORONARY ANGIOGRAPHY;  Surgeon: Marykay Lex, MD;  Location: [facility] INVASIVE CV LAB;  Service: Cardiovascular;  Laterality: N/A;   REDUCTION MAMMAPLASTY Bilateral    TEE WITHOUT CARDIOVERSION N/A 05/28/2019   Procedure: TRANSESOPHAGEAL ECHOCARDIOGRAM (TEE);  Surgeon: Delight Ovens, MD;  Location: [facility] OR;  Service: Open Heart Surgery;  Laterality: N/A;   WISDOM TOOTH EXTRACTION      Family History  Problem Relation Age of Onset   Cancer Sister        melanoma- with mets   Colon cancer Father 39   Stomach cancer Maternal Grandfather    Breast cancer Mother 87   Breast cancer Daughter 53   Rectal cancer Neg  Hx    Esophageal cancer Neg Hx     Allergies  Allergen Reactions   Penicillins Shortness Of Breath    red face Did it involve swelling of the face/tongue/throat, SOB, or low BP? Yes Did it involve sudden or severe rash/hives, skin peeling, or any reaction on the inside of your mouth or nose? Yes Did you need to seek medical attention at a hospital or doctor's office? Yes When did it last happen?      40+ years If all above answers are "NO", may proceed with cephalosporin use.    Coreg [Carvedilol] Rash   Amlodipine     Pt states it made her feel "awful and weird."   Metoprolol Other (See Comments)    " feel weird"   Betadine [Povidone Iodine] Rash   Hydrochlorothiazide Other (See Comments)    Fatigue    Hydrocodone-Acetaminophen Rash    ras   Lisinopril     Fatigue    Tramadol Itching and Rash    Current Outpatient Medications on File Prior to Visit  Medication Sig Dispense Refill   Ascorbic Acid (VITAMIN C) 1000 MG tablet Take 1,000 mg by mouth daily.     aspirin 81 MG tablet Take 1 tablet (81 mg total) by mouth daily.     atenolol (TENORMIN) 50 MG tablet TAKE 1 TABLET DAILY (TO REPLACE 25 MG DOSE) 90 tablet 3   atorvastatin (LIPITOR) 40 MG tablet TAKE 1 TABLET DAILY 90 tablet 3   Cholecalciferol (VITAMIN D) 2000  UNITS CAPS Take by mouth.     fluocinonide ointment (LIDEX) 0.05 % Apply 1 application topically daily as needed (itching). 60 g 1   ibuprofen (ADVIL) 200 MG tablet Take 400 mg by mouth in the morning and at bedtime.     levothyroxine (SYNTHROID) 112 MCG tablet Take 1 tablet (112 mcg total) by mouth daily. 90 tablet 0   losartan (COZAAR) 100 MG tablet TAKE 1 TABLET DAILY 90 tablet 3   No current facility-administered medications on file prior to visit.    BP 132/82   Pulse (!) 56   Temp (!) 97.5 F (36.4 C) (Oral)   Ht 5' 1.5" (1.562 m)   Wt 195 lb (88.5 kg)   SpO2 97%   BMI 36.25 kg/m       Objective:   Physical Exam Vitals and nursing note reviewed.  Constitutional:      General: She is not in acute distress.    Appearance: Normal appearance. She is not ill-appearing.  HENT:     Head: Normocephalic and atraumatic.     Right Ear: Tympanic membrane, ear canal and external ear normal. There is no impacted cerumen.     Left Ear: Tympanic membrane, ear canal and external ear normal. There is no impacted cerumen.     Nose: Nose normal. No congestion or rhinorrhea.     Mouth/Throat:     Mouth: Mucous membranes are moist.     Pharynx: Oropharynx is clear.  Eyes:     Extraocular Movements: Extraocular movements intact.     Conjunctiva/sclera: Conjunctivae normal.     Pupils: Pupils are equal, round, and reactive to light.  Neck:     Vascular: No carotid bruit.  Cardiovascular:     Rate and Rhythm: Normal rate and regular rhythm.     Pulses: Normal pulses.     Heart sounds: No murmur heard.    No friction rub. No gallop.  Pulmonary:     Effort: Pulmonary  effort is normal.     Breath sounds: Normal breath sounds.  Abdominal:     General: Abdomen is flat. Bowel sounds are normal. There is no distension.     Palpations: Abdomen is soft. There is no mass.     Tenderness: There is no abdominal tenderness. There is no guarding or rebound.     Hernia: No hernia is present.   Musculoskeletal:        General: Normal range of motion.     Cervical back: Normal range of motion and neck supple.  Lymphadenopathy:     Cervical: No cervical adenopathy.  Skin:    General: Skin is warm and dry.     Capillary Refill: Capillary refill takes less than 2 seconds.  Neurological:     General: No focal deficit present.     Mental Status: She is alert and oriented to person, place, and time.  Psychiatric:        Mood and Affect: Mood normal.        Behavior: Behavior normal.        Thought Content: Thought content normal.        Judgment: Judgment normal.       Assessment & Plan:  1. Mixed hyperlipidemia (Primary)  - CBC with Differential/Platelet; Future - Comprehensive metabolic panel; Future - Lipid panel; Future - TSH; Future  2. Essential hypertension  - CBC with Differential/Platelet; Future - Comprehensive metabolic panel; Future - Lipid panel; Future - TSH; Future  3. Hypothyroidism, unspecified type  - CBC with Differential/Platelet; Future - Comprehensive metabolic panel; Future - Lipid panel; Future - TSH; Future  4. S/P CABG x 3  - CBC with Differential/Platelet; Future - Comprehensive metabolic panel; Future - Lipid panel; Future - TSH; Future  5. Osteopenia of neck of right femur  - VITAMIN D 25 Hydroxy (Vit-D Deficiency, Fractures); Future - DG Bone Density; Future  Shirline Frees, NP

## 2023-07-22 NOTE — Patient Instructions (Addendum)
It was great seeing you today   We will follow up with you regarding your lab work   Please let me know if you need anything   Schedule your bone density test at check out desk. You may also call directly to X-ray at [phone] to schedule an appointment that is convenient for you.  - located [address] across the street from  - in the basement - you do need an appointment for the bone density tests.  

## 2023-07-27 ENCOUNTER — Other Ambulatory Visit: Payer: Self-pay | Admitting: Adult Health

## 2023-07-27 DIAGNOSIS — E039 Hypothyroidism, unspecified: Secondary | ICD-10-CM

## 2023-07-27 MED ORDER — LEVOTHYROXINE SODIUM 125 MCG PO TABS: 125.0000 ug | ORAL_TABLET | Freq: Every day | ORAL | 0 refills | Status: DC

## 2023-08-04 DIAGNOSIS — H52203 Unspecified astigmatism, bilateral: Secondary | ICD-10-CM | POA: Diagnosis not present

## 2023-08-04 DIAGNOSIS — Z961 Presence of intraocular lens: Secondary | ICD-10-CM | POA: Diagnosis not present

## 2023-08-04 DIAGNOSIS — H35373 Puckering of macula, bilateral: Secondary | ICD-10-CM | POA: Diagnosis not present

## 2023-08-08 ENCOUNTER — Encounter: Payer: Self-pay | Admitting: Internal Medicine

## 2023-08-08 ENCOUNTER — Ambulatory Visit (INDEPENDENT_AMBULATORY_CARE_PROVIDER_SITE_OTHER): Payer: Medicare Other | Admitting: Internal Medicine

## 2023-08-08 VITALS — BP 130/70 | HR 76 | Temp 98.3°F | Wt 197.8 lb

## 2023-08-08 DIAGNOSIS — R059 Cough, unspecified: Secondary | ICD-10-CM | POA: Diagnosis not present

## 2023-08-08 DIAGNOSIS — U071 COVID-19: Secondary | ICD-10-CM

## 2023-08-08 DIAGNOSIS — G4483 Primary cough headache: Secondary | ICD-10-CM

## 2023-08-08 LAB — POCT INFLUENZA A/B: Influenza A, POC: NEGATIVE

## 2023-08-08 LAB — POC COVID19 BINAXNOW: SARS Coronavirus 2 Ag: POSITIVE — AB

## 2023-08-08 MED ORDER — NIRMATRELVIR/RITONAVIR (PAXLOVID)TABLET: 3.0000 | ORAL_TABLET | Freq: Two times a day (BID) | ORAL | 0 refills | Status: AC

## 2023-08-08 NOTE — Progress Notes (Signed)
Established Patient Office Visit     CC/Reason for Visit: Flulike symptoms  HPI: Hannah Morrow is a 83 y.o. female who is coming in today for the above mentioned reasons.  For the past day and a half has been having myalgias, congestion, runny nose and cough.  Came in today for evaluation.  No sick contacts that she is aware of.   Past Medical/Surgical History: Past Medical History:  Diagnosis Date   Allergy    no meds   ANXIETY    Cancer (HCC)    cervical cancer - hysterectomy   GERD    Hyperlipidemia    Hypertension    Hypothyroid    Plantar fasciitis    right   RESTLESS LEGS SYNDROME     Past Surgical History:  Procedure Laterality Date   ABDOMINAL HYSTERECTOMY     BREAST SURGERY     Bilateral breast reduction   COLOSTOMY  04-22-2010   [facility]   cone bx     cervix   CORONARY ARTERY BYPASS GRAFT N/A 05/28/2019   Procedure: CORONARY ARTERY BYPASS GRAFTING (CABG) using the LIMA to LAD; Endoscopic right saphenous vein harvesting to the Ramus and Diag1.;  Surgeon: Delight Ovens, MD;  Location: [facility] OR;  Service: Open Heart Surgery;  Laterality: N/A;   LEFT HEART CATH AND CORONARY ANGIOGRAPHY N/A 05/25/2019   Procedure: LEFT HEART CATH AND CORONARY ANGIOGRAPHY;  Surgeon: Marykay Lex, MD;  Location: [facility] INVASIVE CV LAB;  Service: Cardiovascular;  Laterality: N/A;   REDUCTION MAMMAPLASTY Bilateral    TEE WITHOUT CARDIOVERSION N/A 05/28/2019   Procedure: TRANSESOPHAGEAL ECHOCARDIOGRAM (TEE);  Surgeon: Delight Ovens, MD;  Location: [facility];  Service: Open Heart Surgery;  Laterality: N/A;   WISDOM TOOTH EXTRACTION      Social History:  reports that she has never smoked. She has never used smokeless tobacco. She reports current alcohol use of about 5.0 standard drinks of alcohol per week. She reports that she does not use drugs.  Allergies: Allergies  Allergen Reactions   Penicillins Shortness Of Breath    red face Did it involve swelling of  the face/tongue/throat, SOB, or low BP? Yes Did it involve sudden or severe rash/hives, skin peeling, or any reaction on the inside of your mouth or nose? Yes Did you need to seek medical attention at a hospital or doctor's office? Yes When did it last happen?      40+ years If all above answers are "NO", may proceed with cephalosporin use.    Coreg [Carvedilol] Rash   Amlodipine     Pt states it made her feel "awful and weird."   Metoprolol Other (See Comments)    " feel weird"   Betadine [Povidone Iodine] Rash   Hydrochlorothiazide Other (See Comments)    Fatigue    Hydrocodone-Acetaminophen Rash    ras   Lisinopril     Fatigue    Tramadol Itching and Rash    Family History:  Family History  Problem Relation Age of Onset   Cancer Sister        melanoma- with mets   Colon cancer Father 35   Stomach cancer Maternal Grandfather    Breast cancer Mother 27   Breast cancer Daughter 42   Rectal cancer Neg Hx    Esophageal cancer Neg Hx      Current Outpatient Medications:    Ascorbic Acid (VITAMIN C) 1000 MG tablet, Take 1,000 mg by mouth daily., Disp: ,  Rfl:    aspirin 81 MG tablet, Take 1 tablet (81 mg total) by mouth daily., Disp: , Rfl:    atenolol (TENORMIN) 50 MG tablet, TAKE 1 TABLET DAILY (TO REPLACE 25 MG DOSE), Disp: 90 tablet, Rfl: 3   atorvastatin (LIPITOR) 40 MG tablet, TAKE 1 TABLET DAILY, Disp: 90 tablet, Rfl: 3   Cholecalciferol (VITAMIN D) 2000 UNITS CAPS, Take by mouth., Disp: , Rfl:    fluocinonide ointment (LIDEX) 0.05 %, Apply 1 application topically daily as needed (itching)., Disp: 60 g, Rfl: 1   ibuprofen (ADVIL) 200 MG tablet, Take 400 mg by mouth in the morning and at bedtime., Disp: , Rfl:    levothyroxine (SYNTHROID) 125 MCG tablet, Take 1 tablet (125 mcg total) by mouth daily., Disp: 30 tablet, Rfl: 0   losartan (COZAAR) 100 MG tablet, TAKE 1 TABLET DAILY, Disp: 90 tablet, Rfl: 3   nirmatrelvir/ritonavir (PAXLOVID) 20 x 150 MG & 10 x 100MG  TABS,  Take 3 tablets by mouth 2 (two) times daily for 5 days. (Take nirmatrelvir 150 mg two tablets twice daily for 5 days and ritonavir 100 mg one tablet twice daily for 5 days) Patient GFR is 37, Disp: 30 tablet, Rfl: 0  Review of Systems:  Negative unless indicated in HPI.   Physical Exam: Vitals:   08/08/23 1507  BP: 130/70  Pulse: 76  Temp: 98.3 F (36.8 C)  TempSrc: Oral  SpO2: 99%  Weight: 197 lb 12.8 oz (89.7 kg)    Body mass index is 36.77 kg/m.   Physical Exam Vitals reviewed.  Constitutional:      Appearance: Normal appearance.  HENT:     Right Ear: Tympanic membrane, ear canal and external ear normal.     Left Ear: Tympanic membrane, ear canal and external ear normal.     Mouth/Throat:     Mouth: Mucous membranes are moist.     Pharynx: Oropharynx is clear.  Eyes:     Conjunctiva/sclera: Conjunctivae normal.     Pupils: Pupils are equal, round, and reactive to light.  Cardiovascular:     Rate and Rhythm: Normal rate and regular rhythm.  Pulmonary:     Effort: Pulmonary effort is normal.     Breath sounds: Normal breath sounds.  Neurological:     Mental Status: She is alert.      Impression and Plan:  Cough, unspecified type -     POC COVID-19 BinaxNow -     POCT Influenza A/B  Headache after cough -     POC COVID-19 BinaxNow -     POCT Influenza A/B  COVID-19 -     nirmatrelvir/ritonavir; Take 3 tablets by mouth 2 (two) times daily for 5 days. (Take nirmatrelvir 150 mg two tablets twice daily for 5 days and ritonavir 100 mg one tablet twice daily for 5 days) Patient GFR is 37  Dispense: 30 tablet; Refill: 0   -In office COVID test is positive. -Given her age I will go ahead and prescribe Paxlovid.  Time spent:21 minutes reviewing chart, interviewing and examining patient and formulating plan of care.     Chaya Jan, MD [facility]

## 2023-08-10 ENCOUNTER — Other Ambulatory Visit: Payer: Self-pay | Admitting: Cardiovascular Disease

## 2023-08-26 ENCOUNTER — Encounter: Payer: Self-pay | Admitting: Adult Health

## 2023-08-26 ENCOUNTER — Other Ambulatory Visit: Payer: Self-pay | Admitting: Adult Health

## 2023-08-26 ENCOUNTER — Other Ambulatory Visit: Payer: Medicare Other

## 2023-08-26 DIAGNOSIS — E039 Hypothyroidism, unspecified: Secondary | ICD-10-CM | POA: Diagnosis not present

## 2023-08-26 LAB — TSH: TSH: 1.93 u[IU]/mL (ref 0.35–5.50)

## 2023-08-26 MED ORDER — LEVOTHYROXINE SODIUM 125 MCG PO TABS: 125.0000 ug | ORAL_TABLET | Freq: Every day | ORAL | 3 refills | Status: DC

## 2023-08-29 ENCOUNTER — Other Ambulatory Visit: Payer: Medicare Other

## 2023-10-19 ENCOUNTER — Encounter: Payer: Self-pay | Admitting: Cardiovascular Disease

## 2023-11-11 ENCOUNTER — Other Ambulatory Visit: Payer: Self-pay | Admitting: Thoracic Surgery (Cardiothoracic Vascular Surgery)

## 2023-11-11 DIAGNOSIS — R931 Abnormal findings on diagnostic imaging of heart and coronary circulation: Secondary | ICD-10-CM

## 2023-11-11 DIAGNOSIS — Z951 Presence of aortocoronary bypass graft: Secondary | ICD-10-CM

## 2023-11-23 ENCOUNTER — Ambulatory Visit (HOSPITAL_COMMUNITY)
Admission: RE | Admit: 2023-11-23 | Discharge: 2023-11-23 | Disposition: A | Discharge status: Home / Self Care | Source: Ambulatory Visit | Attending: Thoracic Surgery (Cardiothoracic Vascular Surgery) | Admitting: Thoracic Surgery (Cardiothoracic Vascular Surgery)

## 2023-11-23 DIAGNOSIS — I700 Atherosclerosis of aorta: Secondary | ICD-10-CM | POA: Diagnosis not present

## 2023-11-23 DIAGNOSIS — Z951 Presence of aortocoronary bypass graft: Secondary | ICD-10-CM | POA: Insufficient documentation

## 2023-11-23 DIAGNOSIS — R931 Abnormal findings on diagnostic imaging of heart and coronary circulation: Secondary | ICD-10-CM | POA: Insufficient documentation

## 2023-12-16 ENCOUNTER — Encounter: Payer: Self-pay | Admitting: Adult Health

## 2023-12-26 NOTE — Progress Notes (Signed)
 [address]       [place] [address]             [phone]                    Hannah Morrow Date of Birth: Apr 19, 1941  Referring: Merna Huxley, NP Primary Care: Merna Huxley, NP Primary Cardiologist: None  Chief Complaint:    Chief Complaint  Patient presents with   Incisional Hernia    S/p CABG 2020, chest CT 5/21    History of Present Illness:    Hannah Morrow 83 y.o. female presents for surgical evaluation of an incisional hernia.  She underwent CABG by Dr. Army in 2020.  Lately, she has noted some bulging from the lower part of her incision.  She denies any severe pain, or bowel obstructions.  Occasionally, she feels like she needs to push things back in.    Past Medical History:  Diagnosis Date   Allergy    no meds   ANXIETY    Cancer (HCC)    cervical cancer - hysterectomy   GERD    Hyperlipidemia    Hypertension    Hypothyroid    Plantar fasciitis    right   RESTLESS LEGS SYNDROME     Past Surgical History:  Procedure Laterality Date   ABDOMINAL HYSTERECTOMY     BREAST SURGERY     Bilateral breast reduction   COLOSTOMY  04-22-2010   [facility]   cone bx     cervix   CORONARY ARTERY BYPASS GRAFT N/A 05/28/2019   Procedure: CORONARY ARTERY BYPASS GRAFTING (CABG) using the LIMA to LAD; Endoscopic right saphenous vein harvesting to the Ramus and Diag1.;  Surgeon: Army Dallas NOVAK, MD;  Location: [facility] OR;  Service: Open Heart Surgery;  Laterality: N/A;   LEFT HEART CATH AND CORONARY ANGIOGRAPHY N/A 05/25/2019   Procedure: LEFT HEART CATH AND CORONARY ANGIOGRAPHY;  Surgeon: Anner Alm ORN, MD;  Location: [facility] INVASIVE CV LAB;  Service: Cardiovascular;  Laterality: N/A;   REDUCTION MAMMAPLASTY Bilateral    TEE WITHOUT CARDIOVERSION N/A 05/28/2019   Procedure: TRANSESOPHAGEAL ECHOCARDIOGRAM (TEE);  Surgeon: Army Dallas NOVAK, MD;  Location: [facility] OR;  Service: Open Heart  Surgery;  Laterality: N/A;   WISDOM TOOTH EXTRACTION      Family History  Problem Relation Age of Onset   Cancer Sister        melanoma- with mets   Colon cancer Father 76   Stomach cancer Maternal Grandfather    Breast cancer Mother 64   Breast cancer Daughter 46   Rectal cancer Neg Hx    Esophageal cancer Neg Hx      Social History   Tobacco Use  Smoking Status Never  Smokeless Tobacco Never    Social History   Substance and Sexual Activity  Alcohol Use Yes   Alcohol/week: 5.0 standard drinks of alcohol   Types: 5 Glasses of wine per week   Comment: Occasional     Allergies  Allergen Reactions   Penicillins Shortness Of Breath    red face Did it involve swelling of the face/tongue/throat, SOB, or low BP? Yes Did it involve sudden or severe rash/hives, skin peeling, or any reaction on the inside of your mouth or nose? Yes Did you need to seek medical attention at a hospital or doctor's office? Yes When did it last happen?      40+ years  If all above answers are "NO", may proceed with cephalosporin use.    Coreg  [Carvedilol ] Rash   Amlodipine      Pt states it made her feel awful and weird.   Metoprolol  Other (See Comments)     feel weird   Betadine [Povidone Iodine] Rash   Hydrochlorothiazide  Other (See Comments)    Fatigue    Hydrocodone-Acetaminophen  Rash    ras   Lisinopril      Fatigue    Tramadol  Itching and Rash    Current Outpatient Medications  Medication Sig Dispense Refill   Ascorbic Acid (VITAMIN C) 1000 MG tablet Take 1,000 mg by mouth daily.     aspirin  81 MG tablet Take 1 tablet (81 mg total) by mouth daily.     atenolol  (TENORMIN ) 50 MG tablet TAKE 1 TABLET DAILY (TO REPLACE 25 MG DOSE) 90 tablet 3   atorvastatin  (LIPITOR ) 40 MG tablet TAKE 1 TABLET DAILY 90 tablet 3   Cholecalciferol (VITAMIN D ) 2000 UNITS CAPS Take by mouth.     fluocinonide  ointment (LIDEX ) 0.05 % Apply 1 application topically daily as needed (itching). 60 g 1    ibuprofen  (ADVIL ) 200 MG tablet Take 400 mg by mouth in the morning and at bedtime.     levothyroxine  (SYNTHROID ) 125 MCG tablet Take 1 tablet (125 mcg total) by mouth daily. 90 tablet 3   losartan  (COZAAR ) 100 MG tablet TAKE 1 TABLET DAILY 90 tablet 3   No current facility-administered medications for this visit.    Review of Systems  Constitutional:  Negative for malaise/fatigue.  Respiratory:  Negative for shortness of breath.   Cardiovascular:  Negative for chest pain.  Gastrointestinal:  Negative for abdominal pain, heartburn, nausea and vomiting.  Neurological: Negative.     PHYSICAL EXAMINATION: BP (!) 189/78 (BP Location: Left Arm, Patient Position: Sitting, Cuff Size: Normal)   Pulse 71   Resp 20   Ht 5' 1.5 (1.562 m)   Wt 200 lb 12.8 oz (91.1 kg)   SpO2 95% Comment: RA  BMI 37.33 kg/m   Physical Exam Constitutional:      General: She is not in acute distress.    Appearance: She is not ill-appearing.  HENT:     Head: Normocephalic and atraumatic.   Cardiovascular:     Rate and Rhythm: Normal rate.  Pulmonary:     Effort: Pulmonary effort is normal. No respiratory distress.  Abdominal:     Comments: Palpable hernia   Musculoskeletal:     Cervical back: Normal range of motion.   Skin:    General: Skin is warm and dry.   Neurological:     General: No focal deficit present.     Mental Status: She is alert and oriented to person, place, and time.      Diagnostic Studies & Laboratory data:     Recent Radiology Findings:   No results found.     I have independently reviewed the above radiology studies  and reviewed the findings with the patient.   Recent Lab Findings: Lab Results  Component Value Date   WBC 6.3 07/22/2023   HGB 13.9 07/22/2023   HCT 43.8 07/22/2023   PLT 213.0 07/22/2023   GLUCOSE 118 (H) 07/22/2023   CHOL 153 07/22/2023   TRIG 136.0 07/22/2023   HDL 52.40 07/22/2023   LDLDIRECT 99.0 02/03/2021   LDLCALC 73 07/22/2023   ALT  18 07/22/2023   AST 24 07/22/2023   NA 140 07/22/2023   K 4.5 07/22/2023  CL 104 07/22/2023   CREATININE 1.33 (H) 07/22/2023   BUN 30 (H) 07/22/2023   CO2 28 07/22/2023   TSH 1.93 08/26/2023   INR 1.4 (H) 05/28/2019   HGBA1C 5.7 04/09/2022       Assessment / Plan:   83yo female s/p CABG in 2020, now with an incisional hernia.  We discussed the options for repair, but she states that given her age, she would not want to undergo another surgery.  She will continue to follow this.  Based on the cross-sectional imaging, it is very high on her epigastrium, and unlikely to involve any bowel.  Warning signs for incarceration were discussed.        Hannah Morrow 01/02/2024 7:52 AM

## 2023-12-30 ENCOUNTER — Ambulatory Visit
Attending: Thoracic Surgery (Cardiothoracic Vascular Surgery) | Admitting: Thoracic Surgery (Cardiothoracic Vascular Surgery)

## 2023-12-30 VITALS — BP 189/78 | HR 71 | Resp 20 | Ht 61.5 in | Wt 200.8 lb

## 2023-12-30 DIAGNOSIS — Z951 Presence of aortocoronary bypass graft: Secondary | ICD-10-CM | POA: Insufficient documentation

## 2024-01-13 ENCOUNTER — Ambulatory Visit (INDEPENDENT_AMBULATORY_CARE_PROVIDER_SITE_OTHER): Payer: Medicare Other

## 2024-01-13 VITALS — Ht 62.0 in | Wt 197.0 lb

## 2024-01-13 DIAGNOSIS — Z0000 Encounter for general adult medical examination without abnormal findings: Secondary | ICD-10-CM

## 2024-01-13 NOTE — Progress Notes (Signed)
 Subjective:   Hannah Morrow is a 83 y.o. who presents for a Medicare Wellness preventive visit.  As a reminder, Annual Wellness Visits don't include a physical exam, and some assessments may be limited, especially if this visit is performed virtually. We may recommend an in-person follow-up visit with your provider if needed.  Visit Complete: Virtual I connected with  [name] on 01/13/24 by a audio enabled telemedicine application and verified that I am speaking with the correct person using two identifiers.  Patient Location: Home  Provider Location: Home Office  I discussed the limitations of evaluation and management by telemedicine. The patient expressed understanding and agreed to proceed.  Vital Signs: Because this visit was a virtual/telehealth visit, some criteria may be missing or patient reported. Any vitals not documented were not able to be obtained and vitals that have been documented are patient reported.    Persons Participating in Visit: Patient.  AWV Questionnaire: Yes: Patient Medicare AWV questionnaire was completed by the patient on 01/09/24; I have confirmed that all information answered by patient is correct and no changes since this date.  Cardiac Risk Factors include: advanced age (>47men, >51 women);dyslipidemia;hypertension     Objective:    Today's Vitals   01/13/24 1044  Weight: 197 lb (89.4 kg)  Height: 5' 2 (1.575 m)   Body mass index is 36.03 kg/m.     01/13/2024   10:52 AM 01/10/2023   10:55 AM 09/29/2022    2:03 PM 05/11/2022    7:28 PM 12/30/2021   11:35 AM 12/25/2020    1:16 PM 12/18/2020   11:31 AM  Advanced Directives  Does Patient Have a Medical Advance Directive? Yes Yes No Yes Yes Yes Yes  Type of Estate agent of [place];Living will Healthcare Power of [place];Living will  Living will Healthcare Power of [place];Living will Healthcare Power of [place];Living will Healthcare Power of  [place];Living will  Does patient want to make changes to medical advance directive?    No - Patient declined No - Patient declined  No - Patient declined  Copy of Healthcare Power of Attorney in Chart? No - copy requested No - copy requested   No - copy requested No - copy requested No - copy requested  Would patient like information on creating a medical advance directive?   No - Patient declined        Current Medications (verified) Outpatient Encounter Medications as of 01/13/2024  Medication Sig   Ascorbic Acid (VITAMIN C) 1000 MG tablet Take 1,000 mg by mouth daily.   aspirin  81 MG tablet Take 1 tablet (81 mg total) by mouth daily.   atenolol  (TENORMIN ) 50 MG tablet TAKE 1 TABLET DAILY (TO REPLACE 25 MG DOSE)   atorvastatin  (LIPITOR ) 40 MG tablet TAKE 1 TABLET DAILY   Cholecalciferol (VITAMIN D ) 2000 UNITS CAPS Take by mouth.   fluocinonide  ointment (LIDEX ) 0.05 % Apply 1 application topically daily as needed (itching).   ibuprofen  (ADVIL ) 200 MG tablet Take 400 mg by mouth in the morning and at bedtime.   levothyroxine  (SYNTHROID ) 125 MCG tablet Take 1 tablet (125 mcg total) by mouth daily.   losartan  (COZAAR ) 100 MG tablet TAKE 1 TABLET DAILY   No facility-administered encounter medications on file as of 01/13/2024.    Allergies (verified) Penicillins, Coreg  [carvedilol ], Amlodipine , Metoprolol , Betadine [povidone iodine], Hydrochlorothiazide , Hydrocodone-acetaminophen , Lisinopril , and Tramadol    History: Past Medical History:  Diagnosis Date   Allergy    no meds  ANXIETY    Cancer (HCC)    cervical cancer - hysterectomy   GERD    Hyperlipidemia    Hypertension    Hypothyroid    Plantar fasciitis    right   RESTLESS LEGS SYNDROME    Past Surgical History:  Procedure Laterality Date   ABDOMINAL HYSTERECTOMY     BREAST SURGERY     Bilateral breast reduction   COLOSTOMY  04-22-2010   [facility]   cone bx     cervix   CORONARY ARTERY BYPASS GRAFT N/A 05/28/2019    Procedure: CORONARY ARTERY BYPASS GRAFTING (CABG) using the LIMA to LAD; Endoscopic right saphenous vein harvesting to the Ramus and Diag1.;  Surgeon: Army Dallas NOVAK, MD;  Location: [facility] OR;  Service: Open Heart Surgery;  Laterality: N/A;   LEFT HEART CATH AND CORONARY ANGIOGRAPHY N/A 05/25/2019   Procedure: LEFT HEART CATH AND CORONARY ANGIOGRAPHY;  Surgeon: Anner Alm ORN, MD;  Location: [facility] INVASIVE CV LAB;  Service: Cardiovascular;  Laterality: N/A;   REDUCTION MAMMAPLASTY Bilateral    TEE WITHOUT CARDIOVERSION N/A 05/28/2019   Procedure: TRANSESOPHAGEAL ECHOCARDIOGRAM (TEE);  Surgeon: Army Dallas NOVAK, MD;  Location: [facility] OR;  Service: Open Heart Surgery;  Laterality: N/A;   WISDOM TOOTH EXTRACTION     Family History  Problem Relation Age of Onset   Cancer Sister        melanoma- with mets   Colon cancer Father 66   Stomach cancer Maternal Grandfather    Breast cancer Mother 62   Breast cancer Daughter 31   Rectal cancer Neg Hx    Esophageal cancer Neg Hx    Social History   Socioeconomic History   Marital status: Widowed    Spouse name: Not on file   Number of children: 2   Years of education: Not on file   Highest education level: Some college, no degree  Occupational History   Not on file  Tobacco Use   Smoking status: Never   Smokeless tobacco: Never  Vaping Use   Vaping status: Not on file  Substance and Sexual Activity   Alcohol use: Yes    Alcohol/week: 5.0 standard drinks of alcohol    Types: 5 Glasses of wine per week    Comment: Occasional   Drug use: No   Sexual activity: Yes    Partners: Male    Birth control/protection: Surgical, Other-see comments    Comment: hysterectomy  Other Topics Concern   Not on file  Social History Narrative   Married for 35 years   Two daughters, five grand children and 5 great grand children. Daughter lives in [place]. Other daughter lives in [place].    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    Exercise Does not exercise. Was going to exercise class but has since quite.    Diet: Veggies, chicken, salmon. Very little red meat. Eats lots of eggs.    Exercise --- walking , gym 3 classes a week   Social Drivers of Health   Financial Resource Strain: Low Risk  (01/13/2024)   Overall Financial Resource Strain (CARDIA)    Difficulty of Paying Living Expenses: Not hard at all  Food Insecurity: No Food Insecurity (01/13/2024)   Hunger Vital Sign    Worried About Running Out of Food in the Last Year: Never true    Ran Out of Food in the Last Year: Never true  Transportation  Needs: No Transportation Needs (01/13/2024)   PRAPARE - Administrator, Civil Service (Medical): No    Lack of Transportation (Non-Medical): No  Physical Activity: Insufficiently Active (01/13/2024)   Exercise Vital Sign    Days of Exercise per Week: 4 days    Minutes of Exercise per Session: 10 min  Stress: No Stress Concern Present (01/13/2024)   [employer] of Occupational Health - Occupational Stress Questionnaire    Feeling of Stress: Not at all  Social Connections: Moderately Integrated (01/13/2024)   Social Connection and Isolation Panel    Frequency of Communication with Friends and Family: More than three times a week    Frequency of Social Gatherings with Friends and Family: Once a week    Attends Religious Services: More than 4 times per year    Active Member of [employer] or Organizations: Yes    Attends Banker Meetings: More than 4 times per year    Marital Status: Widowed    Tobacco Counseling Counseling given: Not Answered    Clinical Intake:  Pre-visit preparation completed: Yes  Pain : No/denies pain     BMI - recorded: 36.03 Nutritional Status: BMI > 30  Obese Nutritional Risks: None Diabetes: No  Lab Results  Component Value Date   HGBA1C 5.7 04/09/2022   HGBA1C 5.7 02/03/2021   HGBA1C 5.5 05/27/2019     How often do  you need to have someone help you when you read instructions, pamphlets, or other written materials from your doctor or pharmacy?: 1 - Never  Interpreter Needed?: No  Information entered by :: Rojelio Blush LPN   Activities of Daily Living     01/13/2024   10:50 AM 01/09/2024    9:08 AM  In your present state of health, do you have any difficulty performing the following activities:  Hearing? 0 0  Vision? 0 0  Difficulty concentrating or making decisions? 0 0  Walking or climbing stairs? 0 0  Dressing or bathing? 0 0  Doing errands, shopping? 0 0  Preparing Food and eating ? N N  Using the Toilet? N N  In the past six months, have you accidently leaked urine? N Y  Do you have problems with loss of bowel control? N N  Managing your Medications? N N  Managing your Finances? N N  Housekeeping or managing your Housekeeping? N N    Patient Care Team: Merna Huxley, NP as PCP - General (Family Medicine) Lynnell Nottingham, MD as Consulting Physician (Dermatology) Court Dorn PARAS, MD as Consulting Physician (Cardiology)  I have updated your Care Teams any recent Medical Services you may have received from other providers in the past year.     Assessment:   This is a routine wellness examination for Matika.  Hearing/Vision screen Hearing Screening - Comments:: Denies hearing difficulties   Vision Screening - Comments:: Wears rx glasses - up to date with routine eye exams with  Dr Charmayne   Goals Addressed               This Visit's Progress     Increase physical activity (pt-stated)        Remain active.       Depression Screen     01/13/2024   10:48 AM 06/21/2023    2:31 PM 04/19/2023    2:25 PM 01/10/2023   10:51 AM 10/07/2022   10:48 AM 04/09/2022   11:26 AM 02/12/2022   10:52 AM  PHQ 2/9 Scores  PHQ - 2 Score 0 0  0 2 0 1  PHQ- 9 Score  0   9 1   Exception Documentation   Patient refusal        Fall Risk     01/13/2024   10:51 AM 01/09/2024    9:08 AM  07/22/2023    9:38 AM 01/10/2023   10:54 AM 10/03/2022    8:46 AM  Fall Risk   Falls in the past year? 0 0 0 0 0  Number falls in past yr: 0  0 0   Injury with Fall? 0 0 0 0   Risk for fall due to : No Fall Risks  No Fall Risks No Fall Risks   Follow up Falls evaluation completed  Falls evaluation completed Falls prevention discussed     MEDICARE RISK AT HOME:  Medicare Risk at Home Any stairs in or around the home?: Yes If so, are there any without handrails?: No Home free of loose throw rugs in walkways, pet beds, electrical cords, etc?: Yes Adequate lighting in your home to reduce risk of falls?: Yes Life alert?: No Use of a cane, walker or w/c?: No Grab bars in the bathroom?: Yes Shower chair or bench in shower?: Yes Elevated toilet seat or a handicapped toilet?: Yes  TIMED UP AND GO:  Was the test performed?  No  Cognitive Function: 6CIT completed    11/02/2017   11:46 AM  MMSE - Mini Mental State Exam  Not completed: --        01/13/2024   10:51 AM 01/10/2023   10:55 AM 12/30/2021   11:36 AM  6CIT Screen  What Year? 0 points 0 points 0 points  What month? 0 points 0 points 0 points  What time? 0 points 0 points 0 points  Count back from 20 0 points 0 points 0 points  Months in reverse 0 points 0 points 0 points  Repeat phrase 0 points 0 points 0 points  Total Score 0 points 0 points 0 points    Immunizations Immunization History  Administered Date(s) Administered   Fluad Quad(high Dose 65+) 03/29/2022   Influenza, High Dose Seasonal PF 05/19/2015, 05/14/2016, 04/05/2017, 04/18/2018   Influenza,inj,Quad PF,6+ Mos 05/13/2014   Influenza-Unspecified 04/11/2020, 03/29/2021, 03/04/2023   PFIZER(Purple Top)SARS-COV-2 Vaccination 07/27/2019, 08/14/2019, 03/29/2021   Pfizer Covid-19 Vaccine Bivalent Booster 60yrs & up 04/22/2020   Pfizer(Comirnaty)Fall Seasonal Vaccine 12 years and older 03/04/2023   Pneumococcal Conjugate-13 05/13/2014   Pneumococcal  Polysaccharide-23 04/23/2013   Td 12/10/2008, 03/29/2022   Zoster, Live 10/06/2012    Screening Tests Health Maintenance  Topic Date Due   Zoster Vaccines- Shingrix (1 of 2) 03/23/1992   COVID-19 Vaccine (6 - 2024-25 season) 09/02/2023   INFLUENZA VACCINE  02/03/2024   MAMMOGRAM  04/10/2024   Medicare Annual Wellness (AWV)  01/12/2025   DTaP/Tdap/Td (3 - Tdap) 03/29/2032   Pneumococcal Vaccine: 50+ Years  Completed   DEXA SCAN  Completed   Hepatitis B Vaccines  Aged Out   HPV VACCINES  Aged Out   Meningococcal B Vaccine  Aged Out   Colonoscopy  Discontinued   Hepatitis C Screening  Discontinued    Health Maintenance  Health Maintenance Due  Topic Date Due   Zoster Vaccines- Shingrix (1 of 2) 03/23/1992   COVID-19 Vaccine (6 - 2024-25 season) 09/02/2023   Health Maintenance Items Addressed:   Additional Screening:  Vision Screening: Recommended annual ophthalmology exams for early detection of glaucoma and other disorders  of the eye. Would you like a referral to an eye doctor? No    Dental Screening: Recommended annual dental exams for proper oral hygiene  Community Resource Referral / Chronic Care Management: CRR required this visit?  No   CCM required this visit?  No   Plan:    I have personally reviewed and noted the following in the patient's chart:   Medical and social history Use of alcohol, tobacco or illicit drugs  Current medications and supplements including opioid prescriptions. Patient is not currently taking opioid prescriptions. Functional ability and status Nutritional status Physical activity Advanced directives List of other physicians Hospitalizations, surgeries, and ER visits in previous 12 months Vitals Screenings to include cognitive, depression, and falls Referrals and appointments  In addition, I have reviewed and discussed with patient certain preventive protocols, quality metrics, and best practice recommendations. A written  personalized care plan for preventive services as well as general preventive health recommendations were provided to patient.   Rojelio LELON Blush, LPN   [id number]/88/[id number]   After Visit Summary: (MyChart) Due to this being a telephonic visit, the after visit summary with patients personalized plan was offered to patient via MyChart   Notes: Nothing significant to report at this time.

## 2024-01-13 NOTE — Patient Instructions (Signed)
 Hannah Morrow , Thank you for taking time out of your busy schedule to complete your Annual Wellness Visit with me. I enjoyed our conversation and look forward to speaking with you again next year. I, as well as your care team,  appreciate your ongoing commitment to your health goals. Please review the following plan we discussed and let me know if I can assist you in the future. Your Game plan/ To Do List    Referrals: If you haven't heard from the office you've been referred to, please reach out to them at the phone provided.   Follow up Visits: Next Medicare AWV with our clinical staff: 01/18/25 @ 10:40a   Have you seen your provider in the last 6 months (3 months if uncontrolled diabetes)? 07/22/23 Next Office Visit with your provider:   Clinician Recommendations:  Aim for 30 minutes of exercise or brisk walking, 6-8 glasses of water, and 5 servings of fruits and vegetables each day.       This is a list of the screening recommended for you and due dates:  Health Maintenance  Topic Date Due   Zoster (Shingles) Vaccine (1 of 2) 03/23/1992   COVID-19 Vaccine (6 - 2024-25 season) 09/02/2023   Flu Shot  02/03/2024   Mammogram  04/10/2024   Medicare Annual Wellness Visit  01/12/2025   DTaP/Tdap/Td vaccine (3 - Tdap) 03/29/2032   Pneumococcal Vaccine for age over 68  Completed   DEXA scan (bone density measurement)  Completed   Hepatitis B Vaccine  Aged Out   HPV Vaccine  Aged Out   Meningitis B Vaccine  Aged Out   Colon Cancer Screening  Discontinued   Hepatitis C Screening  Discontinued    Advanced directives: (Copy Requested) Please bring a copy of your health care power of attorney and living will to the office to be added to your chart at your convenience. You can mail to [facility] [address] [place], [place] [address] or email to [email or web address] Advance Care Planning is important because it:  [x]  Makes sure you receive the medical care that is  consistent with your values, goals, and preferences  [x]  It provides guidance to your family and loved ones and reduces their decisional burden about whether or not they are making the right decisions based on your wishes.  Follow the link provided in your after visit summary or read over the paperwork we have mailed to you to help you started getting your Advance Directives in place. If you need assistance in completing these, please reach out to us  so that we can help you!  See attachments for Preventive Care and Fall Prevention Tips.

## 2024-01-27 ENCOUNTER — Other Ambulatory Visit: Payer: Self-pay | Admitting: Adult Health

## 2024-02-08 ENCOUNTER — Ambulatory Visit: Admitting: Adult Health

## 2024-02-08 ENCOUNTER — Encounter: Payer: Self-pay | Admitting: Adult Health

## 2024-02-08 ENCOUNTER — Ambulatory Visit: Payer: Self-pay | Admitting: *Deleted

## 2024-02-08 ENCOUNTER — Other Ambulatory Visit: Payer: Self-pay | Admitting: Adult Health

## 2024-02-08 ENCOUNTER — Telehealth: Payer: Self-pay | Admitting: *Deleted

## 2024-02-08 VITALS — BP 130/88 | HR 64 | Temp 97.9°F | Wt 202.8 lb

## 2024-02-08 DIAGNOSIS — J40 Bronchitis, not specified as acute or chronic: Secondary | ICD-10-CM

## 2024-02-08 MED ORDER — PREDNISONE 10 MG PO TABS: ORAL_TABLET | ORAL | 0 refills | Status: DC

## 2024-02-08 NOTE — Telephone Encounter (Signed)
 Patient is aware

## 2024-02-08 NOTE — Progress Notes (Signed)
 Subjective:    Patient ID: Hannah Morrow, female    DOB: 1941-05-09, 83 y.o.   MRN: [id number]  Cough    83 year old female who  has a past medical history of Allergy, ANXIETY, Cancer (HCC), GERD, Hyperlipidemia, Hypertension, Hypothyroid, Plantar fasciitis, and RESTLESS LEGS SYNDROME.  She presents to the office today for an acute issue. She reports that three days ago she developed wheezing and a dry cough. She denies shortness of breath, decreased exercise tolerance, chest pain or shortness of breath. She feels better today   Review of Systems See HPI   Past Medical History:  Diagnosis Date   Allergy    no meds   ANXIETY    Cancer (HCC)    cervical cancer - hysterectomy   GERD    Hyperlipidemia    Hypertension    Hypothyroid    Plantar fasciitis    right   RESTLESS LEGS SYNDROME     Social History   Socioeconomic History   Marital status: Widowed    Spouse name: Not on file   Number of children: 2   Years of education: Not on file   Highest education level: Some college, no degree  Occupational History   Not on file  Tobacco Use   Smoking status: Never   Smokeless tobacco: Never  Vaping Use   Vaping status: Not on file  Substance and Sexual Activity   Alcohol use: Yes    Alcohol/week: 5.0 standard drinks of alcohol    Types: 5 Glasses of wine per week    Comment: Occasional   Drug use: No   Sexual activity: Yes    Partners: Male    Birth control/protection: Surgical, Other-see comments    Comment: hysterectomy  Other Topics Concern   Not on file  Social History Narrative   Married for 35 years   Two daughters, five grand children and 5 great grand children. Daughter lives in [place]. Other daughter lives in [place].    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    Exercise Does not exercise. Was going to exercise class but has since quite.    Diet: Veggies, chicken, salmon. Very little red  meat. Eats lots of eggs.    Exercise --- walking , gym 3 classes a week   Social Drivers of Health   Financial Resource Strain: Low Risk  (01/13/2024)   Overall Financial Resource Strain (CARDIA)    Difficulty of Paying Living Expenses: Not hard at all  Food Insecurity: No Food Insecurity (01/13/2024)   Hunger Vital Sign    Worried About Running Out of Food in the Last Year: Never true    Ran Out of Food in the Last Year: Never true  Transportation Needs: No Transportation Needs (01/13/2024)   PRAPARE - Administrator, Civil Service (Medical): No    Lack of Transportation (Non-Medical): No  Physical Activity: Insufficiently Active (01/13/2024)   Exercise Vital Sign    Days of Exercise per Week: 4 days    Minutes of Exercise per Session: 10 min  Stress: No Stress Concern Present (01/13/2024)   [employer] of Occupational Health - Occupational Stress Questionnaire    Feeling of Stress: Not at all  Social Connections: Moderately Integrated (01/13/2024)   Social Connection and Isolation Panel    Frequency of Communication with Friends and Family: More than three times a week  Frequency of Social Gatherings with Friends and Family: Once a week    Attends Religious Services: More than 4 times per year    Active Member of [employer] or Organizations: Yes    Attends Banker Meetings: More than 4 times per year    Marital Status: Widowed  Intimate Partner Violence: Not At Risk (01/13/2024)   Humiliation, Afraid, Rape, and Kick questionnaire    Fear of Current or Ex-Partner: No    Emotionally Abused: No    Physically Abused: No    Sexually Abused: No    Past Surgical History:  Procedure Laterality Date   ABDOMINAL HYSTERECTOMY     BREAST SURGERY     Bilateral breast reduction   COLOSTOMY  04-22-2010   [facility]   cone bx     cervix   CORONARY ARTERY BYPASS GRAFT N/A 05/28/2019   Procedure: CORONARY ARTERY BYPASS GRAFTING (CABG) using the LIMA to LAD; Endoscopic  right saphenous vein harvesting to the Ramus and Diag1.;  Surgeon: Army Dallas NOVAK, MD;  Location: [facility] OR;  Service: Open Heart Surgery;  Laterality: N/A;   LEFT HEART CATH AND CORONARY ANGIOGRAPHY N/A 05/25/2019   Procedure: LEFT HEART CATH AND CORONARY ANGIOGRAPHY;  Surgeon: Anner Alm ORN, MD;  Location: [facility] INVASIVE CV LAB;  Service: Cardiovascular;  Laterality: N/A;   REDUCTION MAMMAPLASTY Bilateral    TEE WITHOUT CARDIOVERSION N/A 05/28/2019   Procedure: TRANSESOPHAGEAL ECHOCARDIOGRAM (TEE);  Surgeon: Army Dallas NOVAK, MD;  Location: [facility] OR;  Service: Open Heart Surgery;  Laterality: N/A;   WISDOM TOOTH EXTRACTION      Family History  Problem Relation Age of Onset   Cancer Sister        melanoma- with mets   Colon cancer Father 70   Stomach cancer Maternal Grandfather    Breast cancer Mother 61   Breast cancer Daughter 42   Rectal cancer Neg Hx    Esophageal cancer Neg Hx     Allergies  Allergen Reactions   Penicillins Shortness Of Breath    red face Did it involve swelling of the face/tongue/throat, SOB, or low BP? Yes Did it involve sudden or severe rash/hives, skin peeling, or any reaction on the inside of your mouth or nose? Yes Did you need to seek medical attention at a hospital or doctor's office? Yes When did it last happen?      40+ years If all above answers are "NO", may proceed with cephalosporin use.    Coreg  [Carvedilol ] Rash   Amlodipine      Pt states it made her feel awful and weird.   Metoprolol  Other (See Comments)     feel weird   Betadine [Povidone Iodine] Rash   Hydrochlorothiazide  Other (See Comments)    Fatigue    Hydrocodone-Acetaminophen  Rash    ras   Lisinopril      Fatigue    Tramadol  Itching and Rash    Current Outpatient Medications on File Prior to Visit  Medication Sig Dispense Refill   Ascorbic Acid (VITAMIN C) 1000 MG tablet Take 1,000 mg by mouth daily.     aspirin  81 MG tablet Take 1 tablet (81 mg total) by mouth daily.      atenolol  (TENORMIN ) 50 MG tablet TAKE 1 TABLET DAILY (TO REPLACE 25 MG DOSE) 90 tablet 3   atorvastatin  (LIPITOR ) 40 MG tablet TAKE 1 TABLET DAILY 90 tablet 3   Cholecalciferol (VITAMIN D ) 2000 UNITS CAPS Take by mouth.     fluocinonide  ointment (LIDEX ) 0.05 %  Apply 1 application topically daily as needed (itching). 60 g 1   ibuprofen  (ADVIL ) 200 MG tablet Take 400 mg by mouth in the morning and at bedtime.     levothyroxine  (SYNTHROID ) 125 MCG tablet Take 1 tablet (125 mcg total) by mouth daily. 90 tablet 3   losartan  (COZAAR ) 100 MG tablet TAKE 1 TABLET DAILY 90 tablet 3   No current facility-administered medications on file prior to visit.    BP 130/88 (BP Location: Left Arm, Patient Position: Sitting, Cuff Size: Large)   Pulse 64   Temp 97.9 F (36.6 C) (Oral)   Wt 202 lb 12.8 oz (92 kg)   SpO2 96%   BMI 37.09 kg/m       Objective:   Physical Exam Vitals and nursing note reviewed.  Constitutional:      Appearance: Normal appearance.  Cardiovascular:     Rate and Rhythm: Normal rate and regular rhythm.     Pulses: Normal pulses.     Heart sounds: Normal heart sounds.  Pulmonary:     Effort: Pulmonary effort is normal.     Breath sounds: Wheezing and rhonchi present.     Comments: Trace exp wheezing throughout. No rhonchi or rales noted.  Abdominal:     General: Abdomen is flat. Bowel sounds are normal.     Palpations: Abdomen is soft.  Musculoskeletal:        General: Normal range of motion.  Skin:    General: Skin is warm and dry.  Neurological:     General: No focal deficit present.     Mental Status: She is alert and oriented to person, place, and time.  Psychiatric:        Mood and Affect: Mood normal.        Behavior: Behavior normal.        Thought Content: Thought content normal.        Judgment: Judgment normal.       Assessment & Plan:  1. Bronchitis (Primary) - She sees to have a mild case on bronchitis. We discussed treatment option but she  would like to hold off at this time.  - Advised to try using a humidifier at the bedside and to keep hydrated - Follow up as needed  Wyoma Genson, NP

## 2024-02-08 NOTE — Telephone Encounter (Signed)
 Copied from CRM #[id number]. Topic: Clinical - Medication Question >> Feb 08, 2024  1:05 PM Chiquita SQUIBB wrote: Reason for CRM: Patient is calling in stating she saw Darleene today for a visit and he wanted to prescribe some medications and the patient denied, patient now would like the medication due to wheezing. Patient would like [facility] to go to [facility] on [address]. Please advise the patient.

## 2024-02-08 NOTE — Telephone Encounter (Signed)
 Copied from CRM #[id number]. Topic: Clinical - Red Word Triage >> Feb 08, 2024  8:48 AM Larissa RAMAN wrote: [facility] that prompted transfer to Nurse Triage: wheezing, dry cough Reason for Disposition  [1] MODERATE longstanding difficulty breathing (e.g., speaks in phrases, SOB even at rest, pulse 100-120) AND [2] SAME as normal  Answer Assessment - Initial Assessment Questions 1. RESPIRATORY STATUS: Describe your breathing? (e.g., wheezing, shortness of breath, unable to speak, severe coughing)      I'm wheezing and coughing since Sun.  2. ONSET: When did this breathing problem begin?      Sunday 3. PATTERN Does the difficult breathing come and go, or has it been constant since it started?      When I lay down on my right side I have a wheezing.  If turn to left side it goes away.   It's strange and annoying and loud.  4. SEVERITY: How bad is your breathing? (e.g., mild, moderate, severe)      Loud wheezing.    Dry annoying cough   5. RECURRENT SYMPTOM: Have you had difficulty breathing before? If Yes, ask: When was the last time? and What happened that time?      No 6. CARDIAC HISTORY: Do you have any history of heart disease? (e.g., heart attack, angina, bypass surgery, angioplasty)      Not asked 7. LUNG HISTORY: Do you have any history of lung disease?  (e.g., pulmonary embolus, asthma, emphysema)     No 8. CAUSE: What do you think is causing the breathing problem?      I don't know 9. OTHER SYMPTOMS: Do you have any other symptoms? (e.g., chest pain, cough, dizziness, fever, runny nose)     My nose runs all the time due to allergies.   That's normal for me. 10. O2 SATURATION MONITOR:  Do you use an oxygen saturation monitor (pulse oximeter) at home? If Yes, ask: What is your reading (oxygen level) today? What is your usual oxygen saturation reading? (e.g., 95%)       N/A 11. PREGNANCY: Is there any chance you are pregnant? When was your last menstrual  period?       N/A 12. TRAVEL: Have you traveled out of the country in the last month? (e.g., travel history, exposures)       N/A  Protocols used: Breathing Difficulty-A-AH FYI Only or Action Required?: FYI only for provider.  Patient was last seen in primary care on 08/08/2023 by Theophilus Andrews, Tully GRADE, MD.  Called Nurse Triage reporting Wheezing. And non productive cough since Sun.     Symptoms began several days ago. Since Sun.  Interventions attempted: Nothing.  Symptoms are: gradually worsening.  Triage Disposition: See PCP When Office is Open (Within 3 Days)  Patient/caregiver understands and will follow disposition?: Yes

## 2024-02-20 ENCOUNTER — Encounter: Payer: Self-pay | Admitting: Adult Health

## 2024-02-21 NOTE — Telephone Encounter (Signed)
 Please advise if pt needs another appt. For evaluation

## 2024-02-27 ENCOUNTER — Other Ambulatory Visit: Payer: Self-pay | Admitting: Adult Health

## 2024-02-29 ENCOUNTER — Encounter: Payer: Self-pay | Admitting: Adult Health

## 2024-02-29 ENCOUNTER — Ambulatory Visit (INDEPENDENT_AMBULATORY_CARE_PROVIDER_SITE_OTHER): Admitting: Adult Health

## 2024-02-29 VITALS — BP 138/84 | HR 63 | Temp 98.1°F | Ht 62.0 in | Wt 201.0 lb

## 2024-02-29 DIAGNOSIS — J988 Other specified respiratory disorders: Secondary | ICD-10-CM

## 2024-02-29 MED ORDER — DOXYCYCLINE HYCLATE 100 MG PO CAPS: 100.0000 mg | ORAL_CAPSULE | Freq: Two times a day (BID) | ORAL | 0 refills | Status: AC

## 2024-02-29 NOTE — Progress Notes (Signed)
 Subjective:    Patient ID: Hannah Morrow, female    DOB: 09-Jun-1941, 83 y.o.   MRN: [id number]  HPI 83 year old female who  has a past medical history of Allergy, ANXIETY, Cancer (HCC), GERD, Hyperlipidemia, Hypertension, Hypothyroid, Plantar fasciitis, and RESTLESS LEGS SYNDROME.  She presents to the office today for follow-up.  She was seen 3 weeks ago for 3-day history of wheezing and dry cough.  At this time she denies shortness of breath or decreased exercise tolerance and was not experiencing any chest pain.  He was prescribed a course of prednisone  for suspected bronchitis.  She reports that she continues to have cough that is now somewhat productive and when she does cough stuff up it is green.  When she woke up this morning she felt a little bit better until she started moving around and then the cough came back.  She also has fatigue and the overall sensation of not feeling well.  She has not had any fevers or chills.  She does not feel as though the prednisone  helped with her symptoms but she is no longer wheezing.  She has been using over-the-counter [place] DM with some relief during the day.  Review of Systems See HPI   Past Medical History:  Diagnosis Date   Allergy    no meds   ANXIETY    Cancer (HCC)    cervical cancer - hysterectomy   GERD    Hyperlipidemia    Hypertension    Hypothyroid    Plantar fasciitis    right   RESTLESS LEGS SYNDROME     Social History   Socioeconomic History   Marital status: Widowed    Spouse name: Not on file   Number of children: 2   Years of education: Not on file   Highest education level: Some college, no degree  Occupational History   Not on file  Tobacco Use   Smoking status: Never   Smokeless tobacco: Never  Vaping Use   Vaping status: Not on file  Substance and Sexual Activity   Alcohol use: Yes    Alcohol/week: 5.0 standard drinks of alcohol    Types: 5 Glasses of wine per week    Comment: Occasional    Drug use: No   Sexual activity: Yes    Partners: Male    Birth control/protection: Surgical, Other-see comments    Comment: hysterectomy  Other Topics Concern   Not on file  Social History Narrative   Married for 35 years   Two daughters, five grand children and 5 great grand children. Daughter lives in [place]. Other daughter lives in [place].    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    Exercise Does not exercise. Was going to exercise class but has since quite.    Diet: Veggies, chicken, salmon. Very little red meat. Eats lots of eggs.    Exercise --- walking , gym 3 classes a week   Social Drivers of Health   Financial Resource Strain: Low Risk  (01/13/2024)   Overall Financial Resource Strain (CARDIA)    Difficulty of Paying Living Expenses: Not hard at all  Food Insecurity: No Food Insecurity (01/13/2024)   Hunger Vital Sign    Worried About Running Out of Food in the Last Year: Never true    Ran Out of Food in the Last Year: Never true  Transportation Needs: No Transportation Needs (  01/13/2024)   PRAPARE - Administrator, Civil Service (Medical): No    Lack of Transportation (Non-Medical): No  Physical Activity: Insufficiently Active (01/13/2024)   Exercise Vital Sign    Days of Exercise per Week: 4 days    Minutes of Exercise per Session: 10 min  Stress: No Stress Concern Present (01/13/2024)   [employer] of Occupational Health - Occupational Stress Questionnaire    Feeling of Stress: Not at all  Social Connections: Moderately Integrated (01/13/2024)   Social Connection and Isolation Panel    Frequency of Communication with Friends and Family: More than three times a week    Frequency of Social Gatherings with Friends and Family: Once a week    Attends Religious Services: More than 4 times per year    Active Member of [employer] or Organizations: Yes    Attends Banker Meetings: More than 4 times  per year    Marital Status: Widowed  Intimate Partner Violence: Not At Risk (01/13/2024)   Humiliation, Afraid, Rape, and Kick questionnaire    Fear of Current or Ex-Partner: No    Emotionally Abused: No    Physically Abused: No    Sexually Abused: No    Past Surgical History:  Procedure Laterality Date   ABDOMINAL HYSTERECTOMY     BREAST SURGERY     Bilateral breast reduction   COLOSTOMY  04-22-2010   [facility]   cone bx     cervix   CORONARY ARTERY BYPASS GRAFT N/A 05/28/2019   Procedure: CORONARY ARTERY BYPASS GRAFTING (CABG) using the LIMA to LAD; Endoscopic right saphenous vein harvesting to the Ramus and Diag1.;  Surgeon: Army Dallas NOVAK, MD;  Location: [facility] OR;  Service: Open Heart Surgery;  Laterality: N/A;   LEFT HEART CATH AND CORONARY ANGIOGRAPHY N/A 05/25/2019   Procedure: LEFT HEART CATH AND CORONARY ANGIOGRAPHY;  Surgeon: Anner Alm ORN, MD;  Location: [facility] INVASIVE CV LAB;  Service: Cardiovascular;  Laterality: N/A;   REDUCTION MAMMAPLASTY Bilateral    TEE WITHOUT CARDIOVERSION N/A 05/28/2019   Procedure: TRANSESOPHAGEAL ECHOCARDIOGRAM (TEE);  Surgeon: Army Dallas NOVAK, MD;  Location: [facility] OR;  Service: Open Heart Surgery;  Laterality: N/A;   WISDOM TOOTH EXTRACTION      Family History  Problem Relation Age of Onset   Cancer Sister        melanoma- with mets   Colon cancer Father 64   Stomach cancer Maternal Grandfather    Breast cancer Mother 64   Breast cancer Daughter 51   Rectal cancer Neg Hx    Esophageal cancer Neg Hx     Allergies  Allergen Reactions   Penicillins Shortness Of Breath    red face Did it involve swelling of the face/tongue/throat, SOB, or low BP? Yes Did it involve sudden or severe rash/hives, skin peeling, or any reaction on the inside of your mouth or nose? Yes Did you need to seek medical attention at a hospital or doctor's office? Yes When did it last happen?      40+ years If all above answers are "NO", may proceed with  cephalosporin use.    Coreg  [Carvedilol ] Rash   Amlodipine      Pt states it made her feel awful and weird.   Metoprolol  Other (See Comments)     feel weird   Betadine [Povidone Iodine] Rash   Hydrochlorothiazide  Other (See Comments)    Fatigue    Hydrocodone-Acetaminophen  Rash    ras   Lisinopril   Fatigue    Tramadol  Itching and Rash    Current Outpatient Medications on File Prior to Visit  Medication Sig Dispense Refill   Ascorbic Acid (VITAMIN C) 1000 MG tablet Take 1,000 mg by mouth daily.     aspirin  81 MG tablet Take 1 tablet (81 mg total) by mouth daily.     atenolol  (TENORMIN ) 50 MG tablet TAKE 1 TABLET DAILY (TO REPLACE 25 MG DOSE) 90 tablet 3   atorvastatin  (LIPITOR ) 40 MG tablet TAKE 1 TABLET DAILY 90 tablet 3   Cholecalciferol (VITAMIN D ) 2000 UNITS CAPS Take by mouth.     fluocinonide  ointment (LIDEX ) 0.05 % Apply 1 application topically daily as needed (itching). 60 g 1   ibuprofen  (ADVIL ) 200 MG tablet Take 400 mg by mouth in the morning and at bedtime.     levothyroxine  (SYNTHROID ) 125 MCG tablet Take 1 tablet (125 mcg total) by mouth daily. 90 tablet 3   losartan  (COZAAR ) 100 MG tablet TAKE 1 TABLET DAILY 90 tablet 3   predniSONE  (DELTASONE ) 10 MG tablet 40 mg x 3 days, 20 mg x 3 days, 10 mg x 3 days 21 tablet 0   No current facility-administered medications on file prior to visit.    BP 138/84   Pulse 63   Temp 98.1 F (36.7 C) (Oral)   Ht 5' 2 (1.575 m)   Wt 201 lb (91.2 kg)   SpO2 97%   BMI 36.76 kg/m       Objective:   Physical Exam Vitals and nursing note reviewed.  Constitutional:      Appearance: Normal appearance.  Cardiovascular:     Rate and Rhythm: Normal rate and regular rhythm.     Pulses: Normal pulses.     Heart sounds: Normal heart sounds.  Pulmonary:     Effort: Pulmonary effort is normal.     Breath sounds: Normal breath sounds.  Musculoskeletal:        General: Normal range of motion.  Skin:    General: Skin is  warm and dry.  Neurological:     General: No focal deficit present.     Mental Status: She is alert and oriented to person, place, and time.  Psychiatric:        Mood and Affect: Mood normal.        Behavior: Behavior normal.        Assessment & Plan:  1. Respiratory infection (Primary) - Will get x-ray of her chest and start her on doxycycline  since her cough is been going on for 3-1/2 weeks.  We discussed adding Tessalon  Perles but she she would rather stick with Robitussin for the time being. - DG Chest 2 View; Future - doxycycline  (VIBRAMYCIN ) 100 MG capsule; Take 1 capsule (100 mg total) by mouth 2 (two) times daily.  Dispense: 14 capsule; Refill: 0  Darleene Shape, NP

## 2024-03-02 ENCOUNTER — Ambulatory Visit (INDEPENDENT_AMBULATORY_CARE_PROVIDER_SITE_OTHER)

## 2024-03-02 ENCOUNTER — Other Ambulatory Visit

## 2024-03-02 DIAGNOSIS — I700 Atherosclerosis of aorta: Secondary | ICD-10-CM | POA: Diagnosis not present

## 2024-03-02 DIAGNOSIS — J988 Other specified respiratory disorders: Secondary | ICD-10-CM | POA: Diagnosis not present

## 2024-03-02 DIAGNOSIS — R059 Cough, unspecified: Secondary | ICD-10-CM | POA: Diagnosis not present

## 2024-03-06 ENCOUNTER — Other Ambulatory Visit: Payer: Self-pay | Admitting: Adult Health

## 2024-03-06 DIAGNOSIS — Z1231 Encounter for screening mammogram for malignant neoplasm of breast: Secondary | ICD-10-CM

## 2024-03-09 ENCOUNTER — Encounter: Payer: Self-pay | Admitting: Adult Health

## 2024-03-14 ENCOUNTER — Ambulatory Visit: Payer: Self-pay | Admitting: Adult Health

## 2024-03-21 DIAGNOSIS — L821 Other seborrheic keratosis: Secondary | ICD-10-CM | POA: Diagnosis not present

## 2024-03-21 DIAGNOSIS — D1801 Hemangioma of skin and subcutaneous tissue: Secondary | ICD-10-CM | POA: Diagnosis not present

## 2024-03-21 DIAGNOSIS — Z85828 Personal history of other malignant neoplasm of skin: Secondary | ICD-10-CM | POA: Diagnosis not present

## 2024-03-21 DIAGNOSIS — L820 Inflamed seborrheic keratosis: Secondary | ICD-10-CM | POA: Diagnosis not present

## 2024-03-21 DIAGNOSIS — L570 Actinic keratosis: Secondary | ICD-10-CM | POA: Diagnosis not present

## 2024-03-21 DIAGNOSIS — L814 Other melanin hyperpigmentation: Secondary | ICD-10-CM | POA: Diagnosis not present

## 2024-03-21 DIAGNOSIS — L720 Epidermal cyst: Secondary | ICD-10-CM | POA: Diagnosis not present

## 2024-03-21 DIAGNOSIS — B029 Zoster without complications: Secondary | ICD-10-CM | POA: Diagnosis not present

## 2024-03-29 ENCOUNTER — Encounter: Payer: Self-pay | Admitting: Adult Health

## 2024-04-11 ENCOUNTER — Ambulatory Visit
Admission: RE | Admit: 2024-04-11 | Discharge: 2024-04-11 | Disposition: A | Discharge status: Home / Self Care | Source: Ambulatory Visit | Attending: Adult Health | Admitting: Adult Health

## 2024-04-11 DIAGNOSIS — Z1231 Encounter for screening mammogram for malignant neoplasm of breast: Secondary | ICD-10-CM | POA: Diagnosis not present

## 2024-05-07 DIAGNOSIS — Z85828 Personal history of other malignant neoplasm of skin: Secondary | ICD-10-CM | POA: Diagnosis not present

## 2024-05-07 DIAGNOSIS — L309 Dermatitis, unspecified: Secondary | ICD-10-CM | POA: Diagnosis not present

## 2024-06-04 ENCOUNTER — Other Ambulatory Visit: Payer: Self-pay | Admitting: Cardiovascular Disease

## 2024-06-20 ENCOUNTER — Ambulatory Visit: Admitting: Adult Health

## 2024-06-20 ENCOUNTER — Ambulatory Visit: Payer: Self-pay

## 2024-06-20 VITALS — BP 120/70 | HR 61 | Temp 98.0°F | Wt 200.0 lb

## 2024-06-20 DIAGNOSIS — M545 Low back pain, unspecified: Secondary | ICD-10-CM

## 2024-06-20 MED ORDER — CYCLOBENZAPRINE HCL 10 MG PO TABS: 10.0000 mg | ORAL_TABLET | Freq: Three times a day (TID) | ORAL | 0 refills | Status: AC | PRN

## 2024-06-20 MED ORDER — METHYLPREDNISOLONE 4 MG PO TBPK: ORAL_TABLET | ORAL | 0 refills | Status: AC

## 2024-06-20 NOTE — Telephone Encounter (Signed)
 Patient hung up prior to transfer to this RN.  This RN attempted to call patient back--no answer---left a voicemail for patient to call back         Copied from CRM #[id number]. Topic: Clinical - Red Word Triage >> Jun 20, 2024  9:00 AM Mia F wrote: Red Word that prompted transfer to Nurse Triage: Severe pain in the back into the hips. Getting up is a struggle and standing is very bad. This has been going on for a few days but this morning has been worse. Medication has not been helping. She did not fall or anything to cause this pain.

## 2024-06-20 NOTE — Telephone Encounter (Signed)
 Copied from CRM #[id number]. Topic: Clinical - Red Word Triage >> Jun 20, 2024  9:00 AM Mia F wrote: Red Word that prompted transfer to Nurse Triage: Severe pain in the back into the hips. Getting up is a struggle and standing is very bad. This has been going on for a few days but this morning has been worse. Medication has not been helping. She did not fall or anything to cause this pain. >> Jun 20, 2024  9:29 AM Revonda D wrote: Pt stated that she was speaking with a nurse and the call disconnected. Pt is calling back to speak with a nurse.

## 2024-06-20 NOTE — Telephone Encounter (Signed)
 FYI Only or Action Required?: FYI only for provider: appointment scheduled on 06/20/24.  Patient was last seen in primary care on 02/29/2024 by Merna Huxley, NP.  Called Nurse Triage reporting Back Pain.  Symptoms began a week ago.  Interventions attempted: OTC medications: ibuprofen .  Symptoms are: unchanged.  Triage Disposition: See Physician Within 24 Hours  Patient/caregiver understands and will follow disposition?: Yes   Reason for Disposition  Numbness in a leg or foot (i.e., loss of sensation)  Answer Assessment - Initial Assessment Questions Offered sooner appt alt prov, patient declined. Scheduled with pcp, 06/20/24.  Advised call back or ED if symptoms worsen. Patient verbalized understanding.  1. ONSET: When did the pain begin? (e.g., minutes, hours, days)     Week ago, worse yesterday 2. LOCATION: Where does it hurt? (upper, mid or lower back)     Lower back pain 3. SEVERITY: How bad is the pain?  (e.g., Scale 1-10; mild, moderate, or severe)   7/10; took Ibuprofen , 1 hr ago,  Standing makes worse, sitting goes away 4. PATTERN: Is the pain constant? (e.g., yes, no; constant, intermittent)      intermittent 5. RADIATION: Does the pain shoot into your legs or somewhere else?     To hips 6. CAUSE:  What do you think is causing the back pain?      no 7. BACK OVERUSE:  Any recent lifting of heavy objects, strenuous work or exercise?     no 8. MEDICINES: What have you taken so far for the pain? (e.g., nothing, acetaminophen , NSAIDS)     ibuprofen  9. NEUROLOGIC SYMPTOMS: Do you have any weakness, numbness, or problems with bowel/bladder control?     no 10. OTHER SYMPTOMS: Do you have any other symptoms? (e.g., fever, abdomen pain, burning with urination, blood in urine)  Denies fever chills n/v/, abd pain, pain urination, weakness/ numbness  Protocols used: Back Pain-A-AH

## 2024-06-20 NOTE — Telephone Encounter (Deleted)
 Copied from CRM #[id number]. Topic: Clinical - Red Word Triage >> Jun 20, 2024  9:00 AM Mia F wrote: Red Word that prompted transfer to Nurse Triage: Severe pain in the back into the hips. Getting up is a struggle and standing is very bad. This has been going on for a few days but this morning has been worse. Medication has not been helping. She did not fall or anything to cause this pain. >> Jun 20, 2024  9:29 AM Revonda D wrote: Pt stated that she was speaking with a nurse and the call disconnected. Pt is calling back to speak with a nurse.

## 2024-06-20 NOTE — Telephone Encounter (Signed)
 Will document on previous triage encounter.

## 2024-06-20 NOTE — Progress Notes (Signed)
 Subjective:    Patient ID: Hannah Morrow, female    DOB: Jun 05, 1941, 83 y.o.   MRN: [id number]  HPI  Discussed the use of AI scribe software for clinical note transcription with the patient, who gave verbal consent to proceed.  History of Present Illness   Hannah Morrow Addison Freimuth is an 83 year old female who presents with severe lower back pain.  She developed severe squeezing lower back pain this morning, localized to the lower back and just above the hips without radiation to the legs. Pain worsens with standing and bending forward and improves with sitting. It is intense enough to take her breath away and prevents her from walking her dog. Ibuprofen  and a hot shower provided partial relief. She stood all day yesterday preparing food, which she feels may have triggered the pain.       Review of Systems See HPI   Past Medical History:  Diagnosis Date   Allergy    no meds   ANXIETY    Cancer (HCC)    cervical cancer - hysterectomy   GERD    Hyperlipidemia    Hypertension    Hypothyroid    Plantar fasciitis    right   RESTLESS LEGS SYNDROME     Social History   Socioeconomic History   Marital status: Widowed    Spouse name: Not on file   Number of children: 2   Years of education: Not on file   Highest education level: Associate degree: occupational, scientist, product/process development, or vocational program  Occupational History   Not on file  Tobacco Use   Smoking status: Never   Smokeless tobacco: Never  Vaping Use   Vaping status: Not on file  Substance and Sexual Activity   Alcohol use: Yes    Alcohol/week: 5.0 standard drinks of alcohol    Types: 5 Glasses of wine per week    Comment: Occasional   Drug use: No   Sexual activity: Yes    Partners: Male    Birth control/protection: Surgical, Other-see comments    Comment: hysterectomy  Other Topics Concern   Not on file  Social History Narrative   Married for 35 years   Two daughters, five grand children and  5 great grand children. Daughter lives in [place]. Other daughter lives in [place].    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    Exercise Does not exercise. Was going to exercise class but has since quite.    Diet: Veggies, chicken, salmon. Very little red meat. Eats lots of eggs.    Exercise --- walking , gym 3 classes a week   Social Drivers of Health   Tobacco Use: Low Risk (06/20/2024)   Patient History    Smoking Tobacco Use: Never    Smokeless Tobacco Use: Never    Passive Exposure: Not on file  Financial Resource Strain: Low Risk (06/20/2024)   Overall Financial Resource Strain (CARDIA)    Difficulty of Paying Living Expenses: Not hard at all  Food Insecurity: No Food Insecurity (06/20/2024)   Epic    Worried About Programme Researcher, Broadcasting/film/video in the Last Year: Never true    Ran Out of Food in the Last Year: Never true  Transportation Needs: No Transportation Needs (06/20/2024)   Epic    Lack of Transportation (Medical): No    Lack of Transportation (Non-Medical): No  Physical Activity: Insufficiently Active (06/20/2024)  Exercise Vital Sign    Days of Exercise per Week: 4 days    Minutes of Exercise per Session: 10 min  Stress: No Stress Concern Present (06/20/2024)   [employer] of Occupational Health - Occupational Stress Questionnaire    Feeling of Stress: Not at all  Social Connections: Moderately Integrated (06/20/2024)   Social Connection and Isolation Panel    Frequency of Communication with Friends and Family: More than three times a week    Frequency of Social Gatherings with Friends and Family: Twice a week    Attends Religious Services: More than 4 times per year    Active Member of [employer] or Organizations: Yes    Attends Banker Meetings: More than 4 times per year    Marital Status: Widowed  Intimate Partner Violence: Not At Risk (01/13/2024)   Epic    Fear of Current or Ex-Partner: No     Emotionally Abused: No    Physically Abused: No    Sexually Abused: No  Depression (PHQ2-9): Low Risk (06/20/2024)   Depression (PHQ2-9)    PHQ-2 Score: 0  Alcohol Screen: Low Risk (06/20/2024)   Alcohol Screen    Last Alcohol Screening Score (AUDIT): 2  Housing: Low Risk (06/20/2024)   Epic    Unable to Pay for Housing in the Last Year: No    Number of Times Moved in the Last Year: 0    Homeless in the Last Year: No  Utilities: Not At Risk (01/13/2024)   Epic    Threatened with loss of utilities: No  Health Literacy: Adequate Health Literacy (01/13/2024)   B1300 Health Literacy    Frequency of need for help with medical instructions: Never    Past Surgical History:  Procedure Laterality Date   ABDOMINAL HYSTERECTOMY     BREAST SURGERY     Bilateral breast reduction   COLOSTOMY  04-22-2010   [facility]   cone bx     cervix   CORONARY ARTERY BYPASS GRAFT N/A 05/28/2019   Procedure: CORONARY ARTERY BYPASS GRAFTING (CABG) using the LIMA to LAD; Endoscopic right saphenous vein harvesting to the Ramus and Diag1.;  Surgeon: Army Dallas NOVAK, MD;  Location[facility] OR;  Service: Open Heart Surgery;  Laterality: N/A;   LEFT HEART CATH AND CORONARY ANGIOGRAPHY N/A 05/25/2019   Procedure: LEFT HEART CATH AND CORONARY ANGIOGRAPHY;  Surgeon: Anner Alm ORN, MD;  Location[facility] INVASIVE CV LAB;  Service: Cardiovascular;  Laterality: N/A;   REDUCTION MAMMAPLASTY Bilateral    TEE WITHOUT CARDIOVERSION N/A 05/28/2019   Procedure: TRANSESOPHAGEAL ECHOCARDIOGRAM (TEE);  Surgeon: Army Dallas NOVAK, MD;  Location: [facility] OR;  Service: Open Heart Surgery;  Laterality: N/A;   WISDOM TOOTH EXTRACTION      Family History  Problem Relation Age of Onset   Cancer Sister        melanoma- with mets   Colon cancer Father 72   Stomach cancer Maternal Grandfather    Breast cancer Mother 41   Breast cancer Daughter 48   Rectal cancer Neg Hx    Esophageal cancer Neg Hx     Allergies[1]  Medications Ordered  Prior to Encounter[2]  BP 120/70   Pulse 61   Temp 98 F (36.7 C) (Tympanic)   Wt 200 lb (90.7 kg)   SpO2 98%   BMI 36.58 kg/m       Objective:   Physical Exam Vitals and nursing note reviewed.  Constitutional:      Appearance: Normal appearance.  Musculoskeletal:        General: Tenderness present.     Lumbar back: Tenderness present. No swelling, deformity or bony tenderness. Decreased range of motion.       Back:  Neurological:     General: No focal deficit present.     Mental Status: She is alert and oriented to person, place, and time.  Psychiatric:        Mood and Affect: Mood normal.        Behavior: Behavior normal.        Thought Content: Thought content normal.        Judgment: Judgment normal.        Assessment & Plan:  Assessment and Plan    Acute low back pain Likely muscular strain from prolonged standing. No leg radiation or spinal tenderness, indicating muscular origin. - Prescribed muscle relaxer at bedtime. - Prescribed Medrol  Dosepak for inflammation, start today if picked up. - Advised heating pad for relief. - Continue ibuprofen  2-3 times daily as needed. - Follow up if not improving in the next 2-3 days           [1]  Allergies Allergen Reactions   Penicillins Shortness Of Breath    red face Did it involve swelling of the face/tongue/throat, SOB, or low BP? Yes Did it involve sudden or severe rash/hives, skin peeling, or any reaction on the inside of your mouth or nose? Yes Did you need to seek medical attention at a hospital or doctor's office? Yes When did it last happen?      40+ years If all above answers are NO, may proceed with cephalosporin use.    Coreg  [Carvedilol ] Rash   Amlodipine      Pt states it made her feel awful and weird.   Metoprolol  Other (See Comments)     feel weird   Betadine [Povidone Iodine] Rash   Hydrochlorothiazide  Other (See Comments)    Fatigue    Hydrocodone-Acetaminophen  Rash    ras    Lisinopril      Fatigue    Tramadol  Itching and Rash  [2]  Current Outpatient Medications on File Prior to Visit  Medication Sig Dispense Refill   Ascorbic Acid (VITAMIN C) 1000 MG tablet Take 1,000 mg by mouth daily.     aspirin  81 MG tablet Take 1 tablet (81 mg total) by mouth daily.     atenolol  (TENORMIN ) 50 MG tablet TAKE 1 TABLET DAILY (TO REPLACE 25 MG DOSE) 90 tablet 3   atorvastatin  (LIPITOR ) 40 MG tablet TAKE 1 TABLET DAILY 90 tablet 3   Cholecalciferol (VITAMIN D ) 2000 UNITS CAPS Take by mouth.     doxycycline  (VIBRAMYCIN ) 100 MG capsule Take 1 capsule (100 mg total) by mouth 2 (two) times daily. 14 capsule 0   fluocinonide  ointment (LIDEX ) 0.05 % Apply 1 application topically daily as needed (itching). 60 g 1   ibuprofen  (ADVIL ) 200 MG tablet Take 400 mg by mouth in the morning and at bedtime.     levothyroxine  (SYNTHROID ) 125 MCG tablet Take 1 tablet (125 mcg total) by mouth daily. 90 tablet 3   losartan  (COZAAR ) 100 MG tablet TAKE 1 TABLET DAILY 90 tablet 3   No current facility-administered medications on file prior to visit.

## 2024-07-03 ENCOUNTER — Telehealth: Payer: Self-pay | Admitting: Cardiovascular Disease

## 2024-07-03 ENCOUNTER — Ambulatory Visit: Payer: Self-pay

## 2024-07-03 NOTE — Telephone Encounter (Signed)
" ° °  Pt c/o of Chest Pain: STAT if active CP, including tightness, pressure, jaw pain, radiating pain to shoulder/upper arm/back, CP unrelieved by Nitro. Symptoms reported of SOB, nausea, vomiting, sweating.  1. Are you having CP right now? No     2. Are you experiencing any other symptoms (ex. SOB, nausea, vomiting, sweating)? No    3. Is your CP continuous or coming and going? Coming and going    4. Have you taken Nitroglycerin ? NO    5. How long have you been experiencing CP? Since 12/26.   Pt calling about weird chest pain. It is coming and going, and feels like a tight squeeze of discomfort around her ribs and into her back. Pt states it is painful and scary, but if she stretches her arms out it eases. Please advise.  "

## 2024-07-03 NOTE — Telephone Encounter (Signed)
 Called [facility] and advised Hannah Morrow of patient's ER Refusal at this time.

## 2024-07-03 NOTE — Telephone Encounter (Signed)
 FYI Only or Action Required?: Action required by provider: request for appointment and ER Refusal at this time.  Patient was last seen in primary care on 06/20/2024 by Merna Huxley, NP.  Called Nurse Triage reporting Chest Pain.  Symptoms began 06/29/2024 off and on.  Interventions attempted: Rest, hydration, or home remedies.  Symptoms are: off and on.  Triage Disposition: Go to ED Now (Notify PCP)  Patient/caregiver understands and will follow disposition?: No, wishes to speak with PCP               Copied from CRM [id number]. Topic: Clinical - Red Word Triage >> Jul 03, 2024  9:12 AM Kevelyn M wrote: Red Word that prompted transfer to Nurse Triage: 'Weird' Chest pain off and on for the last few days. Reason for Disposition  Long-distance travel in past month (e.g., car, bus, train, plane; with trip lasting 6 or more hours)  Answer Assessment - Initial Assessment Questions Chest pain that radiated around to both sides that started 06/29/2024 and it was off and on that first day.  Pain went away completely and it happened a few times the next day.  Last night, patient states that it happened again. Patient states that if she stands and stretches back it helps. She thought she felt it happening this morning. Patient denies shortness of breath, arm pain, and states her blood pressure was normal 127/61 on 06/30/2024. She denies coughing up blood or any injuries. Patient did travel to [place]  and back on the 3rd of December. Patient states at least six hours on a plane. Patient does have a cardiac history. Due to the patient's cardiac history of presenting symptoms---ER is the recommended disposition at this time. Patient is advised that the Emergency Room is recommended at this time. Patient states she does not want to go to the Emergency Room due to the waiting.  She is advised that this is recommended for what she is experiencing. She still declines and states that she  is going to call her Cardiologist at this time for further advice. She is advised to call us  back with any further questions/concerns or if anything changes.  She is also advised to call 911 if her symptoms worsen.  She verbalized understanding.  Protocols used: Chest Pain-A-AH

## 2024-07-03 NOTE — Telephone Encounter (Signed)
 Spoke to pt and she stated that she scheduled an appt. With Cardio for tomorrow. No further action needed.

## 2024-07-03 NOTE — Telephone Encounter (Signed)
 Patient reports starting on 06/29/24 she has experienced intermittent tightness around the sides of her ribs and around to her back. She states it is brought on when she is hunched over with her shoulders together, and feels better when she sit ups and stretches back.  She denies any chest pain, radiating pain to arm/neck/jaw, or SOB.   Patient states she has been more active over the holidays with baking and reaching overhead while decorating. Discussed with patient this sounds more musculoskeletal than cardiac. Advised on ED precautions, patient verbalized understanding.  Patient is due for 1 year follow-up, offered appt available for tomorrow with APP, patient accepted appt.

## 2024-07-04 ENCOUNTER — Ambulatory Visit: Attending: Cardiology | Admitting: Cardiology

## 2024-07-04 ENCOUNTER — Encounter: Payer: Self-pay | Admitting: Adult Health

## 2024-07-04 ENCOUNTER — Encounter: Payer: Self-pay | Admitting: Cardiology

## 2024-07-04 VITALS — BP 124/78 | HR 66 | Ht 62.0 in | Wt 200.4 lb

## 2024-07-04 DIAGNOSIS — R0789 Other chest pain: Secondary | ICD-10-CM | POA: Diagnosis not present

## 2024-07-04 DIAGNOSIS — E785 Hyperlipidemia, unspecified: Secondary | ICD-10-CM | POA: Insufficient documentation

## 2024-07-04 DIAGNOSIS — Z951 Presence of aortocoronary bypass graft: Secondary | ICD-10-CM | POA: Insufficient documentation

## 2024-07-04 DIAGNOSIS — I10 Essential (primary) hypertension: Secondary | ICD-10-CM | POA: Insufficient documentation

## 2024-07-04 NOTE — Progress Notes (Signed)
 " Cardiology Office Note:  .   Date:  07/04/2024  ID:  Hannah Morrow, DOB August 27, 1940, MRN [id number] PCP: Hannah Huxley, NP  [facility] Providers Cardiologist:  Dorn Lesches, MD {  History of Present Illness: .   Hannah Morrow is a 83 y.o. female with history of CAD status post CABG x 3 05/2019, hypertension, hyperlipidemia, hypothyroidism.     CAD 05/2019 EKG with T wave inversions, admitted for cardiac catheterization with MVD. Status post CABG LIMA to LAD, vein to ramus branch and diagonal branch.  EF normal.  Social history  Retired Radiographer, Therapeutic Former smoker, quit 1976. Rarely drinks.  No alcohol use Walking dog for activity.    Patient with history of CABG in 2020.  Has been well since then without any further ischemic evaluations.  Last seen 06/2023 with no chest pain, reported tough time time of year as she lives alone.  Patient recently contacted our office reporting intermittent chest pain around the ribs and around her back when she hunches over and when she sits up.  Of note she is also seeing her PCP for lower back pain as well.  Today patient presents for annual follow-up and evaluation for her chest pain.  She reports positional chest pain that she says feels like a burning sensation whenever she hunches over but as soon as she opens up her chest or stands up the pain instantly goes away and only will last for a few seconds at any given time.  Reported 1 episode during Christmas and then again on the 29th but self resolving.  Her daughter was very worried about her so convinced her to call cardiology as soon as possible.  She reports the sensation that she has is completely different than her pain that she had when she had CABG.  She reports that she will walk the dog and walking with no exertional complaints otherwise and no chest pain during this activity.  ROS: Denies: shortness of breath, orthopnea, peripheral edema, palpitations,  syncope, decreased exercise capacity, fatigue, dizziness.   Studies Reviewed: SABRA    EKG Interpretation Date/Time:  Wednesday July 04 2024 10:24:46 EST Ventricular Rate:  66 PR Interval:  198 QRS Duration:  84 QT Interval:  398 QTC Calculation: 417 R Axis:   -25  Text Interpretation: Normal sinus rhythm Minimal voltage criteria for LVH, may be normal variant ( R in aVL ) When compared with ECG of 27-Jun-2023 10:18, Borderline criteria for Anterolateral infarct are no longer Present Confirmed by Darryle Currier [phone]) on 07/04/2024 10:45:14 AM    Risk Assessment/Calculations:           Physical Exam:   VS:  BP 124/78   Pulse 66   Ht 5' 2 (1.575 m)   Wt 200 lb 6.4 oz (90.9 kg)   SpO2 96%   BMI 36.65 kg/m    Wt Readings from Last 3 Encounters:  07/04/24 200 lb 6.4 oz (90.9 kg)  06/20/24 200 lb (90.7 kg)  02/29/24 201 lb (91.2 kg)    GEN: Well nourished, well developed in no acute distress NECK: No JVD; No carotid bruits CARDIAC: RRR, no murmurs, rubs, gallops RESPIRATORY:  Clear to auscultation without rales, wheezing or rhonchi  ABDOMEN: Soft, non-tender, non-distended EXTREMITIES:  No edema; No deformity   ASSESSMENT AND PLAN: .    Chest pain She is describing positional, nonradiating, nonexertional, burning chest pain that wraps around her back.  Presentation seems to be noncardiac related pains.  EKG with no acute ST-T wave changes.  Her symptoms are self-limiting and point to possible MSK related pains given positional component.  Offered repeating stress testing since it has been 2020 since her CABG but we both agree we can hold off for the time being and she will call should something change.  CAD status post CABG x 3 2020 Hyperlipidemia -LIMA to her LAD, vein to a ramus branch and diagonal branch  Stable disease, I do not think her complaints today are related to cardiac system. Continue with aspirin , atenolol  50 mg, atorvastatin  40 mg. She takes decent dose of  ibuprofen  400 mg in the morning and at bedtime for back related pain.  In the setting of aspirin  would encourage discontinuation and alternative.  Referred to PCP LDL 07/2023 at 73.  Goal less than 70 per Dr. Court.  Hypertension Good control 124/78 today.  Continue losartan  100 mg daily.    Dispo: Follow-up 1 year  Signed, Thom LITTIE Sluder, PA-C  "

## 2024-07-04 NOTE — Patient Instructions (Signed)
 Medication Instructions:  Your physician recommends that you continue on your current medications as directed. Please refer to the Current Medication list given to you today.  *If you need a refill on your cardiac medications before your next appointment, please call your pharmacy*  Lab Work: None  If you have labs (blood work) drawn today and your tests are completely normal, you will receive your results only by: MyChart Message (if you have MyChart) OR A paper copy in the mail If you have any lab test that is abnormal or we need to change your treatment, we will call you to review the results.  Testing/Procedures: None  Follow-Up: At [facility], you and your health needs are our priority.  As part of our continuing mission to provide you with exceptional heart care, our providers are all part of one team.  This team includes your primary Cardiologist (physician) and Advanced Practice Providers or APPs (Physician Assistants and Nurse Practitioners) who all work together to provide you with the care you need, when you need it.  Your next appointment:   1 year  Provider:   Dorn Lesches, MD    We recommend signing up for the patient portal called MyChart.  Sign up information is provided on this After Visit Summary.  MyChart is used to connect with patients for Virtual Visits (Telemedicine).  Patients are able to view lab/test results, encounter notes, upcoming appointments, etc.  Non-urgent messages can be sent to your provider as well.   To learn more about what you can do with MyChart, go to [email or web address].   Other Instructions Please follow-up with your primary care provider about the Ibuprofen .

## 2024-07-06 NOTE — Telephone Encounter (Signed)
 Please advise

## 2024-07-10 DIAGNOSIS — E039 Hypothyroidism, unspecified: Secondary | ICD-10-CM

## 2025-01-18 ENCOUNTER — Ambulatory Visit
